# Patient Record
Sex: Male | Born: 1940
Health system: Southern US, Community
[De-identification: ages and names within clinical notes are randomized; demographics above are authoritative.]

## PROBLEM LIST (undated history)

## (undated) ENCOUNTER — Emergency Department (HOSPITAL_COMMUNITY): Admission: EM | Payer: Medicare Other

## (undated) DIAGNOSIS — I252 Old myocardial infarction: Secondary | ICD-10-CM

## (undated) DIAGNOSIS — I10 Essential (primary) hypertension: Secondary | ICD-10-CM

## (undated) DIAGNOSIS — R911 Solitary pulmonary nodule: Secondary | ICD-10-CM

## (undated) DIAGNOSIS — E785 Hyperlipidemia, unspecified: Secondary | ICD-10-CM

## (undated) DIAGNOSIS — K219 Gastro-esophageal reflux disease without esophagitis: Secondary | ICD-10-CM

## (undated) DIAGNOSIS — Z85820 Personal history of malignant melanoma of skin: Secondary | ICD-10-CM

## (undated) DIAGNOSIS — Z87440 Personal history of urinary (tract) infections: Secondary | ICD-10-CM

## (undated) DIAGNOSIS — N419 Inflammatory disease of prostate, unspecified: Secondary | ICD-10-CM

## (undated) DIAGNOSIS — M19049 Primary osteoarthritis, unspecified hand: Secondary | ICD-10-CM

## (undated) DIAGNOSIS — Z9289 Personal history of other medical treatment: Secondary | ICD-10-CM

## (undated) DIAGNOSIS — I219 Acute myocardial infarction, unspecified: Secondary | ICD-10-CM

## (undated) DIAGNOSIS — C4490 Unspecified malignant neoplasm of skin, unspecified: Secondary | ICD-10-CM

## (undated) DIAGNOSIS — IMO0002 Reserved for concepts with insufficient information to code with codable children: Secondary | ICD-10-CM

## (undated) DIAGNOSIS — Z8489 Family history of other specified conditions: Secondary | ICD-10-CM

## (undated) DIAGNOSIS — Z85828 Personal history of other malignant neoplasm of skin: Secondary | ICD-10-CM

## (undated) DIAGNOSIS — Z87442 Personal history of urinary calculi: Secondary | ICD-10-CM

## (undated) DIAGNOSIS — Z9889 Other specified postprocedural states: Secondary | ICD-10-CM

## (undated) DIAGNOSIS — N529 Male erectile dysfunction, unspecified: Secondary | ICD-10-CM

## (undated) DIAGNOSIS — R51 Headache: Secondary | ICD-10-CM

## (undated) DIAGNOSIS — G4733 Obstructive sleep apnea (adult) (pediatric): Secondary | ICD-10-CM

## (undated) DIAGNOSIS — R519 Headache, unspecified: Secondary | ICD-10-CM

## (undated) DIAGNOSIS — R112 Nausea with vomiting, unspecified: Secondary | ICD-10-CM

## (undated) DIAGNOSIS — N1330 Unspecified hydronephrosis: Secondary | ICD-10-CM

## (undated) DIAGNOSIS — Z973 Presence of spectacles and contact lenses: Secondary | ICD-10-CM

## (undated) DIAGNOSIS — Z87448 Personal history of other diseases of urinary system: Secondary | ICD-10-CM

## (undated) DIAGNOSIS — R319 Hematuria, unspecified: Secondary | ICD-10-CM

## (undated) DIAGNOSIS — I2510 Atherosclerotic heart disease of native coronary artery without angina pectoris: Secondary | ICD-10-CM

## (undated) DIAGNOSIS — D72819 Decreased white blood cell count, unspecified: Secondary | ICD-10-CM

## (undated) DIAGNOSIS — G8929 Other chronic pain: Secondary | ICD-10-CM

## (undated) DIAGNOSIS — N400 Enlarged prostate without lower urinary tract symptoms: Secondary | ICD-10-CM

## (undated) DIAGNOSIS — G4700 Insomnia, unspecified: Secondary | ICD-10-CM

## (undated) DIAGNOSIS — J309 Allergic rhinitis, unspecified: Secondary | ICD-10-CM

## (undated) DIAGNOSIS — Z789 Other specified health status: Secondary | ICD-10-CM

## (undated) DIAGNOSIS — Z87438 Personal history of other diseases of male genital organs: Secondary | ICD-10-CM

## (undated) DIAGNOSIS — R040 Epistaxis: Secondary | ICD-10-CM

## (undated) HISTORY — DX: Solitary pulmonary nodule: R91.1

## (undated) HISTORY — PX: SEPTOPLASTY WITH ETHMOIDECTOMY, AND MAXILLARY ANTROSTOMY: SHX6090

## (undated) HISTORY — PX: SKIN CANCER EXCISION: SHX779

## (undated) HISTORY — DX: Hyperlipidemia, unspecified: E78.5

## (undated) HISTORY — DX: Male erectile dysfunction, unspecified: N52.9

## (undated) HISTORY — PX: NASAL SINUS SURGERY: SHX719

## (undated) HISTORY — DX: Epistaxis: R04.0

## (undated) HISTORY — PX: KNEE ARTHROSCOPY: SHX127

## (undated) HISTORY — DX: Obstructive sleep apnea (adult) (pediatric): G47.33

## (undated) HISTORY — DX: Personal history of other medical treatment: Z92.89

## (undated) HISTORY — PX: FRONTAL SINUSOTOMY: SUR1296

## (undated) HISTORY — DX: Decreased white blood cell count, unspecified: D72.819

## (undated) HISTORY — PX: BONE CYST EXCISION: SHX376

## (undated) HISTORY — DX: Inflammatory disease of prostate, unspecified: N41.9

## (undated) HISTORY — DX: Primary osteoarthritis, unspecified hand: M19.049

## (undated) HISTORY — DX: Insomnia, unspecified: G47.00

## (undated) HISTORY — DX: Reserved for concepts with insufficient information to code with codable children: IMO0002

## (undated) HISTORY — DX: Allergic rhinitis, unspecified: J30.9

## (undated) HISTORY — DX: Atherosclerotic heart disease of native coronary artery without angina pectoris: I25.10

## (undated) HISTORY — PX: EYE SURGERY: SHX253

---

## 2001-07-08 ENCOUNTER — Encounter: Admission: RE | Admit: 2001-07-08 | Discharge: 2001-07-08 | Payer: Self-pay | Admitting: Rheumatology

## 2001-07-08 ENCOUNTER — Encounter: Payer: Self-pay | Admitting: Rheumatology

## 2001-11-01 ENCOUNTER — Ambulatory Visit (HOSPITAL_COMMUNITY): Admission: RE | Admit: 2001-11-01 | Discharge: 2001-11-01 | Payer: Self-pay | Admitting: Gastroenterology

## 2002-03-21 ENCOUNTER — Encounter: Admission: RE | Admit: 2002-03-21 | Discharge: 2002-03-21 | Payer: Self-pay | Admitting: Rheumatology

## 2002-03-21 ENCOUNTER — Encounter: Payer: Self-pay | Admitting: Rheumatology

## 2002-04-28 ENCOUNTER — Encounter: Admission: RE | Admit: 2002-04-28 | Discharge: 2002-04-28 | Payer: Self-pay | Admitting: Rheumatology

## 2002-04-28 ENCOUNTER — Encounter: Payer: Self-pay | Admitting: Rheumatology

## 2002-05-12 ENCOUNTER — Encounter: Payer: Self-pay | Admitting: Otolaryngology

## 2002-05-12 ENCOUNTER — Encounter: Admission: RE | Admit: 2002-05-12 | Discharge: 2002-05-12 | Payer: Self-pay | Admitting: Otolaryngology

## 2002-06-03 ENCOUNTER — Encounter: Payer: Self-pay | Admitting: Otolaryngology

## 2002-06-03 ENCOUNTER — Encounter: Admission: RE | Admit: 2002-06-03 | Discharge: 2002-06-03 | Payer: Self-pay | Admitting: Otolaryngology

## 2002-09-22 ENCOUNTER — Ambulatory Visit (HOSPITAL_BASED_OUTPATIENT_CLINIC_OR_DEPARTMENT_OTHER): Admission: RE | Admit: 2002-09-22 | Discharge: 2002-09-22 | Payer: Self-pay | Admitting: Otolaryngology

## 2002-09-22 ENCOUNTER — Encounter (INDEPENDENT_AMBULATORY_CARE_PROVIDER_SITE_OTHER): Payer: Self-pay | Admitting: Specialist

## 2003-04-09 ENCOUNTER — Ambulatory Visit (HOSPITAL_COMMUNITY): Admission: RE | Admit: 2003-04-09 | Discharge: 2003-04-09 | Payer: Self-pay | Admitting: Neurology

## 2003-04-09 ENCOUNTER — Encounter: Payer: Self-pay | Admitting: Neurology

## 2003-07-09 ENCOUNTER — Encounter: Admission: RE | Admit: 2003-07-09 | Discharge: 2003-07-09 | Payer: Self-pay | Admitting: Internal Medicine

## 2003-07-21 ENCOUNTER — Encounter: Admission: RE | Admit: 2003-07-21 | Discharge: 2003-07-21 | Payer: Self-pay | Admitting: Colon and Rectal Surgery

## 2003-07-21 ENCOUNTER — Encounter: Payer: Self-pay | Admitting: Otolaryngology

## 2003-07-23 ENCOUNTER — Encounter (INDEPENDENT_AMBULATORY_CARE_PROVIDER_SITE_OTHER): Payer: Self-pay | Admitting: Specialist

## 2003-07-23 ENCOUNTER — Ambulatory Visit (HOSPITAL_BASED_OUTPATIENT_CLINIC_OR_DEPARTMENT_OTHER): Admission: RE | Admit: 2003-07-23 | Discharge: 2003-07-23 | Payer: Self-pay | Admitting: Otolaryngology

## 2010-05-02 ENCOUNTER — Encounter: Admission: RE | Admit: 2010-05-02 | Discharge: 2010-05-02 | Payer: Self-pay | Admitting: Internal Medicine

## 2011-04-07 NOTE — Op Note (Signed)
NAME:  Sean Hammond, Sean Hammond                            ACCOUNT NO.:  [id number]   MEDICAL RECORD NO.:  [id number]                   PATIENT TYPE:  AMB   LOCATION:  DSC                                  FACILITY:  [facility]   PHYSICIAN:  Jefry H. Pollyann Kennedy, M.D.                DATE OF BIRTH:  [birth year]   DATE OF PROCEDURE:  09/22/2002  DATE OF DISCHARGE:                                 OPERATIVE REPORT   PREOPERATIVE DIAGNOSES:  1. Nasal septal deviation.  2. Chronic ethmoid sinusitis.  3. Chronic maxillary sinusitis.  4. Chronic sinus polyposis.  5. Chronic frontal sinusitis.   POSTOPERATIVE DIAGNOSES:  1. Nasal septal deviation.  2. Chronic ethmoid sinusitis.  3. Chronic maxillary sinusitis.  4. Chronic sinus polyposis.  5. Chronic frontal sinusitis.   OPERATION PERFORMED:  1. Nasal septoplasty.  2. Bilateral endoscopic total ethmoidectomy.  3. Bilateral endoscopic maxillary antrostomy with stripping of maxillary and     sinus mucosa.  4. InstaTrak image guidance system use.  5. Bilateral endoscopic frontal sinusotomy.   SURGEON:  Jefry H. Pollyann Kennedy, M.D.   ANESTHESIA:  General endotracheal.   COMPLICATIONS:  None.   ESTIMATED BLOOD LOSS:  100 cc.   FINDINGS:  Severe nasal septal deviation towards the left side involving the  inferior aspect of the ethmoid plate and the vomerian bone.  Chronic diffuse  ethmoid sinus disease with hyperplastic mucosal changes, polyps and thick  mucopurulent secretions.  Hypoplastic mucosal sinus disease at the maxillary  sinuses bilaterally and again with thickened and hyperplastic and polypoid  changes and mucopurulent secretions.   INDICATIONS FOR PROCEDURE:  The patient is a 70 year old gentleman with an  extensive history of chronic sinus disease not responding to prolonged  antibiotic therapy.  The risks, benefits, alternatives and complications of  the procedure were explained to the patient, who  seemed to understand and  agreed to  surgery.   DESCRIPTION OF PROCEDURE:  The patient was taken to the operating room and  placed on the operating table in supine position.  Following induction of  general endotracheal anesthesia, the face was prepped and draped in standard  fashion.  Afrin spray was used preoperatively in the nasal cavities.  1%  Xylocaine with epinephrine was infiltrated into the septum, the columella  and the superior and posterior attachments of the middle turbinates  bilaterally.  A total of approximately 8 cc was injected.  The InstaTrak  head gear was placed into position and the patient was draped and positioned  for nasal and sinus surgery.   1 - Nasal septoplasty.  Afrin soaked pledgets were placed bilaterally in the  nasal cavities.  The 15 scalpel was used to create a left hemitransfixion  incision which was then used to elevate the mucoperichondrial flaps off the  septal cartilage.  This was dissected all the way back to the sphenoid  rostrum and then  the bony cartilaginous junction was divided.  A similar  flap was developed on the right side.  A tear occurred in the left mucosal  flap but the right side was kept intact.  The superior attachment of the  ethmoid plate was taken down using Jansen-Middleton rongeur.  The ethmoid  plate was thickened and deflected towards the left side.  The majority of  the ethmoid plate was resected.  Part of the vomer that was also deflected  was resected.  The remainder was kept intact.  This nicely improved the  nasal airways bilaterally.  The septal incision was reapproximated with 4-0  chromic suture.  The mucosal flaps were quilted with plain gut.   2 - Bilateral endoscopic total ethmoidectomy.  Using a combination of 0 and  30 degree nasal endoscopes and Insta Track for assistance with guidance and  localization, the uncinate process was taken down with a sickle knife. The  ethmoid bulla was exposed bilaterally and was taken down using the straight   suction and the microdebrider.  The microdebrider was then used to perform a  complete ethmoidectomy.  The lamina papyracea was the lateral limit of  dissection and was kept intact.  The ethmoid roof and fovea ethmoidalis was  the superior limit and was also kept intact without any dehiscences.  A  large amount of hyperplastic mucosa and some polypoid disease was removed  during this.  The ground lamella was taken down to enter into the posterior  ethmoid cells.  A complete ethmoidectomy was performed including the  hyperplastic tissue in the frontal recess area.   3 - Bilateral endoscopic maxillary antrostomy with removal of maxillary  sinus mucosa.  Using 30 degree nasal endoscope and curved suction, the  fontanelle was inspected bilaterally.  There was no natural ostium  identified.  Suction was entered in through the fontanelle into the sinus  and a large amount of secretions and polypoid mucosa were identified.  The  ostium was enlarged with backbiting forceps and through cut forceps.  Hyperplastic mucosa was removed bilaterally using upbiting Wild- Blakesley  forceps.  The maxillary sinuses were then packed with Cortisporin ointment.   4 - Bilateral endoscopic frontal sinusotomy.  Following the anterior aspect  of the ethmoidectomy a curved suction was used to enter into the frontal  recess area bilaterally and into the frontal sinus.  The sinuses were filled  with inspissated mucus and the ostia were obstructed by hyperplastic and  polypoid mucosa.  Some of the mucosa was removed but some was left in place  in order to not strip the entire circumferential lining of the duct area.  The frontal sinuses were then packed with Cortisporin ointment.   5 - Land system.  Due to the extensive nature of the  polypoid disease and the need to open up the frontal sinuses bilaterally, the Insta Track was helpful and necessary for visualization of the roof of  the ethmoid  and the entrance into the frontal sinuses.  Also this was of  great help with the posterior aspect of the ethmoidectomy because of  hyperplastic changes that made it difficult to tell for sure where the  posterior cells were versus the roof of the ethmoid.  Bilateral Kennedy  packs were placed in the ethmoid cavities.  The nasal cavities were packed  with rolled up Telfa and coated with bacitracin ointment.  The pharynx was  suctioned of blood and secretions.  The patient was then awakened,  extubated  and transferred to recovery in good condition.                                                Jefry H. Pollyann Kennedy, M.D.   JHR/MEDQ  D:  09/22/2002  T:  09/22/2002  Job:  [id number]

## 2011-04-07 NOTE — Procedures (Signed)
[place]  Patient:    Sean Hammond, Sean Hammond Visit Number: [id number] MRN: [id number]          Service Type: Attending:  Verlin Grills, M.D. Dictated by:   Verlin Grills, M.D. Proc. Date: 11/01/01   CC:         Demetria Pore. Coral Spikes, M.D.   Procedure Report  REFERRING PHYSICIAN:  Demetria Pore. Coral Spikes, M.D.  PROCEDURE INDICATION:  Mr. Sean Hammond (date of birth 01-21-1941) is a 70 year old male, who is referred for diagnostic colonoscopy with polypectomy to evaluate intermittent painless hematochezia.  ENDOSCOPIST:  Charolett Bumpers, M.D.  PREMEDICATION:  Versed 10 mg, Demerol 50 mg.  ENDOSCOPE:  Olympus pediatric colonoscope.  PROCEDURE:  After obtaining informed consent, Mr. Oscar was placed in the left lateral decubitus position.  I administered intravenous Demerol and intravenous Versed to achieve conscious sedation for the procedure.  The patients blood pressure, oxygen saturation and cardiac rhythm were monitored throughout the procedure and documented in the medical record.  Anal inspection was normal.  Digital rectal examination revealed a nonnodular prostate.  The Olympus pediatric video colonoscope was introduced into the rectum and easily advanced to the cecum.  Colonic preparation for the examination today was excellent.  RECTUM:  Normal.  SIGMOID COLON/DESCENDING COLON:  Normal.  SPLENIC FLEXURE:  Normal.  TRANSVERSE COLON:  Normal.  HEPATIC FLEXURE:  Normal.  ASCENDING COLON:  Normal.  CECUM/ILEOCECAL VALVE:  Normal.  ASSESSMENT:  Normal proctocolonoscopy to the cecum.  No endoscopic evidence for the presence of colorectal neoplasia. Dictated by:   Verlin Grills, M.D. Attending:  Verlin Grills, M.D. DD:  11/01/01 TD:  11/01/01 Job: [id number]

## 2011-04-07 NOTE — Op Note (Signed)
NAME:  Sean Hammond, Sean Hammond                            ACCOUNT NO.:  [id number]   MEDICAL RECORD NO.:  [id number]                   PATIENT TYPE:  AMB   LOCATION:  DSC                                  FACILITY:  [facility]   PHYSICIAN:  Jefry H. Pollyann Kennedy, M.D.                DATE OF BIRTH:  [birth year]   DATE OF PROCEDURE:  07/23/2003  DATE OF DISCHARGE:                                 OPERATIVE REPORT   PREOPERATIVE DIAGNOSIS:  Chronic frontal sinusitis.   POSTOPERATIVE DIAGNOSIS:  Chronic frontal sinusitis.   OPERATION PERFORMED:  Revision endoscopic bilateral frontal sinusotomy.   SURGEON:  Jefry H. Pollyann Kennedy, M.D.   ANESTHESIA:  General endotracheal.   COMPLICATIONS:  None.   ESTIMATED BLOOD LOSS:  30mL.   OPERATIVE FINDINGS:  Lateralized middle turbinates bilaterally, minimal to  moderate polypoid tissue in the frontal recess bilaterally.   DISPOSITION:  The patient tolerated the procedure well, was awakened,  extubated and transferred to recovery in stable condition.   INDICATIONS FOR PROCEDURE:  The patient is a 70 year old gentleman who  underwent extensive endoscopic sinus surgery several months back for chronic  sinus disease.  He has had persistent chronic bronchitis and chronic frontal  sinus disease since the surgery.  He has been on multiple antibiotics,  multiple other medications for hyperactive airway disease with moderate  control of symptoms.  The risks, benefits, alternatives and complications of  the procedure were explained to the patient, who seemed to understand and  agreed to surgery.   DESCRIPTION OF PROCEDURE:  The patient was taken to the operating room and  placed on the operating table in supine position.  Following induction of  general endotracheal anesthesia, the patient was prepped and draped in  standard fashion.  Oxymetazoline spray was used preoperatively in the nasal  cavities.  1% Xylocaine with epinephrine was infiltrated into the superior  and  posterior attachment to the middle turbinate and the lateral nasal wall.  A total of about 8mL was injected.   (1)  Bilateral endoscopic middle turbinate resection.  Using 0 degree nasal  endoscopes, the straight through-cut forcep was used to resect the anterior  and inferior aspect of the middle turbinates bilaterally.  There was minimal  bleeding. This opened up nicely the ethmoid complex and the frontal recess  area.  The remnant was left intact for possible future necessity if he  should require further surgery.  (2)  Revision frontal sinusotomy.  Using InstaTrak guidance, the 30 and 70  degree nasal endoscopes were used to visualize the frontal recess area.  The  microdebrider was used to remove any synechiae or polypoid tissue that was  present in the frontal recess and the anterior ethmoid complex bilaterally.  There was no infection noted.  Minimal polypoid disease.  There was no  purulent secretions identified.  The frontal sinuses were opened widely  bilaterally.  A  curved microdebrider tip was also used to clean out the  inferior frontal sinuses bilaterally.  The frontal sinuses were packed with  Cortisporin ointment bilaterally.  (3) InstaTrak image guided system use.  Given the revision nature and the  frontal sinus disease, the decision was made preoperatively to utilize  [employer] guidance system use.  At the beginning of the procedure, the  head gear was placed in position.  The straight suction probe was calibrated  and positioning was verified.  This was of significant utility in safely  identifying the opening to the frontal sinuses and to make sure there was no  persistent disease in addition to the frontal recess bilaterally.  It also  helped with making sure that the frontal sinus was completely open on both  sides.  The pharynx was suctioned of blood and secretions.  The patient was  awakened, extubated and transferred to recovery.                                                  Jefry H. Pollyann Kennedy, M.D.    JHR/MEDQ  D:  07/23/2003  T:  07/23/2003  Job:  [id number]   cc:   Charlaine Dalton. Sherene Sires, M.D. [facility]   Demetria Pore. Coral Spikes, M.D.  [address]  [address]  [place]  [place] [address]  Fax: [phone]

## 2011-04-26 ENCOUNTER — Other Ambulatory Visit: Payer: Self-pay | Admitting: Internal Medicine

## 2011-04-26 DIAGNOSIS — R0789 Other chest pain: Secondary | ICD-10-CM

## 2011-04-28 ENCOUNTER — Ambulatory Visit
Admission: RE | Admit: 2011-04-28 | Discharge: 2011-04-28 | Disposition: A | Discharge status: Home / Self Care | Payer: Medicare Other | Source: Ambulatory Visit | Attending: Internal Medicine | Admitting: Internal Medicine

## 2011-04-28 DIAGNOSIS — R0789 Other chest pain: Secondary | ICD-10-CM

## 2011-09-07 ENCOUNTER — Other Ambulatory Visit: Payer: Self-pay | Admitting: Internal Medicine

## 2011-09-07 DIAGNOSIS — D386 Neoplasm of uncertain behavior of respiratory organ, unspecified: Secondary | ICD-10-CM

## 2011-09-12 ENCOUNTER — Ambulatory Visit
Admission: RE | Admit: 2011-09-12 | Discharge: 2011-09-12 | Disposition: A | Discharge status: Home / Self Care | Payer: Medicare Other | Source: Ambulatory Visit | Attending: Internal Medicine | Admitting: Internal Medicine

## 2011-09-12 DIAGNOSIS — D386 Neoplasm of uncertain behavior of respiratory organ, unspecified: Secondary | ICD-10-CM

## 2011-09-12 MED ORDER — IOHEXOL 300 MG/ML  SOLN
75.0000 mL | Freq: Once | INTRAMUSCULAR | Status: AC | PRN
  Administered 2011-09-12: 75 mL via INTRAVENOUS

## 2011-11-24 DIAGNOSIS — R51 Headache: Secondary | ICD-10-CM | POA: Diagnosis not present

## 2011-11-24 DIAGNOSIS — R972 Elevated prostate specific antigen [PSA]: Secondary | ICD-10-CM | POA: Diagnosis not present

## 2011-11-24 DIAGNOSIS — Z0000 Encounter for general adult medical examination without abnormal findings: Secondary | ICD-10-CM | POA: Diagnosis not present

## 2011-11-24 DIAGNOSIS — R5383 Other fatigue: Secondary | ICD-10-CM | POA: Diagnosis not present

## 2011-11-24 DIAGNOSIS — R5381 Other malaise: Secondary | ICD-10-CM | POA: Diagnosis not present

## 2011-11-24 DIAGNOSIS — Z79899 Other long term (current) drug therapy: Secondary | ICD-10-CM | POA: Diagnosis not present

## 2011-11-24 DIAGNOSIS — K219 Gastro-esophageal reflux disease without esophagitis: Secondary | ICD-10-CM | POA: Diagnosis not present

## 2011-11-24 DIAGNOSIS — R071 Chest pain on breathing: Secondary | ICD-10-CM | POA: Diagnosis not present

## 2011-11-30 DIAGNOSIS — R9430 Abnormal result of cardiovascular function study, unspecified: Secondary | ICD-10-CM | POA: Diagnosis not present

## 2011-12-07 ENCOUNTER — Other Ambulatory Visit: Payer: Self-pay | Admitting: Cardiology

## 2011-12-07 DIAGNOSIS — R9430 Abnormal result of cardiovascular function study, unspecified: Secondary | ICD-10-CM | POA: Diagnosis not present

## 2011-12-07 DIAGNOSIS — R0602 Shortness of breath: Secondary | ICD-10-CM | POA: Diagnosis not present

## 2011-12-07 DIAGNOSIS — R51 Headache: Secondary | ICD-10-CM | POA: Diagnosis not present

## 2011-12-07 DIAGNOSIS — E782 Mixed hyperlipidemia: Secondary | ICD-10-CM | POA: Diagnosis not present

## 2011-12-07 DIAGNOSIS — R071 Chest pain on breathing: Secondary | ICD-10-CM | POA: Diagnosis not present

## 2011-12-08 ENCOUNTER — Encounter (HOSPITAL_COMMUNITY): Payer: Self-pay | Admitting: Pharmacy Technician

## 2011-12-09 ENCOUNTER — Ambulatory Visit
Admission: RE | Admit: 2011-12-09 | Discharge: 2011-12-09 | Disposition: A | Discharge status: Home / Self Care | Payer: Medicare Other | Source: Ambulatory Visit | Attending: Cardiology | Admitting: Cardiology

## 2011-12-09 ENCOUNTER — Other Ambulatory Visit: Payer: Medicare Other

## 2011-12-09 DIAGNOSIS — R51 Headache: Secondary | ICD-10-CM | POA: Diagnosis not present

## 2011-12-09 DIAGNOSIS — I6789 Other cerebrovascular disease: Secondary | ICD-10-CM | POA: Diagnosis not present

## 2011-12-09 DIAGNOSIS — G319 Degenerative disease of nervous system, unspecified: Secondary | ICD-10-CM | POA: Diagnosis not present

## 2011-12-09 MED ORDER — GADOBENATE DIMEGLUMINE 529 MG/ML IV SOLN
20.0000 mL | Freq: Once | INTRAVENOUS | Status: AC | PRN
  Administered 2011-12-09: 20 mL via INTRAVENOUS

## 2011-12-12 ENCOUNTER — Other Ambulatory Visit: Payer: Medicare Other

## 2011-12-13 ENCOUNTER — Other Ambulatory Visit: Payer: Self-pay | Admitting: Cardiology

## 2011-12-14 ENCOUNTER — Ambulatory Visit (HOSPITAL_COMMUNITY)
Admission: RE | Admit: 2011-12-14 | Discharge: 2011-12-15 | Disposition: A | Discharge status: Home / Self Care | Payer: Medicare Other | Source: Ambulatory Visit | Attending: Cardiology | Admitting: Cardiology

## 2011-12-14 ENCOUNTER — Encounter (HOSPITAL_COMMUNITY): Payer: Self-pay | Admitting: General Practice

## 2011-12-14 ENCOUNTER — Encounter (HOSPITAL_COMMUNITY)
Admission: RE | Disposition: A | Discharge status: Home / Self Care | Payer: Self-pay | Source: Ambulatory Visit | Attending: Cardiology

## 2011-12-14 DIAGNOSIS — I2510 Atherosclerotic heart disease of native coronary artery without angina pectoris: Secondary | ICD-10-CM | POA: Insufficient documentation

## 2011-12-14 DIAGNOSIS — I201 Angina pectoris with documented spasm: Secondary | ICD-10-CM | POA: Diagnosis not present

## 2011-12-14 DIAGNOSIS — R0609 Other forms of dyspnea: Secondary | ICD-10-CM | POA: Insufficient documentation

## 2011-12-14 DIAGNOSIS — R51 Headache: Secondary | ICD-10-CM | POA: Insufficient documentation

## 2011-12-14 DIAGNOSIS — R0602 Shortness of breath: Secondary | ICD-10-CM | POA: Diagnosis not present

## 2011-12-14 DIAGNOSIS — E785 Hyperlipidemia, unspecified: Secondary | ICD-10-CM | POA: Diagnosis not present

## 2011-12-14 DIAGNOSIS — I25110 Atherosclerotic heart disease of native coronary artery with unstable angina pectoris: Secondary | ICD-10-CM

## 2011-12-14 DIAGNOSIS — R9430 Abnormal result of cardiovascular function study, unspecified: Secondary | ICD-10-CM | POA: Diagnosis not present

## 2011-12-14 DIAGNOSIS — R0989 Other specified symptoms and signs involving the circulatory and respiratory systems: Secondary | ICD-10-CM | POA: Insufficient documentation

## 2011-12-14 HISTORY — DX: Other chronic pain: G89.29

## 2011-12-14 HISTORY — PX: CORONARY ANGIOPLASTY WITH STENT PLACEMENT: SHX49

## 2011-12-14 HISTORY — PX: PERCUTANEOUS CORONARY STENT INTERVENTION (PCI-S): SHX5485

## 2011-12-14 HISTORY — DX: Acute myocardial infarction, unspecified: I21.9

## 2011-12-14 HISTORY — DX: Unspecified malignant neoplasm of skin, unspecified: C44.90

## 2011-12-14 HISTORY — DX: Headache: R51

## 2011-12-14 HISTORY — PX: LEFT HEART CATHETERIZATION WITH CORONARY ANGIOGRAM: SHX5451

## 2011-12-14 HISTORY — DX: Gastro-esophageal reflux disease without esophagitis: K21.9

## 2011-12-14 HISTORY — DX: Headache, unspecified: R51.9

## 2011-12-14 LAB — POCT ACTIVATED CLOTTING TIME
Activated Clotting Time: 221 seconds
Activated Clotting Time: 270 seconds

## 2011-12-14 SURGERY — LEFT HEART CATHETERIZATION WITH CORONARY ANGIOGRAM
Anesthesia: LOCAL

## 2011-12-14 MED ORDER — VERAPAMIL HCL 2.5 MG/ML IV SOLN
INTRAVENOUS | Status: AC
  Filled 2011-12-14: qty 2

## 2011-12-14 MED ORDER — EPTIFIBATIDE 75 MG/100ML IV SOLN
INTRAVENOUS | Status: AC
  Administered 2011-12-14: 2 ug/kg/min via INTRAVENOUS
  Filled 2011-12-14: qty 100

## 2011-12-14 MED ORDER — LIDOCAINE HCL (PF) 1 % IJ SOLN
INTRAMUSCULAR | Status: AC
  Filled 2011-12-14: qty 30

## 2011-12-14 MED ORDER — CLOPIDOGREL BISULFATE 75 MG PO TABS
75.0000 mg | ORAL_TABLET | Freq: Every day | ORAL | Status: DC
  Administered 2011-12-15: 75 mg via ORAL
  Filled 2011-12-14: qty 1

## 2011-12-14 MED ORDER — DIAZEPAM 5 MG PO TABS
5.0000 mg | ORAL_TABLET | ORAL | Status: AC
  Administered 2011-12-14: 5 mg via ORAL
  Filled 2011-12-14: qty 1

## 2011-12-14 MED ORDER — HEPARIN SODIUM (PORCINE) 1000 UNIT/ML IJ SOLN
INTRAMUSCULAR | Status: AC
  Filled 2011-12-14: qty 1

## 2011-12-14 MED ORDER — PANTOPRAZOLE SODIUM 40 MG PO TBEC
40.0000 mg | DELAYED_RELEASE_TABLET | Freq: Every day | ORAL | Status: DC
  Administered 2011-12-14: 40 mg via ORAL
  Filled 2011-12-14 (×2): qty 1

## 2011-12-14 MED ORDER — BIVALIRUDIN 250 MG IV SOLR
INTRAVENOUS | Status: AC
  Filled 2011-12-14: qty 250

## 2011-12-14 MED ORDER — ACETAMINOPHEN 325 MG PO TABS
650.0000 mg | ORAL_TABLET | Freq: Four times a day (QID) | ORAL | Status: DC | PRN
  Administered 2011-12-14: 650 mg via ORAL

## 2011-12-14 MED ORDER — SODIUM CHLORIDE 0.9 % IV SOLN
1.0000 mL/kg/h | INTRAVENOUS | Status: AC
  Administered 2011-12-14: 1 mL/kg/h via INTRAVENOUS

## 2011-12-14 MED ORDER — MIDAZOLAM HCL 2 MG/2ML IJ SOLN
INTRAMUSCULAR | Status: AC
  Filled 2011-12-14: qty 2

## 2011-12-14 MED ORDER — MORPHINE SULFATE 4 MG/ML IJ SOLN: 1.0000 mg | INTRAMUSCULAR | Status: DC | PRN

## 2011-12-14 MED ORDER — SODIUM CHLORIDE 0.9 % IJ SOLN: 3.0000 mL | Freq: Two times a day (BID) | INTRAMUSCULAR | Status: DC

## 2011-12-14 MED ORDER — FENTANYL CITRATE 0.05 MG/ML IJ SOLN
INTRAMUSCULAR | Status: AC
  Filled 2011-12-14: qty 2

## 2011-12-14 MED ORDER — CLOPIDOGREL BISULFATE 300 MG PO TABS
ORAL_TABLET | ORAL | Status: AC
  Filled 2011-12-14: qty 2

## 2011-12-14 MED ORDER — ONDANSETRON HCL 4 MG/2ML IJ SOLN: 4.0000 mg | Freq: Four times a day (QID) | INTRAMUSCULAR | Status: DC | PRN

## 2011-12-14 MED ORDER — HEPARIN (PORCINE) IN NACL 2-0.9 UNIT/ML-% IJ SOLN
INTRAMUSCULAR | Status: AC
  Filled 2011-12-14: qty 2000

## 2011-12-14 MED ORDER — ASPIRIN 81 MG PO CHEW
324.0000 mg | CHEWABLE_TABLET | ORAL | Status: AC
  Administered 2011-12-14: 324 mg via ORAL
  Filled 2011-12-14: qty 4

## 2011-12-14 MED ORDER — NITROGLYCERIN 0.2 MG/ML ON CALL CATH LAB
INTRAVENOUS | Status: AC
  Filled 2011-12-14: qty 1

## 2011-12-14 MED ORDER — CLOPIDOGREL BISULFATE 300 MG PO TABS
600.0000 mg | ORAL_TABLET | Freq: Every day | ORAL | Status: DC
  Administered 2011-12-14: 600 mg via ORAL

## 2011-12-14 MED ORDER — SODIUM CHLORIDE 0.9 % IV SOLN
INTRAVENOUS | Status: DC
  Administered 2011-12-14: 22:00:00 via INTRAVENOUS

## 2011-12-14 MED ORDER — SODIUM CHLORIDE 0.9 % IV SOLN
INTRAVENOUS | Status: DC
  Administered 2011-12-14: 09:00:00 via INTRAVENOUS

## 2011-12-14 MED ORDER — EPTIFIBATIDE 75 MG/100ML IV SOLN
INTRAVENOUS | Status: AC
  Filled 2011-12-14: qty 100

## 2011-12-14 MED ORDER — SODIUM CHLORIDE 0.9 % IJ SOLN: 3.0000 mL | INTRAMUSCULAR | Status: DC | PRN

## 2011-12-14 MED ORDER — SODIUM CHLORIDE 0.9 % IV SOLN: 250.0000 mL | INTRAVENOUS | Status: DC | PRN

## 2011-12-14 MED ORDER — EPTIFIBATIDE 75 MG/100ML IV SOLN
2.0000 ug/kg/min | INTRAVENOUS | Status: AC
  Administered 2011-12-14 (×2): 2 ug/kg/min via INTRAVENOUS
  Filled 2011-12-14 (×2): qty 100

## 2011-12-14 NOTE — Op Note (Signed)
PROCEDURE:   PCI of the mid left circumflex.  INDICATIONS:  Abnormal stress test, shortness of breath.    The distal vessel filled by collaterals. The risks, benefits, and details of the procedure were explained to the patient.  The patient verbalized understanding and wanted to proceed.  Informed written consent was obtained.    PROCEDURE TECHNIQUE:   Dr. Anne Fu performed a diagnostic catheterization revealing an occluded mid left circumflex.  Heparin was used for anticoagulation.  Integrilin was added after the wire was across the occlusion.  CONTRAST:  Total of 80 cc.  COMPLICATIONS:  None.      PCI NARRATIVE: A 5 French EBU 3.0 guiding catheter is used to engage the left main.  A pro water  was inserted into the LAD system to help with guide support.  A BMW wire was placed across the lesion in the midcircumflex.  A 2.0 x 30 emerged balloon was used to predilate the lesion.  This was inflated to 10 atmospheres.  TIMI-3 flow was restored with this.  A 2.25 x 32 Promus stent was then used to stent the distal area of disease.  This was deployed to 12 atmospheres.  This was not long enough to cover the entire area disease.  A  2.25 x 12 Promus element stent was placed in overlapping fashion proximal to the recently placed stent this was deployed at 12 atmospheres.  A 2.5 x 20 and cecotomy pexed balloon was then used to post dilate the entire stented area.  It was inflated at 18 atmospheres.  There is no residual stenosis.  TIMI-3 flow was restored.  Intracoronary nitroglycerin was administered to treat spasm in the distal circumflex territory successfully.  Lesion length was 40 mm.  Initial TIMI 0 flow.  At the end of case, TIMI-3 flow was restored.  IMPRESSIONS:  1. Successful PCI of the mid circumflex with overlapping drug-eluting stents.  RECOMMENDATION:  He will need to continue on dual antiplatelet therapy for at least a year.  Will continue Integrilin for 12 hours.  Continue aggressive  secondary prevention.

## 2011-12-14 NOTE — Op Note (Signed)
PROCEDURE:  Left heart catheterization with selective coronary angiography, left ventriculogram via the radial artery approach.  INDICATIONS:  71 year old with recent increased dyspnea on exertion and abnormal nuclear stress test showing basal inferior wall ischemia.  The risks, benefits, and details of the procedure were explained to the patient, including possibilities of stroke, heart attack, death, renal impairment, arterial damage, bleeding.  The patient verbalized understanding and wanted to proceed.  Informed written consent was obtained.  PROCEDURE TECHNIQUE:  Anav's test was performed pre-and post procedure and was normal. The right radial artery site was prepped and draped in a sterile fashion. One percent lidocaine was used for local anesthesia. Using the modified Seldinger technique a 5 French hydrophilic sheath was inserted into the radial artery without difficulty. 3 mg of verapamil was administered via the sheath. A Judkins right #4 catheter with the guidance of a Versicore wire was placed in the right coronary cusp and selectively cannulated the right coronary artery. After traversing the aortic arch, 4000 units of heparin IV was administered. A Judkins left #3.5 catheter was used to selectively cannulate the left main artery. Multiple views with hand injection of Omnipaque were obtained. Intracoronary nitroglycerin was administered 200 mcg in the left coronary system. Catheter a pigtail catheter was used to cross into the left ventricle, hemodynamics were obtained, and a left ventriculogram was performed in the RAO position with power injection. Following the procedure, sheath was removed, patient was hemodynamically stable, hemostasis was maintained with a Terumo T band.   CONTRAST:  Total of 90 ml.    FLOUROSCOPY TIME: 2.0 min.  COMPLICATIONS:  None.    HEMODYNAMICS:  Aortic pressure was 120/59mmHg; LV systolic pressure was ; LVEDP .  There was no gradient between the  left ventricle and aorta.    ANGIOGRAPHIC DATA:    Left main: Branches into the LAD and circumflex artery. No angiographically significant disease.  Left anterior descending (LAD): 1 moderate-sized diagonal branch with mild stenosis at the ostium/branch point. Stenosis is approximately 20-30%. Nonflow limiting.  Circumflex artery (CIRC): The AV groove circumflex has a 100% occlusion, chronic total, with retrograde flow distally and a small to moderate size vessel. Second obtuse marginal branch has minor luminal irregularities.  Right coronary artery (RCA): Dominant vessel giving rise to PDA. At the distal PDA, there is an abrupt end with a branch point. No flow limiting disease.  LEFT VENTRICULOGRAM:  Left ventricular angiogram was done in the 30 RAO projection and revealed normal left ventricular estimated ejection fraction of 55 %. There is mild inferior wall/inferobasal hypokinesis.  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 Minor luminal irregularities elsewhere Normal left ventricular systolic function with basal inferior wall hypokinesis.  LVEDP 8 mmHg.  Ejection fraction 55%.  RECOMMENDATION:  I discussed the case with Dr. Eldridge Dace as well as Mr. Saldierna. We will proceed with attempt to open occluded vessel. I discussed the risks and benefits with he and his family including possibility of emergency bypass, bleeding, stroke, heart attack, death.

## 2011-12-14 NOTE — H&P (View-Only) (Signed)
Patient: Sean Hammond, Sean Hammond Provider: Mark Skains, MD  DOB: 07/11/1941   Age: 71 Y   Sex: Male Date: 12/07/2011  Phone: [phone]  Address: [address], [place], [place]  Pcp: ROBERT N GATES    --------------------------------------------------------------------------------  Subjective:    CC:      1. MS/f/u to stress test.      HPI:     General:           71-year-old male with hyperlipidemia, ED who recently underwent nuclear stress test because of exertional fatigue with the following results: Mildly abnormal nuclear stress test with basal inferior/mid inferior wall mild ischemia, ejection fraction 44% calculated, 1 mm ST segment depression in V5. Normal blood pressure response. He had been having pain between his shoulder blades different with movement. CT scan done. Right low flank as well. Continued to have issues. Also new HA. he states his headaches can be quite severe both frontal and occipital and at times wake him up from sleep. He has tried prednisone and this has not helped.Two prior sinus surgeries. Feels winded when going up hill more winded than usual. Occasional mild pressure in mid upper chest. No DM. No tob, quit 40 years ago. .      ROS:      The other elements of the review of systems are negative (12 total elements).     Medical History: Hyperlipidemia, Idiopathic leukopenia, Headache/neck pain -- rule out osteoarthritis, GERD, ED, Allergic rhinitis, Status post insomnia, status post epigastric and upper abdominal pain 1996 with normal ultrasound. August 2002 abdominal discomfort with upper GI normal, ultrasound old granulomatous disease the spleen and fatty atrophy of pancreas with amylase lipase and chemistries normal. Nexium given and symptoms improved., Epicondylitis 2006 secondary to building a house, status post noncardiac chest pain 1996 an EKG, ultrasound the gallbladder, labs normal or negative., Prostatitis 1970s, Epistaxis 1982, Osteoarthritis hands, Status  post sinusitis, Cut left thumb hunting 2007, Right chest pain syndrome, 2012, left upper lobe lung nodule on CT scanning, 2012.      Surgical History: urethral dilatation 1971 , sinus surgery 2003 and 2004 Dr. Rosen , [facility] , benign skin lesion .      Family History:   One living child. One child died in approximately 2006 from trauma. Another child died at age 9-1/2 from a lightning strike. He does not smoke or drink alcohol. He is a minister. There is a family history of throat cancer.     Social History:      General:          no Smoking, in the past; , quit 1974; 2PPD.          no Alcohol.          Caffeine: yes.          no Recreational drug use.          Children: Lost a daughter a few years ago after she fell and tragically cut her neck on a glass door..          Religion: Christian.          Seat belt use: yes.          Home smoke detector use: yes.      Medications: Multivitamins Tablet as directed , Prilosec 20 MG Capsule Delayed Release 1 capsule prn, Medication List reviewed and reconciled with the patient     Allergies: Percocet: itch.      Objective:    Vitals: Wt 211,   Wt change -1 lb, Ht 71, BMI 29.43, Pulse sitting 88, BP sitting 120/90.     Examination:     General Examination:         GENERAL APPEARANCE alert, oriented, NAD, pleasant.          SKIN: normal, no rash.          HEENT: normal.          HEAD: /AT.          EYES: EOMI, Conjunctiva clear.          NECK: supple, FROM, without evidence of thyromegaly, adenopathy, or bruits, no jugular venous distention (JVD).          LUNGS: clear to auscultation bilaterally, no wheezes, rhonchi, rales, regular breathing rate and effort.          HEART: regular rate and rhythm, no S3, S4, murmur or rub, point of maximul impulse (PMI) normal.          ABDOMEN: soft, non-tender/non-distended, bowel sounds present, no masses palpated, no bruit.          EXTREMITIES: no clubbing, no edema, pulses 2 plus bilaterally.           NEUROLOGIC EXAM: non-focal exam, alert and oriented x 3.          PERIPHERAL PULSES: normal (2+) bilaterally.          LYMPH NODES: no cervical adenopathy.          PSYCH affect normal.            Assessment:    Assessment:  1. Abnormal cardiovascular function study - 794.30 (Primary)   2. Head ache - 784.0   3. Mixed hyperlipidemia - 272.2   4. Chest Wall Pain - 786.52   5. Dyspnea - 786.05     Plan:    1. Abnormal cardiovascular function study        LAB: PT (Prothrombin Time) (005199) (Ordered for 12/07/2011)     Prothrombin Time 10.1 9.1-12.0 - SEC        INR 0.9 0.8-1.2 -               SKAINS,MARK 12/08/2011 04:31:27 PM > ok for procedure        LAB: Basic Metabolic (Ordered for 12/07/2011)     GLUCOSE 87 70-99 - mg/dL        BUN 14 6-26 - mg/dL        CREATININE 1.02 0.60-1.30 - mg/dl        eGFR (NON-AFRICAN AMERICAN) 73 >60 - calc        eGFR (AFRICAN AMERICAN) 88 >60 - calc        SODIUM 140 136-145 - mmol/Hammond        POTASSIUM 4.3 3.5-5.5 - mmol/Hammond        CHLORIDE 104 98-107 - mmol/Hammond        C02 29 22-32 - mg/dL        ANION GAP 10.8 6.0-20.0 - mmol/Hammond        CALCIUM 9.7 8.6-10.3 - mg/dL               SKAINS,MARK 12/08/2011 04:31:27 PM > ok for procedure        LAB: CBC with Diff (Ordered for 12/07/2011)     WBC 7.8 4.0-11.0 - K/ul        RBC 4.78 4.20-5.80 - M/uL        HGB 15.4 13.0-17.0 - g/dL          HCT 44.8 39.0-52.0 - %        MCH 32.3 27.0-33.0 - pg        MPV 7.9 7.5-10.7 - fL        MCV 93.7 80.0-94.0 - fL        MCHC 34.4 32.0-36.0 - g/dL        RDW 13.2 11.5-15.5 - %        PLT 201 150-400 - K/uL        NEUT % 67.3 43.3-71.9 - %        LYMPH% 22.8 16.8-43.5 - %        MONO % 7.8 4.6-12.4 - %        EOS % 1.2 0.0-7.8 - %        BASO % 0.9 0.0-1.0 - %        NEUT # 5.2 1.9-7.2 - K/uL        LYMPH# 1.80 1.10-2.70 - K/uL        MONO # 0.6 0.3-0.8 - K/uL        EOS # 0.1 0.0-0.6 - K/uL        BASO # 0.1 0.0-0.1 - K/uL               SKAINS,MARK  12/08/2011 04:31:27 PM > ok for procedure        LAB: Basic Metabolic (Ordered for 12/07/2011)     GLUCOSE 87 70-99 - mg/dL        BUN 14 6-26 - mg/dL        CREATININE 1.02 0.60-1.30 - mg/dl        eGFR (NON-AFRICAN AMERICAN) 73 >60 - calc        eGFR (AFRICAN AMERICAN) 88 >60 - calc        SODIUM 140 136-145 - mmol/Hammond        POTASSIUM 4.3 3.5-5.5 - mmol/Hammond        CHLORIDE 104 98-107 - mmol/Hammond        C02 29 22-32 - mg/dL        ANION GAP 10.8 6.0-20.0 - mmol/Hammond        CALCIUM 9.7 8.6-10.3 - mg/dL               SKAINS,MARK 12/08/2011 04:31:27 PM > ok for procedure        LAB: PT (Prothrombin Time) (005199) (Ordered for 12/07/2011)     Prothrombin Time 10.1 9.1-12.0 - SEC        INR 0.9 0.8-1.2 -               SKAINS,MARK 12/08/2011 04:31:27 PM > ok for procedure        LAB: CBC with Diff (Ordered for 12/07/2011)     WBC 7.8 4.0-11.0 - K/ul        RBC 4.78 4.20-5.80 - M/uL        HGB 15.4 13.0-17.0 - g/dL        HCT 44.8 39.0-52.0 - %        MCH 32.3 27.0-33.0 - pg        MPV 7.9 7.5-10.7 - fL        MCV 93.7 80.0-94.0 - fL        MCHC 34.4 32.0-36.0 - g/dL        RDW 13.2 11.5-15.5 - %        PLT 201 150-400 - K/uL        NEUT % 67.3 43.3-71.9 - %          LYMPH% 22.8 16.8-43.5 - %        MONO % 7.8 4.6-12.4 - %        EOS % 1.2 0.0-7.8 - %        BASO % 0.9 0.0-1.0 - %        NEUT # 5.2 1.9-7.2 - K/uL        LYMPH# 1.80 1.10-2.70 - K/uL        MONO # 0.6 0.3-0.8 - K/uL        EOS # 0.1 0.0-0.6 - K/uL        BASO # 0.1 0.0-0.1 - K/uL               SKAINS,MARK 12/08/2011 04:31:27 PM > ok for procedure   I spoke to Dr. Gates on the phone. Given his headache, relatively new onset, awakening him from sleep at times, sister with aneurysm at age 43 I believe, I am going to order an MRA MRI of the brain. Dr. Gates suggested this. If this is normal, we will proceed with heart catheterization via the radial artery approach in the main cath lab. Thursday morning. Risks and benefits of  cardiac catheterization have been reviewed including risk of stroke, heart attack, death, bleeding, renal impariment and arterial damage. There was ample oppurtuny to answer questions. Alternatives were discussed. Patient understands and wishes to proceed.  if symptoms worsen or become more worrisome he is to contact me. If MRI is normal, low-dose aspirin 81 mg.       2. Dyspnea   Abnormal nuclear stress test as above. Proceed with cardiac catheterization.          Immunizations:       Labs:      Procedure Codes: 36415 BLOOD COLLECTION ROUTINE VENIPUNCTURE, 80048 ECL BMP, 85025 ECL CBC PLATELET DIFF     Preventive:           Follow Up: post catheterization        Provider: Mark Skains, MD  Patient: Sean Hammond, Sean Hammond  DOB: 05/26/1941  Date: 12/07/2011   ADDENDUM - MRI/MRA of brain performed and shows no mass or aneurysm. Small vessel vascular changes, age related , noted. Proceeding with cath.   

## 2011-12-14 NOTE — Progress Notes (Signed)
TR BAND REMOVAL  LOCATION:    right radial  DEFLATED PER PROTOCOL:    yes  TIME BAND OFF / DRESSING APPLIED:    1615   SITE UPON ARRIVAL:    Level 0  SITE AFTER BAND REMOVAL:    Level 0  REVERSE Dimas'S TEST:     positive  CIRCULATION SENSATION AND MOVEMENT:    Within Normal Limits   yes  COMMENTS:   Tolerated procedure well 

## 2011-12-14 NOTE — H&P (Signed)
Patient: Sean Hammond, Sean Hammond Provider: Donato Schultz, MD  DOB: 01/30/1941   Age: 71 Y   Sex: Male Date: 12/07/2011  Phone: [phone]  Address: [address], [place], [place]  Pcp: ROBERT N GATES    --------------------------------------------------------------------------------  Subjective:    CC:      1. MS/f/u to stress test.      HPI:     General:           70 year old male with hyperlipidemia, ED who recently underwent nuclear stress test because of exertional fatigue with the following results: Mildly abnormal nuclear stress test with basal inferior/mid inferior wall mild ischemia, ejection fraction 44% calculated, 1 mm ST segment depression in V5. Normal blood pressure response. He had been having pain between his shoulder blades different with movement. CT scan done. Right low flank as well. Continued to have issues. Also new HA. he states his headaches can be quite severe both frontal and occipital and at times wake him up from sleep. He has tried prednisone and this has not helped.Two prior sinus surgeries. Feels winded when going up hill more winded than usual. Occasional mild pressure in mid upper chest. No DM. No tob, quit 40 years ago. .      ROS:      The other elements of the review of systems are negative (12 total elements).     Medical History: Hyperlipidemia, Idiopathic leukopenia, Headache/neck pain -- rule out osteoarthritis, GERD, ED, Allergic rhinitis, Status post insomnia, status post epigastric and upper abdominal pain 1996 with normal ultrasound. August 2002 abdominal discomfort with upper GI normal, ultrasound old granulomatous disease the spleen and fatty atrophy of pancreas with amylase lipase and chemistries normal. Nexium given and symptoms improved., Epicondylitis 04-19-2005 secondary to building a house, status post noncardiac chest pain 1996 an EKG, ultrasound the gallbladder, labs normal or negative., Prostatitis 1970s, Epistaxis 1982, Osteoarthritis hands, Status  post sinusitis, Cut left thumb hunting 04/19/06, Right chest pain syndrome, 20-Apr-2011, left upper lobe lung nodule on CT scanning, 04-20-2011.      Surgical History: urethral dilatation 1971 , sinus surgery 04-19-2002 and 20-Apr-2003 Dr. Pollyann Kennedy , [facility] , benign skin lesion .      Family History:   One living child. One child died in approximately 2005-04-19 from trauma. Another child died at age 29-1/2 from a lightning strike. He does not smoke or drink alcohol. He is a Optician, dispensing. There is a family history of throat cancer.     Social History:      General:          no Smoking, in the past; , quit 1974; 2PPD.          no Alcohol.          Caffeine: yes.          no Recreational drug use.          Children: Lost a daughter a few years ago after she fell and tragically cut her neck on a glass door..          Religion: Christian.          Seat belt use: yes.          Home smoke detector use: yes.      Medications: Multivitamins Tablet as directed , Prilosec 20 MG Capsule Delayed Release 1 capsule prn, Medication List reviewed and reconciled with the patient     Allergies: Percocet: itch.      Objective:    Vitals: Wt 211,  Wt change -1 lb, Ht 71, BMI 29.43, Pulse sitting 88, BP sitting 120/90.     Examination:     General Examination:         GENERAL APPEARANCE alert, oriented, NAD, pleasant.          SKIN: normal, no rash.          HEENT: normal.          HEAD: [place]/AT.          EYES: EOMI, Conjunctiva clear.          NECK: supple, FROM, without evidence of thyromegaly, adenopathy, or bruits, no jugular venous distention (JVD).          LUNGS: clear to auscultation bilaterally, no wheezes, rhonchi, rales, regular breathing rate and effort.          HEART: regular rate and rhythm, no S3, S4, murmur or rub, point of maximul impulse (PMI) normal.          ABDOMEN: soft, non-tender/non-distended, bowel sounds present, no masses palpated, no bruit.          EXTREMITIES: no clubbing, no edema, pulses 2 plus bilaterally.           NEUROLOGIC EXAM: non-focal exam, alert and oriented x 3.          PERIPHERAL PULSES: normal (2+) bilaterally.          LYMPH NODES: no cervical adenopathy.          PSYCH affect normal.            Assessment:    Assessment:  1. Abnormal cardiovascular function study - 794.30 (Primary)   2. Head ache - 784.0   3. Mixed hyperlipidemia - 272.2   4. Chest Wall Pain - 786.52   5. Dyspnea - 786.05     Plan:    1. Abnormal cardiovascular function study        LAB: PT (Prothrombin Time) (643329) (Ordered for 12/07/2011)     Prothrombin Time 10.1 9.1-12.0 - SEC        INR 0.9 0.8-1.2 -               Cabell Lazenby 12/08/2011 04:31:27 PM > ok for procedure        LAB: Basic Metabolic (Ordered for 12/07/2011)     GLUCOSE 87 70-99 - mg/dL        BUN 14 5-18 - mg/dL        CREATININE 8.41 0.60-1.30 - mg/dl        eGFR (NON-AFRICAN AMERICAN) 73 >60 - calc        eGFR (AFRICAN AMERICAN) 88 >60 - calc        SODIUM 140 136-145 - mmol/L        POTASSIUM 4.3 3.5-5.5 - mmol/L        CHLORIDE 104 98-107 - mmol/L        C02 29 22-32 - mg/dL        ANION GAP 66.0 6.3-01.6 - mmol/L        CALCIUM 9.7 8.6-10.3 - mg/dL               [facility] 12/08/2011 04:31:27 PM > ok for procedure        LAB: CBC with Diff (Ordered for 12/07/2011)     WBC 7.8 4.0-11.0 - K/ul        RBC 4.78 4.20-5.80 - M/uL        HGB 15.4 13.0-17.0 - g/dL  HCT 44.8 39.0-52.0 - %        MCH 32.3 27.0-33.0 - pg        MPV 7.9 7.5-10.7 - fL        MCV 93.7 80.0-94.0 - fL        MCHC 34.4 32.0-36.0 - g/dL        RDW 16.1 09.6-04.5 - %        PLT 201 150-400 - K/uL        NEUT % 67.3 43.3-71.9 - %        LYMPH% 22.8 16.8-43.5 - %        MONO % 7.8 4.6-12.4 - %        EOS % 1.2 0.0-7.8 - %        BASO % 0.9 0.0-1.0 - %        NEUT # 5.2 1.9-7.2 - K/uL        LYMPH# 1.80 1.10-2.70 - K/uL        MONO # 0.6 0.3-0.8 - K/uL        EOS # 0.1 0.0-0.6 - K/uL        BASO # 0.1 0.0-0.1 - K/uL               [facility]  12/08/2011 04:31:27 PM > ok for procedure        LAB: Basic Metabolic (Ordered for 12/07/2011)     GLUCOSE 87 70-99 - mg/dL        BUN 14 4-09 - mg/dL        CREATININE 8.11 0.60-1.30 - mg/dl        eGFR (NON-AFRICAN AMERICAN) 73 >60 - calc        eGFR (AFRICAN AMERICAN) 88 >60 - calc        SODIUM 140 136-145 - mmol/L        POTASSIUM 4.3 3.5-5.5 - mmol/L        CHLORIDE 104 98-107 - mmol/L        C02 29 22-32 - mg/dL        ANION GAP 91.4 7.8-29.5 - mmol/L        CALCIUM 9.7 8.6-10.3 - mg/dL               [facility] 12/08/2011 04:31:27 PM > ok for procedure        LAB: PT (Prothrombin Time) (621308) (Ordered for 12/07/2011)     Prothrombin Time 10.1 9.1-12.0 - SEC        INR 0.9 0.8-1.2 -               Farron Watrous 12/08/2011 04:31:27 PM > ok for procedure        LAB: CBC with Diff (Ordered for 12/07/2011)     WBC 7.8 4.0-11.0 - K/ul        RBC 4.78 4.20-5.80 - M/uL        HGB 15.4 13.0-17.0 - g/dL        HCT 65.7 84.6-96.2 - %        MCH 32.3 27.0-33.0 - pg        MPV 7.9 7.5-10.7 - fL        MCV 93.7 80.0-94.0 - fL        MCHC 34.4 32.0-36.0 - g/dL        RDW 95.2 84.1-32.4 - %        PLT 201 150-400 - K/uL        NEUT % 67.3 43.3-71.9 - %  LYMPH% 22.8 16.8-43.5 - %        MONO % 7.8 4.6-12.4 - %        EOS % 1.2 0.0-7.8 - %        BASO % 0.9 0.0-1.0 - %        NEUT # 5.2 1.9-7.2 - K/uL        LYMPH# 1.80 1.10-2.70 - K/uL        MONO # 0.6 0.3-0.8 - K/uL        EOS # 0.1 0.0-0.6 - K/uL        BASO # 0.1 0.0-0.1 - K/uL               G[facility] Onstad 12/08/2011 04:31:27 PM > ok for procedure   I spoke to Dr. Kevan Ny on the phone. Given his headache, relatively new onset, awakening him from sleep at times, sister with aneurysm at age 75 I believe, I am going to order an MRA MRI of the brain. Dr. Kevan Ny suggested this. If this is normal, we will proceed with heart catheterization via the radial artery approach in the main cath lab. Thursday morning. Risks and benefits of  cardiac catheterization have been reviewed including risk of stroke, heart attack, death, bleeding, renal impariment and arterial damage. There was ample oppurtuny to answer questions. Alternatives were discussed. Patient understands and wishes to proceed.  if symptoms worsen or become more worrisome he is to contact me. If MRI is normal, low-dose aspirin 81 mg.       2. Dyspnea   Abnormal nuclear stress test as above. Proceed with cardiac catheterization.          Immunizations:       Labs:      Procedure Codes: 16109 BLOOD COLLECTION ROUTINE VENIPUNCTURE, 80048 ECL BMP, 85025 ECL CBC PLATELET DIFF     Preventive:           Follow Up: post catheterization        Provider: Donato Schultz, MD  Patient: Sean Hammond, Sean Hammond  DOB: 1941/03/16  Date: 12/07/2011   ADDENDUM - MRI/MRA of brain performed and shows no mass or aneurysm. Small vessel vascular changes, age related , noted. Proceeding with cath.

## 2011-12-14 NOTE — Interval H&P Note (Signed)
History and Physical Interval Note:  12/14/2011 11:28 AM  Sean Hammond  has presented today for surgery, with the diagnosis of abnormal stress test  The various methods of treatment have been discussed with the patient and family. After consideration of risks, benefits and other options for treatment, the patient has consented to  Procedure(s): LEFT HEART CATHETERIZATION WITH CORONARY ANGIOGRAM as a surgical intervention .  The patients' history has been reviewed, patient examined, no change in status, stable for surgery.  I have reviewed the patients' chart and labs.  Questions were answered to the patient's satisfaction.     Sean Hammond

## 2011-12-15 ENCOUNTER — Other Ambulatory Visit: Payer: Self-pay

## 2011-12-15 DIAGNOSIS — I2510 Atherosclerotic heart disease of native coronary artery without angina pectoris: Secondary | ICD-10-CM | POA: Diagnosis not present

## 2011-12-15 DIAGNOSIS — R9430 Abnormal result of cardiovascular function study, unspecified: Secondary | ICD-10-CM | POA: Diagnosis not present

## 2011-12-15 LAB — CBC
HCT: 39.5 % (ref 39.0–52.0)
Hemoglobin: 13.7 g/dL (ref 13.0–17.0)
RBC: 4.38 MIL/uL (ref 4.22–5.81)

## 2011-12-15 LAB — BASIC METABOLIC PANEL
CO2: 26 mEq/L (ref 19–32)
Glucose, Bld: 112 mg/dL — ABNORMAL HIGH (ref 70–99)
Potassium: 4.4 mEq/L (ref 3.5–5.1)
Sodium: 140 mEq/L (ref 135–145)

## 2011-12-15 MED ORDER — ASPIRIN 325 MG PO TABS
325.0000 mg | ORAL_TABLET | Freq: Every day | ORAL | Status: DC
  Administered 2011-12-15: 325 mg via ORAL
  Filled 2011-12-15: qty 1

## 2011-12-15 MED ORDER — ATORVASTATIN CALCIUM 40 MG PO TABS: 40.0000 mg | ORAL_TABLET | Freq: Every day | ORAL | Status: DC

## 2011-12-15 MED ORDER — PANTOPRAZOLE SODIUM 40 MG PO TBEC: 40.0000 mg | DELAYED_RELEASE_TABLET | Freq: Every day | ORAL | Status: DC

## 2011-12-15 MED ORDER — CLOPIDOGREL BISULFATE 75 MG PO TABS: 75.0000 mg | ORAL_TABLET | Freq: Every day | ORAL | Status: AC

## 2011-12-15 MED ORDER — ASPIRIN 325 MG PO TABS: 325.0000 mg | ORAL_TABLET | Freq: Every day | ORAL | Status: AC

## 2011-12-15 MED ORDER — NITROGLYCERIN 0.4 MG SL SUBL: 0.4000 mg | SUBLINGUAL_TABLET | SUBLINGUAL | Status: DC | PRN

## 2011-12-15 NOTE — Progress Notes (Signed)
CARDIAC REHAB PHASE I   PRE:  Rate/Rhythm: 83 SR    BP: sitting 132/86    SaO2:   MODE:  Ambulation: 580 ft   POST:  Rate/Rhythm: 100    BP: sitting 136/77     SaO2:   Tolerated well. No c/o. Ed completed. Requests his name be sent to [facility]. [phone]  Harriet Masson CES, ACSM

## 2011-12-15 NOTE — Discharge Summary (Signed)
Patient ID: Sean Hammond MRN: [id number] DOB/AGE: 03-01-1941 71 y.o.  Admit date: 12/14/2011 Discharge date: 12/15/2011  Primary Discharge Diagnosis: CAD post circumflex stent x 2. Secondary Discharge Diagnosis:   1. Significant Diagnostic Studies: Cath - Successful PCI of the mid circumflex with overlapping drug-eluting stents.  Left main: Branches into the LAD and circumflex artery. No angiographically significant disease.  Left anterior descending (LAD): 1 moderate-sized diagonal branch with mild stenosis at the ostium/branch point. Stenosis is approximately 20-30%. Nonflow limiting.  Circumflex artery (CIRC): The AV groove circumflex has a 100% occlusion, chronic total, with retrograde flow distally and a small to moderate size vessel. Second obtuse marginal branch has minor luminal irregularities.  Right coronary artery (RCA): Dominant vessel giving rise to PDA. At the distal PDA, there is an abrupt end with a branch point. No flow limiting disease.  LEFT VENTRICULOGRAM: Left ventricular angiogram was done in the 30 RAO projection and revealed normal left ventricular estimated ejection fraction of 55 %. There is mild inferior wall/inferobasal hypokinesis.  Hospital Course: observed post cath. Did well overnight. Radial. No complaints. Ambulating well. Had HA last night. MRI brain negative as outpatient prior to cath.  PE: normal radial pulse, no hematoma. AAO x 3, RRR, CTAB, soft ab. No edema, Tele normal.    Discharge Exam: Blood pressure 132/86, pulse 80, temperature 97.8 F (36.6 C), temperature source Oral, resp. rate 22, height 6\' 1"  (1.854 m), weight 95.255 kg (210 lb), SpO2 100.00%.    Labs:   Lab Results  Component Value Date   WBC 7.7 12/15/2011   HGB 13.7 12/15/2011   HCT 39.5 12/15/2011   MCV 90.2 12/15/2011   PLT 165 12/15/2011     Lab 12/15/11 0348  NA 140  K 4.4  CL 107  CO2 26  BUN 12  CREATININE 0.97  CALCIUM 9.4  PROT --  BILITOT --  ALKPHOS --  ALT --    AST --  GLUCOSE 112*   FOLLOW UP PLANS AND APPOINTMENTS  Medication List  As of 12/15/2011  8:40 AM   STOP taking these medications         omeprazole 20 MG capsule         TAKE these medications         aspirin 325 MG tablet   Take 1 tablet (325 mg total) by mouth daily.      atorvastatin 40 MG tablet   Commonly known as: LIPITOR   Take 1 tablet (40 mg total) by mouth daily.      clopidogrel 75 MG tablet   Commonly known as: PLAVIX   Take 1 tablet (75 mg total) by mouth daily with breakfast.      JOINT FORMULA PO   Take 1 tablet by mouth 3 (three) times daily.      Multivitamin Liqd   Take 2 oz by mouth daily.      nitroGLYCERIN 0.4 MG SL tablet   Commonly known as: NITROSTAT   Place 1 tablet (0.4 mg total) under the tongue every 5 (five) minutes as needed for chest pain.      pantoprazole 40 MG tablet   Commonly known as: PROTONIX   Take 1 tablet (40 mg total) by mouth daily at 12 noon.           Follow-up Information    Follow up with FERGUSON,CYNTHIA A, NP on 12/21/2011. (1145am)    Contact information:   [facility] [address] [place],  [address] [place] [address] [phone]         Will need to check lipid profile in 3 months.   BRING ALL MEDICATIONS WITH YOU TO FOLLOW UP APPOINTMENTS  Time spent with patient to include physician time:35 min. Spent with med rec, labs, review with family.  SignedDonato Schultz 12/15/2011, 8:40 AM

## 2011-12-21 DIAGNOSIS — I2510 Atherosclerotic heart disease of native coronary artery without angina pectoris: Secondary | ICD-10-CM | POA: Diagnosis not present

## 2011-12-21 DIAGNOSIS — E785 Hyperlipidemia, unspecified: Secondary | ICD-10-CM | POA: Diagnosis not present

## 2011-12-25 DIAGNOSIS — R51 Headache: Secondary | ICD-10-CM | POA: Diagnosis not present

## 2011-12-25 DIAGNOSIS — I2510 Atherosclerotic heart disease of native coronary artery without angina pectoris: Secondary | ICD-10-CM | POA: Diagnosis not present

## 2011-12-25 DIAGNOSIS — K219 Gastro-esophageal reflux disease without esophagitis: Secondary | ICD-10-CM | POA: Diagnosis not present

## 2011-12-25 DIAGNOSIS — R972 Elevated prostate specific antigen [PSA]: Secondary | ICD-10-CM | POA: Diagnosis not present

## 2011-12-25 DIAGNOSIS — R5381 Other malaise: Secondary | ICD-10-CM | POA: Diagnosis not present

## 2011-12-30 DIAGNOSIS — G894 Chronic pain syndrome: Secondary | ICD-10-CM | POA: Diagnosis not present

## 2012-01-02 ENCOUNTER — Other Ambulatory Visit: Payer: Self-pay | Admitting: Orthopedic Surgery

## 2012-01-08 ENCOUNTER — Ambulatory Visit
Admission: RE | Admit: 2012-01-08 | Discharge: 2012-01-08 | Disposition: A | Discharge status: Home / Self Care | Payer: Medicare Other | Source: Ambulatory Visit | Attending: Orthopedic Surgery | Admitting: Orthopedic Surgery

## 2012-01-08 DIAGNOSIS — R109 Unspecified abdominal pain: Secondary | ICD-10-CM | POA: Diagnosis not present

## 2012-01-08 MED ORDER — IOHEXOL 300 MG/ML  SOLN
125.0000 mL | Freq: Once | INTRAMUSCULAR | Status: AC | PRN
  Administered 2012-01-08: 125 mL via INTRAVENOUS

## 2012-01-09 DIAGNOSIS — Z9861 Coronary angioplasty status: Secondary | ICD-10-CM | POA: Diagnosis not present

## 2012-01-09 DIAGNOSIS — Z5189 Encounter for other specified aftercare: Secondary | ICD-10-CM | POA: Diagnosis not present

## 2012-01-09 DIAGNOSIS — I252 Old myocardial infarction: Secondary | ICD-10-CM | POA: Diagnosis not present

## 2012-01-10 DIAGNOSIS — Z5189 Encounter for other specified aftercare: Secondary | ICD-10-CM | POA: Diagnosis not present

## 2012-01-10 DIAGNOSIS — I252 Old myocardial infarction: Secondary | ICD-10-CM | POA: Diagnosis not present

## 2012-01-10 DIAGNOSIS — Z9861 Coronary angioplasty status: Secondary | ICD-10-CM | POA: Diagnosis not present

## 2012-01-12 DIAGNOSIS — I252 Old myocardial infarction: Secondary | ICD-10-CM | POA: Diagnosis not present

## 2012-01-12 DIAGNOSIS — Z5189 Encounter for other specified aftercare: Secondary | ICD-10-CM | POA: Diagnosis not present

## 2012-01-12 DIAGNOSIS — Z9861 Coronary angioplasty status: Secondary | ICD-10-CM | POA: Diagnosis not present

## 2012-01-15 DIAGNOSIS — I252 Old myocardial infarction: Secondary | ICD-10-CM | POA: Diagnosis not present

## 2012-01-15 DIAGNOSIS — Z5189 Encounter for other specified aftercare: Secondary | ICD-10-CM | POA: Diagnosis not present

## 2012-01-15 DIAGNOSIS — Z9861 Coronary angioplasty status: Secondary | ICD-10-CM | POA: Diagnosis not present

## 2012-01-17 DIAGNOSIS — I252 Old myocardial infarction: Secondary | ICD-10-CM | POA: Diagnosis not present

## 2012-01-17 DIAGNOSIS — Z9861 Coronary angioplasty status: Secondary | ICD-10-CM | POA: Diagnosis not present

## 2012-01-17 DIAGNOSIS — Z5189 Encounter for other specified aftercare: Secondary | ICD-10-CM | POA: Diagnosis not present

## 2012-01-18 DIAGNOSIS — G894 Chronic pain syndrome: Secondary | ICD-10-CM | POA: Diagnosis not present

## 2012-01-22 DIAGNOSIS — I252 Old myocardial infarction: Secondary | ICD-10-CM | POA: Diagnosis not present

## 2012-01-22 DIAGNOSIS — Z9861 Coronary angioplasty status: Secondary | ICD-10-CM | POA: Diagnosis not present

## 2012-01-22 DIAGNOSIS — Z5189 Encounter for other specified aftercare: Secondary | ICD-10-CM | POA: Diagnosis not present

## 2012-01-24 DIAGNOSIS — I252 Old myocardial infarction: Secondary | ICD-10-CM | POA: Diagnosis not present

## 2012-01-24 DIAGNOSIS — Z9861 Coronary angioplasty status: Secondary | ICD-10-CM | POA: Diagnosis not present

## 2012-01-24 DIAGNOSIS — Z5189 Encounter for other specified aftercare: Secondary | ICD-10-CM | POA: Diagnosis not present

## 2012-01-26 DIAGNOSIS — Z5189 Encounter for other specified aftercare: Secondary | ICD-10-CM | POA: Diagnosis not present

## 2012-01-26 DIAGNOSIS — Z9861 Coronary angioplasty status: Secondary | ICD-10-CM | POA: Diagnosis not present

## 2012-01-26 DIAGNOSIS — I252 Old myocardial infarction: Secondary | ICD-10-CM | POA: Diagnosis not present

## 2012-01-29 DIAGNOSIS — Z9861 Coronary angioplasty status: Secondary | ICD-10-CM | POA: Diagnosis not present

## 2012-01-29 DIAGNOSIS — Z5189 Encounter for other specified aftercare: Secondary | ICD-10-CM | POA: Diagnosis not present

## 2012-01-29 DIAGNOSIS — I252 Old myocardial infarction: Secondary | ICD-10-CM | POA: Diagnosis not present

## 2012-01-31 DIAGNOSIS — Z5189 Encounter for other specified aftercare: Secondary | ICD-10-CM | POA: Diagnosis not present

## 2012-01-31 DIAGNOSIS — Z9861 Coronary angioplasty status: Secondary | ICD-10-CM | POA: Diagnosis not present

## 2012-01-31 DIAGNOSIS — I252 Old myocardial infarction: Secondary | ICD-10-CM | POA: Diagnosis not present

## 2012-02-02 DIAGNOSIS — I252 Old myocardial infarction: Secondary | ICD-10-CM | POA: Diagnosis not present

## 2012-02-02 DIAGNOSIS — Z9861 Coronary angioplasty status: Secondary | ICD-10-CM | POA: Diagnosis not present

## 2012-02-02 DIAGNOSIS — Z5189 Encounter for other specified aftercare: Secondary | ICD-10-CM | POA: Diagnosis not present

## 2012-02-05 DIAGNOSIS — Z5189 Encounter for other specified aftercare: Secondary | ICD-10-CM | POA: Diagnosis not present

## 2012-02-05 DIAGNOSIS — I252 Old myocardial infarction: Secondary | ICD-10-CM | POA: Diagnosis not present

## 2012-02-05 DIAGNOSIS — Z9861 Coronary angioplasty status: Secondary | ICD-10-CM | POA: Diagnosis not present

## 2012-02-09 DIAGNOSIS — Z5189 Encounter for other specified aftercare: Secondary | ICD-10-CM | POA: Diagnosis not present

## 2012-02-09 DIAGNOSIS — I252 Old myocardial infarction: Secondary | ICD-10-CM | POA: Diagnosis not present

## 2012-02-09 DIAGNOSIS — Z9861 Coronary angioplasty status: Secondary | ICD-10-CM | POA: Diagnosis not present

## 2012-02-12 DIAGNOSIS — Z9861 Coronary angioplasty status: Secondary | ICD-10-CM | POA: Diagnosis not present

## 2012-02-12 DIAGNOSIS — I252 Old myocardial infarction: Secondary | ICD-10-CM | POA: Diagnosis not present

## 2012-02-12 DIAGNOSIS — Z5189 Encounter for other specified aftercare: Secondary | ICD-10-CM | POA: Diagnosis not present

## 2012-02-14 DIAGNOSIS — I252 Old myocardial infarction: Secondary | ICD-10-CM | POA: Diagnosis not present

## 2012-02-14 DIAGNOSIS — Z9861 Coronary angioplasty status: Secondary | ICD-10-CM | POA: Diagnosis not present

## 2012-02-14 DIAGNOSIS — Z5189 Encounter for other specified aftercare: Secondary | ICD-10-CM | POA: Diagnosis not present

## 2012-02-19 DIAGNOSIS — I2510 Atherosclerotic heart disease of native coronary artery without angina pectoris: Secondary | ICD-10-CM | POA: Diagnosis not present

## 2012-02-19 DIAGNOSIS — E785 Hyperlipidemia, unspecified: Secondary | ICD-10-CM | POA: Diagnosis not present

## 2012-02-23 DIAGNOSIS — Z9861 Coronary angioplasty status: Secondary | ICD-10-CM | POA: Diagnosis not present

## 2012-02-23 DIAGNOSIS — I252 Old myocardial infarction: Secondary | ICD-10-CM | POA: Diagnosis not present

## 2012-02-23 DIAGNOSIS — Z5189 Encounter for other specified aftercare: Secondary | ICD-10-CM | POA: Diagnosis not present

## 2012-02-26 DIAGNOSIS — Z5189 Encounter for other specified aftercare: Secondary | ICD-10-CM | POA: Diagnosis not present

## 2012-02-26 DIAGNOSIS — I252 Old myocardial infarction: Secondary | ICD-10-CM | POA: Diagnosis not present

## 2012-02-26 DIAGNOSIS — Z9861 Coronary angioplasty status: Secondary | ICD-10-CM | POA: Diagnosis not present

## 2012-02-28 DIAGNOSIS — I252 Old myocardial infarction: Secondary | ICD-10-CM | POA: Diagnosis not present

## 2012-02-28 DIAGNOSIS — Z9861 Coronary angioplasty status: Secondary | ICD-10-CM | POA: Diagnosis not present

## 2012-02-28 DIAGNOSIS — Z5189 Encounter for other specified aftercare: Secondary | ICD-10-CM | POA: Diagnosis not present

## 2012-02-29 DIAGNOSIS — G4710 Hypersomnia, unspecified: Secondary | ICD-10-CM | POA: Diagnosis not present

## 2012-02-29 DIAGNOSIS — I2510 Atherosclerotic heart disease of native coronary artery without angina pectoris: Secondary | ICD-10-CM | POA: Diagnosis not present

## 2012-02-29 DIAGNOSIS — E785 Hyperlipidemia, unspecified: Secondary | ICD-10-CM | POA: Diagnosis not present

## 2012-03-01 DIAGNOSIS — Z9861 Coronary angioplasty status: Secondary | ICD-10-CM | POA: Diagnosis not present

## 2012-03-01 DIAGNOSIS — I252 Old myocardial infarction: Secondary | ICD-10-CM | POA: Diagnosis not present

## 2012-03-01 DIAGNOSIS — Z5189 Encounter for other specified aftercare: Secondary | ICD-10-CM | POA: Diagnosis not present

## 2012-03-04 DIAGNOSIS — I252 Old myocardial infarction: Secondary | ICD-10-CM | POA: Diagnosis not present

## 2012-03-04 DIAGNOSIS — Z5189 Encounter for other specified aftercare: Secondary | ICD-10-CM | POA: Diagnosis not present

## 2012-03-04 DIAGNOSIS — Z9861 Coronary angioplasty status: Secondary | ICD-10-CM | POA: Diagnosis not present

## 2012-03-08 DIAGNOSIS — Z9861 Coronary angioplasty status: Secondary | ICD-10-CM | POA: Diagnosis not present

## 2012-03-08 DIAGNOSIS — I252 Old myocardial infarction: Secondary | ICD-10-CM | POA: Diagnosis not present

## 2012-03-08 DIAGNOSIS — Z5189 Encounter for other specified aftercare: Secondary | ICD-10-CM | POA: Diagnosis not present

## 2012-03-11 DIAGNOSIS — L570 Actinic keratosis: Secondary | ICD-10-CM | POA: Diagnosis not present

## 2012-03-11 DIAGNOSIS — Z9861 Coronary angioplasty status: Secondary | ICD-10-CM | POA: Diagnosis not present

## 2012-03-11 DIAGNOSIS — I252 Old myocardial infarction: Secondary | ICD-10-CM | POA: Diagnosis not present

## 2012-03-11 DIAGNOSIS — Z5189 Encounter for other specified aftercare: Secondary | ICD-10-CM | POA: Diagnosis not present

## 2012-03-13 DIAGNOSIS — Z9861 Coronary angioplasty status: Secondary | ICD-10-CM | POA: Diagnosis not present

## 2012-03-13 DIAGNOSIS — Z5189 Encounter for other specified aftercare: Secondary | ICD-10-CM | POA: Diagnosis not present

## 2012-03-13 DIAGNOSIS — I252 Old myocardial infarction: Secondary | ICD-10-CM | POA: Diagnosis not present

## 2012-03-18 DIAGNOSIS — Z5189 Encounter for other specified aftercare: Secondary | ICD-10-CM | POA: Diagnosis not present

## 2012-03-18 DIAGNOSIS — I252 Old myocardial infarction: Secondary | ICD-10-CM | POA: Diagnosis not present

## 2012-03-18 DIAGNOSIS — Z9861 Coronary angioplasty status: Secondary | ICD-10-CM | POA: Diagnosis not present

## 2012-03-20 DIAGNOSIS — Z5189 Encounter for other specified aftercare: Secondary | ICD-10-CM | POA: Diagnosis not present

## 2012-03-20 DIAGNOSIS — I252 Old myocardial infarction: Secondary | ICD-10-CM | POA: Diagnosis not present

## 2012-03-20 DIAGNOSIS — Z9861 Coronary angioplasty status: Secondary | ICD-10-CM | POA: Diagnosis not present

## 2012-04-22 DIAGNOSIS — Z9861 Coronary angioplasty status: Secondary | ICD-10-CM | POA: Diagnosis not present

## 2012-04-22 DIAGNOSIS — Z5189 Encounter for other specified aftercare: Secondary | ICD-10-CM | POA: Diagnosis not present

## 2012-04-22 DIAGNOSIS — I252 Old myocardial infarction: Secondary | ICD-10-CM | POA: Diagnosis not present

## 2012-04-26 DIAGNOSIS — G4733 Obstructive sleep apnea (adult) (pediatric): Secondary | ICD-10-CM | POA: Diagnosis not present

## 2012-05-10 DIAGNOSIS — G894 Chronic pain syndrome: Secondary | ICD-10-CM | POA: Diagnosis not present

## 2012-05-13 DIAGNOSIS — R5383 Other fatigue: Secondary | ICD-10-CM | POA: Diagnosis not present

## 2012-05-13 DIAGNOSIS — G4710 Hypersomnia, unspecified: Secondary | ICD-10-CM | POA: Diagnosis not present

## 2012-05-13 DIAGNOSIS — R0609 Other forms of dyspnea: Secondary | ICD-10-CM | POA: Diagnosis not present

## 2012-05-13 DIAGNOSIS — G4733 Obstructive sleep apnea (adult) (pediatric): Secondary | ICD-10-CM | POA: Diagnosis not present

## 2012-05-13 DIAGNOSIS — R5381 Other malaise: Secondary | ICD-10-CM | POA: Diagnosis not present

## 2012-05-16 DIAGNOSIS — IMO0002 Reserved for concepts with insufficient information to code with codable children: Secondary | ICD-10-CM | POA: Diagnosis not present

## 2012-05-16 DIAGNOSIS — M999 Biomechanical lesion, unspecified: Secondary | ICD-10-CM | POA: Diagnosis not present

## 2012-05-16 DIAGNOSIS — M545 Low back pain: Secondary | ICD-10-CM | POA: Diagnosis not present

## 2012-05-17 DIAGNOSIS — M999 Biomechanical lesion, unspecified: Secondary | ICD-10-CM | POA: Diagnosis not present

## 2012-05-17 DIAGNOSIS — M545 Low back pain: Secondary | ICD-10-CM | POA: Diagnosis not present

## 2012-05-17 DIAGNOSIS — M546 Pain in thoracic spine: Secondary | ICD-10-CM | POA: Diagnosis not present

## 2012-05-17 DIAGNOSIS — M5137 Other intervertebral disc degeneration, lumbosacral region: Secondary | ICD-10-CM | POA: Diagnosis not present

## 2012-05-20 DIAGNOSIS — M5137 Other intervertebral disc degeneration, lumbosacral region: Secondary | ICD-10-CM | POA: Diagnosis not present

## 2012-05-20 DIAGNOSIS — M546 Pain in thoracic spine: Secondary | ICD-10-CM | POA: Diagnosis not present

## 2012-05-20 DIAGNOSIS — M999 Biomechanical lesion, unspecified: Secondary | ICD-10-CM | POA: Diagnosis not present

## 2012-05-20 DIAGNOSIS — M545 Low back pain: Secondary | ICD-10-CM | POA: Diagnosis not present

## 2012-05-21 DIAGNOSIS — M546 Pain in thoracic spine: Secondary | ICD-10-CM | POA: Diagnosis not present

## 2012-05-21 DIAGNOSIS — M999 Biomechanical lesion, unspecified: Secondary | ICD-10-CM | POA: Diagnosis not present

## 2012-05-21 DIAGNOSIS — M5137 Other intervertebral disc degeneration, lumbosacral region: Secondary | ICD-10-CM | POA: Diagnosis not present

## 2012-05-21 DIAGNOSIS — M545 Low back pain: Secondary | ICD-10-CM | POA: Diagnosis not present

## 2012-05-22 DIAGNOSIS — M5137 Other intervertebral disc degeneration, lumbosacral region: Secondary | ICD-10-CM | POA: Diagnosis not present

## 2012-05-22 DIAGNOSIS — M999 Biomechanical lesion, unspecified: Secondary | ICD-10-CM | POA: Diagnosis not present

## 2012-05-22 DIAGNOSIS — M545 Low back pain: Secondary | ICD-10-CM | POA: Diagnosis not present

## 2012-05-22 DIAGNOSIS — M546 Pain in thoracic spine: Secondary | ICD-10-CM | POA: Diagnosis not present

## 2012-05-27 DIAGNOSIS — M545 Low back pain: Secondary | ICD-10-CM | POA: Diagnosis not present

## 2012-05-27 DIAGNOSIS — M546 Pain in thoracic spine: Secondary | ICD-10-CM | POA: Diagnosis not present

## 2012-05-27 DIAGNOSIS — M999 Biomechanical lesion, unspecified: Secondary | ICD-10-CM | POA: Diagnosis not present

## 2012-05-27 DIAGNOSIS — M5137 Other intervertebral disc degeneration, lumbosacral region: Secondary | ICD-10-CM | POA: Diagnosis not present

## 2012-05-29 DIAGNOSIS — M5137 Other intervertebral disc degeneration, lumbosacral region: Secondary | ICD-10-CM | POA: Diagnosis not present

## 2012-05-29 DIAGNOSIS — M546 Pain in thoracic spine: Secondary | ICD-10-CM | POA: Diagnosis not present

## 2012-05-29 DIAGNOSIS — M999 Biomechanical lesion, unspecified: Secondary | ICD-10-CM | POA: Diagnosis not present

## 2012-05-29 DIAGNOSIS — M545 Low back pain: Secondary | ICD-10-CM | POA: Diagnosis not present

## 2012-05-31 DIAGNOSIS — M546 Pain in thoracic spine: Secondary | ICD-10-CM | POA: Diagnosis not present

## 2012-05-31 DIAGNOSIS — M5137 Other intervertebral disc degeneration, lumbosacral region: Secondary | ICD-10-CM | POA: Diagnosis not present

## 2012-05-31 DIAGNOSIS — M545 Low back pain: Secondary | ICD-10-CM | POA: Diagnosis not present

## 2012-05-31 DIAGNOSIS — M999 Biomechanical lesion, unspecified: Secondary | ICD-10-CM | POA: Diagnosis not present

## 2012-06-03 DIAGNOSIS — M5137 Other intervertebral disc degeneration, lumbosacral region: Secondary | ICD-10-CM | POA: Diagnosis not present

## 2012-06-03 DIAGNOSIS — M546 Pain in thoracic spine: Secondary | ICD-10-CM | POA: Diagnosis not present

## 2012-06-03 DIAGNOSIS — M545 Low back pain: Secondary | ICD-10-CM | POA: Diagnosis not present

## 2012-06-03 DIAGNOSIS — E782 Mixed hyperlipidemia: Secondary | ICD-10-CM | POA: Diagnosis not present

## 2012-06-03 DIAGNOSIS — M999 Biomechanical lesion, unspecified: Secondary | ICD-10-CM | POA: Diagnosis not present

## 2012-06-05 DIAGNOSIS — M545 Low back pain: Secondary | ICD-10-CM | POA: Diagnosis not present

## 2012-06-05 DIAGNOSIS — M546 Pain in thoracic spine: Secondary | ICD-10-CM | POA: Diagnosis not present

## 2012-06-05 DIAGNOSIS — M999 Biomechanical lesion, unspecified: Secondary | ICD-10-CM | POA: Diagnosis not present

## 2012-06-05 DIAGNOSIS — M5137 Other intervertebral disc degeneration, lumbosacral region: Secondary | ICD-10-CM | POA: Diagnosis not present

## 2012-06-06 DIAGNOSIS — E785 Hyperlipidemia, unspecified: Secondary | ICD-10-CM | POA: Diagnosis not present

## 2012-06-06 DIAGNOSIS — R5381 Other malaise: Secondary | ICD-10-CM | POA: Diagnosis not present

## 2012-06-06 DIAGNOSIS — I2510 Atherosclerotic heart disease of native coronary artery without angina pectoris: Secondary | ICD-10-CM | POA: Diagnosis not present

## 2012-06-10 DIAGNOSIS — M546 Pain in thoracic spine: Secondary | ICD-10-CM | POA: Diagnosis not present

## 2012-06-10 DIAGNOSIS — M5137 Other intervertebral disc degeneration, lumbosacral region: Secondary | ICD-10-CM | POA: Diagnosis not present

## 2012-06-10 DIAGNOSIS — M999 Biomechanical lesion, unspecified: Secondary | ICD-10-CM | POA: Diagnosis not present

## 2012-06-10 DIAGNOSIS — M545 Low back pain: Secondary | ICD-10-CM | POA: Diagnosis not present

## 2012-06-12 DIAGNOSIS — M546 Pain in thoracic spine: Secondary | ICD-10-CM | POA: Diagnosis not present

## 2012-06-12 DIAGNOSIS — M999 Biomechanical lesion, unspecified: Secondary | ICD-10-CM | POA: Diagnosis not present

## 2012-06-12 DIAGNOSIS — M545 Low back pain: Secondary | ICD-10-CM | POA: Diagnosis not present

## 2012-06-12 DIAGNOSIS — M5137 Other intervertebral disc degeneration, lumbosacral region: Secondary | ICD-10-CM | POA: Diagnosis not present

## 2012-06-14 DIAGNOSIS — M545 Low back pain: Secondary | ICD-10-CM | POA: Diagnosis not present

## 2012-06-14 DIAGNOSIS — M999 Biomechanical lesion, unspecified: Secondary | ICD-10-CM | POA: Diagnosis not present

## 2012-06-14 DIAGNOSIS — M546 Pain in thoracic spine: Secondary | ICD-10-CM | POA: Diagnosis not present

## 2012-06-14 DIAGNOSIS — M5137 Other intervertebral disc degeneration, lumbosacral region: Secondary | ICD-10-CM | POA: Diagnosis not present

## 2012-06-17 DIAGNOSIS — M545 Low back pain: Secondary | ICD-10-CM | POA: Diagnosis not present

## 2012-06-17 DIAGNOSIS — M5137 Other intervertebral disc degeneration, lumbosacral region: Secondary | ICD-10-CM | POA: Diagnosis not present

## 2012-06-17 DIAGNOSIS — M546 Pain in thoracic spine: Secondary | ICD-10-CM | POA: Diagnosis not present

## 2012-06-17 DIAGNOSIS — M999 Biomechanical lesion, unspecified: Secondary | ICD-10-CM | POA: Diagnosis not present

## 2012-06-19 DIAGNOSIS — M545 Low back pain: Secondary | ICD-10-CM | POA: Diagnosis not present

## 2012-06-19 DIAGNOSIS — M5137 Other intervertebral disc degeneration, lumbosacral region: Secondary | ICD-10-CM | POA: Diagnosis not present

## 2012-06-19 DIAGNOSIS — M546 Pain in thoracic spine: Secondary | ICD-10-CM | POA: Diagnosis not present

## 2012-06-19 DIAGNOSIS — M999 Biomechanical lesion, unspecified: Secondary | ICD-10-CM | POA: Diagnosis not present

## 2012-06-21 DIAGNOSIS — M546 Pain in thoracic spine: Secondary | ICD-10-CM | POA: Diagnosis not present

## 2012-06-21 DIAGNOSIS — M999 Biomechanical lesion, unspecified: Secondary | ICD-10-CM | POA: Diagnosis not present

## 2012-06-21 DIAGNOSIS — M545 Low back pain: Secondary | ICD-10-CM | POA: Diagnosis not present

## 2012-06-21 DIAGNOSIS — M5137 Other intervertebral disc degeneration, lumbosacral region: Secondary | ICD-10-CM | POA: Diagnosis not present

## 2012-06-24 DIAGNOSIS — M5137 Other intervertebral disc degeneration, lumbosacral region: Secondary | ICD-10-CM | POA: Diagnosis not present

## 2012-06-24 DIAGNOSIS — M546 Pain in thoracic spine: Secondary | ICD-10-CM | POA: Diagnosis not present

## 2012-06-24 DIAGNOSIS — M545 Low back pain: Secondary | ICD-10-CM | POA: Diagnosis not present

## 2012-06-24 DIAGNOSIS — M999 Biomechanical lesion, unspecified: Secondary | ICD-10-CM | POA: Diagnosis not present

## 2012-06-28 DIAGNOSIS — M546 Pain in thoracic spine: Secondary | ICD-10-CM | POA: Diagnosis not present

## 2012-06-28 DIAGNOSIS — M5137 Other intervertebral disc degeneration, lumbosacral region: Secondary | ICD-10-CM | POA: Diagnosis not present

## 2012-06-28 DIAGNOSIS — M545 Low back pain: Secondary | ICD-10-CM | POA: Diagnosis not present

## 2012-06-28 DIAGNOSIS — M999 Biomechanical lesion, unspecified: Secondary | ICD-10-CM | POA: Diagnosis not present

## 2012-07-02 DIAGNOSIS — M546 Pain in thoracic spine: Secondary | ICD-10-CM | POA: Diagnosis not present

## 2012-07-02 DIAGNOSIS — M5137 Other intervertebral disc degeneration, lumbosacral region: Secondary | ICD-10-CM | POA: Diagnosis not present

## 2012-07-02 DIAGNOSIS — M999 Biomechanical lesion, unspecified: Secondary | ICD-10-CM | POA: Diagnosis not present

## 2012-07-02 DIAGNOSIS — M545 Low back pain: Secondary | ICD-10-CM | POA: Diagnosis not present

## 2012-07-03 DIAGNOSIS — R5381 Other malaise: Secondary | ICD-10-CM | POA: Diagnosis not present

## 2012-07-03 DIAGNOSIS — I2510 Atherosclerotic heart disease of native coronary artery without angina pectoris: Secondary | ICD-10-CM | POA: Diagnosis not present

## 2012-07-03 DIAGNOSIS — E782 Mixed hyperlipidemia: Secondary | ICD-10-CM | POA: Diagnosis not present

## 2012-07-03 DIAGNOSIS — G4730 Sleep apnea, unspecified: Secondary | ICD-10-CM | POA: Diagnosis not present

## 2012-07-03 DIAGNOSIS — G4762 Sleep related leg cramps: Secondary | ICD-10-CM | POA: Diagnosis not present

## 2012-07-03 DIAGNOSIS — G4701 Insomnia due to medical condition: Secondary | ICD-10-CM | POA: Diagnosis not present

## 2012-07-03 DIAGNOSIS — R972 Elevated prostate specific antigen [PSA]: Secondary | ICD-10-CM | POA: Diagnosis not present

## 2012-07-03 DIAGNOSIS — K219 Gastro-esophageal reflux disease without esophagitis: Secondary | ICD-10-CM | POA: Diagnosis not present

## 2012-07-03 DIAGNOSIS — R5383 Other fatigue: Secondary | ICD-10-CM | POA: Diagnosis not present

## 2012-08-15 DIAGNOSIS — G4733 Obstructive sleep apnea (adult) (pediatric): Secondary | ICD-10-CM | POA: Diagnosis not present

## 2012-08-19 DIAGNOSIS — M545 Low back pain: Secondary | ICD-10-CM | POA: Diagnosis not present

## 2012-08-19 DIAGNOSIS — M5137 Other intervertebral disc degeneration, lumbosacral region: Secondary | ICD-10-CM | POA: Diagnosis not present

## 2012-08-19 DIAGNOSIS — M999 Biomechanical lesion, unspecified: Secondary | ICD-10-CM | POA: Diagnosis not present

## 2012-08-19 DIAGNOSIS — M546 Pain in thoracic spine: Secondary | ICD-10-CM | POA: Diagnosis not present

## 2012-09-02 DIAGNOSIS — Z23 Encounter for immunization: Secondary | ICD-10-CM | POA: Diagnosis not present

## 2012-09-02 DIAGNOSIS — R5381 Other malaise: Secondary | ICD-10-CM | POA: Diagnosis not present

## 2012-09-02 DIAGNOSIS — G4733 Obstructive sleep apnea (adult) (pediatric): Secondary | ICD-10-CM | POA: Diagnosis not present

## 2012-09-02 DIAGNOSIS — E782 Mixed hyperlipidemia: Secondary | ICD-10-CM | POA: Diagnosis not present

## 2012-09-02 DIAGNOSIS — Z0000 Encounter for general adult medical examination without abnormal findings: Secondary | ICD-10-CM | POA: Diagnosis not present

## 2012-09-02 DIAGNOSIS — R5383 Other fatigue: Secondary | ICD-10-CM | POA: Diagnosis not present

## 2012-09-02 DIAGNOSIS — G4701 Insomnia due to medical condition: Secondary | ICD-10-CM | POA: Diagnosis not present

## 2012-09-02 DIAGNOSIS — G4730 Sleep apnea, unspecified: Secondary | ICD-10-CM | POA: Diagnosis not present

## 2012-09-02 DIAGNOSIS — K219 Gastro-esophageal reflux disease without esophagitis: Secondary | ICD-10-CM | POA: Diagnosis not present

## 2012-09-02 DIAGNOSIS — I2510 Atherosclerotic heart disease of native coronary artery without angina pectoris: Secondary | ICD-10-CM | POA: Diagnosis not present

## 2012-09-02 DIAGNOSIS — G4762 Sleep related leg cramps: Secondary | ICD-10-CM | POA: Diagnosis not present

## 2012-09-10 DIAGNOSIS — L570 Actinic keratosis: Secondary | ICD-10-CM | POA: Diagnosis not present

## 2012-09-17 DIAGNOSIS — M546 Pain in thoracic spine: Secondary | ICD-10-CM | POA: Diagnosis not present

## 2012-09-17 DIAGNOSIS — M545 Low back pain: Secondary | ICD-10-CM | POA: Diagnosis not present

## 2012-09-17 DIAGNOSIS — M999 Biomechanical lesion, unspecified: Secondary | ICD-10-CM | POA: Diagnosis not present

## 2012-09-17 DIAGNOSIS — M5137 Other intervertebral disc degeneration, lumbosacral region: Secondary | ICD-10-CM | POA: Diagnosis not present

## 2012-09-25 DIAGNOSIS — M5137 Other intervertebral disc degeneration, lumbosacral region: Secondary | ICD-10-CM | POA: Diagnosis not present

## 2012-09-25 DIAGNOSIS — M999 Biomechanical lesion, unspecified: Secondary | ICD-10-CM | POA: Diagnosis not present

## 2012-09-25 DIAGNOSIS — M546 Pain in thoracic spine: Secondary | ICD-10-CM | POA: Diagnosis not present

## 2012-09-25 DIAGNOSIS — H2510 Age-related nuclear cataract, unspecified eye: Secondary | ICD-10-CM | POA: Diagnosis not present

## 2012-09-25 DIAGNOSIS — M545 Low back pain: Secondary | ICD-10-CM | POA: Diagnosis not present

## 2012-09-25 DIAGNOSIS — H52209 Unspecified astigmatism, unspecified eye: Secondary | ICD-10-CM | POA: Diagnosis not present

## 2012-10-07 DIAGNOSIS — J019 Acute sinusitis, unspecified: Secondary | ICD-10-CM | POA: Diagnosis not present

## 2012-10-14 DIAGNOSIS — M546 Pain in thoracic spine: Secondary | ICD-10-CM | POA: Diagnosis not present

## 2012-10-14 DIAGNOSIS — J019 Acute sinusitis, unspecified: Secondary | ICD-10-CM | POA: Diagnosis not present

## 2012-10-14 DIAGNOSIS — M999 Biomechanical lesion, unspecified: Secondary | ICD-10-CM | POA: Diagnosis not present

## 2012-10-14 DIAGNOSIS — H6120 Impacted cerumen, unspecified ear: Secondary | ICD-10-CM | POA: Diagnosis not present

## 2012-10-14 DIAGNOSIS — M5137 Other intervertebral disc degeneration, lumbosacral region: Secondary | ICD-10-CM | POA: Diagnosis not present

## 2012-10-14 DIAGNOSIS — M545 Low back pain: Secondary | ICD-10-CM | POA: Diagnosis not present

## 2012-11-04 DIAGNOSIS — M546 Pain in thoracic spine: Secondary | ICD-10-CM | POA: Diagnosis not present

## 2012-11-04 DIAGNOSIS — M999 Biomechanical lesion, unspecified: Secondary | ICD-10-CM | POA: Diagnosis not present

## 2012-11-04 DIAGNOSIS — M545 Low back pain: Secondary | ICD-10-CM | POA: Diagnosis not present

## 2012-11-04 DIAGNOSIS — M5137 Other intervertebral disc degeneration, lumbosacral region: Secondary | ICD-10-CM | POA: Diagnosis not present

## 2012-11-22 DIAGNOSIS — M999 Biomechanical lesion, unspecified: Secondary | ICD-10-CM | POA: Diagnosis not present

## 2012-11-22 DIAGNOSIS — M545 Low back pain: Secondary | ICD-10-CM | POA: Diagnosis not present

## 2012-11-22 DIAGNOSIS — M5137 Other intervertebral disc degeneration, lumbosacral region: Secondary | ICD-10-CM | POA: Diagnosis not present

## 2012-11-22 DIAGNOSIS — M546 Pain in thoracic spine: Secondary | ICD-10-CM | POA: Diagnosis not present

## 2012-11-25 DIAGNOSIS — R079 Chest pain, unspecified: Secondary | ICD-10-CM | POA: Diagnosis not present

## 2012-11-25 DIAGNOSIS — M545 Low back pain: Secondary | ICD-10-CM | POA: Diagnosis not present

## 2012-11-25 DIAGNOSIS — M999 Biomechanical lesion, unspecified: Secondary | ICD-10-CM | POA: Diagnosis not present

## 2012-11-25 DIAGNOSIS — E782 Mixed hyperlipidemia: Secondary | ICD-10-CM | POA: Diagnosis not present

## 2012-11-25 DIAGNOSIS — Z79899 Other long term (current) drug therapy: Secondary | ICD-10-CM | POA: Diagnosis not present

## 2012-11-25 DIAGNOSIS — M546 Pain in thoracic spine: Secondary | ICD-10-CM | POA: Diagnosis not present

## 2012-11-25 DIAGNOSIS — M549 Dorsalgia, unspecified: Secondary | ICD-10-CM | POA: Diagnosis not present

## 2012-11-25 DIAGNOSIS — M5137 Other intervertebral disc degeneration, lumbosacral region: Secondary | ICD-10-CM | POA: Diagnosis not present

## 2012-11-25 DIAGNOSIS — G4730 Sleep apnea, unspecified: Secondary | ICD-10-CM | POA: Diagnosis not present

## 2012-12-05 ENCOUNTER — Other Ambulatory Visit: Payer: Self-pay | Admitting: Internal Medicine

## 2012-12-05 DIAGNOSIS — E782 Mixed hyperlipidemia: Secondary | ICD-10-CM | POA: Diagnosis not present

## 2012-12-05 DIAGNOSIS — E559 Vitamin D deficiency, unspecified: Secondary | ICD-10-CM | POA: Diagnosis not present

## 2012-12-05 DIAGNOSIS — Z1331 Encounter for screening for depression: Secondary | ICD-10-CM | POA: Diagnosis not present

## 2012-12-05 DIAGNOSIS — IMO0002 Reserved for concepts with insufficient information to code with codable children: Secondary | ICD-10-CM

## 2012-12-05 DIAGNOSIS — R079 Chest pain, unspecified: Secondary | ICD-10-CM | POA: Diagnosis not present

## 2012-12-05 DIAGNOSIS — R972 Elevated prostate specific antigen [PSA]: Secondary | ICD-10-CM | POA: Diagnosis not present

## 2012-12-05 DIAGNOSIS — M549 Dorsalgia, unspecified: Secondary | ICD-10-CM | POA: Diagnosis not present

## 2012-12-05 DIAGNOSIS — Z0000 Encounter for general adult medical examination without abnormal findings: Secondary | ICD-10-CM | POA: Diagnosis not present

## 2012-12-05 DIAGNOSIS — Z79899 Other long term (current) drug therapy: Secondary | ICD-10-CM | POA: Diagnosis not present

## 2012-12-06 ENCOUNTER — Ambulatory Visit
Admission: RE | Admit: 2012-12-06 | Discharge: 2012-12-06 | Disposition: A | Discharge status: Home / Self Care | Payer: Medicare Other | Source: Ambulatory Visit | Attending: Internal Medicine | Admitting: Internal Medicine

## 2012-12-06 DIAGNOSIS — IMO0002 Reserved for concepts with insufficient information to code with codable children: Secondary | ICD-10-CM | POA: Diagnosis not present

## 2012-12-06 DIAGNOSIS — R079 Chest pain, unspecified: Secondary | ICD-10-CM

## 2012-12-11 DIAGNOSIS — J019 Acute sinusitis, unspecified: Secondary | ICD-10-CM | POA: Diagnosis not present

## 2012-12-28 DIAGNOSIS — G894 Chronic pain syndrome: Secondary | ICD-10-CM | POA: Diagnosis not present

## 2012-12-28 DIAGNOSIS — M5030 Other cervical disc degeneration, unspecified cervical region: Secondary | ICD-10-CM | POA: Diagnosis not present

## 2012-12-31 DIAGNOSIS — M5030 Other cervical disc degeneration, unspecified cervical region: Secondary | ICD-10-CM | POA: Diagnosis not present

## 2013-01-01 ENCOUNTER — Other Ambulatory Visit: Payer: Self-pay | Admitting: Orthopedic Surgery

## 2013-01-01 DIAGNOSIS — R0781 Pleurodynia: Secondary | ICD-10-CM

## 2013-01-03 ENCOUNTER — Other Ambulatory Visit: Payer: Medicare Other

## 2013-01-08 ENCOUNTER — Ambulatory Visit
Admission: RE | Admit: 2013-01-08 | Discharge: 2013-01-08 | Disposition: A | Discharge status: Home / Self Care | Payer: Medicare Other | Source: Ambulatory Visit | Attending: Orthopedic Surgery | Admitting: Orthopedic Surgery

## 2013-01-08 DIAGNOSIS — R079 Chest pain, unspecified: Secondary | ICD-10-CM | POA: Diagnosis not present

## 2013-01-23 DIAGNOSIS — M999 Biomechanical lesion, unspecified: Secondary | ICD-10-CM | POA: Diagnosis not present

## 2013-01-23 DIAGNOSIS — M546 Pain in thoracic spine: Secondary | ICD-10-CM | POA: Diagnosis not present

## 2013-01-23 DIAGNOSIS — M5137 Other intervertebral disc degeneration, lumbosacral region: Secondary | ICD-10-CM | POA: Diagnosis not present

## 2013-01-23 DIAGNOSIS — M545 Low back pain: Secondary | ICD-10-CM | POA: Diagnosis not present

## 2013-02-10 DIAGNOSIS — M999 Biomechanical lesion, unspecified: Secondary | ICD-10-CM | POA: Diagnosis not present

## 2013-02-10 DIAGNOSIS — M5137 Other intervertebral disc degeneration, lumbosacral region: Secondary | ICD-10-CM | POA: Diagnosis not present

## 2013-02-10 DIAGNOSIS — M545 Low back pain: Secondary | ICD-10-CM | POA: Diagnosis not present

## 2013-02-10 DIAGNOSIS — M546 Pain in thoracic spine: Secondary | ICD-10-CM | POA: Diagnosis not present

## 2013-02-14 DIAGNOSIS — J0100 Acute maxillary sinusitis, unspecified: Secondary | ICD-10-CM | POA: Diagnosis not present

## 2013-03-11 DIAGNOSIS — C4441 Basal cell carcinoma of skin of scalp and neck: Secondary | ICD-10-CM | POA: Diagnosis not present

## 2013-03-11 DIAGNOSIS — L570 Actinic keratosis: Secondary | ICD-10-CM | POA: Diagnosis not present

## 2013-04-02 ENCOUNTER — Other Ambulatory Visit: Payer: Self-pay | Admitting: Gastroenterology

## 2013-04-02 DIAGNOSIS — D126 Benign neoplasm of colon, unspecified: Secondary | ICD-10-CM | POA: Diagnosis not present

## 2013-04-02 DIAGNOSIS — K5730 Diverticulosis of large intestine without perforation or abscess without bleeding: Secondary | ICD-10-CM | POA: Diagnosis not present

## 2013-04-02 DIAGNOSIS — Z1211 Encounter for screening for malignant neoplasm of colon: Secondary | ICD-10-CM | POA: Diagnosis not present

## 2013-04-02 DIAGNOSIS — R1013 Epigastric pain: Secondary | ICD-10-CM | POA: Diagnosis not present

## 2013-05-15 IMAGING — CT CT CHEST W/O CM
3 of 4 series · 17 of 30 positions shown, 19 images · non-contrast
Comparison: CT scan of the chest dated 09/12/2011

CLINICAL DATA: Right lower posterior rib pain.

CT CHEST WITHOUT CONTRAST
TECHNIQUE: Multidetector CT imaging of the chest was performed
following the standard protocol without IV contrast.

[Series 3: chest w/o · axial · non-contrast · 0.70mm/px · z∈[-296,-46]mm · 5 of 76 slices shown, 7 images]
[im 13/76  mediastinal]
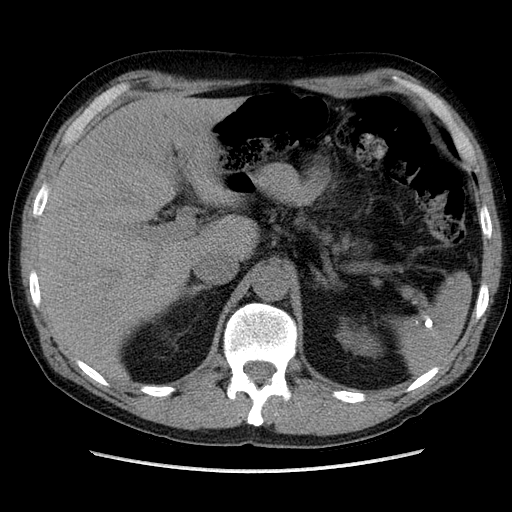
[im 13/76  lung]
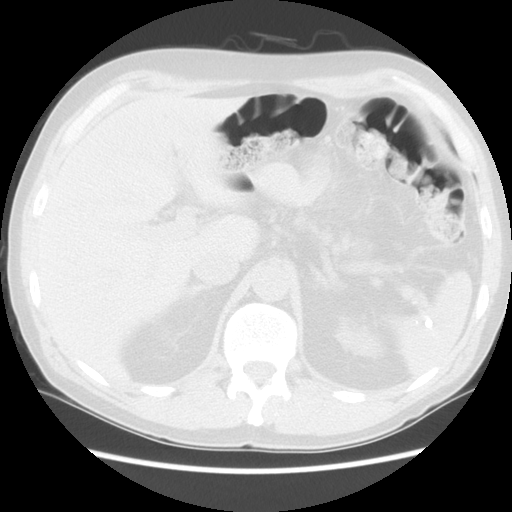
[im 26/76  lung]
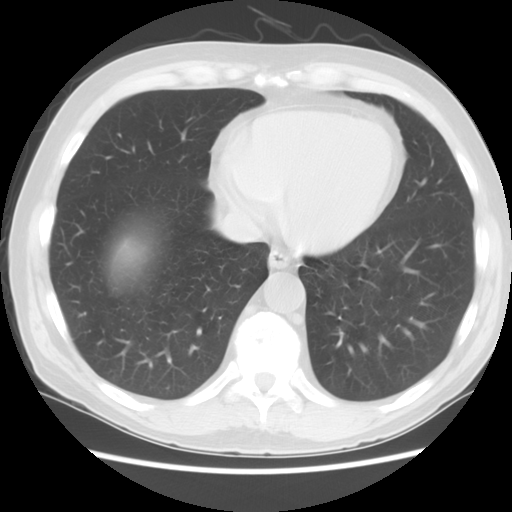
[im 38/76  lung]
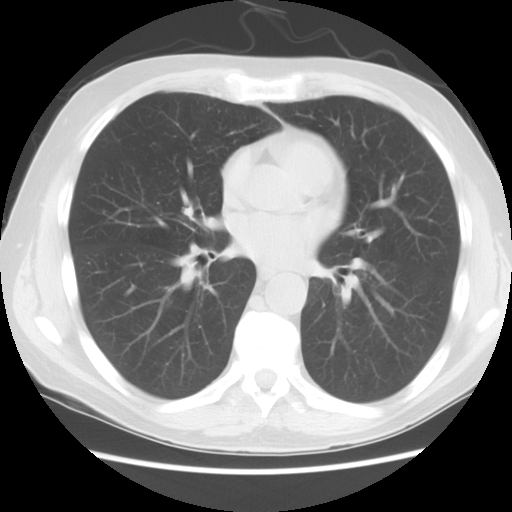
[im 51/76  lung]
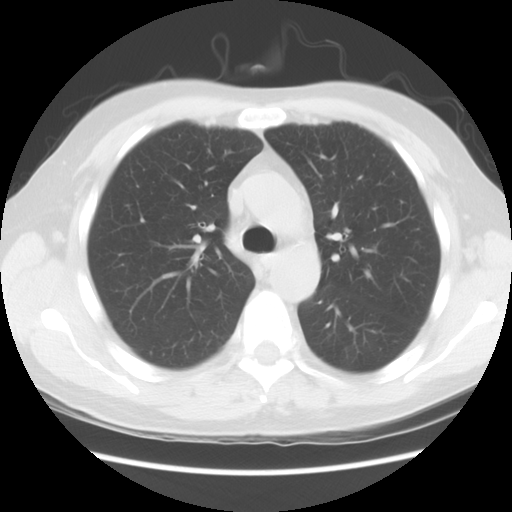
[im 63/76  mediastinal]
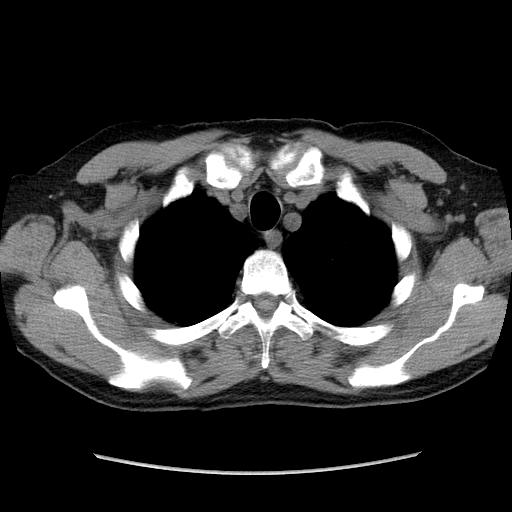
[im 63/76  lung]
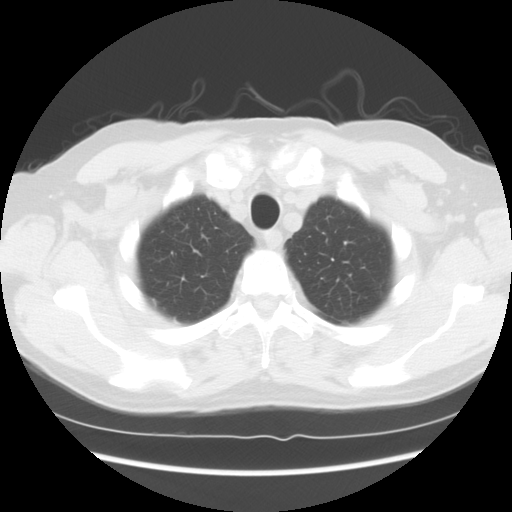

[Series 4: lung windows · axial · 0.70mm/px · z∈[-281,-41]mm · 5 of 74 slices shown]
[im 13/74  lung]
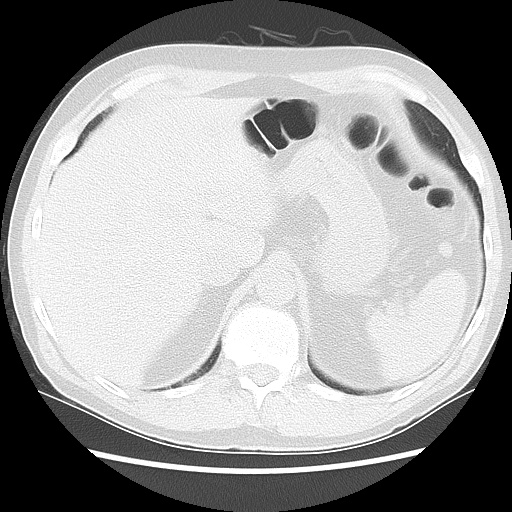
[im 25/74  lung]
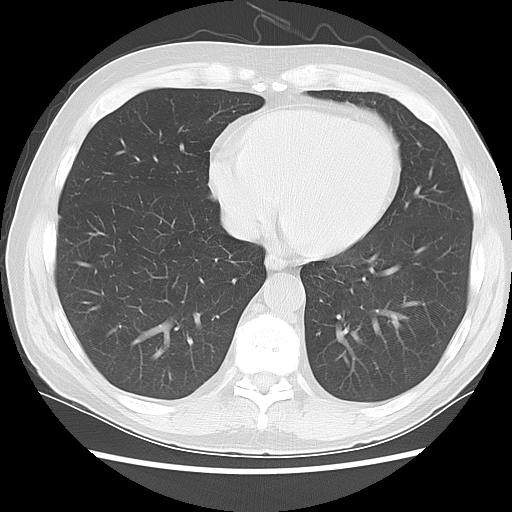
[im 37/74  lung]
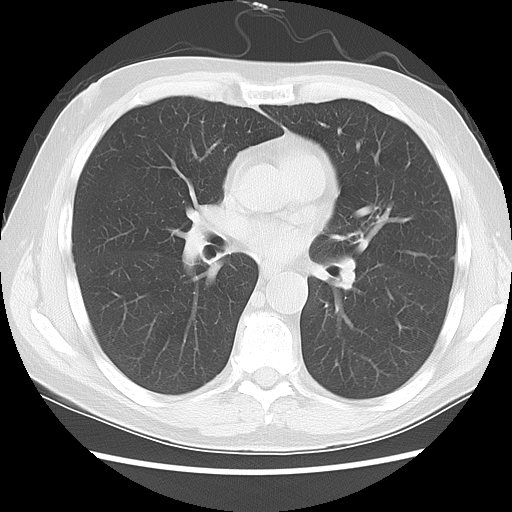
[im 49/74  lung]
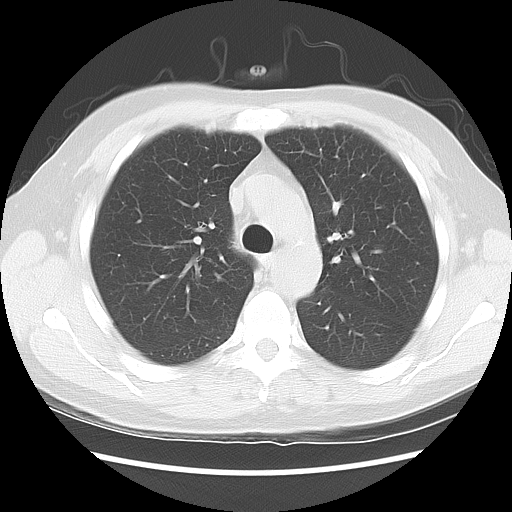
[im 61/74  lung]
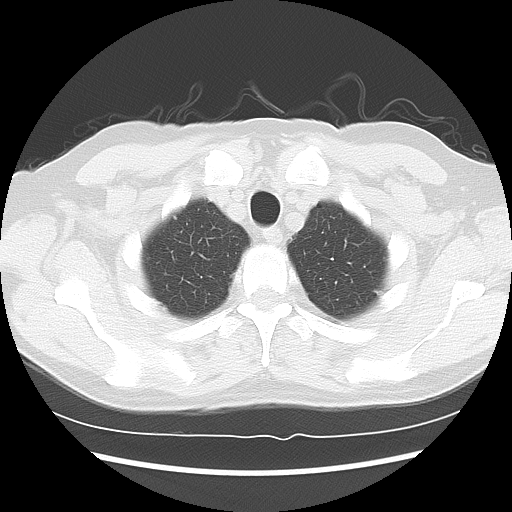

[Series 602: sagittal body · sagittal · 0.74mm/px · 7 of 145 slices shown]
[im 12/145  mediastinal]
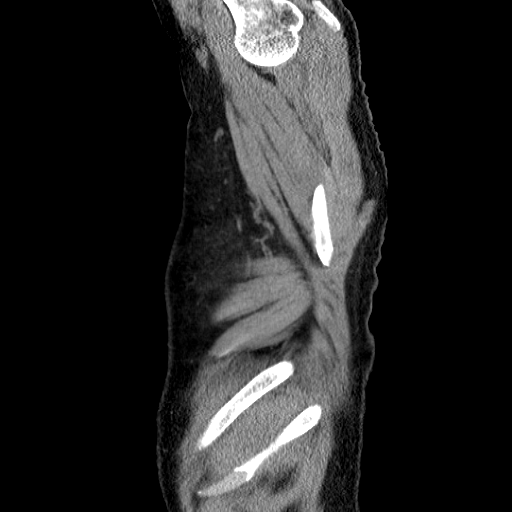
[im 34/145  mediastinal]
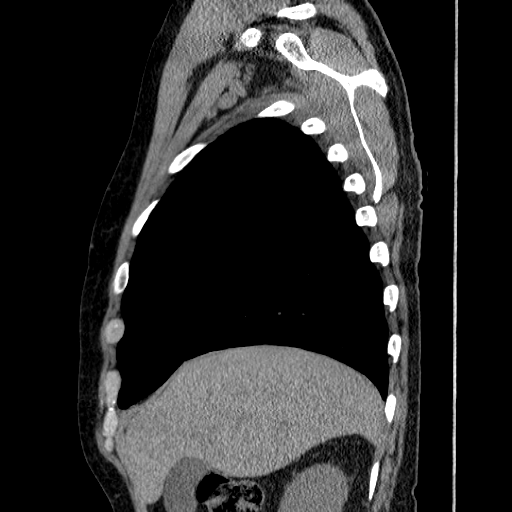
[im 45/145  mediastinal]
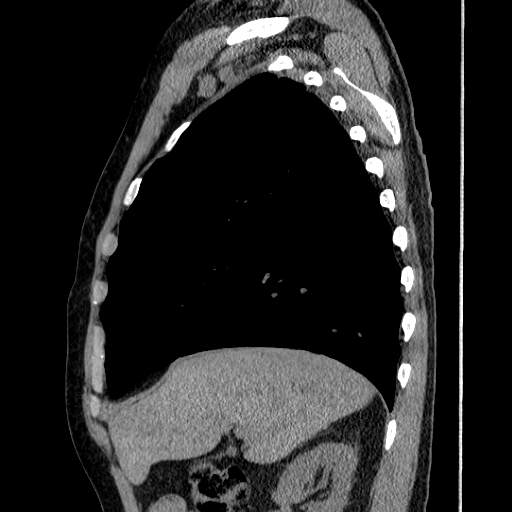
[im 67/145  mediastinal]
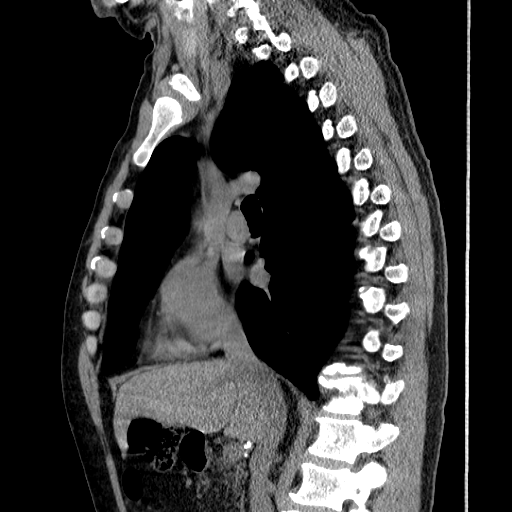
[im 78/145  mediastinal]
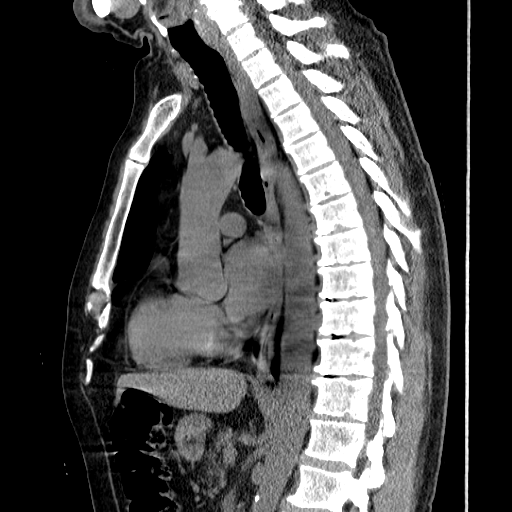
[im 100/145  mediastinal]
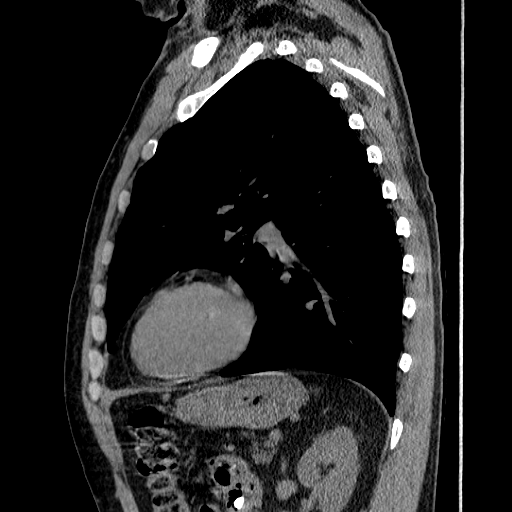
[im 111/145  mediastinal]
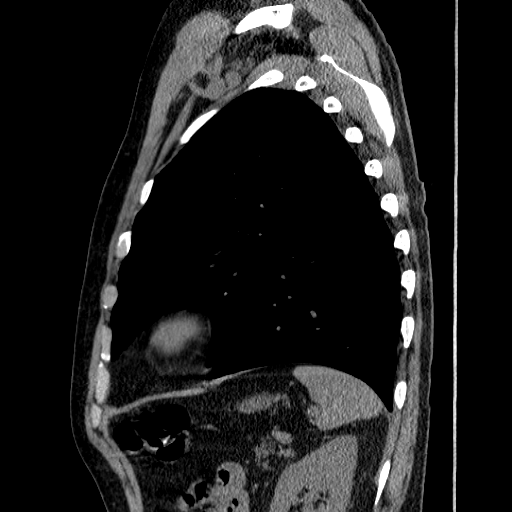

[17 of 30 positions shown; findings below may reference images not displayed]

FINDINGS: The bony thorax appears normal other than multilevel
slight degenerative disc disease in the thoracic spine.  There are
no visible disc protrusions into the spinal canal.  There is no
spinal or foraminal stenosis or facet arthritis.

The lungs are clear.  Heart size and vascularity are normal.
Calcification or stents in the coronary arteries.

The visualized portion of the upper abdomen is normal except for a
calcified granuloma in the posterior aspect of the right lobe of
the liver.
IMPRESSION: Slight degenerative disc disease in the thoracic spine.  Otherwise,
normal exam.  Specifically, the right ribs appear normal.

## 2013-05-20 DIAGNOSIS — E782 Mixed hyperlipidemia: Secondary | ICD-10-CM | POA: Diagnosis not present

## 2013-05-20 DIAGNOSIS — I209 Angina pectoris, unspecified: Secondary | ICD-10-CM | POA: Diagnosis not present

## 2013-05-20 DIAGNOSIS — I2510 Atherosclerotic heart disease of native coronary artery without angina pectoris: Secondary | ICD-10-CM | POA: Diagnosis not present

## 2013-05-26 DIAGNOSIS — M5137 Other intervertebral disc degeneration, lumbosacral region: Secondary | ICD-10-CM | POA: Diagnosis not present

## 2013-05-26 DIAGNOSIS — M999 Biomechanical lesion, unspecified: Secondary | ICD-10-CM | POA: Diagnosis not present

## 2013-05-26 DIAGNOSIS — M545 Low back pain: Secondary | ICD-10-CM | POA: Diagnosis not present

## 2013-05-26 DIAGNOSIS — M546 Pain in thoracic spine: Secondary | ICD-10-CM | POA: Diagnosis not present

## 2013-06-12 DIAGNOSIS — M546 Pain in thoracic spine: Secondary | ICD-10-CM | POA: Diagnosis not present

## 2013-06-12 DIAGNOSIS — M5137 Other intervertebral disc degeneration, lumbosacral region: Secondary | ICD-10-CM | POA: Diagnosis not present

## 2013-06-12 DIAGNOSIS — M999 Biomechanical lesion, unspecified: Secondary | ICD-10-CM | POA: Diagnosis not present

## 2013-06-12 DIAGNOSIS — M545 Low back pain: Secondary | ICD-10-CM | POA: Diagnosis not present

## 2013-06-14 DIAGNOSIS — R079 Chest pain, unspecified: Secondary | ICD-10-CM | POA: Diagnosis not present

## 2013-06-14 DIAGNOSIS — Z79899 Other long term (current) drug therapy: Secondary | ICD-10-CM | POA: Diagnosis not present

## 2013-06-14 DIAGNOSIS — R0682 Tachypnea, not elsewhere classified: Secondary | ICD-10-CM | POA: Diagnosis not present

## 2013-06-14 DIAGNOSIS — X58XXXA Exposure to other specified factors, initial encounter: Secondary | ICD-10-CM | POA: Diagnosis not present

## 2013-06-14 DIAGNOSIS — Z7982 Long term (current) use of aspirin: Secondary | ICD-10-CM | POA: Diagnosis not present

## 2013-06-14 DIAGNOSIS — R0602 Shortness of breath: Secondary | ICD-10-CM | POA: Diagnosis not present

## 2013-06-14 DIAGNOSIS — T782XXA Anaphylactic shock, unspecified, initial encounter: Secondary | ICD-10-CM | POA: Diagnosis not present

## 2013-06-14 DIAGNOSIS — E785 Hyperlipidemia, unspecified: Secondary | ICD-10-CM | POA: Diagnosis not present

## 2013-06-14 DIAGNOSIS — K219 Gastro-esophageal reflux disease without esophagitis: Secondary | ICD-10-CM | POA: Diagnosis not present

## 2013-06-18 DIAGNOSIS — T7840XA Allergy, unspecified, initial encounter: Secondary | ICD-10-CM | POA: Diagnosis not present

## 2013-06-24 DIAGNOSIS — M5030 Other cervical disc degeneration, unspecified cervical region: Secondary | ICD-10-CM | POA: Diagnosis not present

## 2013-06-24 DIAGNOSIS — M5137 Other intervertebral disc degeneration, lumbosacral region: Secondary | ICD-10-CM | POA: Diagnosis not present

## 2013-07-04 DIAGNOSIS — M5030 Other cervical disc degeneration, unspecified cervical region: Secondary | ICD-10-CM | POA: Diagnosis not present

## 2013-07-04 DIAGNOSIS — M47812 Spondylosis without myelopathy or radiculopathy, cervical region: Secondary | ICD-10-CM | POA: Diagnosis not present

## 2013-07-04 DIAGNOSIS — M5137 Other intervertebral disc degeneration, lumbosacral region: Secondary | ICD-10-CM | POA: Diagnosis not present

## 2013-07-18 DIAGNOSIS — M5030 Other cervical disc degeneration, unspecified cervical region: Secondary | ICD-10-CM | POA: Diagnosis not present

## 2013-07-31 DIAGNOSIS — M5030 Other cervical disc degeneration, unspecified cervical region: Secondary | ICD-10-CM | POA: Diagnosis not present

## 2013-07-31 DIAGNOSIS — R109 Unspecified abdominal pain: Secondary | ICD-10-CM | POA: Diagnosis not present

## 2013-08-21 DIAGNOSIS — M5030 Other cervical disc degeneration, unspecified cervical region: Secondary | ICD-10-CM | POA: Diagnosis not present

## 2013-08-21 DIAGNOSIS — M5137 Other intervertebral disc degeneration, lumbosacral region: Secondary | ICD-10-CM | POA: Diagnosis not present

## 2013-09-16 DIAGNOSIS — Z23 Encounter for immunization: Secondary | ICD-10-CM | POA: Diagnosis not present

## 2013-09-24 DIAGNOSIS — H2510 Age-related nuclear cataract, unspecified eye: Secondary | ICD-10-CM | POA: Diagnosis not present

## 2013-09-24 DIAGNOSIS — H52209 Unspecified astigmatism, unspecified eye: Secondary | ICD-10-CM | POA: Diagnosis not present

## 2013-10-07 DIAGNOSIS — J0100 Acute maxillary sinusitis, unspecified: Secondary | ICD-10-CM | POA: Diagnosis not present

## 2013-10-08 DIAGNOSIS — M19079 Primary osteoarthritis, unspecified ankle and foot: Secondary | ICD-10-CM | POA: Diagnosis not present

## 2013-10-13 ENCOUNTER — Other Ambulatory Visit: Payer: Self-pay | Admitting: *Deleted

## 2013-10-13 DIAGNOSIS — Z79899 Other long term (current) drug therapy: Secondary | ICD-10-CM

## 2013-10-13 DIAGNOSIS — E782 Mixed hyperlipidemia: Secondary | ICD-10-CM

## 2013-10-17 DIAGNOSIS — J019 Acute sinusitis, unspecified: Secondary | ICD-10-CM | POA: Diagnosis not present

## 2013-10-24 DIAGNOSIS — M19079 Primary osteoarthritis, unspecified ankle and foot: Secondary | ICD-10-CM | POA: Diagnosis not present

## 2013-10-30 DIAGNOSIS — J0100 Acute maxillary sinusitis, unspecified: Secondary | ICD-10-CM | POA: Diagnosis not present

## 2013-11-07 DIAGNOSIS — M5030 Other cervical disc degeneration, unspecified cervical region: Secondary | ICD-10-CM | POA: Diagnosis not present

## 2013-11-07 DIAGNOSIS — M47812 Spondylosis without myelopathy or radiculopathy, cervical region: Secondary | ICD-10-CM | POA: Diagnosis not present

## 2013-11-10 DIAGNOSIS — J019 Acute sinusitis, unspecified: Secondary | ICD-10-CM | POA: Diagnosis not present

## 2013-11-10 DIAGNOSIS — J40 Bronchitis, not specified as acute or chronic: Secondary | ICD-10-CM | POA: Diagnosis not present

## 2013-11-18 ENCOUNTER — Encounter: Payer: Self-pay | Admitting: Cardiology

## 2013-11-18 DIAGNOSIS — G4733 Obstructive sleep apnea (adult) (pediatric): Secondary | ICD-10-CM | POA: Insufficient documentation

## 2013-11-18 DIAGNOSIS — E785 Hyperlipidemia, unspecified: Secondary | ICD-10-CM | POA: Insufficient documentation

## 2013-11-18 DIAGNOSIS — R0602 Shortness of breath: Secondary | ICD-10-CM | POA: Insufficient documentation

## 2013-11-18 DIAGNOSIS — I219 Acute myocardial infarction, unspecified: Secondary | ICD-10-CM | POA: Insufficient documentation

## 2013-11-24 ENCOUNTER — Other Ambulatory Visit: Payer: Medicare Other

## 2013-11-25 ENCOUNTER — Other Ambulatory Visit: Payer: Medicare Other

## 2013-11-26 ENCOUNTER — Other Ambulatory Visit (INDEPENDENT_AMBULATORY_CARE_PROVIDER_SITE_OTHER): Payer: Medicare Other

## 2013-11-26 DIAGNOSIS — E782 Mixed hyperlipidemia: Secondary | ICD-10-CM

## 2013-11-26 DIAGNOSIS — Z79899 Other long term (current) drug therapy: Secondary | ICD-10-CM | POA: Diagnosis not present

## 2013-11-26 DIAGNOSIS — J3489 Other specified disorders of nose and nasal sinuses: Secondary | ICD-10-CM | POA: Diagnosis not present

## 2013-11-26 LAB — HEPATIC FUNCTION PANEL
ALK PHOS: 49 U/L (ref 39–117)
ALT: 27 U/L (ref 0–53)
AST: 23 U/L (ref 0–37)
Albumin: 3.8 g/dL (ref 3.5–5.2)
BILIRUBIN DIRECT: 0.1 mg/dL (ref 0.0–0.3)
BILIRUBIN TOTAL: 0.6 mg/dL (ref 0.3–1.2)
Total Protein: 6.4 g/dL (ref 6.0–8.3)

## 2013-11-26 LAB — LIPID PANEL
Cholesterol: 150 mg/dL (ref 0–200)
HDL: 55.3 mg/dL (ref >39.00)
LDL Cholesterol: 86 mg/dL (ref 0–99)
Total CHOL/HDL Ratio: 3
Triglycerides: 43 mg/dL (ref 0.0–149.0)
VLDL: 8.6 mg/dL (ref 0.0–40.0)

## 2013-12-02 ENCOUNTER — Ambulatory Visit (INDEPENDENT_AMBULATORY_CARE_PROVIDER_SITE_OTHER): Payer: Medicare Other | Admitting: Cardiology

## 2013-12-02 ENCOUNTER — Encounter: Payer: Self-pay | Admitting: Cardiology

## 2013-12-02 VITALS — BP 134/84 | HR 64 | Ht 73.0 in | Wt 200.4 lb

## 2013-12-02 DIAGNOSIS — I2510 Atherosclerotic heart disease of native coronary artery without angina pectoris: Secondary | ICD-10-CM

## 2013-12-02 DIAGNOSIS — I252 Old myocardial infarction: Secondary | ICD-10-CM

## 2013-12-02 DIAGNOSIS — E785 Hyperlipidemia, unspecified: Secondary | ICD-10-CM | POA: Diagnosis not present

## 2013-12-02 DIAGNOSIS — I208 Other forms of angina pectoris: Secondary | ICD-10-CM

## 2013-12-02 DIAGNOSIS — I214 Non-ST elevation (NSTEMI) myocardial infarction: Secondary | ICD-10-CM | POA: Insufficient documentation

## 2013-12-02 MED ORDER — PANTOPRAZOLE SODIUM 40 MG PO TBEC: 40.0000 mg | DELAYED_RELEASE_TABLET | Freq: Every day | ORAL | Status: DC

## 2013-12-02 NOTE — Patient Instructions (Signed)
Your physician recommends that you continue on your current medications as directed. Please refer to the Current Medication list given to you today.  Your physician wants you to follow-up in: 6 months with Dr. Skains. You will receive a reminder letter in the mail two months in advance. If you don't receive a letter, please call our office to schedule the follow-up appointment.  

## 2013-12-02 NOTE — Progress Notes (Signed)
[facility]. [address], [address] [place], [place]  [address] Phone: [phone] Fax:  [phone]  Date:  12/02/2013   ID:  Sean Hammond, DOB 13-Aug-1941, MRN [id number]  PCP:  No primary provider on file.   History of Present Illness: Sean Hammond is a 73 y.o. male with coronary artery disease status post overlapping drug eluting stents to his mid circumflex with prior ejection fraction of 44%. These were placed in January of 2013. CPAP for OSA. Prior nuclear stress test showed base to mid inferior wall ischemia in 2013. This was prior to stent placement in the circumflex artery.  Previously felt some chest pain (not pressure) with exertion and was offered beta blocker or other antianginal medications. He did not want to utilize these at prior visit. 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 Not significant. No associated arm pain or shoulder pain.  Recently had orthopedic injection by Dr. Vickki Hearing.    Wt Readings from Last 3 Encounters:  12/02/13 200 lb 6.4 oz (90.901 kg)  12/14/11 210 lb (95.255 kg)  12/14/11 210 lb (95.255 kg)     Past Medical History  Diagnosis Date  . Silent myocardial infarction   . Shortness of breath on exertion   . GERD (gastroesophageal reflux disease)   . Chronic headaches     "started  09/2011"  . Skin cancer     head, ear, back  . CAD (coronary artery disease)   . Hyperlipidemia   . Leukopenia   . ED (erectile dysfunction)   . Allergic rhinitis   . Insomnia     , post  . Epigastric abdominal pain   . Epicondylitis   . Prostatitis   . Epistaxis   . Osteoarthritis of hand   . Sinusitis   . Lung nodule     , Left upper  . Moderate obstructive sleep apnea     Cpap of 9cm of H2O - 05/2012    Past Surgical History  Procedure Laterality Date  . Coronary angioplasty with stent  placement  12/14/11    "2"  . Knee arthroscopy  ` 2010    left  . Bone cyst excision  ~ 2004    left knee  . Nasal sinus surgery  ~ 2002 & 2003  . Eye surgery  ~ 2010    "both eyes; reshape pupil"  . Skin cancer excision      "back; head; ears"    Current Outpatient Prescriptions  Medication Sig Dispense Refill  . aspirin 81 MG tablet Take 81 mg by mouth daily.      . clopidogrel (PLAVIX) 75 MG tablet Take 75 mg by mouth daily with breakfast.      . diphenhydrAMINE (BENADRYL) 25 MG tablet Take 25 mg by mouth as needed.      Marland Kitchen GLUCOSAMINE HCL PO Take 3 tablets by mouth 3 (three) times daily.      . Multiple Vitamins-Minerals (MULTIVITAMIN) LIQD Take 2 oz by mouth daily.      . pantoprazole (PROTONIX) 40 MG tablet Take 1 tablet (40 mg total) by mouth daily at 12 noon.  30 tablet  12  . rosuvastatin (CRESTOR) 5 MG tablet Take 5 mg by mouth daily.      . traMADol (ULTRAM) 50 MG tablet Take 50 mg  by mouth every 12 (twelve) hours as needed.      . nitroGLYCERIN (NITROSTAT) 0.4 MG SL tablet Place 1 tablet (0.4 mg total) under the tongue every 5 (five) minutes as needed for chest pain.  30 tablet  12   No current facility-administered medications for this visit.    Allergies:    Allergies  Allergen Reactions  . Percocet [Oxycodone-Acetaminophen] Itching    Social History:  The patient  reports that he has quit smoking. His smoking use included Cigarettes. He has a 24 pack-year smoking history. He has never used smokeless tobacco. He reports that he drinks alcohol. He reports that he does not use illicit drugs.   ROS:  Please see the history of present illness.   Denies any syncope, bleeding, orthopnea, PND  PHYSICAL EXAM: VS:  BP 134/84  Pulse 64  Ht 6\' 1"  (1.854 m)  Wt 200 lb 6.4 oz (90.901 kg)  BMI 26.45 kg/m2 Well nourished, well developed, in no acute distress HEENT: normal Neck: no JVD Cardiac:  normal S1, S2; RRR; no murmur Lungs:  clear to auscultation bilaterally, no  wheezing, rhonchi or rales Abd: soft, nontender, no hepatomegaly Ext: no edema Skin: warm and dry Neuro: no focal abnormalities noted  EKG:  Sinus rhythm rate 64, no other abnormalities.   ASSESSMENT AND PLAN:  1. Coronary artery disease-circumflex stenting. Aspirin. He has been off of clopidogrel one year post stent. 2. Angina-occasional, stable angina. Nitroglycerin. Currently doing well. Did not mention chest discomfort during this office visit. Occasional shortness of breath. Continue with conditioning. 3. Old myocardial infarction-previously silent. 4. Hyperlipidemia - Crestor. Reassuring lipid panel. LDL less than 100. 86. No changes made.  Signed, Candee Furbish, MD [facility]  12/02/2013 8:56 AM

## 2013-12-10 DIAGNOSIS — R51 Headache: Secondary | ICD-10-CM | POA: Diagnosis not present

## 2013-12-10 DIAGNOSIS — R5383 Other fatigue: Secondary | ICD-10-CM | POA: Diagnosis not present

## 2013-12-10 DIAGNOSIS — Z0000 Encounter for general adult medical examination without abnormal findings: Secondary | ICD-10-CM | POA: Diagnosis not present

## 2013-12-10 DIAGNOSIS — M542 Cervicalgia: Secondary | ICD-10-CM | POA: Diagnosis not present

## 2013-12-10 DIAGNOSIS — E782 Mixed hyperlipidemia: Secondary | ICD-10-CM | POA: Diagnosis not present

## 2013-12-10 DIAGNOSIS — E559 Vitamin D deficiency, unspecified: Secondary | ICD-10-CM | POA: Diagnosis not present

## 2013-12-10 DIAGNOSIS — R972 Elevated prostate specific antigen [PSA]: Secondary | ICD-10-CM | POA: Diagnosis not present

## 2013-12-10 DIAGNOSIS — Z1331 Encounter for screening for depression: Secondary | ICD-10-CM | POA: Diagnosis not present

## 2013-12-10 DIAGNOSIS — R5381 Other malaise: Secondary | ICD-10-CM | POA: Diagnosis not present

## 2013-12-10 DIAGNOSIS — Z79899 Other long term (current) drug therapy: Secondary | ICD-10-CM | POA: Diagnosis not present

## 2014-02-09 DIAGNOSIS — M19079 Primary osteoarthritis, unspecified ankle and foot: Secondary | ICD-10-CM | POA: Diagnosis not present

## 2014-02-25 DIAGNOSIS — H6980 Other specified disorders of Eustachian tube, unspecified ear: Secondary | ICD-10-CM | POA: Diagnosis not present

## 2014-02-25 DIAGNOSIS — J00 Acute nasopharyngitis [common cold]: Secondary | ICD-10-CM | POA: Diagnosis not present

## 2014-03-04 ENCOUNTER — Other Ambulatory Visit: Payer: Self-pay

## 2014-03-04 MED ORDER — NITROGLYCERIN 0.4 MG SL SUBL: 0.4000 mg | SUBLINGUAL_TABLET | SUBLINGUAL | Status: DC | PRN

## 2014-03-12 DIAGNOSIS — R233 Spontaneous ecchymoses: Secondary | ICD-10-CM | POA: Diagnosis not present

## 2014-03-12 DIAGNOSIS — L570 Actinic keratosis: Secondary | ICD-10-CM | POA: Diagnosis not present

## 2014-04-06 DIAGNOSIS — M1710 Unilateral primary osteoarthritis, unspecified knee: Secondary | ICD-10-CM | POA: Diagnosis not present

## 2014-04-21 ENCOUNTER — Other Ambulatory Visit: Payer: Self-pay | Admitting: *Deleted

## 2014-04-21 MED ORDER — ROSUVASTATIN CALCIUM 5 MG PO TABS: 5.0000 mg | ORAL_TABLET | Freq: Every day | ORAL | Status: DC

## 2014-04-27 DIAGNOSIS — J019 Acute sinusitis, unspecified: Secondary | ICD-10-CM | POA: Diagnosis not present

## 2014-04-27 DIAGNOSIS — J309 Allergic rhinitis, unspecified: Secondary | ICD-10-CM | POA: Diagnosis not present

## 2014-05-11 DIAGNOSIS — M1710 Unilateral primary osteoarthritis, unspecified knee: Secondary | ICD-10-CM | POA: Diagnosis not present

## 2014-05-13 DIAGNOSIS — J019 Acute sinusitis, unspecified: Secondary | ICD-10-CM | POA: Diagnosis not present

## 2014-05-18 DIAGNOSIS — M1710 Unilateral primary osteoarthritis, unspecified knee: Secondary | ICD-10-CM | POA: Diagnosis not present

## 2014-05-25 DIAGNOSIS — M1710 Unilateral primary osteoarthritis, unspecified knee: Secondary | ICD-10-CM | POA: Diagnosis not present

## 2014-05-31 DIAGNOSIS — L509 Urticaria, unspecified: Secondary | ICD-10-CM | POA: Diagnosis not present

## 2014-05-31 DIAGNOSIS — Z79899 Other long term (current) drug therapy: Secondary | ICD-10-CM | POA: Diagnosis not present

## 2014-05-31 DIAGNOSIS — I10 Essential (primary) hypertension: Secondary | ICD-10-CM | POA: Diagnosis not present

## 2014-05-31 DIAGNOSIS — E7800 Pure hypercholesterolemia, unspecified: Secondary | ICD-10-CM | POA: Diagnosis not present

## 2014-05-31 DIAGNOSIS — K219 Gastro-esophageal reflux disease without esophagitis: Secondary | ICD-10-CM | POA: Diagnosis not present

## 2014-06-04 DIAGNOSIS — N368 Other specified disorders of urethra: Secondary | ICD-10-CM | POA: Diagnosis not present

## 2014-06-04 DIAGNOSIS — G4762 Sleep related leg cramps: Secondary | ICD-10-CM | POA: Diagnosis not present

## 2014-06-04 DIAGNOSIS — R5381 Other malaise: Secondary | ICD-10-CM | POA: Diagnosis not present

## 2014-06-04 DIAGNOSIS — R109 Unspecified abdominal pain: Secondary | ICD-10-CM | POA: Diagnosis not present

## 2014-06-04 DIAGNOSIS — R972 Elevated prostate specific antigen [PSA]: Secondary | ICD-10-CM | POA: Diagnosis not present

## 2014-06-04 DIAGNOSIS — M542 Cervicalgia: Secondary | ICD-10-CM | POA: Diagnosis not present

## 2014-06-04 DIAGNOSIS — I2510 Atherosclerotic heart disease of native coronary artery without angina pectoris: Secondary | ICD-10-CM | POA: Diagnosis not present

## 2014-06-04 DIAGNOSIS — G4733 Obstructive sleep apnea (adult) (pediatric): Secondary | ICD-10-CM | POA: Diagnosis not present

## 2014-06-04 DIAGNOSIS — R5383 Other fatigue: Secondary | ICD-10-CM | POA: Diagnosis not present

## 2014-06-08 ENCOUNTER — Ambulatory Visit (INDEPENDENT_AMBULATORY_CARE_PROVIDER_SITE_OTHER): Payer: Medicare Other | Admitting: Cardiology

## 2014-06-08 ENCOUNTER — Encounter: Payer: Self-pay | Admitting: Cardiology

## 2014-06-08 VITALS — BP 126/80 | HR 80 | Ht 73.0 in | Wt 201.0 lb

## 2014-06-08 DIAGNOSIS — G4733 Obstructive sleep apnea (adult) (pediatric): Secondary | ICD-10-CM

## 2014-06-08 DIAGNOSIS — R079 Chest pain, unspecified: Secondary | ICD-10-CM

## 2014-06-08 DIAGNOSIS — I208 Other forms of angina pectoris: Secondary | ICD-10-CM

## 2014-06-08 DIAGNOSIS — I2510 Atherosclerotic heart disease of native coronary artery without angina pectoris: Secondary | ICD-10-CM

## 2014-06-08 DIAGNOSIS — E785 Hyperlipidemia, unspecified: Secondary | ICD-10-CM | POA: Diagnosis not present

## 2014-06-08 DIAGNOSIS — I2089 Other forms of angina pectoris: Secondary | ICD-10-CM

## 2014-06-08 DIAGNOSIS — I252 Old myocardial infarction: Secondary | ICD-10-CM | POA: Diagnosis not present

## 2014-06-08 NOTE — Patient Instructions (Signed)
The current medical regimen is effective;  continue present plan and medications.  Your physician has requested that you have a lexiscan myoview. For further information please visit [email or web address]. Please follow instruction sheet, as given.  Follow up in 6 months with Dr [name].  You will receive a letter in the mail 2 months before you are due.  Please call us when you receive this letter to schedule your follow up appointment.

## 2014-06-08 NOTE — Progress Notes (Signed)
[facility]. [address], [address] [place], [place]  [address] Phone: [phone] Fax:  [phone]  Date:  06/08/2014   ID:  Sean Hammond, DOB [birth year], MRN [id number]  PCP:  No primary provider on file.   History of Present Illness: Sean Hammond is a 73 y.o. male with coronary artery disease status post overlapping drug eluting stents to his mid circumflex with prior ejection fraction of 44%. These were placed in January of 2013. CPAP for OSA. Prior nuclear stress test showed base to mid inferior wall ischemia in 2013. This was prior to stent placement in the circumflex artery.  Previously felt some chest pain with exertion as well as rest, sometimes when laying down, moderate in intensity resulting in a few minutes. Previously was offered beta blocker or other antianginal medications. He did not want to utilize these at prior visit. 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 Not significant. No associated arm pain or shoulder pain.  His wife has rheumatoid arthritis. Interested in getting a hot tub.  Recently had orthopedic injection by Dr. Vickki Hearing.    Wt Readings from Last 3 Encounters:  06/08/14 201 lb (91.173 kg)  12/02/13 200 lb 6.4 oz (90.901 kg)  12/14/11 210 lb (95.255 kg)     Past Medical History  Diagnosis Date  . Silent myocardial infarction   . Shortness of breath on exertion   . GERD (gastroesophageal reflux disease)   . Chronic headaches     "started  09/2011"  . Skin cancer     head, ear, back  . CAD (coronary artery disease)   . Hyperlipidemia   . Leukopenia   . ED (erectile dysfunction)   . Allergic rhinitis   . Insomnia     , post  . Epigastric abdominal pain   . Epicondylitis   . Prostatitis   . Epistaxis   . Osteoarthritis of hand   . Sinusitis   . Lung nodule     , Left upper  .  Moderate obstructive sleep apnea     Cpap of 9cm of H2O - 05/2012    Past Surgical History  Procedure Laterality Date  . Coronary angioplasty with stent placement  12/14/11    "2"  . Knee arthroscopy  ` 2010    left  . Bone cyst excision  ~ 2004    left knee  . Nasal sinus surgery  ~ 2002 & 2003  . Eye surgery  ~ 2010    "both eyes; reshape pupil"  . Skin cancer excision      "back; head; ears"    Current Outpatient Prescriptions  Medication Sig Dispense Refill  . ammonium lactate (LAC-HYDRIN) 12 % lotion as needed.      Marland Kitchen aspirin 81 MG tablet Take 81 mg by mouth daily.      . diphenhydrAMINE (BENADRYL) 25 MG tablet Take 25 mg by mouth as needed.      Marland Kitchen EPIPEN 2-PAK 0.3 MG/0.3ML SOAJ injection as needed.      Marland Kitchen GLUCOSAMINE HCL PO Take 1 tablet by mouth daily.       . Multiple Vitamin (MULTIVITAMIN) capsule Take 1 capsule by mouth daily.      . nitroGLYCERIN (NITROSTAT) 0.4 MG SL tablet Place 1 tablet (0.4 mg total) under the tongue every  5 (five) minutes as needed for chest pain.  30 tablet  6  . pantoprazole (PROTONIX) 40 MG tablet Take 1 tablet (40 mg total) by mouth daily at 12 noon.  30 tablet  12  . rosuvastatin (CRESTOR) 5 MG tablet Take 1 tablet (5 mg total) by mouth daily.  30 tablet  1  . traMADol (ULTRAM) 50 MG tablet Take 50 mg by mouth every 12 (twelve) hours as needed.       No current facility-administered medications for this visit.    Allergies:    Allergies  Allergen Reactions  . Percocet [Oxycodone-Acetaminophen] Itching    Social History:  The patient  reports that he has quit smoking. His smoking use included Cigarettes. He has a 24 pack-year smoking history. He has never used smokeless tobacco. He reports that he drinks alcohol. He reports that he does not use illicit drugs.   ROS:  Please see the history of present illness.   Denies any syncope, bleeding, orthopnea, PND  PHYSICAL EXAM: VS:  BP 126/80  Pulse 80  Ht 6\' 1"  (1.854 m)  Wt 201 lb (91.173  kg)  BMI 26.52 kg/m2 Well nourished, well developed, in no acute distress HEENT: normal Neck: no JVD Cardiac:  normal S1, S2; RRR; no murmur Lungs:  clear to auscultation bilaterally, no wheezing, rhonchi or rales Abd: soft, nontender, no hepatomegaly Ext: no edema Skin: warm and dry Neuro: no focal abnormalities noted  EKG:  06/08/14 -Sinus rhythm rate 80, no other abnormalities.  Labs: 11/26/13-LDL 86, ALT 27 Nuclear stress test: 2013-inferior wall infarct/ ischemia-EF 44%  ASSESSMENT AND PLAN:  1. Coronary artery disease-circumflex stenting. Aspirin. He has been off of clopidogrel one year post stent. 2. Angina-occasional, moderate degree chest tightness across chest wall sometimes occurring when laying down but not always. Does not appear to be exertional. He is concerned because of his generalized malaise, fatigue that this may be cardiac related. Nitroglycerin has not been used. Occasional shortness of breath. We'll go ahead and proceed with nuclear stress test. He has not been on a beta blocker because of underlying fatigue. 3. Fatigue-certainly could be multifactorial. He has had workup previously by Dr. Inda Merlin. 4. Old myocardial infarction-previously silent. 5. Hyperlipidemia - Crestor. Reassuring lipid panel. LDL less than 100. 86. No changes made. 6. Followup 6 months  Signed, Candee Furbish, MD [facility]  06/08/2014 3:26 PM

## 2014-06-16 ENCOUNTER — Ambulatory Visit (HOSPITAL_COMMUNITY): Payer: Medicare Other | Attending: Cardiology | Admitting: Radiology

## 2014-06-16 VITALS — BP 138/83 | Ht 73.0 in | Wt 199.0 lb

## 2014-06-16 DIAGNOSIS — I2510 Atherosclerotic heart disease of native coronary artery without angina pectoris: Secondary | ICD-10-CM | POA: Diagnosis not present

## 2014-06-16 DIAGNOSIS — Z8249 Family history of ischemic heart disease and other diseases of the circulatory system: Secondary | ICD-10-CM | POA: Diagnosis not present

## 2014-06-16 DIAGNOSIS — R079 Chest pain, unspecified: Secondary | ICD-10-CM | POA: Insufficient documentation

## 2014-06-16 DIAGNOSIS — R0602 Shortness of breath: Secondary | ICD-10-CM

## 2014-06-16 DIAGNOSIS — I252 Old myocardial infarction: Secondary | ICD-10-CM

## 2014-06-16 DIAGNOSIS — R0609 Other forms of dyspnea: Secondary | ICD-10-CM | POA: Diagnosis not present

## 2014-06-16 DIAGNOSIS — R0989 Other specified symptoms and signs involving the circulatory and respiratory systems: Secondary | ICD-10-CM | POA: Diagnosis not present

## 2014-06-16 DIAGNOSIS — Z9861 Coronary angioplasty status: Secondary | ICD-10-CM | POA: Insufficient documentation

## 2014-06-16 MED ORDER — TECHNETIUM TC 99M SESTAMIBI GENERIC - CARDIOLITE
11.0000 | Freq: Once | INTRAVENOUS | Status: AC | PRN
  Administered 2014-06-16: 11 via INTRAVENOUS

## 2014-06-16 MED ORDER — TECHNETIUM TC 99M SESTAMIBI GENERIC - CARDIOLITE
33.0000 | Freq: Once | INTRAVENOUS | Status: AC | PRN
  Administered 2014-06-16: 33 via INTRAVENOUS

## 2014-06-16 NOTE — Progress Notes (Signed)
[facility] [address] [place], [place] [address] [phone]    Cardiology Nuclear Med Study  Sean Hammond is a 73 y.o. male     MRN : [id number]     DOB: [birth year]  Procedure Date: 06/16/2014  Nuclear Med Background Indication for Stress Test:  Evaluation for Ischemia and Stent Patency History:CAD;MI;Cath;Stent MID CFX1/13';Previous Nuclear Study 13'> Stent,Ischemia 44%, base to mid inferior wall ischemia Cardiac Risk Factors: Family History - CAD, History of Smoking and Lipids  Symptoms:  Chest Pain and DOE   Nuclear Pre-Procedure Caffeine/Decaff Intake:  None NPO After: 7:00pm   Lungs:  clear O2 Sat: 98% on room air. IV 0.9% NS with Angio Cath:  20g  IV Site: R Hand  IV Started by:  Matilde Haymaker, RN  Chest Size (in):  44 Cup Size: n/a  Height: 6\' 1"  (1.854 m)  Weight:  199 lb (90.266 kg)  BMI:  Body mass index is 26.26 kg/(m^2). Tech Comments:  n/a    Nuclear Med Study 1 or 2 day study: 1 day  Stress Test Type:  Stress  Reading MD: n/a  Order Authorizing Provider:  Wallene Huh  Resting Radionuclide: Technetium 100m Sestamibi  Resting Radionuclide Dose: 11.0 mCi   Stress Radionuclide:  Technetium 32m Sestamibi  Stress Radionuclide Dose: 33.0 mCi           Stress Protocol Rest HR: 68 Stress HR: 141  Rest BP: 138/83 Stress BP: 176/78  Exercise Time (min): 4:00 METS: 5.80   Predicted Max HR: 150 bpm % Max HR: 94 bpm Rate Pressure Product: 24816   Dose of Adenosine (mg):  n/a Dose of Lexiscan: n/a mg  Dose of Atropine (mg): n/a Dose of Dobutamine: n/a mcg/kg/min (at max HR)  Stress Test Technologist: Ileene Hutchinson, EMT-P  Nuclear Technologist:  Vedia Pereyra, CNMT     Rest Procedure:  Myocardial perfusion imaging was performed at rest 45 minutes following the intravenous administration of Technetium 57m Sestamibi. Rest ECG: Normal sinus rhythm. Normal EKG.  Stress Procedure:  The patient exercised on the treadmill  utilizing the Bruce Protocol for 4:00 minutes. The patient stopped due to sob and denied any chest pain.  Technetium 13m Sestamibi was injected at peak exercise and myocardial perfusion imaging was performed after a brief delay. Stress ECG: No significant change from baseline ECG  QPS Raw Data Images:  Normal; no motion artifact; normal heart/lung ratio. Stress Images:  There is a small area of mild decreased uptake in the inferior segments. This is more marked visually that it is by the quantitative analysis. This appears partially reversible. Rest Images:  Very slight area of decreased activity in the inferior segments. Subtraction (SDS):  Question very slight reversibility in the inferior segments. Transient Ischemic Dilatation (Normal <1.22):  0.89 Lung/Heart Ratio (Normal <0.45):  0.32  Quantitative Gated Spect Images QGS EDV:  124 ml QGS ESV:  60 ml  Impression Exercise Capacity:  Patient has very limited exercise tolerance. There was shortness of breath and a low-level stress. BP Response:  Normal blood pressure response. Clinical Symptoms:  Shortness of breath ECG Impression:  No significant ST segment change suggestive of ischemia. Comparison with Prior Nuclear Study: No images to compare  Overall Impression:  The study is suggestive of some inferior ischemia. This is very minor by quantitative analysis. Visually it appears somewhat more marked. I believe there is some inferior ischemia. This would be considered a low risk scan because of the  small number of segments.  LV Ejection Fraction: 52%.  LV Wall Motion:  Normal Wall Motion.  Dola Argyle, MD

## 2014-06-17 ENCOUNTER — Other Ambulatory Visit: Payer: Self-pay | Admitting: Cardiology

## 2014-06-23 ENCOUNTER — Other Ambulatory Visit: Payer: Self-pay | Admitting: Cardiology

## 2014-06-29 ENCOUNTER — Telehealth: Payer: Self-pay | Admitting: Cardiology

## 2014-06-29 NOTE — Telephone Encounter (Signed)
Called to review testing results with pt.  Per wife - he just left.  Attempted to contact him on his cell phone per wife's request and was sent to voicemail.  Will continue to attempt to contact.

## 2014-06-29 NOTE — Telephone Encounter (Signed)
New message    Calling from test results

## 2014-07-15 DIAGNOSIS — T7800XA Anaphylactic reaction due to unspecified food, initial encounter: Secondary | ICD-10-CM | POA: Diagnosis not present

## 2014-07-15 DIAGNOSIS — L500 Allergic urticaria: Secondary | ICD-10-CM | POA: Diagnosis not present

## 2014-07-15 DIAGNOSIS — L509 Urticaria, unspecified: Secondary | ICD-10-CM | POA: Diagnosis not present

## 2014-07-18 DIAGNOSIS — M79609 Pain in unspecified limb: Secondary | ICD-10-CM | POA: Diagnosis not present

## 2014-07-18 DIAGNOSIS — M1710 Unilateral primary osteoarthritis, unspecified knee: Secondary | ICD-10-CM | POA: Diagnosis not present

## 2014-07-20 ENCOUNTER — Other Ambulatory Visit (HOSPITAL_COMMUNITY): Payer: Self-pay | Admitting: Orthopedic Surgery

## 2014-07-20 DIAGNOSIS — M79606 Pain in leg, unspecified: Secondary | ICD-10-CM

## 2014-07-20 DIAGNOSIS — M1710 Unilateral primary osteoarthritis, unspecified knee: Secondary | ICD-10-CM | POA: Diagnosis not present

## 2014-07-23 DIAGNOSIS — M1710 Unilateral primary osteoarthritis, unspecified knee: Secondary | ICD-10-CM | POA: Diagnosis not present

## 2014-07-31 ENCOUNTER — Encounter (HOSPITAL_COMMUNITY)
Admission: RE | Admit: 2014-07-31 | Discharge: 2014-07-31 | Disposition: A | Discharge status: Home / Self Care | Payer: Medicare Other | Source: Ambulatory Visit | Attending: Orthopedic Surgery | Admitting: Orthopedic Surgery

## 2014-07-31 ENCOUNTER — Ambulatory Visit (HOSPITAL_COMMUNITY)
Admission: RE | Admit: 2014-07-31 | Discharge: 2014-07-31 | Disposition: A | Discharge status: Home / Self Care | Payer: Medicare Other | Source: Ambulatory Visit | Attending: Orthopedic Surgery | Admitting: Orthopedic Surgery

## 2014-07-31 DIAGNOSIS — M899 Disorder of bone, unspecified: Secondary | ICD-10-CM | POA: Diagnosis not present

## 2014-07-31 DIAGNOSIS — M949 Disorder of cartilage, unspecified: Principal | ICD-10-CM

## 2014-07-31 DIAGNOSIS — M79606 Pain in leg, unspecified: Secondary | ICD-10-CM

## 2014-07-31 DIAGNOSIS — C4490 Unspecified malignant neoplasm of skin, unspecified: Secondary | ICD-10-CM | POA: Diagnosis not present

## 2014-07-31 MED ORDER — TECHNETIUM TC 99M MEDRONATE IV KIT
26.0000 | PACK | Freq: Once | INTRAVENOUS | Status: AC | PRN
  Administered 2014-07-31: 26 via INTRAVENOUS

## 2014-08-10 DIAGNOSIS — M79609 Pain in unspecified limb: Secondary | ICD-10-CM | POA: Diagnosis not present

## 2014-08-20 DIAGNOSIS — J312 Chronic pharyngitis: Secondary | ICD-10-CM | POA: Diagnosis not present

## 2014-08-31 ENCOUNTER — Ambulatory Visit (INDEPENDENT_AMBULATORY_CARE_PROVIDER_SITE_OTHER): Payer: Medicare Other | Admitting: Cardiology

## 2014-08-31 ENCOUNTER — Encounter: Payer: Self-pay | Admitting: Cardiology

## 2014-08-31 VITALS — BP 122/80 | HR 62 | Ht 73.0 in | Wt 203.0 lb

## 2014-08-31 DIAGNOSIS — I208 Other forms of angina pectoris: Secondary | ICD-10-CM

## 2014-08-31 DIAGNOSIS — Z23 Encounter for immunization: Secondary | ICD-10-CM | POA: Diagnosis not present

## 2014-08-31 DIAGNOSIS — I252 Old myocardial infarction: Secondary | ICD-10-CM | POA: Diagnosis not present

## 2014-08-31 DIAGNOSIS — R0602 Shortness of breath: Secondary | ICD-10-CM

## 2014-08-31 DIAGNOSIS — I25118 Atherosclerotic heart disease of native coronary artery with other forms of angina pectoris: Secondary | ICD-10-CM

## 2014-08-31 DIAGNOSIS — I2089 Other forms of angina pectoris: Secondary | ICD-10-CM

## 2014-08-31 DIAGNOSIS — E785 Hyperlipidemia, unspecified: Secondary | ICD-10-CM

## 2014-08-31 MED ORDER — ISOSORBIDE MONONITRATE ER 30 MG PO TB24: 30.0000 mg | ORAL_TABLET | Freq: Every day | ORAL | Status: DC

## 2014-08-31 MED ORDER — PITAVASTATIN CALCIUM 2 MG PO TABS: 2.0000 mg | ORAL_TABLET | Freq: Every day | ORAL | Status: DC

## 2014-08-31 MED ORDER — METOPROLOL SUCCINATE ER 25 MG PO TB24: 25.0000 mg | ORAL_TABLET | Freq: Every day | ORAL | Status: DC

## 2014-08-31 NOTE — Progress Notes (Signed)
[facility]. [address], [address] [place], [place]  [address] Phone: [phone] Fax:  [phone]  Date:  08/31/2014   ID:  Sean Hammond, DOB [birth year], MRN [id number]  PCP:  Henrine Screws, MD   History of Present Illness: Sean Hammond is a 73 y.o. male with coronary artery disease status post overlapping drug eluting stents to his mid circumflex with prior ejection fraction of 44%. These were placed in January of 2013. CPAP for OSA. Prior nuclear stress test showed base to mid inferior wall ischemia in 2013. This was prior to stent placement in the circumflex artery.  Previously felt some chest pain with exertion as well as rest, sometimes when laying down, moderate in intensity resulting in a few minutes. Previously was offered beta blocker or other antianginal medications. He did not want to utilize these at prior visit. Today, he is willing to try these medications. He has stopped Crestor because of leg pains, flank pain. He thinks that his leg discomfort is getting better. He has seen Dr. Gladstone Lighter in the past who has also worked up gallbladder disease, normal.   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 Not significant. No associated arm pain or shoulder pain. Ultimately, it is very hard for him to decide whether or not he is actually having true anginal symptoms.  Could not tolerate Crestor. Hurt so bad.   His wife is worried about him having a heart attack.  Wt Readings from Last 3 Encounters:  08/31/14 203 lb (92.08 kg)  06/16/14 199 lb (90.266 kg)  06/08/14 201 lb (91.173 kg)     Past Medical History  Diagnosis Date  . Silent myocardial infarction   . Shortness of breath on exertion   . GERD (gastroesophageal reflux disease)   . Chronic headaches     "started  09/2011"  . Skin cancer     head, ear, back  .  CAD (coronary artery disease)   . Hyperlipidemia   . Leukopenia   . ED (erectile dysfunction)   . Allergic rhinitis   . Insomnia     , post  . Epigastric abdominal pain   . Epicondylitis   . Prostatitis   . Epistaxis   . Osteoarthritis of hand   . Sinusitis   . Lung nodule     , Left upper  . Moderate obstructive sleep apnea     Cpap of 9cm of H2O - 05/2012    Past Surgical History  Procedure Laterality Date  . Coronary angioplasty with stent placement  12/14/11    "2"  . Knee arthroscopy  ` 2010    left  . Bone cyst excision  ~ 2004    left knee  . Nasal sinus surgery  ~ 2002 & 2003  . Eye surgery  ~ 2010    "both eyes; reshape pupil"  . Skin cancer excision      "back; head; ears"    Current Outpatient Prescriptions  Medication Sig Dispense Refill  . aspirin 81 MG tablet Take 81 mg by mouth daily.      . Cholecalciferol (VITAMIN D HIGH POTENCY) 1000 UNITS capsule Take 1,000 Units by mouth daily.      . diphenhydrAMINE (BENADRYL) 25 MG tablet Take 25 mg by mouth as needed.      Marland Kitchen  EPIPEN 2-PAK 0.3 MG/0.3ML SOAJ injection as needed.      . Multiple Vitamin (MULTIVITAMIN) capsule Take 1 capsule by mouth daily.      . nitroGLYCERIN (NITROSTAT) 0.4 MG SL tablet Place 1 tablet (0.4 mg total) under the tongue every 5 (five) minutes as needed for chest pain.  30 tablet  6  . pantoprazole (PROTONIX) 40 MG tablet Take 1 tablet (40 mg total) by mouth daily at 12 noon.  30 tablet  12  . traMADol (ULTRAM) 50 MG tablet Take 50 mg by mouth every 12 (twelve) hours as needed.       No current facility-administered medications for this visit.    Allergies:    Allergies  Allergen Reactions  . Beef-Derived Products   . Percocet [Oxycodone-Acetaminophen] Itching  . Pork-Derived Products     Social History:  The patient  reports that he has quit smoking. His smoking use included Cigarettes. He has a 24 pack-year smoking history. He has never used smokeless tobacco. He reports that he  drinks alcohol. He reports that he does not use illicit drugs.   ROS:  Please see the history of present illness.   Denies any syncope, bleeding, orthopnea, PND  PHYSICAL EXAM: VS:  BP 122/80  Pulse 62  Ht 6\' 1"  (1.854 m)  Wt 203 lb (92.08 kg)  BMI 26.79 kg/m2  SpO2 98% Well nourished, well developed, in no acute distress HEENT: normal Neck: no JVD Cardiac:  normal S1, S2; RRR; no murmur Lungs:  clear to auscultation bilaterally, no wheezing, rhonchi or rales Abd: soft, nontender, no hepatomegaly Ext: no edema Skin: warm and dry Neuro: no focal abnormalities noted       EKG:  06/08/14 -Sinus rhythm rate 80, no other abnormalities.  Labs: 11/26/13-LDL 86, ALT 27 Nuclear stress test: 2013-inferior wall infarct/ ischemia-EF 44%  ASSESSMENT AND PLAN:  1. Coronary artery disease-prior circumflex stenting. Aspirin. He has been off of clopidogrel one year post stent. 2. Angina-challenging for him to discern whether or not this is true cardiac pain or not. Occasional, moderate degree chest tightness across chest wall sometimes occurring when laying down but not always. Does not appear to be exertional. He is concerned because of his generalized malaise, fatigue that this may be cardiac related. Nitroglycerin has not been used. Occasional shortness of breath. Nuclear stress test showed inferior ischemia/scar. He has not been on a beta blocker because of underlying fatigue but is now willing to trial low-dose Toprol 25 mg and Imdur 30 mg. If he continues to have ongoing symptoms despite this therapy, we will proceed likely with cardiac catheterization. I did explain to them that if a stent were placed during cardiac catheterization this would be for symptom relief. This would not prevent future heart attacks. The best thing to prevent future heart attacks are diet, exercise, statin medications. I'm going to trial Livalo 2mg  which is a better tolerated statin. 3. Fatigue-certainly could be  multifactorial. He has had workup previously by Dr. Inda Merlin. 4. Old myocardial infarction-previously silent. 5. Hyperlipidemia - Did not tolerate Crestor. Previous reassuring lipid panel on Crestor with LDL less than 100. 86. Trying Livalo 2mg . 6. Followup 1 month  Signed, Candee Furbish, MD [facility]  08/31/2014 4:32 PM

## 2014-08-31 NOTE — Patient Instructions (Signed)
Please start Isosorbide 30 mg a day, metoprolol 25 mg a day and Livalo 2 mg a day. Continue all other medications as listed.   Follow up in 1 month with Dr [name].

## 2014-09-03 ENCOUNTER — Telehealth: Payer: Self-pay | Admitting: Cardiology

## 2014-09-03 NOTE — Telephone Encounter (Signed)
New message     Pt states his metoprolol and isosorbide is making him have a headache.  Can he cut the tablets in half?

## 2014-09-03 NOTE — Telephone Encounter (Signed)
Patient started on Imdur 30mg  daily in am, and Metoprolol 25XL daily in the evening. Has had a horrible headache ever since starting both of these. Rates it as almost a "10" . Wants to know if he can take 1/2 of one or both of these, and work back up to a whole tablet.

## 2014-09-04 NOTE — Telephone Encounter (Signed)
Spoke with pt about h/a.  Explained to him how Imdur works and it's purpose.  He states understanding.  Advised he can try taking 1/2 tablet and take ASA or Tylenol about 30 mins before.  He also might want to try taking it at bedtime to sleep thru the h/a.  He will try this and call back if h/a continues.

## 2014-09-09 NOTE — Telephone Encounter (Signed)
Agree. Javan Gonzaga, MD  

## 2014-09-18 DIAGNOSIS — M5030 Other cervical disc degeneration, unspecified cervical region: Secondary | ICD-10-CM | POA: Diagnosis not present

## 2014-09-18 DIAGNOSIS — M542 Cervicalgia: Secondary | ICD-10-CM | POA: Diagnosis not present

## 2014-09-18 DIAGNOSIS — M47812 Spondylosis without myelopathy or radiculopathy, cervical region: Secondary | ICD-10-CM | POA: Diagnosis not present

## 2014-09-30 DIAGNOSIS — H2513 Age-related nuclear cataract, bilateral: Secondary | ICD-10-CM | POA: Diagnosis not present

## 2014-09-30 DIAGNOSIS — H5203 Hypermetropia, bilateral: Secondary | ICD-10-CM | POA: Diagnosis not present

## 2014-10-01 ENCOUNTER — Encounter: Payer: Self-pay | Admitting: Cardiology

## 2014-10-01 ENCOUNTER — Ambulatory Visit (INDEPENDENT_AMBULATORY_CARE_PROVIDER_SITE_OTHER): Payer: Medicare Other | Admitting: Cardiology

## 2014-10-01 VITALS — BP 116/80 | HR 62 | Ht 73.0 in | Wt 199.0 lb

## 2014-10-01 DIAGNOSIS — I252 Old myocardial infarction: Secondary | ICD-10-CM

## 2014-10-01 DIAGNOSIS — G4733 Obstructive sleep apnea (adult) (pediatric): Secondary | ICD-10-CM

## 2014-10-01 DIAGNOSIS — E785 Hyperlipidemia, unspecified: Secondary | ICD-10-CM

## 2014-10-01 DIAGNOSIS — I2510 Atherosclerotic heart disease of native coronary artery without angina pectoris: Secondary | ICD-10-CM

## 2014-10-01 DIAGNOSIS — I208 Other forms of angina pectoris: Secondary | ICD-10-CM

## 2014-10-01 DIAGNOSIS — I2089 Other forms of angina pectoris: Secondary | ICD-10-CM

## 2014-10-01 MED ORDER — ISOSORBIDE MONONITRATE ER 30 MG PO TB24: 30.0000 mg | ORAL_TABLET | ORAL | Status: DC | PRN

## 2014-10-01 MED ORDER — PITAVASTATIN CALCIUM 2 MG PO TABS: ORAL_TABLET | ORAL | Status: DC

## 2014-10-01 NOTE — Patient Instructions (Signed)
Your physician recommends that you schedule a follow-up appointment in:  3 months with Dr. Skains   

## 2014-10-01 NOTE — Progress Notes (Signed)
[facility]. [address], [address] [place], [place]  [address] Phone: [phone] Fax:  [phone]  Date:  10/01/2014   ID:  Sean Hammond, DOB 1940/12/07, MRN [id number]  PCP:  Henrine Screws, MD   History of Present Illness: Sean Hammond is a 73 y.o. male with coronary artery disease status post overlapping drug eluting stents to his mid circumflex with prior ejection fraction of 44%. These were placed in January of 2013. CPAP for OSA. Prior nuclear stress test showed base to mid inferior wall ischemia in 2013. This was prior to stent placement in the circumflex artery.  Previously felt some chest pain with exertion as well as rest, sometimes when laying down, moderate in intensity resulting in a few minutes. Previously was offered beta blocker or other antianginal medications. He did not want to utilize these at prior visit. Today, he is willing to try these medications. He has stopped Crestor because of leg pains, flank pain. He thinks that his leg discomfort is getting better. He has seen Dr. Gladstone Lighter in the past who has also worked up gallbladder disease, normal.   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 Not significant. No associated arm pain or shoulder pain. Ultimately, it is very hard for him to decide whether or not he is actually having true anginal symptoms.  Could not tolerate Crestor. Hurt so bad. Livalo also had issues with. He will try twice a week dose. Enjoying hunting.  His wife is worried about him having a heart attack.  Wt Readings from Last 3 Encounters:  10/01/14 199 lb (90.266 kg)  08/31/14 203 lb (92.08 kg)  06/16/14 199 lb (90.266 kg)     Past Medical History  Diagnosis Date  . Silent myocardial infarction   . Shortness of breath on exertion   . GERD (gastroesophageal reflux disease)   . Chronic  headaches     "started  09/2011"  . Skin cancer     head, ear, back  . CAD (coronary artery disease)   . Hyperlipidemia   . Leukopenia   . ED (erectile dysfunction)   . Allergic rhinitis   . Insomnia     , post  . Epigastric abdominal pain   . Epicondylitis   . Prostatitis   . Epistaxis   . Osteoarthritis of hand   . Sinusitis   . Lung nodule     , Left upper  . Moderate obstructive sleep apnea     Cpap of 9cm of H2O - 05/2012    Past Surgical History  Procedure Laterality Date  . Coronary angioplasty with stent placement  12/14/11    "2"  . Knee arthroscopy  ` 2010    left  . Bone cyst excision  ~ 2004    left knee  . Nasal sinus surgery  ~ 2002 & 2003  . Eye surgery  ~ 2010    "both eyes; reshape pupil"  . Skin cancer excision      "back; head; ears"    Current Outpatient Prescriptions  Medication Sig Dispense Refill  . aspirin 81 MG tablet Take 81 mg by mouth daily.    . Cholecalciferol (VITAMIN D HIGH POTENCY) 1000 UNITS capsule Take 1,000 Units by mouth daily.    . diphenhydrAMINE (BENADRYL) 25 MG tablet Take 25 mg  by mouth as needed.    Marland Kitchen EPIPEN 2-PAK 0.3 MG/0.3ML SOAJ injection as needed.    . isosorbide mononitrate (IMDUR) 30 MG 24 hr tablet Take 1 tablet (30 mg total) by mouth daily. 30 tablet 11  . metoprolol succinate (TOPROL XL) 25 MG 24 hr tablet Take 1 tablet (25 mg total) by mouth daily. 30 tablet 11  . Multiple Vitamin (MULTIVITAMIN) capsule Take 1 capsule by mouth daily.    . nitroGLYCERIN (NITROSTAT) 0.4 MG SL tablet Place 1 tablet (0.4 mg total) under the tongue every 5 (five) minutes as needed for chest pain. 30 tablet 6  . pantoprazole (PROTONIX) 40 MG tablet Take 1 tablet (40 mg total) by mouth daily at 12 noon. 30 tablet 12  . Pitavastatin Calcium 2 MG TABS Take 1 tablet (2 mg total) by mouth daily. 30 tablet 11  . traMADol (ULTRAM) 50 MG tablet Take 50 mg by mouth every 12 (twelve) hours as needed.     No current facility-administered  medications for this visit.    Allergies:    Allergies  Allergen Reactions  . Beef-Derived Products   . Crestor [Rosuvastatin] Other (See Comments)    Muscle ache  . Percocet [Oxycodone-Acetaminophen] Itching  . Pork-Derived Products     Social History:  The patient  reports that he has quit smoking. His smoking use included Cigarettes. He has a 24 pack-year smoking history. He has never used smokeless tobacco. He reports that he drinks alcohol. He reports that he does not use illicit drugs.   ROS:  Please see the history of present illness.   Denies any syncope, bleeding, orthopnea, PND  PHYSICAL EXAM: VS:  BP 116/80 mmHg  Pulse 62  Ht 6\' 1"  (1.854 m)  Wt 199 lb (90.266 kg)  BMI 26.26 kg/m2 Well nourished, well developed, in no acute distress HEENT: normal Neck: no JVD Cardiac:  normal S1, S2; RRR; no murmur Lungs:  clear to auscultation bilaterally, no wheezing, rhonchi or rales Abd: soft, nontender, no hepatomegaly Ext: no edema Skin: warm and dry Neuro: no focal abnormalities noted       EKG:  06/08/14 -Sinus rhythm rate 80, no other abnormalities.  Labs: 11/26/13-LDL 86, ALT 27 Nuclear stress test: 2013-inferior wall infarct/ ischemia-EF 44%  ASSESSMENT AND PLAN:  1. Coronary artery disease-prior circumflex stenting. Aspirin. He has been off of clopidogrel one year post stent. 2. Angina-challenging for him to discern whether or not this is true cardiac pain or not. He thinks that it is his gallbladder at times. Occasionally he will take 2 Alka-Seltzer pills and this will help. Occasional, moderate degree chest tightness across chest wall sometimes occurring when laying down but not always. Does not appear to be exertional. He is concerned because of his generalized malaise, fatigue that this may be cardiac related. Nitroglycerin has not been used. Occasional shortness of breath. Nuclear stress test showed inferior ischemia/scar. He has not been on a beta blocker because of  underlying fatigue but is now willing to trial low-dose Toprol 25. He did not tolerate isosorbide 15 mg because of headache. He even tried to take it at night. He will now take this on as-needed basis. If he continues to have ongoing symptoms despite this therapy, we will proceed likely with cardiac catheterization. I did explain to them that if a stent were placed during cardiac catheterization this would be for symptom relief. This would not prevent future heart attacks. The best thing to prevent future heart attacks are diet,  exercise, statin medications. Livalo 2mg  which is a better tolerated statin, did not tolerate well, leg pains. He will try twice a week. 3. Fatigue-certainly could be multifactorial. He has had workup previously by Dr. Inda Merlin. 4. Old myocardial infarction-previously silent. 5. Hyperlipidemia - Did not tolerate Crestor. Previous reassuring lipid panel on Crestor with LDL less than 100. 86. Trying Livalo 2mg  twice a week. 6. Followup 3 month  Signed, Candee Furbish, MD [facility]  10/01/2014 10:08 AM

## 2014-10-09 DIAGNOSIS — J0100 Acute maxillary sinusitis, unspecified: Secondary | ICD-10-CM | POA: Diagnosis not present

## 2014-10-09 DIAGNOSIS — R05 Cough: Secondary | ICD-10-CM | POA: Diagnosis not present

## 2014-10-14 DIAGNOSIS — T783XXA Angioneurotic edema, initial encounter: Secondary | ICD-10-CM | POA: Diagnosis not present

## 2014-10-14 DIAGNOSIS — T7800XA Anaphylactic reaction due to unspecified food, initial encounter: Secondary | ICD-10-CM | POA: Diagnosis not present

## 2014-10-14 DIAGNOSIS — L500 Allergic urticaria: Secondary | ICD-10-CM | POA: Diagnosis not present

## 2014-10-23 DIAGNOSIS — J0100 Acute maxillary sinusitis, unspecified: Secondary | ICD-10-CM | POA: Diagnosis not present

## 2014-10-23 DIAGNOSIS — R05 Cough: Secondary | ICD-10-CM | POA: Diagnosis not present

## 2014-10-29 ENCOUNTER — Encounter (HOSPITAL_COMMUNITY): Payer: Self-pay | Admitting: Cardiology

## 2014-12-12 ENCOUNTER — Other Ambulatory Visit: Payer: Self-pay | Admitting: Cardiology

## 2014-12-15 DIAGNOSIS — E559 Vitamin D deficiency, unspecified: Secondary | ICD-10-CM | POA: Diagnosis not present

## 2014-12-15 DIAGNOSIS — G609 Hereditary and idiopathic neuropathy, unspecified: Secondary | ICD-10-CM | POA: Diagnosis not present

## 2014-12-15 DIAGNOSIS — R5383 Other fatigue: Secondary | ICD-10-CM | POA: Diagnosis not present

## 2014-12-15 DIAGNOSIS — G4701 Insomnia due to medical condition: Secondary | ICD-10-CM | POA: Diagnosis not present

## 2014-12-15 DIAGNOSIS — Z23 Encounter for immunization: Secondary | ICD-10-CM | POA: Diagnosis not present

## 2014-12-15 DIAGNOSIS — G4733 Obstructive sleep apnea (adult) (pediatric): Secondary | ICD-10-CM | POA: Diagnosis not present

## 2014-12-15 DIAGNOSIS — J9801 Acute bronchospasm: Secondary | ICD-10-CM | POA: Diagnosis not present

## 2014-12-15 DIAGNOSIS — I2510 Atherosclerotic heart disease of native coronary artery without angina pectoris: Secondary | ICD-10-CM | POA: Diagnosis not present

## 2014-12-15 DIAGNOSIS — Z0001 Encounter for general adult medical examination with abnormal findings: Secondary | ICD-10-CM | POA: Diagnosis not present

## 2014-12-15 DIAGNOSIS — E782 Mixed hyperlipidemia: Secondary | ICD-10-CM | POA: Diagnosis not present

## 2014-12-15 DIAGNOSIS — Z1389 Encounter for screening for other disorder: Secondary | ICD-10-CM | POA: Diagnosis not present

## 2014-12-15 DIAGNOSIS — R972 Elevated prostate specific antigen [PSA]: Secondary | ICD-10-CM | POA: Diagnosis not present

## 2014-12-29 ENCOUNTER — Emergency Department (HOSPITAL_BASED_OUTPATIENT_CLINIC_OR_DEPARTMENT_OTHER)
Admission: EM | Admit: 2014-12-29 | Discharge: 2014-12-29 | Disposition: A | Discharge status: Home / Self Care | Payer: Medicare Other | Attending: Emergency Medicine | Admitting: Emergency Medicine

## 2014-12-29 ENCOUNTER — Encounter (HOSPITAL_BASED_OUTPATIENT_CLINIC_OR_DEPARTMENT_OTHER): Payer: Self-pay | Admitting: *Deleted

## 2014-12-29 ENCOUNTER — Emergency Department (HOSPITAL_BASED_OUTPATIENT_CLINIC_OR_DEPARTMENT_OTHER): Payer: Medicare Other

## 2014-12-29 DIAGNOSIS — N281 Cyst of kidney, acquired: Secondary | ICD-10-CM | POA: Diagnosis not present

## 2014-12-29 DIAGNOSIS — G8929 Other chronic pain: Secondary | ICD-10-CM | POA: Insufficient documentation

## 2014-12-29 DIAGNOSIS — Z85828 Personal history of other malignant neoplasm of skin: Secondary | ICD-10-CM | POA: Insufficient documentation

## 2014-12-29 DIAGNOSIS — R1011 Right upper quadrant pain: Secondary | ICD-10-CM | POA: Diagnosis present

## 2014-12-29 DIAGNOSIS — Z87438 Personal history of other diseases of male genital organs: Secondary | ICD-10-CM | POA: Diagnosis not present

## 2014-12-29 DIAGNOSIS — I2510 Atherosclerotic heart disease of native coronary artery without angina pectoris: Secondary | ICD-10-CM | POA: Insufficient documentation

## 2014-12-29 DIAGNOSIS — Z9861 Coronary angioplasty status: Secondary | ICD-10-CM | POA: Diagnosis not present

## 2014-12-29 DIAGNOSIS — Z862 Personal history of diseases of the blood and blood-forming organs and certain disorders involving the immune mechanism: Secondary | ICD-10-CM | POA: Insufficient documentation

## 2014-12-29 DIAGNOSIS — Z79899 Other long term (current) drug therapy: Secondary | ICD-10-CM | POA: Diagnosis not present

## 2014-12-29 DIAGNOSIS — R110 Nausea: Secondary | ICD-10-CM | POA: Insufficient documentation

## 2014-12-29 DIAGNOSIS — M19049 Primary osteoarthritis, unspecified hand: Secondary | ICD-10-CM | POA: Insufficient documentation

## 2014-12-29 DIAGNOSIS — I252 Old myocardial infarction: Secondary | ICD-10-CM | POA: Diagnosis not present

## 2014-12-29 DIAGNOSIS — E785 Hyperlipidemia, unspecified: Secondary | ICD-10-CM | POA: Diagnosis not present

## 2014-12-29 DIAGNOSIS — Z9981 Dependence on supplemental oxygen: Secondary | ICD-10-CM | POA: Diagnosis not present

## 2014-12-29 DIAGNOSIS — G4733 Obstructive sleep apnea (adult) (pediatric): Secondary | ICD-10-CM | POA: Insufficient documentation

## 2014-12-29 DIAGNOSIS — Z7982 Long term (current) use of aspirin: Secondary | ICD-10-CM | POA: Insufficient documentation

## 2014-12-29 DIAGNOSIS — K219 Gastro-esophageal reflux disease without esophagitis: Secondary | ICD-10-CM | POA: Diagnosis not present

## 2014-12-29 DIAGNOSIS — Z87891 Personal history of nicotine dependence: Secondary | ICD-10-CM | POA: Insufficient documentation

## 2014-12-29 DIAGNOSIS — R109 Unspecified abdominal pain: Secondary | ICD-10-CM

## 2014-12-29 LAB — CBC WITH DIFFERENTIAL/PLATELET
BASOS ABS: 0.1 10*3/uL (ref 0.0–0.1)
Basophils Relative: 1 % (ref 0–1)
EOS ABS: 0.3 10*3/uL (ref 0.0–0.7)
EOS PCT: 4 % (ref 0–5)
HCT: 41 % (ref 39.0–52.0)
Hemoglobin: 14.2 g/dL (ref 13.0–17.0)
LYMPHS ABS: 2 10*3/uL (ref 0.7–4.0)
LYMPHS PCT: 28 % (ref 12–46)
MCH: 31.7 pg (ref 26.0–34.0)
MCHC: 34.6 g/dL (ref 30.0–36.0)
MCV: 91.5 fL (ref 78.0–100.0)
Monocytes Absolute: 0.6 10*3/uL (ref 0.1–1.0)
Monocytes Relative: 8 % (ref 3–12)
NEUTROS PCT: 59 % (ref 43–77)
Neutro Abs: 4.2 10*3/uL (ref 1.7–7.7)
Platelets: 198 10*3/uL (ref 150–400)
RBC: 4.48 MIL/uL (ref 4.22–5.81)
RDW: 14.3 % (ref 11.5–15.5)
WBC: 7.1 10*3/uL (ref 4.0–10.5)

## 2014-12-29 LAB — COMPREHENSIVE METABOLIC PANEL
ALT: 17 U/L (ref 0–53)
AST: 25 U/L (ref 0–37)
Albumin: 3.9 g/dL (ref 3.5–5.2)
Alkaline Phosphatase: 51 U/L (ref 39–117)
Anion gap: 4 — ABNORMAL LOW (ref 5–15)
BUN: 17 mg/dL (ref 6–23)
CALCIUM: 8.9 mg/dL (ref 8.4–10.5)
CO2: 25 mmol/L (ref 19–32)
Chloride: 109 mmol/L (ref 96–112)
Creatinine, Ser: 1.12 mg/dL (ref 0.50–1.35)
GFR calc non Af Amer: 64 mL/min — ABNORMAL LOW (ref >90)
GFR, EST AFRICAN AMERICAN: 74 mL/min — AB (ref >90)
Glucose, Bld: 128 mg/dL — ABNORMAL HIGH (ref 70–99)
Potassium: 4.1 mmol/L (ref 3.5–5.1)
SODIUM: 138 mmol/L (ref 135–145)
TOTAL PROTEIN: 6.7 g/dL (ref 6.0–8.3)
Total Bilirubin: 0.2 mg/dL — ABNORMAL LOW (ref 0.3–1.2)

## 2014-12-29 LAB — LIPASE, BLOOD: LIPASE: 30 U/L (ref 11–59)

## 2014-12-29 LAB — URINALYSIS, ROUTINE W REFLEX MICROSCOPIC
Bilirubin Urine: NEGATIVE
Glucose, UA: NEGATIVE mg/dL
Hgb urine dipstick: NEGATIVE
Ketones, ur: NEGATIVE mg/dL
NITRITE: NEGATIVE
Protein, ur: NEGATIVE mg/dL
SPECIFIC GRAVITY, URINE: 1.026 (ref 1.005–1.030)
Urobilinogen, UA: 0.2 mg/dL (ref 0.0–1.0)
pH: 5 (ref 5.0–8.0)

## 2014-12-29 LAB — URINE MICROSCOPIC-ADD ON

## 2014-12-29 LAB — TROPONIN I: Troponin I: <0.03 ng/mL (ref <0.031)

## 2014-12-29 MED ORDER — SODIUM CHLORIDE 0.9 % IV SOLN
INTRAVENOUS | Status: DC
  Administered 2014-12-29: 19:00:00 via INTRAVENOUS

## 2014-12-29 NOTE — ED Notes (Signed)
Pt right upper abd pain with nausea x 1 day

## 2014-12-29 NOTE — ED Notes (Signed)
Pt taken to US

## 2014-12-29 NOTE — ED Notes (Signed)
Pt comes in today with a c/o right upper abdominal pain. Pt states this has been ongoing for a couple of weeks. Pt states he has had nausea but no vomiting.

## 2014-12-29 NOTE — Discharge Instructions (Signed)
I recommend that you have a HIDA scan. Follow-up with your doctor for this. Return to the hospital for fever, vomiting, worsening pain Abdominal Pain Many things can cause abdominal pain. Usually, abdominal pain is not caused by a disease and will improve without treatment. It can often be observed and treated at home. Your health care provider will do a physical exam and possibly order blood tests and X-rays to help determine the seriousness of your pain. However, in many cases, more time must pass before a clear cause of the pain can be found. Before that point, your health care provider may not know if you need more testing or further treatment. HOME CARE INSTRUCTIONS  Monitor your abdominal pain for any changes. The following actions may help to alleviate any discomfort you are experiencing:  Only take over-the-counter or prescription medicines as directed by your health care provider.  Do not take laxatives unless directed to do so by your health care provider.  Try a clear liquid diet (broth, tea, or water) as directed by your health care provider. Slowly move to a bland diet as tolerated. SEEK MEDICAL CARE IF:  You have unexplained abdominal pain.  You have abdominal pain associated with nausea or diarrhea.  You have pain when you urinate or have a bowel movement.  You experience abdominal pain that wakes you in the night.  You have abdominal pain that is worsened or improved by eating food.  You have abdominal pain that is worsened with eating fatty foods.  You have a fever. SEEK IMMEDIATE MEDICAL CARE IF:   Your pain does not go away within 2 hours.  You keep throwing up (vomiting).  Your pain is felt only in portions of the abdomen, such as the right side or the left lower portion of the abdomen.  You pass bloody or black tarry stools. MAKE SURE YOU:  Understand these instructions.   Will watch your condition.   Will get help right away if you are not doing well  or get worse.  Document Released: 08/16/2005 Document Revised: 11/11/2013 Document Reviewed: 07/16/2013 [facility] Patient Information 2015 [place], [place]. This information is not intended to replace advice given to you by your health care provider. Make sure you discuss any questions you have with your health care provider.

## 2014-12-29 NOTE — ED Provider Notes (Signed)
CSN: [id number]     Arrival date & time 12/29/14  6967 History  This chart was scribed for Leota Jacobsen, MD by Evelene Croon, ED Scribe. This patient was seen in room MH05/MH05 and the patient's care was started 7:00 PM.    Chief Complaint  Patient presents with  . Abdominal Pain     The history is provided by the patient. No language interpreter was used.     HPI Comments:  Sean Hammond is a 74 y.o. male who presents to the Emergency Department complaining of moderate constant RUQ abdominal pain for one day. He has a h/o similar pain for 2-3 years states today's pain is the worst he's ever had. He states his pain isn't exacerbated by food and hasn't been in the past either. He reports associated mild nausea.  He denies SOB, diaphoresis, fever, bloody stools, diarrhea, and vomiting . He has had imaging studies of his gallbladder in the past which have been negative. He has taken an acid reducer with mild relief after his first dose but no relief after the second. He had a colonoscopy and endoscopy last year which only showed a single polyp.  Past Medical History  Diagnosis Date  . Silent myocardial infarction   . Shortness of breath on exertion   . GERD (gastroesophageal reflux disease)   . Chronic headaches     "started  09/2011"  . Skin cancer     head, ear, back  . CAD (coronary artery disease)   . Hyperlipidemia   . Leukopenia   . ED (erectile dysfunction)   . Allergic rhinitis   . Insomnia     , post  . Epigastric abdominal pain   . Epicondylitis   . Prostatitis   . Epistaxis   . Osteoarthritis of hand   . Sinusitis   . Lung nodule     , Left upper  . Moderate obstructive sleep apnea     Cpap of 9cm of H2O - 05/2012   Past Surgical History  Procedure Laterality Date  . Coronary angioplasty with stent placement  12/14/11    "2"  . Knee arthroscopy  ` 2010    left  . Bone cyst excision  ~ 2004    left knee  . Nasal sinus surgery  ~ 2002 & 2003  . Eye surgery  ~  2010    "both eyes; reshape pupil"  . Skin cancer excision      "back; head; ears"  . Left heart catheterization with coronary angiogram N/A 12/14/2011    Procedure: LEFT HEART CATHETERIZATION WITH CORONARY ANGIOGRAM;  Surgeon: Candee Furbish, MD;  Location[facility] CATH LAB;  Service: Cardiovascular;  Laterality: N/A;  . Percutaneous coronary stent intervention (pci-s)  12/14/2011    Procedure: PERCUTANEOUS CORONARY STENT INTERVENTION (PCI-S);  Surgeon: Candee Furbish, MD;  Location: [facility] CATH LAB;  Service: Cardiovascular;;   Family History  Problem Relation Age of Onset  . Heart attack Mother   . Hypertension Mother    History  Substance Use Topics  . Smoking status: Former Smoker -- 2.00 packs/day for 12 years    Types: Cigarettes  . Smokeless tobacco: Never Used     Comment: "quit smoking cigarettes 1975"  . Alcohol Use: Yes     Comment: "last beer 1969"    Review of Systems  Constitutional: Negative for fever, chills and diaphoresis.  Respiratory: Negative for shortness of breath.   Gastrointestinal: Positive for nausea and abdominal pain. Negative for vomiting,  diarrhea and blood in stool.  All other systems reviewed and are negative.     Allergies  Beef-derived products; Crestor; Percocet; and Pork-derived products  Home Medications   Prior to Admission medications   Medication Sig Start Date End Date Taking? Authorizing Provider  aspirin 81 MG tablet Take 81 mg by mouth daily.    Historical Provider, MD  Cholecalciferol (VITAMIN D HIGH POTENCY) 1000 UNITS capsule Take 1,000 Units by mouth daily.    Historical Provider, MD  diphenhydrAMINE (BENADRYL) 25 MG tablet Take 25 mg by mouth as needed.    Historical Provider, MD  EPIPEN 2-PAK 0.3 MG/0.3ML SOAJ injection as needed. 03/17/14   Historical Provider, MD  isosorbide mononitrate (IMDUR) 30 MG 24 hr tablet Take 1 tablet (30 mg total) by mouth as needed. 10/01/14   Candee Furbish, MD  metoprolol succinate (TOPROL XL) 25 MG 24 hr  tablet Take 1 tablet (25 mg total) by mouth daily. 08/31/14   Candee Furbish, MD  Multiple Vitamin (MULTIVITAMIN) capsule Take 1 capsule by mouth daily.    Historical Provider, MD  nitroGLYCERIN (NITROSTAT) 0.4 MG SL tablet Place 1 tablet (0.4 mg total) under the tongue every 5 (five) minutes as needed for chest pain. 03/04/14 02/20/16  Candee Furbish, MD  pantoprazole (PROTONIX) 40 MG tablet TAKE 1 TABLET BY MOUTH DAILY AT 12 NOON *DRUG NOT COVERED BY INSURANCE* 12/14/14   Candee Furbish, MD  Pitavastatin Calcium 2 MG TABS Take one by mouth twice a week 10/01/14   Candee Furbish, MD  traMADol (ULTRAM) 50 MG tablet Take 50 mg by mouth every 12 (twelve) hours as needed.    Historical Provider, MD   BP 139/77 mmHg  Pulse 74  Temp(Src) 97.8 F (36.6 C)  Resp 16  Ht 6\' 1"  (1.854 m)  Wt 190 lb (86.183 kg)  BMI 25.07 kg/m2  SpO2 98% Physical Exam  Constitutional: He is oriented to person, place, and time. He appears well-developed and well-nourished.  Non-toxic appearance. No distress.  HENT:  Head: Normocephalic and atraumatic.  Eyes: Conjunctivae, EOM and lids are normal. Pupils are equal, round, and reactive to light.  Neck: Normal range of motion. Neck supple. No tracheal deviation present. No thyroid mass present.  Cardiovascular: Normal rate, regular rhythm and normal heart sounds.  Exam reveals no gallop.   No murmur heard. Pulmonary/Chest: Effort normal and breath sounds normal. No stridor. No respiratory distress. He has no decreased breath sounds. He has no wheezes. He has no rhonchi. He has no rales.  Abdominal: Soft. Normal appearance and bowel sounds are normal. He exhibits no distension. There is tenderness (Mild RUQ tenderness). There is no rebound, no guarding and no CVA tenderness.  Musculoskeletal: Normal range of motion. He exhibits no edema or tenderness.  Neurological: He is alert and oriented to person, place, and time. He has normal strength. No cranial nerve deficit or sensory deficit.  GCS eye subscore is 4. GCS verbal subscore is 5. GCS motor subscore is 6.  Skin: Skin is warm and dry. No abrasion and no rash noted.  Psychiatric: He has a normal mood and affect. His speech is normal and behavior is normal.  Nursing note and vitals reviewed.   ED Course  Procedures   DIAGNOSTIC STUDIES:  Oxygen Saturation is 98% on RA, normal by my interpretation.    COORDINATION OF CARE:  7:16 PM Will order Abdominal US and EKG. Discussed treatment plan with pt at bedside and pt agreed to plan.  Labs Review  Labs Reviewed  URINALYSIS, ROUTINE W REFLEX MICROSCOPIC  CBC WITH DIFFERENTIAL/PLATELET  COMPREHENSIVE METABOLIC PANEL  LIPASE, BLOOD    Imaging Review No results found.   EKG Interpretation   Date/Time:  Tuesday December 29 2014 19:29:04 EST Ventricular Rate:  61 PR Interval:  138 QRS Duration: 90 QT Interval:  410 QTC Calculation: 412 R Axis:   64 Text Interpretation:  Normal sinus rhythm Normal ECG Confirmed by Zenia Resides   MD, Kimo Bancroft ([id number]) on 12/29/2014 7:29:12 PM      MDM   Final diagnoses:  None    I personally performed the services described in this documentation, which was scribed in my presence. The recorded information has been reviewed and is accurate.   9:32 PM Patient has no urinary symptoms at this time and therefore will not treat his urine. No evidence of gallstones but suspect that he has gallbladder dysfunction. Recommend the patient have a hydascan and will refer him to general surgery.  Leota Jacobsen, MD 12/29/14 2133

## 2015-01-07 ENCOUNTER — Ambulatory Visit (INDEPENDENT_AMBULATORY_CARE_PROVIDER_SITE_OTHER): Payer: Medicare Other | Admitting: Cardiology

## 2015-01-07 ENCOUNTER — Other Ambulatory Visit (INDEPENDENT_AMBULATORY_CARE_PROVIDER_SITE_OTHER): Payer: Self-pay

## 2015-01-07 ENCOUNTER — Encounter: Payer: Self-pay | Admitting: Cardiology

## 2015-01-07 VITALS — BP 124/82 | HR 64 | Ht 73.0 in | Wt 197.0 lb

## 2015-01-07 DIAGNOSIS — R1011 Right upper quadrant pain: Secondary | ICD-10-CM | POA: Diagnosis not present

## 2015-01-07 DIAGNOSIS — I2510 Atherosclerotic heart disease of native coronary artery without angina pectoris: Secondary | ICD-10-CM | POA: Diagnosis not present

## 2015-01-07 DIAGNOSIS — I208 Other forms of angina pectoris: Secondary | ICD-10-CM

## 2015-01-07 DIAGNOSIS — E785 Hyperlipidemia, unspecified: Secondary | ICD-10-CM | POA: Diagnosis not present

## 2015-01-07 DIAGNOSIS — I252 Old myocardial infarction: Secondary | ICD-10-CM

## 2015-01-07 NOTE — Patient Instructions (Signed)
The current medical regimen is effective;  continue present plan and medications.  Follow up in 6 months with Dr. Skains.  You will receive a letter in the mail 2 months before you are due.  Please call us when you receive this letter to schedule your follow up appointment.  Thank you for choosing [facility]!!     

## 2015-01-07 NOTE — Progress Notes (Signed)
[facility] [address], [address], [place]  [address] Phone: [phone] Fax:  [phone]  Date:  01/07/2015   ID:  SAMEER TEEPLE, DOB 1941/06/19, MRN [id number]  PCP:  Henrine Screws, MD   History of Present Illness: Sean Hammond is a 74 y.o. male with coronary artery disease status post overlapping drug eluting stents to his mid circumflex with prior ejection fraction of 44%. These were placed in January of 2013. CPAP for OSA. Prior nuclear stress test showed base to mid inferior wall ischemia in 2013. This was prior to stent placement in the circumflex artery.  Previously felt some chest pain with exertion as well as rest, sometimes when laying down, moderate in intensity resulting in a few minutes. Previously was offered beta blocker or other antianginal medications. He did not want to utilize these at prior visit. Today, he is willing to try these medications. He has stopped Crestor because of leg pains, flank pain. He thinks that his leg discomfort is getting better. He has seen Dr. Gladstone Lighter in the past who has also worked up gallbladder disease, normal.   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 Not significant. No associated arm pain or shoulder pain. Ultimately, it is very hard for him to decide whether or not he is actually having true anginal symptoms.  Could not tolerate Crestor. Hurt so bad. Livalo also had issues with. He will try twice a week dose. Unfortunately, he has not been taking this as directed.  Enjoying hunting. He is now focusing on his gallbladder issue. In February 2016 he ended up going to the emergency room. He is now seeing general surgery.  His wife is worried about him having a heart attack.  Wt Readings from Last 3 Encounters:  01/07/15 197 lb (89.359 kg)  12/29/14 190 lb (86.183 kg)    10/01/14 199 lb (90.266 kg)     Past Medical History  Diagnosis Date  . Silent myocardial infarction   . Shortness of breath on exertion   . GERD (gastroesophageal reflux disease)   . Chronic headaches     "started  09/2011"  . Skin cancer     head, ear, back  . CAD (coronary artery disease)   . Hyperlipidemia   . Leukopenia   . ED (erectile dysfunction)   . Allergic rhinitis   . Insomnia     , post  . Epigastric abdominal pain   . Epicondylitis   . Prostatitis   . Epistaxis   . Osteoarthritis of hand   . Sinusitis   . Lung nodule     , Left upper  . Moderate obstructive sleep apnea     Cpap of 9cm of H2O - 05/2012    Past Surgical History  Procedure Laterality Date  . Coronary angioplasty with stent placement  12/14/11    "2"  . Knee arthroscopy  ` 2010    left  . Bone cyst excision  ~ 2004    left knee  . Nasal sinus surgery  ~ 2002 & 2003  . Eye surgery  ~ 2010    "both eyes; reshape pupil"  . Skin cancer excision      "back; head; ears"  . Left heart catheterization with coronary angiogram N/A 12/14/2011    Procedure: LEFT HEART CATHETERIZATION  WITH CORONARY ANGIOGRAM;  Surgeon: Candee Furbish, MD;  Location: [facility] CATH LAB;  Service: Cardiovascular;  Laterality: N/A;  . Percutaneous coronary stent intervention (pci-s)  12/14/2011    Procedure: PERCUTANEOUS CORONARY STENT INTERVENTION (PCI-S);  Surgeon: Candee Furbish, MD;  Location[facility] C[facility];  Service: Cardiovascular;;    Current Outpatient Prescriptions  Medication Sig Dispense Refill  . aspirin 81 MG tablet Take 81 mg by mouth daily.    . Cholecalciferol (VITAMIN D HIGH POTENCY) 1000 UNITS capsule Take 1,000 Units by mouth daily.    . diphenhydrAMINE (BENADRYL) 25 MG tablet Take 25 mg by mouth as needed.    Marland Kitchen EPIPEN 2-PAK 0.3 MG/0.3ML SOAJ injection as needed.    . metoprolol succinate (TOPROL XL) 25 MG 24 hr tablet Take 1 tablet (25 mg total) by mouth daily. 30 tablet 11  . Multiple Vitamin (MULTIVITAMIN)  capsule Take 1 capsule by mouth daily.    . nitroGLYCERIN (NITROSTAT) 0.4 MG SL tablet Place 1 tablet (0.4 mg total) under the tongue every 5 (five) minutes as needed for chest pain. 30 tablet 6  . pantoprazole (PROTONIX) 40 MG tablet TAKE 1 TABLET BY MOUTH DAILY AT 12 NOON *DRUG NOT COVERED BY INSURANCE* 30 tablet 0  . Pitavastatin Calcium 2 MG TABS Take one by mouth twice a week 30 tablet 11  . traMADol (ULTRAM) 50 MG tablet Take 50 mg by mouth every 12 (twelve) hours as needed.     No current facility-administered medications for this visit.    Allergies:    Allergies  Allergen Reactions  . Beef-Derived Products   . Crestor [Rosuvastatin] Other (See Comments)    Muscle ache  . Percocet [Oxycodone-Acetaminophen] Itching  . Pork-Derived Products     Social History:  The patient  reports that he has quit smoking. His smoking use included Cigarettes. He has a 24 pack-year smoking history. He has never used smokeless tobacco. He reports that he drinks alcohol. He reports that he does not use illicit drugs.   ROS:  Please see the history of present illness.   Denies any syncope, bleeding, orthopnea, PND  PHYSICAL EXAM: VS:  BP 124/82 mmHg  Pulse 64  Ht 6\' 1"  (1.854 m)  Wt 197 lb (89.359 kg)  BMI 26.00 kg/m2 Well nourished, well developed, in no acute distress HEENT: normal Neck: no JVD Cardiac:  normal S1, S2; RRR; no murmur Lungs:  clear to auscultation bilaterally, no wheezing, rhonchi or rales Abd: soft, nontender, no hepatomegaly Ext: no edema Skin: warm and dry Neuro: no focal abnormalities noted       EKG:  06/08/14 -Sinus rhythm rate 80, no other abnormalities.  Labs: 11/26/13-LDL 86, ALT 27 Nuclear stress test: 2013-inferior wall infarct/ ischemia-EF 44%  ASSESSMENT AND PLAN:  1. Coronary artery disease-prior circumflex stenting. Aspirin. He has been off of clopidogrel one year post stent. 2. Angina-we have been focused on this is possibly cardiac discomfort however it  is now possibility for gallbladder disease as he was suspecting. If he needs to go forward with cholecystectomy, he may proceed with intermediate risk from a cardiovascular standpoint given his prior inferior wall infarct. Challenging for him to discern whether or not this is true cardiac pain or not. Occasionally he will take 2 Alka-Seltzer pills and this will help. Occasional, moderate degree chest tightness across chest wall sometimes occurring when laying down but not always. Does not appear to be exertional. He is concerned because of his generalized malaise, fatigue that this may be  cardiac related. Nitroglycerin has not been used. Occasional shortness of breath. Nuclear stress test showed inferior ischemia/scar. He has not been on a beta blocker because of underlying fatigue but is now willing to trial low-dose Toprol 25. He did not tolerate isosorbide 15 mg because of headache. He even tried to take it at night. He will now take this on as-needed basis.  The best thing to prevent future heart attacks are diet, exercise, statin medications. Livalo 2mg  which is a better tolerated statin, did not tolerate well, leg pains. He will try twice a week again. 3. Fatigue-certainly could be multifactorial. He has had workup previously by Dr. Inda Merlin. 4. Old myocardial infarction-previously silent. 5. Hyperlipidemia - Did not tolerate Crestor. Previous reassuring lipid panel on Crestor with LDL less than 100. 86. Trying Livalo 2mg  twice a week. 6. Followup 6 month  Signed, Candee Furbish, MD [facility]  01/07/2015 8:11 AM

## 2015-01-20 DIAGNOSIS — J209 Acute bronchitis, unspecified: Secondary | ICD-10-CM | POA: Diagnosis not present

## 2015-01-20 DIAGNOSIS — J0100 Acute maxillary sinusitis, unspecified: Secondary | ICD-10-CM | POA: Diagnosis not present

## 2015-01-21 ENCOUNTER — Ambulatory Visit (HOSPITAL_COMMUNITY): Payer: Medicare Other

## 2015-02-12 ENCOUNTER — Ambulatory Visit (HOSPITAL_COMMUNITY)
Admission: RE | Admit: 2015-02-12 | Discharge: 2015-02-12 | Disposition: A | Discharge status: Home / Self Care | Payer: Medicare Other | Source: Ambulatory Visit | Attending: General Surgery | Admitting: General Surgery

## 2015-02-12 DIAGNOSIS — R110 Nausea: Secondary | ICD-10-CM | POA: Insufficient documentation

## 2015-02-12 DIAGNOSIS — R1011 Right upper quadrant pain: Secondary | ICD-10-CM | POA: Diagnosis not present

## 2015-02-12 MED ORDER — SINCALIDE 5 MCG IJ SOLR
0.0200 ug/kg | Freq: Once | INTRAMUSCULAR | Status: AC
  Administered 2015-02-12: 1.79 ug via INTRAVENOUS

## 2015-02-12 MED ORDER — TECHNETIUM TC 99M MEBROFENIN IV KIT
5.0000 | PACK | Freq: Once | INTRAVENOUS | Status: AC | PRN
  Administered 2015-02-12: 5 via INTRAVENOUS

## 2015-02-12 MED ORDER — SINCALIDE 5 MCG IJ SOLR
INTRAMUSCULAR | Status: AC
  Filled 2015-02-12: qty 5

## 2015-02-15 ENCOUNTER — Telehealth: Payer: Self-pay | Admitting: General Surgery

## 2015-02-15 NOTE — Telephone Encounter (Signed)
Spoke with wife regarding test results. Pt out of town until Wednesday. Asked wife to have pt call office when he gets back into town.

## 2015-03-02 ENCOUNTER — Other Ambulatory Visit: Payer: Self-pay | Admitting: Cardiology

## 2015-03-02 DIAGNOSIS — L570 Actinic keratosis: Secondary | ICD-10-CM | POA: Diagnosis not present

## 2015-03-10 ENCOUNTER — Ambulatory Visit: Payer: BLUE CROSS/BLUE SHIELD | Admitting: Cardiology

## 2015-03-11 DIAGNOSIS — J0100 Acute maxillary sinusitis, unspecified: Secondary | ICD-10-CM | POA: Diagnosis not present

## 2015-03-21 ENCOUNTER — Other Ambulatory Visit: Payer: Self-pay

## 2015-03-21 ENCOUNTER — Observation Stay (HOSPITAL_COMMUNITY)
Admission: EM | Admit: 2015-03-21 | Discharge: 2015-03-25 | Disposition: A | Discharge status: Home / Self Care | Payer: Medicare Other | Attending: Internal Medicine | Admitting: Internal Medicine

## 2015-03-21 ENCOUNTER — Encounter (HOSPITAL_COMMUNITY): Payer: Self-pay

## 2015-03-21 ENCOUNTER — Emergency Department (HOSPITAL_COMMUNITY): Payer: Medicare Other

## 2015-03-21 ENCOUNTER — Other Ambulatory Visit (HOSPITAL_COMMUNITY): Payer: Self-pay

## 2015-03-21 DIAGNOSIS — K5730 Diverticulosis of large intestine without perforation or abscess without bleeding: Secondary | ICD-10-CM | POA: Diagnosis not present

## 2015-03-21 DIAGNOSIS — N320 Bladder-neck obstruction: Secondary | ICD-10-CM | POA: Insufficient documentation

## 2015-03-21 DIAGNOSIS — Z87891 Personal history of nicotine dependence: Secondary | ICD-10-CM | POA: Insufficient documentation

## 2015-03-21 DIAGNOSIS — Z7982 Long term (current) use of aspirin: Secondary | ICD-10-CM | POA: Insufficient documentation

## 2015-03-21 DIAGNOSIS — G4733 Obstructive sleep apnea (adult) (pediatric): Secondary | ICD-10-CM | POA: Diagnosis not present

## 2015-03-21 DIAGNOSIS — E785 Hyperlipidemia, unspecified: Secondary | ICD-10-CM | POA: Insufficient documentation

## 2015-03-21 DIAGNOSIS — I2510 Atherosclerotic heart disease of native coronary artery without angina pectoris: Secondary | ICD-10-CM | POA: Insufficient documentation

## 2015-03-21 DIAGNOSIS — K429 Umbilical hernia without obstruction or gangrene: Secondary | ICD-10-CM | POA: Diagnosis not present

## 2015-03-21 DIAGNOSIS — R1031 Right lower quadrant pain: Secondary | ICD-10-CM

## 2015-03-21 DIAGNOSIS — K8020 Calculus of gallbladder without cholecystitis without obstruction: Secondary | ICD-10-CM

## 2015-03-21 DIAGNOSIS — K219 Gastro-esophageal reflux disease without esophagitis: Secondary | ICD-10-CM | POA: Insufficient documentation

## 2015-03-21 DIAGNOSIS — K8080 Other cholelithiasis without obstruction: Principal | ICD-10-CM | POA: Insufficient documentation

## 2015-03-21 DIAGNOSIS — I252 Old myocardial infarction: Secondary | ICD-10-CM | POA: Insufficient documentation

## 2015-03-21 DIAGNOSIS — Z91018 Allergy to other foods: Secondary | ICD-10-CM | POA: Insufficient documentation

## 2015-03-21 DIAGNOSIS — R0602 Shortness of breath: Secondary | ICD-10-CM | POA: Diagnosis not present

## 2015-03-21 DIAGNOSIS — R55 Syncope and collapse: Secondary | ICD-10-CM | POA: Diagnosis not present

## 2015-03-21 DIAGNOSIS — R1011 Right upper quadrant pain: Secondary | ICD-10-CM | POA: Diagnosis not present

## 2015-03-21 DIAGNOSIS — Z85828 Personal history of other malignant neoplasm of skin: Secondary | ICD-10-CM | POA: Insufficient documentation

## 2015-03-21 DIAGNOSIS — R1010 Upper abdominal pain, unspecified: Secondary | ICD-10-CM | POA: Diagnosis not present

## 2015-03-21 DIAGNOSIS — Z955 Presence of coronary angioplasty implant and graft: Secondary | ICD-10-CM | POA: Diagnosis not present

## 2015-03-21 DIAGNOSIS — R079 Chest pain, unspecified: Secondary | ICD-10-CM | POA: Diagnosis not present

## 2015-03-21 LAB — CBC
HCT: 48.3 % (ref 39.0–52.0)
Hemoglobin: 16.8 g/dL (ref 13.0–17.0)
MCH: 32 pg (ref 26.0–34.0)
MCHC: 34.8 g/dL (ref 30.0–36.0)
MCV: 92 fL (ref 78.0–100.0)
PLATELETS: 165 10*3/uL (ref 150–400)
RBC: 5.25 MIL/uL (ref 4.22–5.81)
RDW: 13.4 % (ref 11.5–15.5)
WBC: 8.3 10*3/uL (ref 4.0–10.5)

## 2015-03-21 LAB — PROTIME-INR
INR: 1.06 (ref 0.00–1.49)
Prothrombin Time: 14 seconds (ref 11.6–15.2)

## 2015-03-21 LAB — URINALYSIS, ROUTINE W REFLEX MICROSCOPIC
Bilirubin Urine: NEGATIVE
GLUCOSE, UA: NEGATIVE mg/dL
Hgb urine dipstick: NEGATIVE
Ketones, ur: 15 mg/dL — AB
LEUKOCYTES UA: NEGATIVE
Nitrite: NEGATIVE
PH: 5.5 (ref 5.0–8.0)
Protein, ur: NEGATIVE mg/dL
Specific Gravity, Urine: 1.009 (ref 1.005–1.030)
Urobilinogen, UA: 0.2 mg/dL (ref 0.0–1.0)

## 2015-03-21 LAB — TROPONIN I: Troponin I: <0.03 ng/mL (ref <0.031)

## 2015-03-21 LAB — I-STAT TROPONIN, ED: TROPONIN I, POC: 0 ng/mL (ref 0.00–0.08)

## 2015-03-21 LAB — HEPATIC FUNCTION PANEL
ALK PHOS: 51 U/L (ref 38–126)
ALT: 24 U/L (ref 17–63)
AST: 28 U/L (ref 15–41)
Albumin: 3.9 g/dL (ref 3.5–5.0)
BILIRUBIN TOTAL: 0.8 mg/dL (ref 0.3–1.2)
Bilirubin, Direct: 0.1 mg/dL (ref 0.1–0.5)
Indirect Bilirubin: 0.7 mg/dL (ref 0.3–0.9)
TOTAL PROTEIN: 6.9 g/dL (ref 6.5–8.1)

## 2015-03-21 LAB — BASIC METABOLIC PANEL
Anion gap: 7 (ref 5–15)
BUN: 16 mg/dL (ref 6–20)
CALCIUM: 8.8 mg/dL — AB (ref 8.9–10.3)
CO2: 21 mmol/L — ABNORMAL LOW (ref 22–32)
Chloride: 106 mmol/L (ref 101–111)
Creatinine, Ser: 1.19 mg/dL (ref 0.61–1.24)
GFR calc Af Amer: >60 mL/min (ref >60)
GFR calc non Af Amer: 60 mL/min — ABNORMAL LOW (ref >60)
Glucose, Bld: 93 mg/dL (ref 70–99)
POTASSIUM: 4 mmol/L (ref 3.5–5.1)
SODIUM: 134 mmol/L — AB (ref 135–145)

## 2015-03-21 LAB — LIPASE, BLOOD: Lipase: 17 U/L — ABNORMAL LOW (ref 22–51)

## 2015-03-21 LAB — TSH: TSH: 1.44 u[IU]/mL (ref 0.350–4.500)

## 2015-03-21 MED ORDER — ASPIRIN EC 81 MG PO TBEC
81.0000 mg | DELAYED_RELEASE_TABLET | Freq: Every day | ORAL | Status: DC
  Administered 2015-03-22 – 2015-03-24 (×2): 81 mg via ORAL
  Filled 2015-03-21 (×3): qty 1

## 2015-03-21 MED ORDER — ASPIRIN 81 MG PO CHEW
324.0000 mg | CHEWABLE_TABLET | Freq: Once | ORAL | Status: AC
  Administered 2015-03-21: 324 mg via ORAL
  Filled 2015-03-21: qty 4

## 2015-03-21 MED ORDER — MORPHINE SULFATE 2 MG/ML IJ SOLN
1.0000 mg | INTRAMUSCULAR | Status: DC | PRN
Start: 2015-03-21 — End: 2015-03-25
  Filled 2015-03-21: qty 1

## 2015-03-21 MED ORDER — HEPARIN SODIUM (PORCINE) 5000 UNIT/ML IJ SOLN
5000.0000 [IU] | Freq: Three times a day (TID) | INTRAMUSCULAR | Status: DC
  Filled 2015-03-21: qty 1

## 2015-03-21 MED ORDER — SODIUM CHLORIDE 0.9 % IV BOLUS (SEPSIS)
1000.0000 mL | Freq: Once | INTRAVENOUS | Status: AC
  Administered 2015-03-21: 1000 mL via INTRAVENOUS

## 2015-03-21 MED ORDER — METOPROLOL SUCCINATE ER 25 MG PO TB24
25.0000 mg | ORAL_TABLET | Freq: Every day | ORAL | Status: DC
  Administered 2015-03-22 – 2015-03-25 (×4): 25 mg via ORAL
  Filled 2015-03-21 (×5): qty 1

## 2015-03-21 MED ORDER — ACETAMINOPHEN 325 MG PO TABS: 650.0000 mg | ORAL_TABLET | Freq: Four times a day (QID) | ORAL | Status: DC | PRN

## 2015-03-21 MED ORDER — ENSURE ENLIVE PO LIQD
237.0000 mL | Freq: Two times a day (BID) | ORAL | Status: DC
  Administered 2015-03-24: 237 mL via ORAL

## 2015-03-21 MED ORDER — SODIUM CHLORIDE 0.9 % IV SOLN: INTRAVENOUS | Status: DC

## 2015-03-21 MED ORDER — ONDANSETRON HCL 4 MG/2ML IJ SOLN
4.0000 mg | Freq: Four times a day (QID) | INTRAMUSCULAR | Status: DC | PRN
  Administered 2015-03-22 – 2015-03-23 (×5): 4 mg via INTRAVENOUS
  Filled 2015-03-21 (×5): qty 2

## 2015-03-21 MED ORDER — SODIUM CHLORIDE 0.9 % IJ SOLN
3.0000 mL | Freq: Two times a day (BID) | INTRAMUSCULAR | Status: DC
  Administered 2015-03-22 – 2015-03-25 (×4): 3 mL via INTRAVENOUS

## 2015-03-21 MED ORDER — ONDANSETRON HCL 4 MG PO TABS: 4.0000 mg | ORAL_TABLET | Freq: Four times a day (QID) | ORAL | Status: DC | PRN

## 2015-03-21 MED ORDER — PANTOPRAZOLE SODIUM 40 MG PO TBEC
40.0000 mg | DELAYED_RELEASE_TABLET | Freq: Every day | ORAL | Status: DC
  Administered 2015-03-22 – 2015-03-24 (×2): 40 mg via ORAL
  Filled 2015-03-21 (×3): qty 1

## 2015-03-21 MED ORDER — HYDROCODONE-ACETAMINOPHEN 5-325 MG PO TABS
1.0000 | ORAL_TABLET | ORAL | Status: DC | PRN
Start: 2015-03-21 — End: 2015-03-23
  Administered 2015-03-22 (×3): 2 via ORAL
  Filled 2015-03-21 (×3): qty 2

## 2015-03-21 MED ORDER — ACETAMINOPHEN 650 MG RE SUPP: 650.0000 mg | Freq: Four times a day (QID) | RECTAL | Status: DC | PRN

## 2015-03-21 NOTE — ED Notes (Signed)
Pt going to be admitted.  Family at bedside.  NAD at this time.

## 2015-03-21 NOTE — H&P (Signed)
[facility] History and Physical  Sean Hammond OMB:[id number] DOB: 10-Apr-1941 DOA: 03/21/2015  Referring physician: EDP PCP: Henrine Screws, MD   Chief Complaint: Chest pain and presyncope  HPI: Sean Hammond is a 74 y.o. male with past medial history of CAD (silent myocardial infarction) and stent to the circumflex placed in 2013, patient came to the hospital with presyncope. Patient said for the past 2 days did have occasional epigastric to RUQ abdominal pain, he also felt nauseous with it, earlier today he went to church he was very sweaty, and felt he will pass out. Patient brought to the hospital for further evaluation. His symptoms were very challenging, he had the symptoms going on off for the past 6 months, was seen by general surgery and recommended cholecystectomy by the end of May. In the ED first set of cardiac enzymes is negative, EKG showed normal sinus rhythm with no evidence of ischemia, abdominal ultrasound showed no cholelithiasis, negative Murphy's sign.  Review of Systems:  Constitutional: negative for anorexia, fevers and sweats Eyes: negative for irritation, redness and visual disturbance Ears, nose, mouth, throat, and face: negative for earaches, epistaxis, nasal congestion and sore throat Respiratory: negative for cough, dyspnea on exertion, sputum and wheezing Cardiovascular: Complaining about exertional dyspnea Gastrointestinal: negative for abdominal pain, constipation, diarrhea, melena, nausea and vomiting Genitourinary:negative for dysuria, frequency and hematuria Hematologic/lymphatic: negative for bleeding, easy bruising and lymphadenopathy Musculoskeletal:negative for arthralgias, muscle weakness and stiff joints Neurological: negative for coordination problems, gait problems, headaches and weakness Endocrine: negative for diabetic symptoms including polydipsia, polyuria and weight loss Allergic/Immunologic: negative for anaphylaxis, hay fever and  urticaria  Past Medical History  Diagnosis Date  . Silent myocardial infarction   . Shortness of breath on exertion   . GERD (gastroesophageal reflux disease)   . Chronic headaches     "started  09/2011"  . Skin cancer     head, ear, back  . CAD (coronary artery disease)   . Hyperlipidemia   . Leukopenia   . ED (erectile dysfunction)   . Allergic rhinitis   . Insomnia     , post  . Epigastric abdominal pain   . Epicondylitis   . Prostatitis   . Epistaxis   . Osteoarthritis of hand   . Sinusitis   . Lung nodule     , Left upper  . Moderate obstructive sleep apnea     Cpap of 9cm of H2O - 05/2012   Past Surgical History  Procedure Laterality Date  . Coronary angioplasty with stent placement  12/14/11    "2"  . Knee arthroscopy  ` 2010    left  . Bone cyst excision  ~ 2004    left knee  . Nasal sinus surgery  ~ 2002 & 2003  . Eye surgery  ~ 2010    "both eyes; reshape pupil"  . Skin cancer excision      "back; head; ears"  . Left heart catheterization with coronary angiogram N/A 12/14/2011    Procedure: LEFT HEART CATHETERIZATION WITH CORONARY ANGIOGRAM;  Surgeon: Candee Furbish, MD;  Location: [facility] CATH LAB;  Service: Cardiovascular;  Laterality: N/A;  . Percutaneous coronary stent intervention (pci-s)  12/14/2011    Procedure: PERCUTANEOUS CORONARY STENT INTERVENTION (PCI-S);  Surgeon: Candee Furbish, MD;  Location: [facility] CATH LAB;  Service: Cardiovascular;;   Social History:   reports that he has quit smoking. His smoking use included Cigarettes. He has a 24 pack-year smoking history. He has never used smokeless  tobacco. He reports that he drinks alcohol. He reports that he does not use illicit drugs.  Allergies  Allergen Reactions  . Beef-Derived Products   . Crestor [Rosuvastatin] Other (See Comments)    Muscle ache  . Percocet [Oxycodone-Acetaminophen] Itching  . Pitavastatin Other (See Comments)    Leg pain  . Pork-Derived Products     Family History  Problem  Relation Age of Onset  . Heart attack Mother   . Hypertension Mother      Prior to Admission medications   Medication Sig Start Date End Date Taking? Authorizing Provider  aspirin 81 MG tablet Take 81 mg by mouth daily.   Yes Historical Provider, MD  Cholecalciferol (VITAMIN D HIGH POTENCY) 1000 UNITS capsule Take 1,000 Units by mouth daily.   Yes Historical Provider, MD  diphenhydrAMINE (BENADRYL) 25 MG tablet Take 25 mg by mouth as needed.   Yes Historical Provider, MD  EPIPEN 2-PAK 0.3 MG/0.3ML SOAJ injection as needed. 03/17/14  Yes Historical Provider, MD  metoprolol succinate (TOPROL XL) 25 MG 24 hr tablet Take 1 tablet (25 mg total) by mouth daily. 08/31/14  Yes Jerline Pain, MD  Multiple Vitamin (MULTIVITAMIN) capsule Take 1 capsule by mouth daily.   Yes Historical Provider, MD  NITROSTAT 0.4 MG SL tablet PLACE 1 TABLET BY MOUTH UNDER THE TOUNGE EVERY 5 MINUTES AS NEEDED FOR CHEST PAIN 03/02/15  Yes Jerline Pain, MD  pantoprazole (PROTONIX) 40 MG tablet TAKE 1 TABLET BY MOUTH DAILY AT 12 NOON *DRUG NOT COVERED BY INSURANCE* 12/14/14  Yes Jerline Pain, MD  traMADol (ULTRAM) 50 MG tablet Take 50 mg by mouth daily.    Yes Historical Provider, MD  Pitavastatin Calcium 2 MG TABS Take one by mouth twice a week Patient not taking: Reported on 03/21/2015 10/01/14   Jerline Pain, MD   Physical Exam: Filed Vitals:   03/21/15 1317  BP: 126/69  Pulse: 72  Temp: 97.9 F (36.6 C)  Resp: 20   Constitutional: Oriented to person, place, and time. Well-developed and well-nourished. Cooperative.  Head: Normocephalic and atraumatic.  Nose: Nose normal.  Mouth/Throat: Uvula is midline, oropharynx is clear and moist and mucous membranes are normal.  Eyes: Conjunctivae and EOM are normal. Pupils are equal, round, and reactive to light.  Neck: Trachea normal and normal range of motion. Neck supple.  Cardiovascular: Normal rate, regular rhythm, S1 normal, S2 normal, normal heart sounds and intact  distal pulses.   Pulmonary/Chest: Effort normal and breath sounds normal.  Abdominal: Soft. Bowel sounds are normal. There is no hepatosplenomegaly. There is no tenderness.  Musculoskeletal: Normal range of motion.  Neurological: Alert and oriented to person, place, and time. Has normal strength. No cranial nerve deficit or sensory deficit.  Skin: Skin is warm, dry and intact.  Psychiatric: Has a normal mood and affect. Speech is normal and behavior is normal.   Labs on Admission:  Basic Metabolic Panel:  Recent Labs Lab 03/21/15 1101  NA 134*  K 4.0  CL 106  CO2 21*  GLUCOSE 93  BUN 16  CREATININE 1.19  CALCIUM 8.8*   Liver Function Tests:  Recent Labs Lab 03/21/15 1101  AST 28  ALT 24  ALKPHOS 51  BILITOT 0.8  PROT 6.9  ALBUMIN 3.9    Recent Labs Lab 03/21/15 1101  LIPASE 17*   No results for input(s): AMMONIA in the last 168 hours. CBC:  Recent Labs Lab 03/21/15 1101  WBC 8.3  HGB 16.8  HCT 48.3  MCV 92.0  PLT 165   Cardiac Enzymes: No results for input(s): CKTOTAL, CKMB, CKMBINDEX, TROPONINI in the last 168 hours.  BNP (last 3 results) No results for input(s): BNP in the last 8760 hours.  ProBNP (last 3 results) No results for input(s): PROBNP in the last 8760 hours.  CBG: No results for input(s): GLUCAP in the last 168 hours.  Radiological Exams on Admission: Dg Chest 2 View  03/21/2015   CLINICAL DATA:  Right-sided chest pain radiating to the upper abdomen. Scheduled cholecystectomy this month. Feels like gallbladder pain. Diaphoretic. History of smoking.  EXAM: CHEST  2 VIEW  COMPARISON:  01/08/2013  FINDINGS: Lungs are mildly hyperinflated. No focal consolidations or pleural effusions. No pulmonary edema. Mild mid thoracic spondylosis.  IMPRESSION: No evidence for acute abnormality. Mild hyperinflation. Abnormality.   Electronically Signed   By: Nolon Nations M.D.   On: 03/21/2015 12:07   US Abdomen Limited  03/21/2015   CLINICAL DATA:   Three-day history of upper abdominal pain  EXAM: US ABDOMEN LIMITED - RIGHT UPPER QUADRANT  COMPARISON:  December 29, 2014  FINDINGS: Gallbladder:  No gallstones or wall thickening visualized. There is no pericholecystic fluid. No sonographic Murphy sign noted.  Common bile duct:  Diameter: 4 mm. There is no intrahepatic or extrahepatic biliary duct dilatation.  Liver:  No focal lesion identified. Within normal limits in parenchymal echogenicity.  IMPRESSION: Study within normal limits.   Electronically Signed   By: Lowella Grip III M.D.   On: 03/21/2015 13:31    EKG: Independently reviewed. Sinus rhythm, no ischemic changes  Assessment/Plan Principal Problem:   Near syncope Active Problems:   Shortness of breath on exertion   CAD in native artery   RUQ abdominal pain  Notes from Dr. Kingsley Plan office reviewed   Presyncope Presented with lightheadedness, diaphoresis and presyncope. Rule out ACS, obtained 3 sets of cardiac enzymes and repeat EKG in the morning. 2-D echocardiogram, patient will be on telemetry to rule out arrhythmias.  RUQ abdominal pain Pain is going on for some time (about 6 months), being worked up for gallbladder problems. 2 ultrasounds and on a HIDA scan were negative for issues. Cannot rule out this to be as anginal equivalent. Anyway patient will have cholecystectomy by the end of May.  CAD History of stent in the circumflex artery in 2013, patient started complaining about exertional dyspnea/pain. Cardiology consulted for further evaluation. I think he needs to see a cardiologist before surgery anyway.   Code Status: Full code Family Communication: Plan discussed with the patient in presence of his daughter at bedside Disposition Plan: None  Time spent: 70 minutes  [facility] A, MD [facility] Pager [phone]

## 2015-03-21 NOTE — ED Provider Notes (Signed)
CSN: [id number]     Arrival date & time 03/21/15  1045 History   First MD Initiated Contact with Patient 03/21/15 1050     Chief Complaint  Patient presents with  . Chest Pain     (Consider location/radiation/quality/duration/timing/severity/associated sxs/prior Treatment) HPI  74 year old male presents after acutely becoming diaphoretic, pale, and lightheaded while in church. The patient was sitting in church when his wife noticed he was very pale and sweating. The patient states he has been having worse and more frequent epigastric/chest pain over the past 3 days. He's been dealing with abdominal pain for quite some time and is scheduled to have his gallbladder taken out on 5/26. The patient associates the pain is been having this weekend with his gallbladder type pain. He does not think he had an acute onset of this type of pain when he became diaphoretic and lightheaded. The patient also has a history of a silent MI, denies ever having anginal symptoms at all. Currently he has minimal to no pain in his abdomen and no chest pain. No shortness of breath.  Past Medical History  Diagnosis Date  . Silent myocardial infarction   . Shortness of breath on exertion   . GERD (gastroesophageal reflux disease)   . Chronic headaches     "started  09/2011"  . Skin cancer     head, ear, back  . CAD (coronary artery disease)   . Hyperlipidemia   . Leukopenia   . ED (erectile dysfunction)   . Allergic rhinitis   . Insomnia     , post  . Epigastric abdominal pain   . Epicondylitis   . Prostatitis   . Epistaxis   . Osteoarthritis of hand   . Sinusitis   . Lung nodule     , Left upper  . Moderate obstructive sleep apnea     Cpap of 9cm of H2O - 05/2012   Past Surgical History  Procedure Laterality Date  . Coronary angioplasty with stent placement  12/14/11    "2"  . Knee arthroscopy  ` 2010    left  . Bone cyst excision  ~ 2004    left knee  . Nasal sinus surgery  ~ 2002 & 2003  . Eye  surgery  ~ 2010    "both eyes; reshape pupil"  . Skin cancer excision      "back; head; ears"  . Left heart catheterization with coronary angiogram N/A 12/14/2011    Procedure: LEFT HEART CATHETERIZATION WITH CORONARY ANGIOGRAM;  Surgeon: Candee Furbish, MD;  Location: [facility] CATH LAB;  Service: Cardiovascular;  Laterality: N/A;  . Percutaneous coronary stent intervention (pci-s)  12/14/2011    Procedure: PERCUTANEOUS CORONARY STENT INTERVENTION (PCI-S);  Surgeon: Candee Furbish, MD;  Location: [facility] CATH LAB;  Service: Cardiovascular;;   Family History  Problem Relation Age of Onset  . Heart attack Mother   . Hypertension Mother    History  Substance Use Topics  . Smoking status: Former Smoker -- 2.00 packs/day for 12 years    Types: Cigarettes  . Smokeless tobacco: Never Used     Comment: "quit smoking cigarettes 1975"  . Alcohol Use: Yes     Comment: "last beer 1969"    Review of Systems  Constitutional: Positive for diaphoresis. Negative for fever.  Cardiovascular: Positive for chest pain.  Gastrointestinal: Positive for abdominal pain. Negative for vomiting.  Skin: Positive for pallor.  Neurological: Positive for light-headedness. Negative for syncope.  All other systems reviewed and are  negative.     Allergies  Beef-derived products; Crestor; Percocet; and Pork-derived products  Home Medications   Prior to Admission medications   Medication Sig Start Date End Date Taking? Authorizing Provider  aspirin 81 MG tablet Take 81 mg by mouth daily.    Historical Provider, MD  Cholecalciferol (VITAMIN D HIGH POTENCY) 1000 UNITS capsule Take 1,000 Units by mouth daily.    Historical Provider, MD  diphenhydrAMINE (BENADRYL) 25 MG tablet Take 25 mg by mouth as needed.    Historical Provider, MD  EPIPEN 2-PAK 0.3 MG/0.3ML SOAJ injection as needed. 03/17/14   Historical Provider, MD  metoprolol succinate (TOPROL XL) 25 MG 24 hr tablet Take 1 tablet (25 mg total) by mouth daily. 08/31/14   Jerline Pain, MD  Multiple Vitamin (MULTIVITAMIN) capsule Take 1 capsule by mouth daily.    Historical Provider, MD  NITROSTAT 0.4 MG SL tablet PLACE 1 TABLET BY MOUTH UNDER THE TOUNGE EVERY 5 MINUTES AS NEEDED FOR CHEST PAIN 03/02/15   Jerline Pain, MD  pantoprazole (PROTONIX) 40 MG tablet TAKE 1 TABLET BY MOUTH DAILY AT 12 NOON *DRUG NOT COVERED BY INSURANCE* 12/14/14   Jerline Pain, MD  Pitavastatin Calcium 2 MG TABS Take one by mouth twice a week 10/01/14   Jerline Pain, MD  traMADol (ULTRAM) 50 MG tablet Take 50 mg by mouth every 12 (twelve) hours as needed.    Historical Provider, MD   BP 133/70 mmHg  Pulse 77  Temp(Src) 98.1 F (36.7 C) (Oral)  Resp 20  SpO2 97% Physical Exam  Constitutional: He is oriented to person, place, and time. He appears well-developed and well-nourished. No distress.  HENT:  Head: Normocephalic and atraumatic.  Right Ear: External ear normal.  Left Ear: External ear normal.  Nose: Nose normal.  Eyes: Right eye exhibits no discharge. Left eye exhibits no discharge.  Neck: Neck supple.  Cardiovascular: Normal rate, regular rhythm, normal heart sounds and intact distal pulses.   Pulmonary/Chest: Effort normal and breath sounds normal.  Abdominal: Soft. He exhibits no distension. There is no tenderness. There is no rigidity, no guarding and negative Murphy's sign.  Musculoskeletal: He exhibits no edema.  Neurological: He is alert and oriented to person, place, and time.  Skin: Skin is warm and dry. He is not diaphoretic.  Nursing note and vitals reviewed.   ED Course  Procedures (including critical care time) Labs Review Labs Reviewed  BASIC METABOLIC PANEL - Abnormal; Notable for the following:    Sodium 134 (*)    CO2 21 (*)    Calcium 8.8 (*)    GFR calc non Af Amer 60 (*)    All other components within normal limits  LIPASE, BLOOD - Abnormal; Notable for the following:    Lipase 17 (*)    All other components within normal limits  URINALYSIS,  ROUTINE W REFLEX MICROSCOPIC - Abnormal; Notable for the following:    Ketones, ur 15 (*)    All other components within normal limits  CBC  HEPATIC FUNCTION PANEL  I-STAT TROPOININ, ED    Imaging Review Dg Chest 2 View  03/21/2015   CLINICAL DATA:  Right-sided chest pain radiating to the upper abdomen. Scheduled cholecystectomy this month. Feels like gallbladder pain. Diaphoretic. History of smoking.  EXAM: CHEST  2 VIEW  COMPARISON:  01/08/2013  FINDINGS: Lungs are mildly hyperinflated. No focal consolidations or pleural effusions. No pulmonary edema. Mild mid thoracic spondylosis.  IMPRESSION: No evidence for  acute abnormality. Mild hyperinflation. Abnormality.   Electronically Signed   By: Nolon Nations M.D.   On: 03/21/2015 12:07   US Abdomen Limited  03/21/2015   CLINICAL DATA:  Three-day history of upper abdominal pain  EXAM: US ABDOMEN LIMITED - RIGHT UPPER QUADRANT  COMPARISON:  December 29, 2014  FINDINGS: Gallbladder:  No gallstones or wall thickening visualized. There is no pericholecystic fluid. No sonographic Murphy sign noted.  Common bile duct:  Diameter: 4 mm. There is no intrahepatic or extrahepatic biliary duct dilatation.  Liver:  No focal lesion identified. Within normal limits in parenchymal echogenicity.  IMPRESSION: Study within normal limits.   Electronically Signed   By: Lowella Grip III M.D.   On: 03/21/2015 13:31     EKG Interpretation   Date/Time:  Sunday Mar 21 2015 10:56:47 EDT Ventricular Rate:  75 PR Interval:  129 QRS Duration: 100 QT Interval:  364 QTC Calculation: 406 R Axis:   55 Text Interpretation:  Normal sinus rhythm Baseline wander in lead(s) III  V3 Normal ECG no significant change since Feb 2016 Confirmed by Regenia Skeeter   MD, [place] ([id number]) on 03/21/2015 10:59:30 AM      MDM   Final diagnoses:  Near syncope    Patient currently feels well, no acute pain. Patient did have near syncope without any obvious cause. He was sitting down and all  of a sudden felt the symptoms. There is no significant pain associated with this to explain the near syncope. Given his ultrasound appears complete normal for his gallbladder I have concerned that his recurrent epigastric/abdominal pain is actually cardiac instead of gallbladder. Will admit for ACS rule out as well as syncope workup.    Sherwood Gambler, MD 03/21/15 [phone]

## 2015-03-21 NOTE — ED Notes (Signed)
He c/o right sided chest/thoracic pain radiating into upper abd./central chest area.  He further tells [place] he has cholelithiasis and has impending date for cholecystectomy this month; and that this discomfort feels like gallbladder pain.  He is here d/t sudden episode of pallor with marked diaphoresis ("I got soaking wet") while sitting in church this morning.  He is alert and oriented x 4 with clear speech.

## 2015-03-22 ENCOUNTER — Observation Stay (HOSPITAL_COMMUNITY): Payer: Medicare Other

## 2015-03-22 ENCOUNTER — Other Ambulatory Visit: Payer: Self-pay

## 2015-03-22 ENCOUNTER — Encounter (HOSPITAL_COMMUNITY): Payer: Self-pay | Admitting: Cardiology

## 2015-03-22 DIAGNOSIS — K5730 Diverticulosis of large intestine without perforation or abscess without bleeding: Secondary | ICD-10-CM | POA: Diagnosis not present

## 2015-03-22 DIAGNOSIS — R1011 Right upper quadrant pain: Secondary | ICD-10-CM | POA: Diagnosis not present

## 2015-03-22 DIAGNOSIS — R55 Syncope and collapse: Secondary | ICD-10-CM | POA: Diagnosis not present

## 2015-03-22 DIAGNOSIS — Z01810 Encounter for preprocedural cardiovascular examination: Secondary | ICD-10-CM | POA: Diagnosis not present

## 2015-03-22 DIAGNOSIS — K429 Umbilical hernia without obstruction or gangrene: Secondary | ICD-10-CM | POA: Diagnosis not present

## 2015-03-22 DIAGNOSIS — I2510 Atherosclerotic heart disease of native coronary artery without angina pectoris: Secondary | ICD-10-CM | POA: Diagnosis not present

## 2015-03-22 DIAGNOSIS — R0602 Shortness of breath: Secondary | ICD-10-CM | POA: Diagnosis not present

## 2015-03-22 DIAGNOSIS — K811 Chronic cholecystitis: Secondary | ICD-10-CM | POA: Diagnosis not present

## 2015-03-22 DIAGNOSIS — N320 Bladder-neck obstruction: Secondary | ICD-10-CM | POA: Diagnosis not present

## 2015-03-22 DIAGNOSIS — K8080 Other cholelithiasis without obstruction: Secondary | ICD-10-CM | POA: Diagnosis not present

## 2015-03-22 LAB — LIPID PANEL
Cholesterol: 165 mg/dL (ref 0–200)
HDL: 45 mg/dL (ref >40)
LDL Cholesterol: 105 mg/dL — ABNORMAL HIGH (ref 0–99)
TRIGLYCERIDES: 73 mg/dL (ref <150)
Total CHOL/HDL Ratio: 3.7 RATIO
VLDL: 15 mg/dL (ref 0–40)

## 2015-03-22 LAB — BASIC METABOLIC PANEL
Anion gap: 12 (ref 5–15)
BUN: 17 mg/dL (ref 6–20)
CALCIUM: 9 mg/dL (ref 8.9–10.3)
CHLORIDE: 104 mmol/L (ref 101–111)
CO2: 22 mmol/L (ref 22–32)
CREATININE: 1.05 mg/dL (ref 0.61–1.24)
GFR calc non Af Amer: >60 mL/min (ref >60)
Glucose, Bld: 73 mg/dL (ref 70–99)
Potassium: 4.2 mmol/L (ref 3.5–5.1)
Sodium: 138 mmol/L (ref 135–145)

## 2015-03-22 LAB — CBC
HEMATOCRIT: 45.3 % (ref 39.0–52.0)
Hemoglobin: 15.4 g/dL (ref 13.0–17.0)
MCH: 31 pg (ref 26.0–34.0)
MCHC: 34 g/dL (ref 30.0–36.0)
MCV: 91.3 fL (ref 78.0–100.0)
PLATELETS: 153 10*3/uL (ref 150–400)
RBC: 4.96 MIL/uL (ref 4.22–5.81)
RDW: 13.3 % (ref 11.5–15.5)
WBC: 6.3 10*3/uL (ref 4.0–10.5)

## 2015-03-22 LAB — TROPONIN I

## 2015-03-22 MED ORDER — IOHEXOL 300 MG/ML  SOLN
25.0000 mL | INTRAMUSCULAR | Status: AC
Start: 2015-03-22 — End: 2015-03-22
  Administered 2015-03-22: 25 mL via ORAL
  Administered 2015-03-22: 50 mL via ORAL

## 2015-03-22 MED ORDER — ENOXAPARIN SODIUM 40 MG/0.4ML ~~LOC~~ SOLN: 40.0000 mg | SUBCUTANEOUS | Status: DC

## 2015-03-22 MED ORDER — IOHEXOL 300 MG/ML  SOLN
100.0000 mL | Freq: Once | INTRAMUSCULAR | Status: AC | PRN
  Administered 2015-03-22: 100 mL via INTRAVENOUS

## 2015-03-22 MED ORDER — DEXTROSE-NACL 5-0.9 % IV SOLN
INTRAVENOUS | Status: DC
  Administered 2015-03-22 – 2015-03-23 (×2): via INTRAVENOUS

## 2015-03-22 NOTE — Consult Note (Signed)
Reason for Consult:  Abdominal pain/presyncope Referring Physician: Dr. Blenda Hammond is an 74 y.o. male.  HPI: 74 y/o came to ED from church yesterday after experiencing diaphoresis/palor/light headed in church.  He presented to ED with complaints of chest pain.  He said he had been having pain over the last 3 days with nausea since after lunch on Friday 4/29.  He was not able to eat anymore on Friday, Saturday, or Sunday. He reports some coffee, and coke over the next 2 days, with some ginger ale on Sunday before church.  He  described pains as mostly coming and going,in the RUQ, lasting a few minutes then resolving.  Chest pain more on the right side, and nausea =that will not resolve.  No vomiting.   He has been dealing with abdominal pain for some time and saw Dr. Redmond Hammond on 01/07/15 with what sounded like biliary colic.  This had been occuring for 5-7 years before that visit.  He described pain under his ribs on the right, going to his back, nausea and severe nausea. Pain would go to back between shoulder blades.  Reflux medications did not help. Episodes lasted only about 1 hour or so.  Work up included a HIDA scan which was in-fact normal.  Despite this pain was in RUQ and going to back and Dr. Redmond Hammond recommended cholecystectomy because in his opinion this still sounded like Biliary Colic.  He recently received cardiac clearance and was scheduled for surgery later this month by Dr. Redmond Hammond. Work up in the ED, show he is afebrile, BP up some today, labs are unremarkable so far. UA was normal.  CXR OK, limited RUQ US shows: No gallstones or wall thickening visualized. There is no pericholecystic fluid. No sonographic Murphy sign noted.  Common bile duct:  Diameter: 4 mm. There is no intrahepatic or extrahepatic biliary duct dilatation.  [place] 12/29/14:  Gallbladder: Incompletely distended. No stones or wall thickening. No pericholecystic fluid.  Common bile duct: Normal in caliber, 2.15m diameter.   Liver: Homogeneous in echotexture without focal lesion or intrahepatic bile duct dilatation. CT of the abdomen and pelvis are pending and we are ask to see.   Past Medical History  Diagnosis Date  Silent myocardial infarction, with DES stents to mid Circumflex EF 44% Off Plavix 1 year January 2013   Moderate obstructive sleep apnea    Cpap of 9cm of H2O - 05/2012     Shortness of breath on exertion  GERD (gastroesophageal reflux disease)     Chronic headaches   "started  09/2011"     Hyperlipidemia   Osteoarthritis of hand   Epicondylitis  Prostatitis     Body mass index is 24.81 Insomnia   Lung nodule      Epigastric abdominal pain   Skin cancer    head, ear, back  Leukopenia   ED (erectile dysfunction)   Allergic rhinitis   Hx of tobacco use 20 years, up to 2 PPD, quit about age 74     Past Surgical History  Procedure Laterality Date  . Coronary angioplasty with stent placement  12/14/11    "2"  . Knee arthroscopy  ` 2010    left  . Bone cyst excision  ~ 2004    left knee  . Nasal sinus surgery  ~ 2002 & 2003  . Eye surgery  ~ 2010    "both eyes; reshape pupil"  . Skin cancer excision      "back; head;  ears"  . Left heart catheterization with coronary angiogram N/A 12/14/2011    Procedure: LEFT HEART CATHETERIZATION WITH CORONARY ANGIOGRAM;  Surgeon: Candee Furbish, MD;  Location: [facility] CATH LAB;  Service: Cardiovascular;  Laterality: N/A;  . Percutaneous coronary stent intervention (pci-s)  12/14/2011    Procedure: PERCUTANEOUS CORONARY STENT INTERVENTION (PCI-S);  Surgeon: Candee Furbish, MD;  Location: [facility] CATH LAB;  Service: Cardiovascular;;    Family History  Problem Relation Age of Onset  . Heart attack Mother   . Hypertension Mother     Social History:  reports that he has quit smoking. His smoking use included Cigarettes. He has a 24 pack-year smoking history. He has never used smokeless tobacco. He reports that he drinks alcohol. He reports that he does not  use illicit drugs. Tobacco: 1-2 PPD age [age] ETOH:  Social none since age 18 Drugs:  None Married Pastor/retired Higher education careers adviser  Allergies:  Allergies  Allergen Reactions  . Beef-Derived Products   . Crestor [Rosuvastatin] Other (See Comments)    Muscle ache  . Percocet [Oxycodone-Acetaminophen] Itching  . Pitavastatin Other (See Comments)    Leg pain  . Pork-Derived Products     Medications:  Prior to Admission:  Prescriptions prior to admission  Medication Sig Dispense Refill Last Dose  . aspirin 81 MG tablet Take 81 mg by mouth daily.   03/20/2015 at Unknown time  . Cholecalciferol (VITAMIN D HIGH POTENCY) 1000 UNITS capsule Take 1,000 Units by mouth daily.   Past Week at Unknown time  . diphenhydrAMINE (BENADRYL) 25 MG tablet Take 25 mg by mouth as needed.   Past Month at Unknown time  . EPIPEN 2-PAK 0.3 MG/0.3ML SOAJ injection as needed.   Taking  . metoprolol succinate (TOPROL XL) 25 MG 24 hr tablet Take 1 tablet (25 mg total) by mouth daily. 30 tablet 11 03/20/2015 at 1900  . Multiple Vitamin (MULTIVITAMIN) capsule Take 1 capsule by mouth daily.   03/20/2015 at Unknown time  . NITROSTAT 0.4 MG SL tablet PLACE 1 TABLET BY MOUTH UNDER THE TOUNGE EVERY 5 MINUTES AS NEEDED FOR CHEST PAIN 25 tablet 6   . pantoprazole (PROTONIX) 40 MG tablet TAKE 1 TABLET BY MOUTH DAILY AT 12 NOON *DRUG NOT COVERED BY INSURANCE* 30 tablet 0 03/20/2015 at Unknown time  . traMADol (ULTRAM) 50 MG tablet Take 50 mg by mouth daily.    03/20/2015 at Unknown time  . Pitavastatin Calcium 2 MG TABS Take one by mouth twice a week (Patient not taking: Reported on 03/21/2015) 30 tablet 11 Not Taking at Unknown time   Scheduled: . aspirin EC  81 mg Oral Daily  . feeding supplement (ENSURE ENLIVE)  237 mL Oral BID BM  . iohexol  25 mL Oral Q1 Hr x 2  . metoprolol succinate  25 mg Oral Daily  . pantoprazole  40 mg Oral Daily  . sodium chloride  3 mL Intravenous Q12H   Continuous: . sodium chloride      EXH:BZJIRCVELFYBO **OR** acetaminophen, HYDROcodone-acetaminophen, morphine injection, ondansetron **OR** ondansetron (ZOFRAN) IV Anti-infectives    None      Results for orders placed or performed during the hospital encounter of 03/21/15 (from the past 48 hour(s))  CBC     Status: None   Collection Time: 03/21/15 11:01 AM  Result Value Ref Range   WBC 8.3 4.0 - 10.5 K/uL   RBC 5.25 4.22 - 5.81 MIL/uL   Hemoglobin 16.8 13.0 - 17.0 g/dL   HCT 48.3 39.0 -  52.0 %   MCV 92.0 78.0 - 100.0 fL   MCH 32.0 26.0 - 34.0 pg   MCHC 34.8 30.0 - 36.0 g/dL   RDW 13.4 11.5 - 15.5 %   Platelets 165 150 - 400 K/uL  Basic metabolic panel     Status: Abnormal   Collection Time: 03/21/15 11:01 AM  Result Value Ref Range   Sodium 134 (L) 135 - 145 mmol/L   Potassium 4.0 3.5 - 5.1 mmol/L   Chloride 106 101 - 111 mmol/L   CO2 21 (L) 22 - 32 mmol/L   Glucose, Bld 93 70 - 99 mg/dL   BUN 16 6 - 20 mg/dL   Creatinine, Ser 1.19 0.61 - 1.24 mg/dL   Calcium 8.8 (L) 8.9 - 10.3 mg/dL   GFR calc non Af Amer 60 (L) >60 mL/min   GFR calc Af Amer >60 >60 mL/min    Comment: (NOTE) The eGFR has been calculated using the CKD EPI equation. This calculation has not been validated in all clinical situations. eGFR's persistently <90 mL/min signify possible Chronic Kidney Disease.    Anion gap 7 5 - 15  Lipase, blood     Status: Abnormal   Collection Time: 03/21/15 11:01 AM  Result Value Ref Range   Lipase 17 (L) 22 - 51 U/L  Hepatic function panel     Status: None   Collection Time: 03/21/15 11:01 AM  Result Value Ref Range   Total Protein 6.9 6.5 - 8.1 g/dL   Albumin 3.9 3.5 - 5.0 g/dL   AST 28 15 - 41 U/L   ALT 24 17 - 63 U/L   Alkaline Phosphatase 51 38 - 126 U/L   Total Bilirubin 0.8 0.3 - 1.2 mg/dL   Bilirubin, Direct 0.1 0.1 - 0.5 mg/dL   Indirect Bilirubin 0.7 0.3 - 0.9 mg/dL  I-stat troponin, ED  (not at [facility], [facility])     Status: None   Collection Time: 03/21/15 11:04 AM  Result Value Ref Range    Troponin i, poc 0.00 0.00 - 0.08 ng/mL   Comment 3            Comment: Due to the release kinetics of cTnI, a negative result within the first hours of the onset of symptoms does not rule out myocardial infarction with certainty. If myocardial infarction is still suspected, repeat the test at appropriate intervals.   Urinalysis, Routine w reflex microscopic     Status: Abnormal   Collection Time: 03/21/15 12:13 PM  Result Value Ref Range   Color, Urine YELLOW YELLOW   APPearance CLEAR CLEAR   Specific Gravity, Urine 1.009 1.005 - 1.030   pH 5.5 5.0 - 8.0   Glucose, UA NEGATIVE NEGATIVE mg/dL   Hgb urine dipstick NEGATIVE NEGATIVE   Bilirubin Urine NEGATIVE NEGATIVE   Ketones, ur 15 (A) NEGATIVE mg/dL   Protein, ur NEGATIVE NEGATIVE mg/dL   Urobilinogen, UA 0.2 0.0 - 1.0 mg/dL   Nitrite NEGATIVE NEGATIVE   Leukocytes, UA NEGATIVE NEGATIVE    Comment: MICROSCOPIC NOT DONE ON URINES WITH NEGATIVE PROTEIN, BLOOD, LEUKOCYTES, NITRITE, OR GLUCOSE <1000 mg/dL.  Protime-INR     Status: None   Collection Time: 03/21/15  5:16 PM  Result Value Ref Range   Prothrombin Time 14.0 11.6 - 15.2 seconds   INR 1.06 0.00 - 1.49  TSH     Status: None   Collection Time: 03/21/15  5:16 PM  Result Value Ref Range   TSH 1.440 0.350 -  4.500 uIU/mL  Troponin I     Status: None   Collection Time: 03/21/15  5:16 PM  Result Value Ref Range   Troponin I <0.03 <0.031 ng/mL    Comment:        NO INDICATION OF MYOCARDIAL INJURY.   Troponin I     Status: None   Collection Time: 03/21/15 11:00 PM  Result Value Ref Range   Troponin I <0.03 <0.031 ng/mL    Comment:        NO INDICATION OF MYOCARDIAL INJURY.   Troponin I     Status: None   Collection Time: 03/22/15  5:03 AM  Result Value Ref Range   Troponin I <0.03 <0.031 ng/mL    Comment:        NO INDICATION OF MYOCARDIAL INJURY.   Basic metabolic panel     Status: None   Collection Time: 03/22/15  5:03 AM  Result Value Ref Range   Sodium  138 135 - 145 mmol/L   Potassium 4.2 3.5 - 5.1 mmol/L   Chloride 104 101 - 111 mmol/L   CO2 22 22 - 32 mmol/L   Glucose, Bld 73 70 - 99 mg/dL   BUN 17 6 - 20 mg/dL   Creatinine, Ser 1.05 0.61 - 1.24 mg/dL   Calcium 9.0 8.9 - 10.3 mg/dL   GFR calc non Af Amer >60 >60 mL/min   GFR calc Af Amer >60 >60 mL/min    Comment: (NOTE) The eGFR has been calculated using the CKD EPI equation. This calculation has not been validated in all clinical situations. eGFR's persistently <90 mL/min signify possible Chronic Kidney Disease.    Anion gap 12 5 - 15  CBC     Status: None   Collection Time: 03/22/15  5:03 AM  Result Value Ref Range   WBC 6.3 4.0 - 10.5 K/uL   RBC 4.96 4.22 - 5.81 MIL/uL   Hemoglobin 15.4 13.0 - 17.0 g/dL   HCT 45.3 39.0 - 52.0 %   MCV 91.3 78.0 - 100.0 fL   MCH 31.0 26.0 - 34.0 pg   MCHC 34.0 30.0 - 36.0 g/dL   RDW 13.3 11.5 - 15.5 %   Platelets 153 150 - 400 K/uL  Lipid panel     Status: Abnormal   Collection Time: 03/22/15  5:03 AM  Result Value Ref Range   Cholesterol 165 0 - 200 mg/dL   Triglycerides 73 <150 mg/dL   HDL 45 >40 mg/dL   Total CHOL/HDL Ratio 3.7 RATIO   VLDL 15 0 - 40 mg/dL   LDL Cholesterol 105 (H) 0 - 99 mg/dL    Comment:        Total Cholesterol/HDL:CHD Risk Coronary Heart Disease Risk Table                     Men   Women  1/2 Average Risk   3.4   3.3  Average Risk       5.0   4.4  2 X Average Risk   9.6   7.1  3 X Average Risk  23.4   11.0        Use the calculated Patient Ratio above and the CHD Risk Table to determine the patient's CHD Risk.        ATP III CLASSIFICATION (LDL):  <100     mg/dL   Optimal  100-129  mg/dL   Near or Above  Optimal  130-159  mg/dL   Borderline  160-189  mg/dL   High  >190     mg/dL   Very High Performed at [facility]     Dg Chest 2 View  03/21/2015   CLINICAL DATA:  Right-sided chest pain radiating to the upper abdomen. Scheduled cholecystectomy this month. Feels like  gallbladder pain. Diaphoretic. History of smoking.  EXAM: CHEST  2 VIEW  COMPARISON:  01/08/2013  FINDINGS: Lungs are mildly hyperinflated. No focal consolidations or pleural effusions. No pulmonary edema. Mild mid thoracic spondylosis.  IMPRESSION: No evidence for acute abnormality. Mild hyperinflation. Abnormality.   Electronically Signed   By: Nolon Nations M.D.   On: 03/21/2015 12:07   US Abdomen Limited  03/21/2015   CLINICAL DATA:  Three-day history of upper abdominal pain  EXAM: US ABDOMEN LIMITED - RIGHT UPPER QUADRANT  COMPARISON:  December 29, 2014  FINDINGS: Gallbladder:  No gallstones or wall thickening visualized. There is no pericholecystic fluid. No sonographic Murphy sign noted.  Common bile duct:  Diameter: 4 mm. There is no intrahepatic or extrahepatic biliary duct dilatation.  Liver:  No focal lesion identified. Within normal limits in parenchymal echogenicity.  IMPRESSION: Study within normal limits.   Electronically Signed   By: Lowella Grip III M.D.   On: 03/21/2015 13:31    Review of Systems  Constitutional: Positive for weight loss (20 lbs since this started). Negative for fever, chills, malaise/fatigue and diaphoresis.  Eyes: Negative.   Respiratory: Negative.  Negative for cough, hemoptysis, sputum production, shortness of breath and wheezing.   Cardiovascular: Negative.   Gastrointestinal: Positive for heartburn (on PPI), nausea (nausea is almost chronic since friday ), vomiting, abdominal pain (pain is fleeting last a few minutes) and diarrhea (loose watery stools over weekend). Negative for constipation and blood in stool.  Genitourinary: Negative.   Musculoskeletal: Positive for joint pain and neck pain (chronic with some disc disease and spurs). Back pain: need knees done.  Skin: Negative.   Neurological: Negative.  Negative for weakness.  Endo/Heme/Allergies: Negative.   Psychiatric/Behavioral: Negative.    Blood pressure 129/95, pulse 80, temperature 98.4 F  (36.9 C), temperature source Oral, resp. rate 20, height 6' 1"  (1.854 m), weight 85.276 kg (188 lb), SpO2 96 %. Physical Exam  Constitutional: He is oriented to person, place, and time. He appears well-developed and well-nourished. No distress.  HENT:  Head: Normocephalic and atraumatic.  Nose: Nose normal.  Eyes: Conjunctivae and EOM are normal. Right eye exhibits no discharge. Left eye exhibits no discharge.  Neck: Normal range of motion. Neck supple. No JVD present. No tracheal deviation present. No thyromegaly present.  Cardiovascular: Normal rate, regular rhythm, normal heart sounds and intact distal pulses.   No murmur heard. Respiratory: Effort normal and breath sounds normal. No respiratory distress. He has no wheezes. He has no rales. He exhibits no tenderness.  GI: Soft. Bowel sounds are normal. He exhibits no distension and no mass. There is tenderness (some tenderness RUQ to palpation, pain goes to his side and back when he is having it.). There is no rebound and no guarding.  Musculoskeletal: He exhibits no edema or tenderness.  Lymphadenopathy:    He has no cervical adenopathy.  Neurological: He is alert and oriented to person, place, and time. No cranial nerve deficit.  Skin: Skin is warm and dry. No rash noted. He is not diaphoretic. No erythema. No pallor.  Psychiatric: He has a normal mood and affect. His behavior  is normal. Judgment and thought content normal.    Assessment/Plan: Biliary colic like symptoms, ongoing. Weight loss CAD with prior MI and DES stents/cardiomyopathy/ off Plavix; troponin's are negative/last EF 44% GERD on PPI Remote tobacco use Dyslipidemia Meat allergy Sleep apnea, does not use CPAP  Plan:  He has been scheduled for elective cholecystectomy for biliary colic symptoms.  Will discuss going forward now.  Dr. Redmond Hammond could not do until 5/4, at the earliest.  He has cardiac clearance.  Echo is pending.  Will discuss with Dr.  Emogene Morgan.  Visente Kirker 03/22/2015, 12:10 PM

## 2015-03-22 NOTE — Progress Notes (Signed)
Echocardiogram 2D Echocardiogram has been performed.  Tresa Res 03/22/2015, 1:20 PM

## 2015-03-22 NOTE — Progress Notes (Signed)
Report received from Roderick Pee, RN. No change from initial pm assessment. Will continue to monitor and follow the POC.

## 2015-03-22 NOTE — Progress Notes (Signed)
Initial Nutrition Assessment  DOCUMENTATION CODES:  Not applicable  INTERVENTION: - Continue Heart Healthy diet - RD to continue to monitor for needs  NUTRITION DIAGNOSIS:  Unintentional weight loss related to chronic illness as evidenced by per patient/family report, percent weight loss.  GOAL:  Patient will meet greater than or equal to 90% of their needs  MONITOR:  PO intake, Weight trends, Labs, I & O's  REASON FOR ASSESSMENT:  Malnutrition Screening Tool    ASSESSMENT: Pt seen for MST: 4. BMI indicates normal weight status. Pt reports abdominal pain has intermittently bothering him for the past 5-7 years. He states that the recent flare has been for a few months and during that time he lost weight from his UBW of 185 lbs, although he sometimes reaches 190 lbs. From UBW, this indicates 2% weight loss. Noted that 3 months ago pt was at 190 lbs which would indicate 5% loss in that time frame.  Pt reports fair appetite now related to abdominal pain. He states plan for surgery tomorrow for cholecystectomy; this was originally scheduled for 5/26 PTA. Pt states daughter is going to get him some soup later today because he is currently waiting for a test and cannot eat.  Unable to assess if pt is meeting needs. Labs and medications reviewed.  Height:  Ht Readings from Last 1 Encounters:  03/22/15 6\' 1"  (1.854 m)    Weight:  Wt Readings from Last 1 Encounters:  03/22/15 181 lb 7 oz (82.3 kg)    Ideal Body Weight:  78.18 kg (kg)  Wt Readings from Last 10 Encounters:  03/22/15 181 lb 7 oz (82.3 kg)  01/07/15 197 lb (89.359 kg)  12/29/14 190 lb (86.183 kg)  10/01/14 199 lb (90.266 kg)  08/31/14 203 lb (92.08 kg)  06/16/14 199 lb (90.266 kg)  06/08/14 201 lb (91.173 kg)  12/02/13 200 lb 6.4 oz (90.901 kg)    BMI:  Body mass index is 23.94 kg/(m^2).  Estimated Nutritional Needs:  Kcal:  1600-1800  Protein:  80-100 grams  Fluid:  2L/day  Skin:  Reviewed, no  issues  Diet Order:  Diet Heart Room service appropriate?: Yes; Fluid consistency:: Thin Diet NPO time specified  EDUCATION NEEDS:  No education needs identified at this time   Intake/Output Summary (Last 24 hours) at 03/22/15 1457 Last data filed at 03/22/15 1333  Gross per 24 hour  Intake    240 ml  Output      0 ml  Net    240 ml    Last BM:  PTA    Jarome Matin, RD, LDN Inpatient Clinical Dietitian Pager # [phone] After hours/weekend pager # [phone]

## 2015-03-22 NOTE — Consult Note (Signed)
nuc study results: 05/2015 The study is suggestive of some inferior ischemia. This is very minor by quantitative analysis. Visually it appears somewhat more marked. I believe there is some inferior ischemia. This would be considered a low risk scan because of the small number of segments.  LV Ejection Fraction: 52%. LV Wall Motion: Normal Wall Motion

## 2015-03-22 NOTE — Consult Note (Signed)
Reason for Consult: near syncope and surgical clearance    Referring Physician: Dr. Hartford Poli  PCP:  Henrine Screws, MD  Primary Cardiologist:Dr. Charline Bills is an 74 y.o. male.    Chief Complaint: epigastric to RUQ pain.  + nausea, bacame seaty and felt he would pass out.      HPI: 74 y.o. male with past medial history of coronary artery disease (previous silent MI) status post overlapping drug eluting stents to his mid circumflex with prior ejection fraction of 44%. These were placed in January of 2013. Off Plavix for 1 year now post stenting.  CPAP for OSA. Prior nuclear stress test showed base to mid inferior wall ischemia in 2013. This was prior to stent placement in the circumflex artery.  In 05/2014 had nuclear stress test and per Dr. [name] note showed inferior ischemia/scar.    Patient came to the hospital with presyncope. Patient said for the past 3 days did have occasional epigastric to RUQ abdominal pain, started Friday after eating tenderloin biscuit and hash browns  and he also felt nauseous with it, earlier yesterday  he went to church he was very sweaty, and felt he would pass out. Patient brought to the hospital for further evaluation. His symptoms were very challenging, he had the symptoms going on off for the past 6 months, was seen by general surgery and recommended cholecystectomy by the end of May.  Begins mid upper abd and radiates around to rt and up across shoulder.  Sometimes into bot shoulders.  This occurs with chole attacks but becoming more intense.    In the ED first set of cardiac enzymes was negative,  And all other negative.  EKG showed normal sinus rhythm with no evidence of ischemia, abdominal ultrasound showed no cholelithiasis, negative Murphy's sign.     1. Last visit with Dr. [name] 01/07/15 was noted  has not been on a beta blocker because of underlying fatigue but is now willing to trial low-dose Toprol 25. He did not tolerate  isosorbide 15 mg because of headache. He even tried to take it at night. He will now take this on as-needed basis. On that visit "If he needs to go forward with cholecystectomy, he may proceed with intermediate risk from a cardiovascular standpoint given his prior inferior wall infarct"  Past Medical History  Diagnosis Date  . Silent myocardial infarction   . Shortness of breath on exertion   . GERD (gastroesophageal reflux disease)   . Chronic headaches     "started  09/2011"  . Skin cancer     head, ear, back  . CAD (coronary artery disease)   . Hyperlipidemia   . Leukopenia   . ED (erectile dysfunction)   . Allergic rhinitis   . Insomnia     , post  . Epigastric abdominal pain   . Epicondylitis   . Prostatitis   . Epistaxis   . Osteoarthritis of hand   . Sinusitis   . Lung nodule     , Left upper  . Moderate obstructive sleep apnea     Cpap of 9cm of H2O - 05/2012    Past Surgical History  Procedure Laterality Date  . Coronary angioplasty with stent placement  12/14/11    "2"  . Knee arthroscopy  ` 2010    left  . Bone cyst excision  ~ 2004    left knee  . Nasal sinus surgery  ~  2002 & 2003  . Eye surgery  ~ 2010    "both eyes; reshape pupil"  . Skin cancer excision      "back; head; ears"  . Left heart catheterization with coronary angiogram N/A 12/14/2011    Procedure: LEFT HEART CATHETERIZATION WITH CORONARY ANGIOGRAM;  Surgeon: Candee Furbish, MD;  Location: [facility] CATH LAB;  Service: Cardiovascular;  Laterality: N/A;  . Percutaneous coronary stent intervention (pci-s)  12/14/2011    Procedure: PERCUTANEOUS CORONARY STENT INTERVENTION (PCI-S);  Surgeon: Candee Furbish, MD;  Location: [facility] CATH LAB;  Service: Cardiovascular;;    Family History  Problem Relation Age of Onset  . Heart attack Mother   . Hypertension Mother    Social History:  reports that he has quit smoking. His smoking use included Cigarettes. He has a 24 pack-year smoking history. He has never used  smokeless tobacco. He reports that he drinks alcohol. He reports that he does not use illicit drugs.  Allergies:  Allergies  Allergen Reactions  . Beef-Derived Products   . Crestor [Rosuvastatin] Other (See Comments)    Muscle ache  . Percocet [Oxycodone-Acetaminophen] Itching  . Pitavastatin Other (See Comments)    Leg pain  . Pork-Derived Products     @medshecduled @ @medinfusions @ acetaminophen **OR** acetaminophen, HYDROcodone-acetaminophen, morphine injection, ondansetron **OR** ondansetron (ZOFRAN) IV  Results for orders placed or performed during the hospital encounter of 03/21/15 (from the past 48 hour(s))  CBC     Status: None   Collection Time: 03/21/15 11:01 AM  Result Value Ref Range   WBC 8.3 4.0 - 10.5 K/uL   RBC 5.25 4.22 - 5.81 MIL/uL   Hemoglobin 16.8 13.0 - 17.0 g/dL   HCT 48.3 39.0 - 52.0 %   MCV 92.0 78.0 - 100.0 fL   MCH 32.0 26.0 - 34.0 pg   MCHC 34.8 30.0 - 36.0 g/dL   RDW 13.4 11.5 - 15.5 %   Platelets 165 150 - 400 K/uL  Basic metabolic panel     Status: Abnormal   Collection Time: 03/21/15 11:01 AM  Result Value Ref Range   Sodium 134 (L) 135 - 145 mmol/L   Potassium 4.0 3.5 - 5.1 mmol/L   Chloride 106 101 - 111 mmol/L   CO2 21 (L) 22 - 32 mmol/L   Glucose, Bld 93 70 - 99 mg/dL   BUN 16 6 - 20 mg/dL   Creatinine, Ser 1.19 0.61 - 1.24 mg/dL   Calcium 8.8 (L) 8.9 - 10.3 mg/dL   GFR calc non Af Amer 60 (L) >60 mL/min   GFR calc Af Amer >60 >60 mL/min    Comment: (NOTE) The eGFR has been calculated using the CKD EPI equation. This calculation has not been validated in all clinical situations. eGFR's persistently <90 mL/min signify possible Chronic Kidney Disease.    Anion gap 7 5 - 15  Lipase, blood     Status: Abnormal   Collection Time: 03/21/15 11:01 AM  Result Value Ref Range   Lipase 17 (L) 22 - 51 U/L  Hepatic function panel     Status: None   Collection Time: 03/21/15 11:01 AM  Result Value Ref Range   Total Protein 6.9 6.5 - 8.1  g/dL   Albumin 3.9 3.5 - 5.0 g/dL   AST 28 15 - 41 U/L   ALT 24 17 - 63 U/L   Alkaline Phosphatase 51 38 - 126 U/L   Total Bilirubin 0.8 0.3 - 1.2 mg/dL   Bilirubin, Direct 0.1 0.1 -  0.5 mg/dL   Indirect Bilirubin 0.7 0.3 - 0.9 mg/dL  I-stat troponin, ED  (not at [facility], [facility])     Status: None   Collection Time: 03/21/15 11:04 AM  Result Value Ref Range   Troponin i, poc 0.00 0.00 - 0.08 ng/mL   Comment 3            Comment: Due to the release kinetics of cTnI, a negative result within the first hours of the onset of symptoms does not rule out myocardial infarction with certainty. If myocardial infarction is still suspected, repeat the test at appropriate intervals.   Urinalysis, Routine w reflex microscopic     Status: Abnormal   Collection Time: 03/21/15 12:13 PM  Result Value Ref Range   Color, Urine YELLOW YELLOW   APPearance CLEAR CLEAR   Specific Gravity, Urine 1.009 1.005 - 1.030   pH 5.5 5.0 - 8.0   Glucose, UA NEGATIVE NEGATIVE mg/dL   Hgb urine dipstick NEGATIVE NEGATIVE   Bilirubin Urine NEGATIVE NEGATIVE   Ketones, ur 15 (A) NEGATIVE mg/dL   Protein, ur NEGATIVE NEGATIVE mg/dL   Urobilinogen, UA 0.2 0.0 - 1.0 mg/dL   Nitrite NEGATIVE NEGATIVE   Leukocytes, UA NEGATIVE NEGATIVE    Comment: MICROSCOPIC NOT DONE ON URINES WITH NEGATIVE PROTEIN, BLOOD, LEUKOCYTES, NITRITE, OR GLUCOSE <1000 mg/dL.  Protime-INR     Status: None   Collection Time: 03/21/15  5:16 PM  Result Value Ref Range   Prothrombin Time 14.0 11.6 - 15.2 seconds   INR 1.06 0.00 - 1.49  TSH     Status: None   Collection Time: 03/21/15  5:16 PM  Result Value Ref Range   TSH 1.440 0.350 - 4.500 uIU/mL  Troponin I     Status: None   Collection Time: 03/21/15  5:16 PM  Result Value Ref Range   Troponin I <0.03 <0.031 ng/mL    Comment:        NO INDICATION OF MYOCARDIAL INJURY.   Troponin I     Status: None   Collection Time: 03/21/15 11:00 PM  Result Value Ref Range   Troponin I <0.03 <0.031  ng/mL    Comment:        NO INDICATION OF MYOCARDIAL INJURY.   Troponin I     Status: None   Collection Time: 03/22/15  5:03 AM  Result Value Ref Range   Troponin I <0.03 <0.031 ng/mL    Comment:        NO INDICATION OF MYOCARDIAL INJURY.   Basic metabolic panel     Status: None   Collection Time: 03/22/15  5:03 AM  Result Value Ref Range   Sodium 138 135 - 145 mmol/L   Potassium 4.2 3.5 - 5.1 mmol/L   Chloride 104 101 - 111 mmol/L   CO2 22 22 - 32 mmol/L   Glucose, Bld 73 70 - 99 mg/dL   BUN 17 6 - 20 mg/dL   Creatinine, Ser 1.05 0.61 - 1.24 mg/dL   Calcium 9.0 8.9 - 10.3 mg/dL   GFR calc non Af Amer >60 >60 mL/min   GFR calc Af Amer >60 >60 mL/min    Comment: (NOTE) The eGFR has been calculated using the CKD EPI equation. This calculation has not been validated in all clinical situations. eGFR's persistently <90 mL/min signify possible Chronic Kidney Disease.    Anion gap 12 5 - 15  CBC     Status: None   Collection Time: 03/22/15  5:03 AM  Result Value Ref Range   WBC 6.3 4.0 - 10.5 K/uL   RBC 4.96 4.22 - 5.81 MIL/uL   Hemoglobin 15.4 13.0 - 17.0 g/dL   HCT 45.3 39.0 - 52.0 %   MCV 91.3 78.0 - 100.0 fL   MCH 31.0 26.0 - 34.0 pg   MCHC 34.0 30.0 - 36.0 g/dL   RDW 13.3 11.5 - 15.5 %   Platelets 153 150 - 400 K/uL  Lipid panel     Status: Abnormal   Collection Time: 03/22/15  5:03 AM  Result Value Ref Range   Cholesterol 165 0 - 200 mg/dL   Triglycerides 73 <150 mg/dL   HDL 45 >40 mg/dL   Total CHOL/HDL Ratio 3.7 RATIO   VLDL 15 0 - 40 mg/dL   LDL Cholesterol 105 (H) 0 - 99 mg/dL    Comment:        Total Cholesterol/HDL:CHD Risk Coronary Heart Disease Risk Table                     Men   Women  1/2 Average Risk   3.4   3.3  Average Risk       5.0   4.4  2 X Average Risk   9.6   7.1  3 X Average Risk  23.4   11.0        Use the calculated Patient Ratio above and the CHD Risk Table to determine the patient's CHD Risk.        ATP III CLASSIFICATION  (LDL):  <100     mg/dL   Optimal  100-129  mg/dL   Near or Above                    Optimal  130-159  mg/dL   Borderline  160-189  mg/dL   High  >190     mg/dL   Very High Performed at [facility]    Dg Chest 2 View  03/21/2015   CLINICAL DATA:  Right-sided chest pain radiating to the upper abdomen. Scheduled cholecystectomy this month. Feels like gallbladder pain. Diaphoretic. History of smoking.  EXAM: CHEST  2 VIEW  COMPARISON:  01/08/2013  FINDINGS: Lungs are mildly hyperinflated. No focal consolidations or pleural effusions. No pulmonary edema. Mild mid thoracic spondylosis.  IMPRESSION: No evidence for acute abnormality. Mild hyperinflation. Abnormality.   Electronically Signed   By: Nolon Nations M.D.   On: 03/21/2015 12:07   US Abdomen Limited  03/21/2015   CLINICAL DATA:  Three-day history of upper abdominal pain  EXAM: US ABDOMEN LIMITED - RIGHT UPPER QUADRANT  COMPARISON:  December 29, 2014  FINDINGS: Gallbladder:  No gallstones or wall thickening visualized. There is no pericholecystic fluid. No sonographic Murphy sign noted.  Common bile duct:  Diameter: 4 mm. There is no intrahepatic or extrahepatic biliary duct dilatation.  Liver:  No focal lesion identified. Within normal limits in parenchymal echogenicity.  IMPRESSION: Study within normal limits.   Electronically Signed   By: Lowella Grip III M.D.   On: 03/21/2015 13:31    ROS: General:no colds or fevers, no weight changes Skin:no rashes or ulcers HEENT:no blurred vision, no congestion CV:see HPI PUL:see HPI GI:no diarrhea constipation or melena, no indigestion- now diarrhea. This since Sat.  Episodic gallbladder attacks. GU:no hematuria, no dysuria MS:no joint pain, no claudication Neuro:+ near syncope, no lightheadedness Endo:no diabetes, no thyroid disease   Blood pressure 129/95, pulse 80, temperature 98.4 F (36.9  C), temperature source Oral, resp. rate 20, height 6' 1"  (1.854 m), weight 188 lb (85.276  kg), SpO2 96 %.  Wt Readings from Last 3 Encounters:  03/21/15 188 lb (85.276 kg)  01/07/15 197 lb (89.359 kg)  12/29/14 190 lb (86.183 kg)    PE: General:Pleasant affect, NAD, still with nausea and has not been able to eat. Skin:Warm and dry, brisk capillary refill HEENT:normocephalic, sclera clear, mucus membranes moist Neck:supple, no JVD, no bruits  Heart:S1S2 RRR without murmur, gallup, rub or click Lungs:clear without rales, rhonchi, or wheezes ZOX:WRUE, + tenderness rt lower quad, + BS, do not palpate liver spleen or masses Ext:no lower ext edema, 2+ pedal pulses, 2+ radial pulses Neuro:alert and oriented X 3, MAE, follows commands, + facial symmetry    Assessment/Plan Principal Problem:   Near syncope- VS stable on admit, HR in the 70s.  May have been vasovagal  Neg MI, echo pending- last nuc 05/2014 - I could not find results.  Per note + scar.  Echo has been done. Results P - if Echo stable may be able to proceed with surgery.  MD to clarify.  Active Problems:    Shortness of breath on exertion   CAD in native artery- pt had been cleared for intermediate cardiac risk for surgery   RUQ abdominal pain    [facility]  Nurse Practitioner Certified [place] Pager [phone] or after 5pm or weekends call [phone] 03/22/2015, 11:32 AM    History and all data above reviewed.  Patient examined.  I agree with the findings as above.  The patient exam reveals COR:RRR  ,  Lungs: Clear  ,  Abd: Positive bowel sounds, no rebound no guarding, Ext No edema   .  All available labs, radiology testing, previous records reviewed. Agree with documented assessment and plan. No high risk findings.  Pain sounds GI.  Near syncope was likely vagal.  Echo is pending.  He is going for a procedure that is not high risk from a cardiovascular standpoint.  Therefore, based on ACC/AHA guidelines, the patient would be at acceptable risk for the planned procedure without further  cardiovascular testing.  I will look for the echo when it is available.    Jeneen Rinks Annalyssa Thune  12:27 PM  03/22/2015

## 2015-03-22 NOTE — Progress Notes (Signed)
[facility] PROGRESS NOTE   Sean Hammond BWI:[id number] DOB: Oct 27, 1941 DOA: 03/21/2015 PCP: Henrine Screws, MD  HPI/Subjective: Denies any chest pain, complains about significant nausea. Was not able to eat since Friday evening.  Assessment/Plan: Principal Problem:   Near syncope Active Problems:   Shortness of breath on exertion   CAD in native artery   RUQ abdominal pain   Presyncope Presented with lightheadedness, diaphoresis and presyncope. Rule out ACS, obtained 3 sets of cardiac enzymes and repeat EKG in the morning. 2-D echocardiogram, patient will be on telemetry to rule out arrhythmias.  Denies any chest pain this morning, has nausea.  RUQ abdominal pain Pain is going on for some time (about 6 months), being worked up for gallbladder problems. 2 ultrasounds and on a HIDA scan were negative for issues. Supposed to have cholecystectomy on 5/26, complaining about severe nausea, unable to tolerate diet. CT abdomen with contrast. Tenderness is in the right lower quadrant (at least to me)and has negative Murphy's sign. I will consult general surgery to evaluate him.   CAD History of stent in the circumflex artery in 2013, patient started complaining about exertional dyspnea/pain. Cardiology consulted for further evaluation.   Code Status: Full Code Family Communication: Plan discussed with the patient. Disposition Plan: Remains inpatient Diet: Diet Heart Room service appropriate?: Yes; Fluid consistency:: Thin  Consultants:  Cards  Procedures:  None  Antibiotics:  None   Objective: Filed Vitals:   03/22/15 0543  BP: 129/95  Pulse: 80  Temp: 98.4 F (36.9 C)  Resp: 20   No intake or output data in the 24 hours ending 03/22/15 1152 Filed Weights   03/21/15 1700  Weight: 85.276 kg (188 lb)    Exam: General: Alert and awake, oriented x3, not in any acute distress. HEENT: anicteric sclera, pupils reactive to light and accommodation,  EOMI CVS: S1-S2 clear, no murmur rubs or gallops Chest: clear to auscultation bilaterally, no wheezing, rales or rhonchi Abdomen: soft nontender, nondistended, normal bowel sounds, no organomegaly Extremities: no cyanosis, clubbing or edema noted bilaterally Neuro: Cranial nerves II-XII intact, no focal neurological deficits  Data Reviewed: Basic Metabolic Panel:  Recent Labs Lab 03/21/15 1101 03/22/15 0503  NA 134* 138  K 4.0 4.2  CL 106 104  CO2 21* 22  GLUCOSE 93 73  BUN 16 17  CREATININE 1.19 1.05  CALCIUM 8.8* 9.0   Liver Function Tests:  Recent Labs Lab 03/21/15 1101  AST 28  ALT 24  ALKPHOS 51  BILITOT 0.8  PROT 6.9  ALBUMIN 3.9    Recent Labs Lab 03/21/15 1101  LIPASE 17*   No results for input(s): AMMONIA in the last 168 hours. CBC:  Recent Labs Lab 03/21/15 1101 03/22/15 0503  WBC 8.3 6.3  HGB 16.8 15.4  HCT 48.3 45.3  MCV 92.0 91.3  PLT 165 153   Cardiac Enzymes:  Recent Labs Lab 03/21/15 1716 03/21/15 2300 03/22/15 0503  TROPONINI <0.03 <0.03 <0.03   BNP (last 3 results) No results for input(s): BNP in the last 8760 hours.  ProBNP (last 3 results) No results for input(s): PROBNP in the last 8760 hours.  CBG: No results for input(s): GLUCAP in the last 168 hours.  Micro No results found for this or any previous visit (from the past 240 hour(s)).   Studies: Dg Chest 2 View  03/21/2015   CLINICAL DATA:  Right-sided chest pain radiating to the upper abdomen. Scheduled cholecystectomy this month. Feels like gallbladder pain. Diaphoretic. History of  smoking.  EXAM: CHEST  2 VIEW  COMPARISON:  01/08/2013  FINDINGS: Lungs are mildly hyperinflated. No focal consolidations or pleural effusions. No pulmonary edema. Mild mid thoracic spondylosis.  IMPRESSION: No evidence for acute abnormality. Mild hyperinflation. Abnormality.   Electronically Signed   By: Nolon Nations M.D.   On: 03/21/2015 12:07   US Abdomen Limited  03/21/2015    CLINICAL DATA:  Three-day history of upper abdominal pain  EXAM: US ABDOMEN LIMITED - RIGHT UPPER QUADRANT  COMPARISON:  December 29, 2014  FINDINGS: Gallbladder:  No gallstones or wall thickening visualized. There is no pericholecystic fluid. No sonographic Murphy sign noted.  Common bile duct:  Diameter: 4 mm. There is no intrahepatic or extrahepatic biliary duct dilatation.  Liver:  No focal lesion identified. Within normal limits in parenchymal echogenicity.  IMPRESSION: Study within normal limits.   Electronically Signed   By: Lowella Grip III M.D.   On: 03/21/2015 13:31    Scheduled Meds: . aspirin EC  81 mg Oral Daily  . feeding supplement (ENSURE ENLIVE)  237 mL Oral BID BM  . metoprolol succinate  25 mg Oral Daily  . pantoprazole  40 mg Oral Daily  . sodium chloride  3 mL Intravenous Q12H   Continuous Infusions: . sodium chloride         Time spent: 35 minutes    [facility]  [facility] Pager [phone] If 7PM-7AM, please contact night-coverage at [email or web address], password [facility] 03/22/2015, 11:52 AM

## 2015-03-23 ENCOUNTER — Observation Stay (HOSPITAL_COMMUNITY): Payer: Medicare Other | Admitting: Anesthesiology

## 2015-03-23 ENCOUNTER — Encounter (HOSPITAL_COMMUNITY)
Admission: EM | Disposition: A | Discharge status: Home / Self Care | Payer: Self-pay | Source: Home / Self Care | Attending: Emergency Medicine

## 2015-03-23 ENCOUNTER — Encounter (HOSPITAL_COMMUNITY): Payer: Self-pay | Admitting: Anesthesiology

## 2015-03-23 ENCOUNTER — Observation Stay (HOSPITAL_COMMUNITY): Payer: Medicare Other

## 2015-03-23 DIAGNOSIS — K219 Gastro-esophageal reflux disease without esophagitis: Secondary | ICD-10-CM | POA: Diagnosis not present

## 2015-03-23 DIAGNOSIS — I2510 Atherosclerotic heart disease of native coronary artery without angina pectoris: Secondary | ICD-10-CM | POA: Diagnosis not present

## 2015-03-23 DIAGNOSIS — R1011 Right upper quadrant pain: Secondary | ICD-10-CM | POA: Diagnosis not present

## 2015-03-23 DIAGNOSIS — K4190 Unilateral femoral hernia, without obstruction or gangrene, not specified as recurrent: Secondary | ICD-10-CM | POA: Diagnosis not present

## 2015-03-23 DIAGNOSIS — R55 Syncope and collapse: Secondary | ICD-10-CM | POA: Diagnosis not present

## 2015-03-23 DIAGNOSIS — K429 Umbilical hernia without obstruction or gangrene: Secondary | ICD-10-CM | POA: Diagnosis not present

## 2015-03-23 DIAGNOSIS — K811 Chronic cholecystitis: Secondary | ICD-10-CM | POA: Diagnosis not present

## 2015-03-23 DIAGNOSIS — R0602 Shortness of breath: Secondary | ICD-10-CM | POA: Diagnosis not present

## 2015-03-23 DIAGNOSIS — K8080 Other cholelithiasis without obstruction: Secondary | ICD-10-CM | POA: Diagnosis not present

## 2015-03-23 DIAGNOSIS — N320 Bladder-neck obstruction: Secondary | ICD-10-CM | POA: Diagnosis not present

## 2015-03-23 DIAGNOSIS — K8020 Calculus of gallbladder without cholecystitis without obstruction: Secondary | ICD-10-CM | POA: Diagnosis not present

## 2015-03-23 HISTORY — PX: CHOLECYSTECTOMY: SHX55

## 2015-03-23 LAB — BASIC METABOLIC PANEL
ANION GAP: 6 (ref 5–15)
BUN: 14 mg/dL (ref 6–20)
CHLORIDE: 107 mmol/L (ref 101–111)
CO2: 26 mmol/L (ref 22–32)
Calcium: 8.4 mg/dL — ABNORMAL LOW (ref 8.9–10.3)
Creatinine, Ser: 1.08 mg/dL (ref 0.61–1.24)
GFR calc non Af Amer: >60 mL/min (ref >60)
Glucose, Bld: 122 mg/dL — ABNORMAL HIGH (ref 70–99)
POTASSIUM: 4 mmol/L (ref 3.5–5.1)
Sodium: 139 mmol/L (ref 135–145)

## 2015-03-23 LAB — CBC
HEMATOCRIT: 42.2 % (ref 39.0–52.0)
Hemoglobin: 14.4 g/dL (ref 13.0–17.0)
MCH: 31.1 pg (ref 26.0–34.0)
MCHC: 34.1 g/dL (ref 30.0–36.0)
MCV: 91.1 fL (ref 78.0–100.0)
PLATELETS: 162 10*3/uL (ref 150–400)
RBC: 4.63 MIL/uL (ref 4.22–5.81)
RDW: 13.4 % (ref 11.5–15.5)
WBC: 4.7 10*3/uL (ref 4.0–10.5)

## 2015-03-23 LAB — SURGICAL PCR SCREEN
MRSA, PCR: NEGATIVE
Staphylococcus aureus: NEGATIVE

## 2015-03-23 LAB — HEMOGLOBIN A1C
HEMOGLOBIN A1C: 5.7 % — AB (ref 4.8–5.6)
Mean Plasma Glucose: 117 mg/dL

## 2015-03-23 SURGERY — LAPAROSCOPIC CHOLECYSTECTOMY
Anesthesia: General | Site: Abdomen

## 2015-03-23 MED ORDER — SUCCINYLCHOLINE CHLORIDE 20 MG/ML IJ SOLN
INTRAMUSCULAR | Status: DC | PRN
  Administered 2015-03-23: 100 mg via INTRAVENOUS

## 2015-03-23 MED ORDER — LACTATED RINGERS IV SOLN
INTRAVENOUS | Status: DC
  Administered 2015-03-23: 09:00:00 via INTRAVENOUS
  Administered 2015-03-23: 1000 mL via INTRAVENOUS

## 2015-03-23 MED ORDER — NEOSTIGMINE METHYLSULFATE 10 MG/10ML IV SOLN
INTRAVENOUS | Status: AC
  Filled 2015-03-23: qty 1

## 2015-03-23 MED ORDER — GLYCOPYRROLATE 0.2 MG/ML IJ SOLN
INTRAMUSCULAR | Status: AC
  Filled 2015-03-23: qty 1

## 2015-03-23 MED ORDER — HYDROCODONE-ACETAMINOPHEN 5-325 MG PO TABS
1.0000 | ORAL_TABLET | ORAL | Status: DC | PRN
  Administered 2015-03-23 – 2015-03-25 (×7): 2 via ORAL
  Filled 2015-03-23 (×7): qty 2

## 2015-03-23 MED ORDER — HYDROMORPHONE HCL 1 MG/ML IJ SOLN
INTRAMUSCULAR | Status: AC
  Filled 2015-03-23: qty 1

## 2015-03-23 MED ORDER — FENTANYL CITRATE (PF) 100 MCG/2ML IJ SOLN
INTRAMUSCULAR | Status: DC | PRN
  Administered 2015-03-23: 50 ug via INTRAVENOUS
  Administered 2015-03-23: 100 ug via INTRAVENOUS

## 2015-03-23 MED ORDER — GLYCOPYRROLATE 0.2 MG/ML IJ SOLN
INTRAMUSCULAR | Status: DC | PRN
  Administered 2015-03-23: .8 mg via INTRAVENOUS

## 2015-03-23 MED ORDER — DEXTROSE-NACL 5-0.9 % IV SOLN
INTRAVENOUS | Status: DC
  Administered 2015-03-23 – 2015-03-24 (×3): via INTRAVENOUS

## 2015-03-23 MED ORDER — HYDROMORPHONE HCL 1 MG/ML IJ SOLN
0.2500 mg | INTRAMUSCULAR | Status: DC | PRN
  Administered 2015-03-23 (×3): 0.5 mg via INTRAVENOUS

## 2015-03-23 MED ORDER — GLYCOPYRROLATE 0.2 MG/ML IJ SOLN
INTRAMUSCULAR | Status: AC
  Filled 2015-03-23: qty 3

## 2015-03-23 MED ORDER — EPHEDRINE SULFATE 50 MG/ML IJ SOLN
INTRAMUSCULAR | Status: DC | PRN
  Administered 2015-03-23: 5 mg via INTRAVENOUS
  Administered 2015-03-23: 10 mg via INTRAVENOUS

## 2015-03-23 MED ORDER — LACTATED RINGERS IR SOLN
Status: DC | PRN
  Administered 2015-03-23: 1000 mL

## 2015-03-23 MED ORDER — PROMETHAZINE HCL 25 MG/ML IJ SOLN
INTRAMUSCULAR | Status: AC
  Filled 2015-03-23: qty 1

## 2015-03-23 MED ORDER — PROMETHAZINE HCL 25 MG/ML IJ SOLN
6.2500 mg | INTRAMUSCULAR | Status: AC | PRN
  Administered 2015-03-23 (×2): 6.25 mg via INTRAVENOUS

## 2015-03-23 MED ORDER — CISATRACURIUM BESYLATE 20 MG/10ML IV SOLN
INTRAVENOUS | Status: AC
  Filled 2015-03-23: qty 10

## 2015-03-23 MED ORDER — NEOSTIGMINE METHYLSULFATE 10 MG/10ML IV SOLN
INTRAVENOUS | Status: DC | PRN
  Administered 2015-03-23: 5 mg via INTRAVENOUS

## 2015-03-23 MED ORDER — PROPOFOL 10 MG/ML IV BOLUS
INTRAVENOUS | Status: DC | PRN
  Administered 2015-03-23: 150 mg via INTRAVENOUS

## 2015-03-23 MED ORDER — FENTANYL CITRATE (PF) 250 MCG/5ML IJ SOLN
INTRAMUSCULAR | Status: AC
  Filled 2015-03-23: qty 5

## 2015-03-23 MED ORDER — ONDANSETRON HCL 4 MG/2ML IJ SOLN
INTRAMUSCULAR | Status: DC | PRN
  Administered 2015-03-23: 4 mg via INTRAVENOUS

## 2015-03-23 MED ORDER — BUPIVACAINE-EPINEPHRINE (PF) 0.25% -1:200000 IJ SOLN
INTRAMUSCULAR | Status: DC | PRN
  Administered 2015-03-23: 7 mL

## 2015-03-23 MED ORDER — CEFAZOLIN SODIUM-DEXTROSE 2-3 GM-% IV SOLR
2.0000 g | Freq: Once | INTRAVENOUS | Status: AC
  Administered 2015-03-23: 2 g via INTRAVENOUS
  Filled 2015-03-23: qty 50

## 2015-03-23 MED ORDER — HYDROMORPHONE HCL 1 MG/ML IJ SOLN
0.5000 mg | INTRAMUSCULAR | Status: DC | PRN
  Administered 2015-03-23 – 2015-03-24 (×6): 0.5 mg via INTRAVENOUS
  Filled 2015-03-23 (×8): qty 1

## 2015-03-23 MED ORDER — CISATRACURIUM BESYLATE (PF) 10 MG/5ML IV SOLN
INTRAVENOUS | Status: DC | PRN
  Administered 2015-03-23: 4 mg via INTRAVENOUS

## 2015-03-23 MED ORDER — PROPOFOL 10 MG/ML IV BOLUS
INTRAVENOUS | Status: AC
Start: 2015-03-23 — End: 2015-03-23
  Filled 2015-03-23: qty 20

## 2015-03-23 MED ORDER — CEFAZOLIN SODIUM-DEXTROSE 2-3 GM-% IV SOLR
INTRAVENOUS | Status: AC
  Filled 2015-03-23: qty 50

## 2015-03-23 MED ORDER — ONDANSETRON HCL 4 MG/2ML IJ SOLN
INTRAMUSCULAR | Status: AC
  Filled 2015-03-23: qty 2

## 2015-03-23 MED ORDER — BUPIVACAINE-EPINEPHRINE (PF) 0.25% -1:200000 IJ SOLN
INTRAMUSCULAR | Status: AC
  Filled 2015-03-23: qty 30

## 2015-03-23 SURGICAL SUPPLY — 50 items
APL SKNCLS STERI-STRIP NONHPOA (GAUZE/BANDAGES/DRESSINGS) ×1
APPLIER CLIP 5 13 M/L LIGAMAX5 (MISCELLANEOUS)
APR CLP MED LRG 5 ANG JAW (MISCELLANEOUS)
BAG SPEC RTRVL 10 TROC 200 (ENDOMECHANICALS) ×1
BENZOIN TINCTURE PRP APPL 2/3 (GAUZE/BANDAGES/DRESSINGS) ×2 IMPLANT
CABLE HIGH FREQUENCY MONO STRZ (ELECTRODE) ×3 IMPLANT
CHLORAPREP W/TINT 26ML (MISCELLANEOUS) ×3 IMPLANT
CLIP APPLIE 5 13 M/L LIGAMAX5 (MISCELLANEOUS) IMPLANT
CLIP LIGATING HEMO O LOK GREEN (MISCELLANEOUS) ×5 IMPLANT
CLOSURE WOUND 1/2 X4 (GAUZE/BANDAGES/DRESSINGS) ×1
COVER MAYO STAND STRL (DRAPES) IMPLANT
COVER TRANSDUCER ULTRASND (DRAPES) ×3 IMPLANT
DECANTER SPIKE VIAL GLASS SM (MISCELLANEOUS) ×3 IMPLANT
DEVICE TROCAR PUNCTURE CLOSURE (ENDOMECHANICALS) ×5 IMPLANT
DRAPE C-ARM 42X120 X-RAY (DRAPES) IMPLANT
DRAPE LAPAROSCOPIC ABDOMINAL (DRAPES) ×3 IMPLANT
DRAPE UTILITY XL STRL (DRAPES) ×3 IMPLANT
ELECT REM PT RETURN 9FT ADLT (ELECTROSURGICAL) ×3
ELECTRODE REM PT RTRN 9FT ADLT (ELECTROSURGICAL) ×1 IMPLANT
GAUZE SPONGE 2X2 8PLY STRL LF (GAUZE/BANDAGES/DRESSINGS) ×1 IMPLANT
GAUZE SPONGE 4X4 12PLY STRL (GAUZE/BANDAGES/DRESSINGS) ×3 IMPLANT
GLOVE BIO SURGEON STRL SZ7 (GLOVE) ×4 IMPLANT
GLOVE BIO SURGEON STRL SZ7.5 (GLOVE) ×3 IMPLANT
GLOVE BIOGEL PI IND STRL 7.0 (GLOVE) IMPLANT
GLOVE BIOGEL PI IND STRL 7.5 (GLOVE) IMPLANT
GLOVE BIOGEL PI INDICATOR 7.0 (GLOVE) ×2
GLOVE BIOGEL PI INDICATOR 7.5 (GLOVE) ×2
GLOVE ECLIPSE 7.0 STRL STRAW (GLOVE) ×2 IMPLANT
GOWN STRL REUS W/ TWL LRG LVL4 (GOWN DISPOSABLE) IMPLANT
GOWN STRL REUS W/TWL LRG LVL4 (GOWN DISPOSABLE) ×3
GOWN STRL REUS W/TWL XL LVL3 (GOWN DISPOSABLE) ×8 IMPLANT
HEMOSTAT SURGICEL 4X8 (HEMOSTASIS) IMPLANT
KIT BASIN OR (CUSTOM PROCEDURE TRAY) ×3 IMPLANT
NDL INSUFFLATION 14GA 120MM (NEEDLE) ×1 IMPLANT
NEEDLE INSUFFLATION 14GA 120MM (NEEDLE) ×3 IMPLANT
POUCH RETRIEVAL ECOSAC 10 (ENDOMECHANICALS) IMPLANT
POUCH RETRIEVAL ECOSAC 10MM (ENDOMECHANICALS) ×2
SCISSORS LAP 5X35 DISP (ENDOMECHANICALS) ×3 IMPLANT
SET CHOLANGIOGRAPH MIX (MISCELLANEOUS) IMPLANT
SET IRRIG TUBING LAPAROSCOPIC (IRRIGATION / IRRIGATOR) ×3 IMPLANT
SPONGE GAUZE 2X2 STER 10/PKG (GAUZE/BANDAGES/DRESSINGS) ×2
STRIP CLOSURE SKIN 1/2X4 (GAUZE/BANDAGES/DRESSINGS) ×2 IMPLANT
SUT MNCRL AB 4-0 PS2 18 (SUTURE) ×3 IMPLANT
TAPE CLOTH SURG 4X10 WHT LF (GAUZE/BANDAGES/DRESSINGS) ×2 IMPLANT
TOWEL OR 17X26 10 PK STRL BLUE (TOWEL DISPOSABLE) ×3 IMPLANT
TOWEL OR NON WOVEN STRL DISP B (DISPOSABLE) ×3 IMPLANT
TRAY LAPAROSCOPIC (CUSTOM PROCEDURE TRAY) ×3 IMPLANT
TROCAR BLADELESS OPT 5 75 (ENDOMECHANICALS) ×3 IMPLANT
TROCAR SLEEVE XCEL 5X75 (ENDOMECHANICALS) ×3 IMPLANT
TROCAR XCEL NON-BLD 11X100MML (ENDOMECHANICALS) ×3 IMPLANT

## 2015-03-23 NOTE — Anesthesia Preprocedure Evaluation (Addendum)
Anesthesia Evaluation  Patient identified by MRN, date of birth, ID band Patient awake    Reviewed: Allergy & Precautions, NPO status , Patient's Chart, lab work & pertinent test results  Airway Mallampati: II  TM Distance: >3 FB Neck ROM: Full    Dental no notable dental hx.    Pulmonary shortness of breath and with exertion, sleep apnea , former smoker,  breath sounds clear to auscultation  Pulmonary exam normal       Cardiovascular Exercise Tolerance: Good + angina with exertion + CAD and + Past MI Rhythm:Regular Rate:Normal  Cardiology office note Dr. [name] 01-07-15. 2 stents to circumflex in 2013. Cleared for cholecystectomy.  EF 44%. Previous inferior MI   Neuro/Psych  Headaches, negative psych ROS   GI/Hepatic Neg liver ROS, GERD-  ,  Endo/Other  negative endocrine ROS  Renal/GU negative Renal ROS  negative genitourinary   Musculoskeletal  (+) Arthritis -,   Abdominal   Peds negative pediatric ROS (+)  Hematology negative hematology ROS (+)   Anesthesia Other Findings   Reproductive/Obstetrics negative OB ROS                            Anesthesia Physical Anesthesia Plan  ASA: III  Anesthesia Plan: General   Post-op Pain Management:    Induction: Intravenous  Airway Management Planned: Oral ETT  Additional Equipment:   Intra-op Plan:   Post-operative Plan: Extubation in OR  Informed Consent: I have reviewed the patients History and Physical, chart, labs and discussed the procedure including the risks, benefits and alternatives for the proposed anesthesia with the patient or authorized representative who has indicated his/her understanding and acceptance.   Dental advisory given  Plan Discussed with: CRNA  Anesthesia Plan Comments:         Anesthesia Quick Evaluation

## 2015-03-23 NOTE — Progress Notes (Signed)
Day of Surgery  Subjective: NO changes  Objective: Vital signs in last 24 hours: Temp:  [97.8 F (36.6 C)-98.7 F (37.1 C)] 97.8 F (36.6 C) (05/03 0528) Pulse Rate:  [62-81] 64 (05/03 0813) Resp:  [18] 18 (05/03 0528) BP: (111-128)/(64-74) 124/68 mmHg (05/03 0813) SpO2:  [98 %-99 %] 98 % (05/03 0528) Weight:  [82.2 kg (181 lb 3.5 oz)-82.3 kg (181 lb 7 oz)] 82.2 kg (181 lb 3.5 oz) (05/03 0528) Last BM Date: 03/21/15  Intake/Output from previous day: 05/02 0701 - 05/03 0700 In: 1363.8 [P.O.:240; I.V.:1123.8] Out: 1 [Urine:1] Intake/Output this shift:    GI: soft, non-tender; bowel sounds normal; no masses,  no organomegaly  Lab Results:   Recent Labs  03/22/15 0503 03/23/15 0500  WBC 6.3 4.7  HGB 15.4 14.4  HCT 45.3 42.2  PLT 153 162   BMET  Recent Labs  03/22/15 0503 03/23/15 0500  NA 138 139  K 4.2 4.0  CL 104 107  CO2 22 26  GLUCOSE 73 122*  BUN 17 14  CREATININE 1.05 1.08  CALCIUM 9.0 8.4*   PT/INR  Recent Labs  03/21/15 1716  LABPROT 14.0  INR 1.06   ABG No results for input(s): PHART, HCO3 in the last 72 hours.  Invalid input(s): PCO2, PO2  Studies/Results: Dg Chest 2 View  03/21/2015   CLINICAL DATA:  Right-sided chest pain radiating to the upper abdomen. Scheduled cholecystectomy this month. Feels like gallbladder pain. Diaphoretic. History of smoking.  EXAM: CHEST  2 VIEW  COMPARISON:  01/08/2013  FINDINGS: Lungs are mildly hyperinflated. No focal consolidations or pleural effusions. No pulmonary edema. Mild mid thoracic spondylosis.  IMPRESSION: No evidence for acute abnormality. Mild hyperinflation. Abnormality.   Electronically Signed   By: Nolon Nations M.D.   On: 03/21/2015 12:07   Ct Abdomen Pelvis W Contrast  03/22/2015   CLINICAL DATA:  Right lower quadrant pain for 6 months.  EXAM: CT ABDOMEN AND PELVIS WITH CONTRAST  TECHNIQUE: Multidetector CT imaging of the abdomen and pelvis was performed using the standard protocol following  bolus administration of intravenous contrast.  CONTRAST:  1 OMNIPAQUE IOHEXOL 300 MG/ML SOLN, 123mL OMNIPAQUE IOHEXOL 300 MG/ML SOLN  COMPARISON:  01/08/2012  FINDINGS: BODY WALL: No contributory findings.  LOWER CHEST: No contributory findings.  ABDOMEN/PELVIS:  Liver: Presumed calcified granuloma in the right liver. Punctate density in the anterior segment right liver is too small to characterize but statistically a cyst.  Biliary: Negative  Pancreas:Fatty atrophy without inflammation or duct enlargement.  Spleen: Granulomatous changes.  Adrenals: Unremarkable.  Kidneys and ureters: No hydronephrosis or stone. 1 cm cyst from the lower pole left kidney.  Bladder: Unremarkable.  Reproductive: Left varicocele.  Bowel: Extensive distal colonic diverticulosis without active inflammation. Small mid duodenal diverticulum, also uncomplicated. Normal appendix. No bowel obstruction.  Retroperitoneum: Single chronically enlarged lymph node in the small bowel mesentery on image 35. Even when accounting for 3 mm of growth since 2013, relative stability and isolated nodal enlargement suggest this is an incidental finding. Granulomatous changes present in the portacaval lymph node.  Peritoneum: No ascites or pneumoperitoneum.  Vascular: No acute abnormality.  OSSEOUS: No acute abnormalities.  IMPRESSION: 1. No appendicitis or other explanation for acute right lower quadrant pain. 2. Single enlarged mesenteric lymph node of doubtful clinical significance given minimal growth since in 2013. 3. Distal colonic diverticulosis.   Electronically Signed   By: Monte Fantasia M.D.   On: 03/22/2015 17:56   US Abdomen Limited  03/21/2015   CLINICAL DATA:  Three-day history of upper abdominal pain  EXAM: US ABDOMEN LIMITED - RIGHT UPPER QUADRANT  COMPARISON:  December 29, 2014  FINDINGS: Gallbladder:  No gallstones or wall thickening visualized. There is no pericholecystic fluid. No sonographic Murphy sign noted.  Common bile duct:   Diameter: 4 mm. There is no intrahepatic or extrahepatic biliary duct dilatation.  Liver:  No focal lesion identified. Within normal limits in parenchymal echogenicity.  IMPRESSION: Study within normal limits.   Electronically Signed   By: Lowella Grip III M.D.   On: 03/21/2015 13:31    Anti-infectives: Anti-infectives    Start     Dose/Rate Route Frequency Ordered Stop   03/23/15 0815  ceFAZolin (ANCEF) IVPB 2 g/50 mL premix     2 g 100 mL/hr over 30 Minutes Intravenous  Once 03/23/15 0813        Assessment/Plan:  To OR for lap chole     Rosario Jacks., Anne Hahn 03/23/2015

## 2015-03-23 NOTE — Progress Notes (Signed)
UR completed 

## 2015-03-23 NOTE — Anesthesia Procedure Notes (Signed)
Procedure Name: Intubation Date/Time: 03/23/2015 9:17 AM Performed by: Johnathan Hausen A Pre-anesthesia Checklist: Patient identified, Emergency Drugs available, Suction available, Patient being monitored and Timeout performed Patient Re-evaluated:Patient Re-evaluated prior to inductionOxygen Delivery Method: Circle system utilized Preoxygenation: Pre-oxygenation with 100% oxygen Intubation Type: Combination inhalational/ intravenous induction Ventilation: Mask ventilation without difficulty Laryngoscope Size: Mac and 4 Grade View: Grade I Tube type: Oral Tube size: 8.0 mm Number of attempts: 1 Airway Equipment and Method: Stylet Placement Confirmation: ETT inserted through vocal cords under direct vision,  positive ETCO2 and breath sounds checked- equal and bilateral Secured at: 21 (Tube placed by EMT student under supervision.) cm Tube secured with: Tape Dental Injury: Teeth and Oropharynx as per pre-operative assessment

## 2015-03-23 NOTE — Progress Notes (Addendum)
Pt has not peed in over 8 hours.  Bladder scan showing 499 cc's.  Pt states this is normal, usually pees once in morning and once at night.  MD made aware.  Will continue to monitor closely.    MD gave verbal order for one time in and out cath, pt refused.  Will continue to monitor pt closely and encourage to try voiding.

## 2015-03-23 NOTE — Op Note (Signed)
03/23/2015  10:21 AM  PATIENT:  Sean Hammond  74 y.o. male  PRE-OPERATIVE DIAGNOSIS:  gallstones, umbilical hernia  POST-OPERATIVE DIAGNOSIS:  gallstones, umbilical hernia  PROCEDURE:  Procedure(s): LAPAROSCOPIC CHOLECYSTECTOMY (N/A)  SURGEON:  Surgeon(s) and Role:    * Ralene Ok, MD - Primary  ANESTHESIA:   local and general  EBL:   <5cc  BLOOD ADMINISTERED:none  DRAINS: none   LOCAL MEDICATIONS USED:  BUPIVICAINE   SPECIMEN:  Source of Specimen:  gallbladder  DISPOSITION OF SPECIMEN:  PATHOLOGY  COUNTS:  YES  TOURNIQUET:  * No tourniquets in log *  DICTATION: .Dragon Dictation  Findings:chronically inflammed gallbladder  Indications for procedure: Pt is a 73 with RUQ pain.  He had an extensive workup for biliary in origin.  This was negative, however the patient wished to have his gallbladder out in hopes that this would relieve his pain.  Details of the procedure:  The patient was taken to the operating and placed in the supine position with bilateral SCDs in place. A time out was called and all facts were verified. A pneumoperitoneum was obtained via A Veress needle technique to a pressure of 3mm of mercury. A 44mm trochar was then placed in the right upper quadrant under visualization, and there were no injuries to any abdominal organs. A 11 mm port was then placed in the umbilical region after infiltrating with local anesthesia under direct visualization. A second epigastric port was placed under direct visualization. The gallbladder was identified and retracted, the peritoneum was then sharply dissected from the gallbladder and this dissection was carried down to Calot's triangle. The cystic duct was identified and stripped away circumferentially and seen going into the gallbladder 360, the critical angle was obtained.  2 clips were placed proximally one distally and the cystic duct transected. The cystic artery was identified and 2 clips placed proximally and one  distally and transected. We then proceeded to remove the gallbladder off the hepatic fossa with Bovie cautery. A retrieval bag was then placed in the abdomen and gallbladder placed in the bag. The hepatic fossa was then reexamined and hemostasis was achieved with Bovie cautery and was excellent at this portion of the case. The subhepatic fossa and perihepatic fossa was then irrigated until the effluent was clear. The bag and its contents were removed in there entirety. The 11 mm trocar fascia was reapproximated with the Endo Close #1 Vicryl x2, and this approximated the fascia at the umbilical hernia site. The pneumoperitoneum was evacuated and all trochars removed under direct visulalization. The skin was then closed with 4-0 Monocryl and the skin dressed with Steri-Strips, gauze, and tape. The patient was awaken from general anesthesia and taken to the recovery room in stable condition.   PLAN OF CARE: Admit for overnight observation  PATIENT DISPOSITION:  PACU - hemodynamically stable.   Delay start of Pharmacological VTE agent (>24hrs) due to surgical blood loss or risk of bleeding: not applicable

## 2015-03-23 NOTE — Transfer of Care (Signed)
Immediate Anesthesia Transfer of Care Note  Patient: Sean Hammond  Procedure(s) Performed: Procedure(s): LAPAROSCOPIC CHOLECYSTECTOMY (N/A)  Patient Location: PACU  Anesthesia Type:General  Level of Consciousness: awake, sedated and patient cooperative  Airway & Oxygen Therapy: Patient Spontanous Breathing and Patient connected to face mask oxygen  Post-op Assessment: Report given to RN and Post -op Vital signs reviewed and stable  Post vital signs: Reviewed and stable  Last Vitals:  Filed Vitals:   03/23/15 0813  BP: 124/68  Pulse: 64  Temp:   Resp:     Complications: No apparent anesthesia complications

## 2015-03-23 NOTE — Anesthesia Postprocedure Evaluation (Signed)
  Anesthesia Post-op Note  Patient: Sean Hammond  Procedure(s) Performed: Procedure(s) (LRB): LAPAROSCOPIC CHOLECYSTECTOMY (N/A)  Patient Location: PACU  Anesthesia Type: General  Level of Consciousness: awake and alert   Airway and Oxygen Therapy: Patient Spontanous Breathing  Post-op Pain: mild  Post-op Assessment: Post-op Vital signs reviewed, Patient's Cardiovascular Status Stable, Respiratory Function Stable, Patent Airway and No signs of Nausea or vomiting  Last Vitals:  Filed Vitals:   03/23/15 1144  BP: 143/87  Pulse: 70  Temp: 36.3 C  Resp: 14    Post-op Vital Signs: stable   Complications: No apparent anesthesia complications

## 2015-03-23 NOTE — Progress Notes (Signed)
[facility] PROGRESS NOTE   Sean Hammond SPQ:[id number] DOB: 1941/07/03 DOA: 03/21/2015 PCP: Henrine Screws, MD  HPI/Subjective: Laparoscopic cholecystectomy done earlier today. Patient evaluated afterwards, daughter at bedside, denies any significant pain  Assessment/Plan: Principal Problem:   Near syncope Active Problems:   Shortness of breath on exertion   CAD in native artery   RUQ abdominal pain   Presyncope Presented with lightheadedness, diaphoresis and presyncope. Rule out ACS, obtained 3 sets of cardiac enzymes and repeat EKG in the morning. Telemetry reviewed, no arrhythmias, 2-D echo looks okay. No further evaluation, this is likely secondary to the pain from RUQ.  RUQ abdominal pain Pain is going on for some time (about 6 months), being worked up for gallbladder problems. 2 ultrasounds and on a HIDA scan were negative for issues. Status post laparoscopic cholecystectomy on 03/23/15, stated that he feels much better. Likely can be discharged in the morning if tolerated diet.  CAD History of stent in the circumflex artery in 2013, patient started complaining about exertional dyspnea/pain. Cardiology evaluated the patient, no further evaluation.   Code Status: Full Code Family Communication: Plan discussed with the patient. Disposition Plan: Remains inpatient Diet: Diet clear liquid Room service appropriate?: Yes; Fluid consistency:: Thin  Consultants:  Cards  Procedures:  None  Antibiotics:  None   Objective: Filed Vitals:   03/23/15 1542  BP: 121/74  Pulse: 87  Temp: 97.5 F (36.4 C)  Resp: 18    Intake/Output Summary (Last 24 hours) at 03/23/15 1602 Last data filed at 03/23/15 1058  Gross per 24 hour  Intake 2123.75 ml  Output      1 ml  Net 2122.75 ml   Filed Weights   03/21/15 1700 03/22/15 1451 03/23/15 0528  Weight: 85.276 kg (188 lb) 82.3 kg (181 lb 7 oz) 82.2 kg (181 lb 3.5 oz)    Exam: General: Alert and awake,  oriented x3, not in any acute distress. HEENT: anicteric sclera, pupils reactive to light and accommodation, EOMI CVS: S1-S2 clear, no murmur rubs or gallops Chest: clear to auscultation bilaterally, no wheezing, rales or rhonchi Abdomen: soft nontender, nondistended, normal bowel sounds, no organomegaly Extremities: no cyanosis, clubbing or edema noted bilaterally Neuro: Cranial nerves II-XII intact, no focal neurological deficits  Data Reviewed: Basic Metabolic Panel:  Recent Labs Lab 03/21/15 1101 03/22/15 0503 03/23/15 0500  NA 134* 138 139  K 4.0 4.2 4.0  CL 106 104 107  CO2 21* 22 26  GLUCOSE 93 73 122*  BUN 16 17 14   CREATININE 1.19 1.05 1.08  CALCIUM 8.8* 9.0 8.4*   Liver Function Tests:  Recent Labs Lab 03/21/15 1101  AST 28  ALT 24  ALKPHOS 51  BILITOT 0.8  PROT 6.9  ALBUMIN 3.9    Recent Labs Lab 03/21/15 1101  LIPASE 17*   No results for input(s): AMMONIA in the last 168 hours. CBC:  Recent Labs Lab 03/21/15 1101 03/22/15 0503 03/23/15 0500  WBC 8.3 6.3 4.7  HGB 16.8 15.4 14.4  HCT 48.3 45.3 42.2  MCV 92.0 91.3 91.1  PLT 165 153 162   Cardiac Enzymes:  Recent Labs Lab 03/21/15 1716 03/21/15 2300 03/22/15 0503  TROPONINI <0.03 <0.03 <0.03   BNP (last 3 results) No results for input(s): BNP in the last 8760 hours.  ProBNP (last 3 results) No results for input(s): PROBNP in the last 8760 hours.  CBG: No results for input(s): GLUCAP in the last 168 hours.  Micro Recent Results (from the past  240 hour(s))  Surgical pcr screen     Status: None   Collection Time: 03/22/15 11:17 PM  Result Value Ref Range Status   MRSA, PCR NEGATIVE NEGATIVE Final   Staphylococcus aureus NEGATIVE NEGATIVE Final    Comment:        The Xpert SA Assay (FDA approved for NASAL specimens in patients over 45 years of age), is one component of a comprehensive surveillance program.  Test performance has been validated by [facility] for patients  greater than or equal to 85 year old. It is not intended to diagnose infection nor to guide or monitor treatment.      Studies: Ct Abdomen Pelvis W Contrast  03/22/2015   CLINICAL DATA:  Right lower quadrant pain for 6 months.  EXAM: CT ABDOMEN AND PELVIS WITH CONTRAST  TECHNIQUE: Multidetector CT imaging of the abdomen and pelvis was performed using the standard protocol following bolus administration of intravenous contrast.  CONTRAST:  1 OMNIPAQUE IOHEXOL 300 MG/ML SOLN, 196mL OMNIPAQUE IOHEXOL 300 MG/ML SOLN  COMPARISON:  01/08/2012  FINDINGS: BODY WALL: No contributory findings.  LOWER CHEST: No contributory findings.  ABDOMEN/PELVIS:  Liver: Presumed calcified granuloma in the right liver. Punctate density in the anterior segment right liver is too small to characterize but statistically a cyst.  Biliary: Negative  Pancreas:Fatty atrophy without inflammation or duct enlargement.  Spleen: Granulomatous changes.  Adrenals: Unremarkable.  Kidneys and ureters: No hydronephrosis or stone. 1 cm cyst from the lower pole left kidney.  Bladder: Unremarkable.  Reproductive: Left varicocele.  Bowel: Extensive distal colonic diverticulosis without active inflammation. Small mid duodenal diverticulum, also uncomplicated. Normal appendix. No bowel obstruction.  Retroperitoneum: Single chronically enlarged lymph node in the small bowel mesentery on image 35. Even when accounting for 3 mm of growth since 2013, relative stability and isolated nodal enlargement suggest this is an incidental finding. Granulomatous changes present in the portacaval lymph node.  Peritoneum: No ascites or pneumoperitoneum.  Vascular: No acute abnormality.  OSSEOUS: No acute abnormalities.  IMPRESSION: 1. No appendicitis or other explanation for acute right lower quadrant pain. 2. Single enlarged mesenteric lymph node of doubtful clinical significance given minimal growth since in 2013. 3. Distal colonic diverticulosis.   Electronically  Signed   By: Monte Fantasia M.D.   On: 03/22/2015 17:56    Scheduled Meds: . aspirin EC  81 mg Oral Daily  . feeding supplement (ENSURE ENLIVE)  237 mL Oral BID BM  . HYDROmorphone      . HYDROmorphone      . metoprolol succinate  25 mg Oral Daily  . pantoprazole  40 mg Oral Daily  . promethazine      . sodium chloride  3 mL Intravenous Q12H   Continuous Infusions: . sodium chloride    . dextrose 5 % and 0.9% NaCl 75 mL/hr at 03/23/15 1219       Time spent: 35 minutes    [facility]  [facility] Pager [phone] If 7PM-7AM, please contact night-coverage at [email or web address], password [facility] 03/23/2015, 4:02 PM

## 2015-03-23 NOTE — Progress Notes (Signed)
Beta blocker not given prior to pt being taken down for surgery.  Tubed medication down to OR per request.

## 2015-03-23 NOTE — Progress Notes (Signed)
Pt had not urinated prior to surgery. Dayshift obtained order to I&O cath patient if patient had not urinated by the time he would like to sleep. Pt attempted to urinate for 1 hour but was unsuccessful. I&O cath was completed and patient urinated 900 ml. Pt reported decreased abdominal pressure. Urine was amber. [place], NT was present as Producer, television/film/video. Pt states he doesn't urinate much during his normal day. Usually once in the morning and once in the evening. Will watch patient's urinary output carefully. Will continue to monitor.

## 2015-03-24 ENCOUNTER — Encounter (HOSPITAL_COMMUNITY): Payer: Self-pay | Admitting: General Surgery

## 2015-03-24 DIAGNOSIS — K8080 Other cholelithiasis without obstruction: Secondary | ICD-10-CM | POA: Diagnosis not present

## 2015-03-24 DIAGNOSIS — I2510 Atherosclerotic heart disease of native coronary artery without angina pectoris: Secondary | ICD-10-CM | POA: Diagnosis not present

## 2015-03-24 DIAGNOSIS — R55 Syncope and collapse: Secondary | ICD-10-CM | POA: Diagnosis not present

## 2015-03-24 LAB — CBC
HEMATOCRIT: 42.1 % (ref 39.0–52.0)
Hemoglobin: 14.1 g/dL (ref 13.0–17.0)
MCH: 30.8 pg (ref 26.0–34.0)
MCHC: 33.5 g/dL (ref 30.0–36.0)
MCV: 91.9 fL (ref 78.0–100.0)
PLATELETS: 146 10*3/uL — AB (ref 150–400)
RBC: 4.58 MIL/uL (ref 4.22–5.81)
RDW: 13.5 % (ref 11.5–15.5)
WBC: 9.1 10*3/uL (ref 4.0–10.5)

## 2015-03-24 LAB — BASIC METABOLIC PANEL
ANION GAP: 6 (ref 5–15)
BUN: 7 mg/dL (ref 6–20)
CO2: 27 mmol/L (ref 22–32)
Calcium: 8.5 mg/dL — ABNORMAL LOW (ref 8.9–10.3)
Chloride: 107 mmol/L (ref 101–111)
Creatinine, Ser: 0.95 mg/dL (ref 0.61–1.24)
GFR calc Af Amer: >60 mL/min (ref >60)
GFR calc non Af Amer: >60 mL/min (ref >60)
Glucose, Bld: 131 mg/dL — ABNORMAL HIGH (ref 70–99)
POTASSIUM: 4 mmol/L (ref 3.5–5.1)
SODIUM: 140 mmol/L (ref 135–145)

## 2015-03-24 MED ORDER — POLYETHYLENE GLYCOL 3350 17 G PO PACK
17.0000 g | PACK | Freq: Two times a day (BID) | ORAL | Status: DC
  Administered 2015-03-25: 17 g via ORAL
  Filled 2015-03-24 (×3): qty 1

## 2015-03-24 MED ORDER — HEPARIN SODIUM (PORCINE) 5000 UNIT/ML IJ SOLN: 5000.0000 [IU] | Freq: Three times a day (TID) | INTRAMUSCULAR | Status: DC

## 2015-03-24 MED ORDER — TAMSULOSIN HCL 0.4 MG PO CAPS
0.4000 mg | ORAL_CAPSULE | Freq: Every day | ORAL | Status: DC
  Administered 2015-03-24: 0.4 mg via ORAL
  Filled 2015-03-24: qty 1

## 2015-03-24 MED ORDER — DOCUSATE SODIUM 100 MG PO CAPS
200.0000 mg | ORAL_CAPSULE | Freq: Two times a day (BID) | ORAL | Status: DC
  Administered 2015-03-24 – 2015-03-25 (×2): 200 mg via ORAL
  Filled 2015-03-24 (×3): qty 2

## 2015-03-24 MED ORDER — CALCIUM CARBONATE ANTACID 500 MG PO CHEW
400.0000 mg | CHEWABLE_TABLET | Freq: Three times a day (TID) | ORAL | Status: DC | PRN
  Administered 2015-03-24: 400 mg via ORAL
  Filled 2015-03-24: qty 2

## 2015-03-24 NOTE — Progress Notes (Signed)
[facility] PROGRESS NOTE   Sean Hammond YIR:[id number] DOB: 08/08/1941 DOA: 03/21/2015 PCP: Henrine Screws, MD  HPI/Subjective: Laparoscopic cholecystectomy done earlier today. Patient evaluated afterwards, daughter at bedside, denies any significant pain  Assessment/Plan:  Presyncope likely due to vagal stimulation from pain Presented with lightheadedness, diaphoresis and presyncope. Ruled out ACS, -ve 3 sets of cardiac enzymes and stable repeat EKG  Telemetry reviewed, no arrhythmias, 2-D echo is stable. No further evaluation, this is likely secondary to the pain from RUQ.   RUQ abdominal pain Pain is going on for some time (about 6 months), and by general surgery underwent laparoscopic cholecystectomy on 03/23/2015 Stable for now.   CAD History of stent in the circumflex artery in 2013, patient started complaining about exertional dyspnea/pain. Cardiology evaluated the patient, no further evaluation.   Post op bladder outlet obstruction.  Straight cath 2 still has 900 mL residual. He is also constipated. We will place on Flomax, Foley, outpatient urology follow-up. Stool softeners.    Code Status: Full Code Family Communication: Plan discussed with the patient. Disposition Plan: Remains inpatient Diet: Diet Heart Room service appropriate?: Yes; Fluid consistency:: Thin  Consultants:  Cards, CCS  Procedures: . Laparoscopic cholecystectomy 03/23/2015 Echogram. Showing EF of 52% with normal motion abnormalities  Antibiotics:  None   Objective: Filed Vitals:   03/24/15 0526  BP: 151/73  Pulse: 84  Temp: 98 F (36.7 C)  Resp: 18    Intake/Output Summary (Last 24 hours) at 03/24/15 1030 Last data filed at 03/24/15 0931  Gross per 24 hour  Intake    983 ml  Output    900 ml  Net     83 ml   Filed Weights   03/21/15 1700 03/22/15 1451 03/23/15 0528  Weight: 85.276 kg (188 lb) 82.3 kg (181 lb 7 oz) 82.2 kg (181 lb 3.5 oz)     Exam: General: Alert and awake, oriented x3, not in any acute distress. HEENT: anicteric sclera, pupils reactive to light and accommodation, EOMI CVS: S1-S2 clear, no murmur rubs or gallops Chest: clear to auscultation bilaterally, no wheezing, rales or rhonchi Abdomen:  nontender, distended, normal bowel sounds, no organomegaly Extremities: no cyanosis, clubbing or edema noted bilaterally Neuro: Cranial nerves II-XII intact, no focal neurological deficits  Data Reviewed: Basic Metabolic Panel:  Recent Labs Lab 03/21/15 1101 03/22/15 0503 03/23/15 0500 03/24/15 0453  NA 134* 138 139 140  Hammond 4.0 4.2 4.0 4.0  CL 106 104 107 107  CO2 21* 22 26 27   GLUCOSE 93 73 122* 131*  BUN 16 17 14 7   CREATININE 1.19 1.05 1.08 0.95  CALCIUM 8.8* 9.0 8.4* 8.5*   Liver Function Tests:  Recent Labs Lab 03/21/15 1101  AST 28  ALT 24  ALKPHOS 51  BILITOT 0.8  PROT 6.9  ALBUMIN 3.9    Recent Labs Lab 03/21/15 1101  LIPASE 17*   No results for input(s): AMMONIA in the last 168 hours. CBC:  Recent Labs Lab 03/21/15 1101 03/22/15 0503 03/23/15 0500 03/24/15 0453  WBC 8.3 6.3 4.7 9.1  HGB 16.8 15.4 14.4 14.1  HCT 48.3 45.3 42.2 42.1  MCV 92.0 91.3 91.1 91.9  PLT 165 153 162 146*   Cardiac Enzymes:  Recent Labs Lab 03/21/15 1716 03/21/15 2300 03/22/15 0503  TROPONINI <0.03 <0.03 <0.03   BNP (last 3 results) No results for input(s): BNP in the last 8760 hours.  ProBNP (last 3 results) No results for input(s): PROBNP in the last 8760 hours.  CBG: No results for input(s): GLUCAP in the last 168 hours.  Micro Recent Results (from the past 240 hour(s))  Surgical pcr screen     Status: None   Collection Time: 03/22/15 11:17 PM  Result Value Ref Range Status   MRSA, PCR NEGATIVE NEGATIVE Final   Staphylococcus aureus NEGATIVE NEGATIVE Final    Comment:        The Xpert SA Assay (FDA approved for NASAL specimens in patients over 66 years of age), is one  component of a comprehensive surveillance program.  Test performance has been validated by [facility] for patients greater than or equal to 85 year old. It is not intended to diagnose infection nor to guide or monitor treatment.      Studies: Ct Abdomen Pelvis W Contrast  03/22/2015   CLINICAL DATA:  Right lower quadrant pain for 6 months.  EXAM: CT ABDOMEN AND PELVIS WITH CONTRAST  TECHNIQUE: Multidetector CT imaging of the abdomen and pelvis was performed using the standard protocol following bolus administration of intravenous contrast.  CONTRAST:  1 OMNIPAQUE IOHEXOL 300 MG/ML SOLN, 171mL OMNIPAQUE IOHEXOL 300 MG/ML SOLN  COMPARISON:  01/08/2012  FINDINGS: BODY WALL: No contributory findings.  LOWER CHEST: No contributory findings.  ABDOMEN/PELVIS:  Liver: Presumed calcified granuloma in the right liver. Punctate density in the anterior segment right liver is too small to characterize but statistically a cyst.  Biliary: Negative  Pancreas:Fatty atrophy without inflammation or duct enlargement.  Spleen: Granulomatous changes.  Adrenals: Unremarkable.  Kidneys and ureters: No hydronephrosis or stone. 1 cm cyst from the lower pole left kidney.  Bladder: Unremarkable.  Reproductive: Left varicocele.  Bowel: Extensive distal colonic diverticulosis without active inflammation. Small mid duodenal diverticulum, also uncomplicated. Normal appendix. No bowel obstruction.  Retroperitoneum: Single chronically enlarged lymph node in the small bowel mesentery on image 35. Even when accounting for 3 mm of growth since 2013, relative stability and isolated nodal enlargement suggest this is an incidental finding. Granulomatous changes present in the portacaval lymph node.  Peritoneum: No ascites or pneumoperitoneum.  Vascular: No acute abnormality.  OSSEOUS: No acute abnormalities.  IMPRESSION: 1. No appendicitis or other explanation for acute right lower quadrant pain. 2. Single enlarged mesenteric lymph node of  doubtful clinical significance given minimal growth since in 2013. 3. Distal colonic diverticulosis.   Electronically Signed   By: Monte Fantasia M.D.   On: 03/22/2015 17:56    Scheduled Meds: . aspirin EC  81 mg Oral Daily  . docusate sodium  200 mg Oral BID  . feeding supplement (ENSURE ENLIVE)  237 mL Oral BID BM  . metoprolol succinate  25 mg Oral Daily  . pantoprazole  40 mg Oral Daily  . polyethylene glycol  17 g Oral BID  . sodium chloride  3 mL Intravenous Q12H  . tamsulosin  0.4 mg Oral Daily   Continuous Infusions: . sodium chloride    . dextrose 5 % and 0.9% NaCl 75 mL/hr at 03/23/15 2323       Time spent: 35 minutes    SINGH,Sean Hammond  [facility] Pager [phone] If 7PM-7AM, please contact night-coverage at [email or web address], password [facility] 03/24/2015, 10:30 AM

## 2015-03-24 NOTE — Progress Notes (Signed)
1 Day Post-Op  Subjective: Still having some nausea and not eating much.  Urinary retention and started on Flomax and foley in place.  Objective: Vital signs in last 24 hours: Temp:  [97.5 F (36.4 C)-98.4 F (36.9 C)] 98.4 F (36.9 C) (05/04 1424) Pulse Rate:  [81-96] 96 (05/04 1428) Resp:  [18-20] 20 (05/04 1424) BP: (109-157)/(62-90) 147/75 mmHg (05/04 1428) SpO2:  [96 %-99 %] 98 % (05/04 1426) Last BM Date: 03/21/15 420 PO this Am Afebrile, VSS, BP up early AM Labs OK, platelets are a little low Intake/Output from previous day: 05/03 0701 - 05/04 0700 In: 1880 [I.V.:1830; IV Piggyback:50] Out: 900 [Urine:900] Intake/Output this shift: Total I/O In: 423 [P.O.:420; I.V.:3] Out: 1400 [Urine:1400]  General appearance: alert, cooperative and no distress GI: still distended some, port sites dressing in place, and they are dry.  Lab Results:   Recent Labs  03/23/15 0500 03/24/15 0453  WBC 4.7 9.1  HGB 14.4 14.1  HCT 42.2 42.1  PLT 162 146*    BMET  Recent Labs  03/23/15 0500 03/24/15 0453  NA 139 140  K 4.0 4.0  CL 107 107  CO2 26 27  GLUCOSE 122* 131*  BUN 14 7  CREATININE 1.08 0.95  CALCIUM 8.4* 8.5*   PT/INR  Recent Labs  03/21/15 1716  LABPROT 14.0  INR 1.06     Recent Labs Lab 03/21/15 1101  AST 28  ALT 24  ALKPHOS 51  BILITOT 0.8  PROT 6.9  ALBUMIN 3.9     Lipase     Component Value Date/Time   LIPASE 17* 03/21/2015 1101     Studies/Results: Ct Abdomen Pelvis W Contrast  03/22/2015   CLINICAL DATA:  Right lower quadrant pain for 6 months.  EXAM: CT ABDOMEN AND PELVIS WITH CONTRAST  TECHNIQUE: Multidetector CT imaging of the abdomen and pelvis was performed using the standard protocol following bolus administration of intravenous contrast.  CONTRAST:  1 OMNIPAQUE IOHEXOL 300 MG/ML SOLN, 143mL OMNIPAQUE IOHEXOL 300 MG/ML SOLN  COMPARISON:  01/08/2012  FINDINGS: BODY WALL: No contributory findings.  LOWER CHEST: No contributory  findings.  ABDOMEN/PELVIS:  Liver: Presumed calcified granuloma in the right liver. Punctate density in the anterior segment right liver is too small to characterize but statistically a cyst.  Biliary: Negative  Pancreas:Fatty atrophy without inflammation or duct enlargement.  Spleen: Granulomatous changes.  Adrenals: Unremarkable.  Kidneys and ureters: No hydronephrosis or stone. 1 cm cyst from the lower pole left kidney.  Bladder: Unremarkable.  Reproductive: Left varicocele.  Bowel: Extensive distal colonic diverticulosis without active inflammation. Small mid duodenal diverticulum, also uncomplicated. Normal appendix. No bowel obstruction.  Retroperitoneum: Single chronically enlarged lymph node in the small bowel mesentery on image 35. Even when accounting for 3 mm of growth since 2013, relative stability and isolated nodal enlargement suggest this is an incidental finding. Granulomatous changes present in the portacaval lymph node.  Peritoneum: No ascites or pneumoperitoneum.  Vascular: No acute abnormality.  OSSEOUS: No acute abnormalities.  IMPRESSION: 1. No appendicitis or other explanation for acute right lower quadrant pain. 2. Single enlarged mesenteric lymph node of doubtful clinical significance given minimal growth since in 2013. 3. Distal colonic diverticulosis.   Electronically Signed   By: Monte Fantasia M.D.   On: 03/22/2015 17:56    Medications: . aspirin EC  81 mg Oral Daily  . docusate sodium  200 mg Oral BID  . feeding supplement (ENSURE ENLIVE)  237 mL Oral BID BM  .  metoprolol succinate  25 mg Oral Daily  . pantoprazole  40 mg Oral Daily  . polyethylene glycol  17 g Oral BID  . sodium chloride  3 mL Intravenous Q12H  . tamsulosin  0.4 mg Oral Daily    Assessment/Plan Cholelithiasis with umbilical hernia LAPAROSCOPIC CHOLECYSTECTOMY, 03/23/15, Dr. Ralene Ok Post op urinary retention Weight loss CAD with prior MI and DES stents/cardiomyopathy/ off Plavix; troponin's are  negative/last EF 44% GERD on PPI Remote tobacco use Dyslipidemia Meat allergy Sleep apnea, does not use CPAP Antibiotics:  None DVT:  SCD/cannot start Heparin or Lovenox due to Meat allergies.   Plan:  Advance diet as tolerated, foley for his urinary retention and starting Flomax.  Mobilize and home when taking PO's well. Saline lock the IV. Resume DVT prophylaxis     Sean Hammond 03/24/2015

## 2015-03-24 NOTE — Progress Notes (Signed)
Pt urinated this AM with no issue. Pt only reporting mild heartburn from lying flat all night. Tums given. Will continue to monitor.

## 2015-03-24 NOTE — Progress Notes (Signed)
    SUBJECTIVE:  "Gas".  No chest pain or SOB   PHYSICAL EXAM Filed Vitals:   03/24/15 0526 03/24/15 1034 03/24/15 1039 03/24/15 1041  BP: 151/73 123/80 119/66 125/90  Pulse: 84 90 82 96  Temp: 98 F (36.7 C) 97.8 F (36.6 C)    TempSrc: Oral Oral    Resp: 18 20    Height:      Weight:      SpO2: 99% 97%     General:  No distress Lungs:  Clear Heart:  RRR  LABS:  Results for orders placed or performed during the hospital encounter of 03/21/15 (from the past 24 hour(s))  Basic metabolic panel     Status: Abnormal   Collection Time: 03/24/15  4:53 AM  Result Value Ref Range   Sodium 140 135 - 145 mmol/L   Potassium 4.0 3.5 - 5.1 mmol/L   Chloride 107 101 - 111 mmol/L   CO2 27 22 - 32 mmol/L   Glucose, Bld 131 (H) 70 - 99 mg/dL   BUN 7 6 - 20 mg/dL   Creatinine, Ser 0.95 0.61 - 1.24 mg/dL   Calcium 8.5 (L) 8.9 - 10.3 mg/dL   GFR calc non Af Amer >60 >60 mL/min   GFR calc Af Amer >60 >60 mL/min   Anion gap 6 5 - 15  CBC     Status: Abnormal   Collection Time: 03/24/15  4:53 AM  Result Value Ref Range   WBC 9.1 4.0 - 10.5 K/uL   RBC 4.58 4.22 - 5.81 MIL/uL   Hemoglobin 14.1 13.0 - 17.0 g/dL   HCT 42.1 39.0 - 52.0 %   MCV 91.9 78.0 - 100.0 fL   MCH 30.8 26.0 - 34.0 pg   MCHC 33.5 30.0 - 36.0 g/dL   RDW 13.5 11.5 - 15.5 %   Platelets 146 (L) 150 - 400 K/uL    Intake/Output Summary (Last 24 hours) at 03/24/15 1243 Last data filed at 03/24/15 1035  Gross per 24 hour  Intake   1003 ml  Output   1600 ml  Net   -597 ml     ASSESSMENT AND PLAN:  CAD:  No evidence of acute cardiac problem with his surgery.  OK to stop telemetry.  (Tele reviewed 03/24/2015)  We will see him as needed.  Please call with questions.   Jeneen Rinks [facility] 03/24/2015 12:43 PM

## 2015-03-24 NOTE — Progress Notes (Signed)
Pt unable to void this am. Bladder scan at 8:30 showed 900cc. MD made aware, ordered to have pt ambulate several times and attempt to void and bladder scan again at 10:00. Pt voided only a small amount. Bladder scan showed 800cc. MD ordered to place Foley catheter. Foley placed with no issues and pt tolerated well. 700cc of clear yellow urine returned immediately. Callie Fielding RN

## 2015-03-24 NOTE — Progress Notes (Signed)
UR completed 

## 2015-03-25 DIAGNOSIS — R55 Syncope and collapse: Secondary | ICD-10-CM | POA: Diagnosis not present

## 2015-03-25 DIAGNOSIS — K8080 Other cholelithiasis without obstruction: Secondary | ICD-10-CM | POA: Diagnosis not present

## 2015-03-25 LAB — BASIC METABOLIC PANEL
Anion gap: 7 (ref 5–15)
BUN: 6 mg/dL (ref 6–20)
CALCIUM: 8.6 mg/dL — AB (ref 8.9–10.3)
CHLORIDE: 104 mmol/L (ref 101–111)
CO2: 27 mmol/L (ref 22–32)
Creatinine, Ser: 0.76 mg/dL (ref 0.61–1.24)
Glucose, Bld: 102 mg/dL — ABNORMAL HIGH (ref 70–99)
Potassium: 3.5 mmol/L (ref 3.5–5.1)
SODIUM: 138 mmol/L (ref 135–145)

## 2015-03-25 LAB — CBC
HCT: 40.2 % (ref 39.0–52.0)
Hemoglobin: 13.5 g/dL (ref 13.0–17.0)
MCH: 30.6 pg (ref 26.0–34.0)
MCHC: 33.6 g/dL (ref 30.0–36.0)
MCV: 91.2 fL (ref 78.0–100.0)
PLATELETS: 132 10*3/uL — AB (ref 150–400)
RBC: 4.41 MIL/uL (ref 4.22–5.81)
RDW: 13.4 % (ref 11.5–15.5)
WBC: 8 10*3/uL (ref 4.0–10.5)

## 2015-03-25 MED ORDER — HYDROCODONE-ACETAMINOPHEN 5-325 MG PO TABS: 1.0000 | ORAL_TABLET | Freq: Four times a day (QID) | ORAL | Status: DC | PRN

## 2015-03-25 MED ORDER — ENSURE ENLIVE PO LIQD: 237.0000 mL | Freq: Two times a day (BID) | ORAL | Status: DC

## 2015-03-25 MED ORDER — TAMSULOSIN HCL 0.4 MG PO CAPS: 0.4000 mg | ORAL_CAPSULE | Freq: Every day | ORAL | Status: DC

## 2015-03-25 NOTE — Discharge Instructions (Signed)
Follow with Primary MD GATES,ROBERT NEVILL, MD in 7 days   Get CBC, CMP, 2 view Chest X ray checked  by Primary MD next visit.    Activity: As tolerated with Full fall precautions use walker/cane & assistance as needed   Disposition Home    Diet: Heart Healthy   For Heart failure patients - Check your Weight same time everyday, if you gain over 2 pounds, or you develop in leg swelling, experience more shortness of breath or chest pain, call your Primary MD immediately. Follow Cardiac Low Salt Diet and 1.5 lit/day fluid restriction.   On your next visit with your primary care physician please Get Medicines reviewed and adjusted.   Please request your Prim.MD to go over all Hospital Tests and Procedure/Radiological results at the follow up, please get all Hospital records sent to your Prim MD by signing hospital release before you go home.   If you experience worsening of your admission symptoms, develop shortness of breath, life threatening emergency, suicidal or homicidal thoughts you must seek medical attention immediately by calling 911 or calling your MD immediately  if symptoms less severe.  You Must read complete instructions/literature along with all the possible adverse reactions/side effects for all the Medicines you take and that have been prescribed to you. Take any new Medicines after you have completely understood and accpet all the possible adverse reactions/side effects.   Do not drive, operating heavy machinery, perform activities at heights, swimming or participation in water activities or provide baby sitting services if your were admitted for syncope or siezures until you have seen by Primary MD or a Neurologist and advised to do so again.  Do not drive when taking Pain medications.    Do not take more than prescribed Pain, Sleep and Anxiety Medications  Special Instructions: If you have smoked or chewed Tobacco  in the last 2 yrs please stop smoking, stop any  regular Alcohol  and or any Recreational drug use.  Wear Seat belts while driving.   Please note  You were cared for by a hospitalist during your hospital stay. If you have any questions about your discharge medications or the care you received while you were in the hospital after you are discharged, you can call the unit and asked to speak with the hospitalist on call if the hospitalist that took care of you is not available. Once you are discharged, your primary care physician will handle any further medical issues. Please note that NO REFILLS for any discharge medications will be authorized once you are discharged, as it is imperative that you return to your primary care physician (or establish a relationship with a primary care physician if you do not have one) for your aftercare needs so that they can reassess your need for medications and monitor your lab values.      Laparoscopic Cholecystectomy, Care After Refer to this sheet in the next few weeks. These instructions provide you with information on caring for yourself after your procedure. Your health care provider may also give you more specific instructions. Your treatment has been planned according to current medical practices, but problems sometimes occur. Call your health care provider if you have any problems or questions after your procedure. WHAT TO EXPECT AFTER THE PROCEDURE After your procedure, it is typical to have the following:  Pain at your incision sites. You will be given pain medicines to control the pain.  Mild nausea or vomiting. This should improve after the first 24  hours.  Bloating and possibly shoulder pain from the gas used during the procedure. This will improve after the first 24 hours. HOME CARE INSTRUCTIONS   Change bandages (dressings) as directed by your health care provider.  Keep the wound dry and clean. You may wash the wound gently with soap and water. Gently blot or dab the area dry.  Do not  take baths or use swimming pools or hot tubs for 2 weeks or until your health care provider approves.  Only take over-the-counter or prescription medicines as directed by your health care provider.  Continue your normal diet as directed by your health care provider.  Do not lift anything heavier than 10 pounds (4.5 kg) until your health care provider approves.  Do not play contact sports for 1 week or until your health care provider approves. SEEK MEDICAL CARE IF:   You have redness, swelling, or increasing pain in the wound.  You notice yellowish-white fluid (pus) coming from the wound.  You have drainage from the wound that lasts longer than 1 day.  You notice a bad smell coming from the wound or dressing.  Your surgical cuts (incisions) break open. SEEK IMMEDIATE MEDICAL CARE IF:   You develop a rash.  You have difficulty breathing.  You have chest pain.  You have a fever.  You have increasing pain in the shoulders (shoulder strap areas).  You have dizzy episodes or faint while standing.  You have severe abdominal pain.  You feel sick to your stomach (nauseous) or throw up (vomit) and this lasts for more than 1 day. Document Released: 11/06/2005 Document Revised: 08/27/2013 Document Reviewed: 06/18/2013 [facility] Patient Information 2015 [place], [place]. This information is not intended to replace advice given to you by your health care provider. Make sure you discuss any questions you have with your health care provider.  [facility] ______[facility] LAPAROSCOPIC SURGERY: POST OP INSTRUCTIONS Always review your discharge instruction sheet given to you by the facility where your surgery was performed. IF YOU HAVE DISABILITY OR FAMILY LEAVE FORMS, YOU MUST BRING THEM TO THE OFFICE FOR PROCESSING.   DO NOT GIVE THEM TO YOUR DOCTOR.  1. A prescription for pain medication may be given to you upon discharge.  Take your pain medication as prescribed, if needed.   If narcotic pain medicine is not needed, then you may take acetaminophen (Tylenol) or ibuprofen (Advil) as needed. 2. Take your usually prescribed medications unless otherwise directed. 3. If you need a refill on your pain medication, please contact your pharmacy.  They will contact our office to request authorization. Prescriptions will not be filled after 5pm or on week-ends. 4. You should follow a light diet the first few days after arrival home, such as soup and crackers, etc.  Be sure to include lots of fluids daily. 5. Most patients will experience some swelling and bruising in the area of the incisions.  Ice packs will help.  Swelling and bruising can take several days to resolve.  6. It is common to experience some constipation if taking pain medication after surgery.  Increasing fluid intake and taking a stool softener (such as Colace) will usually help or prevent this problem from occurring.  A mild laxative (Milk of Magnesia or Miralax) should be taken according to package instructions if there are no bowel movements after 48 hours. 7. Unless discharge instructions indicate otherwise, you may remove your bandages 24-48 hours after surgery, and you may shower at that time.  You  may have steri-strips (small skin tapes) in place directly over the incision.  These strips should be left on the skin for 7-10 days.  If your surgeon used skin glue on the incision, you may shower in 24 hours.  The glue will flake off over the next 2-3 weeks.  Any sutures or staples will be removed at the office during your follow-up visit. 8. ACTIVITIES:  You may resume regular (light) daily activities beginning the next day--such as daily self-care, walking, climbing stairs--gradually increasing activities as tolerated.  You may have sexual intercourse when it is comfortable.  Refrain from any heavy lifting or straining until approved by your doctor. a. You may drive when you are no longer taking prescription pain  medication, you can comfortably wear a seatbelt, and you can safely maneuver your car and apply brakes. b. RETURN TO WORK:  __________________________________________________________ 9. You should see your doctor in the office for a follow-up appointment approximately 2-3 weeks after your surgery.  Make sure that you call for this appointment within a day or two after you arrive home to insure a convenient appointment time. 10. OTHER INSTRUCTIONS: __________________________________________________________________________________________________________________________ __________________________________________________________________________________________________________________________ WHEN TO CALL YOUR DOCTOR: 1. Fever over 101.0 2. Inability to urinate 3. Continued bleeding from incision. 4. Increased pain, redness, or drainage from the incision. 5. Increasing abdominal pain  The clinic staff is available to answer your questions during regular business hours.  Please dont hesitate to call and ask to speak to one of the nurses for clinical concerns.  If you have a medical emergency, go to the nearest emergency room or call 911.  A surgeon from [facility] is always on call at the hospital. [address], [place], [place], [place]  [address] ? [address], [place], [place]   [address] [phone] ? [phone] ? FAX [phone] Web site: [email or web address]

## 2015-03-25 NOTE — Progress Notes (Signed)
Discharge instructions and foley care teaching done by Rolin Barry, RN patient discharged to home.

## 2015-03-25 NOTE — Progress Notes (Signed)
2 Days Post-Op  Subjective: He had [place] toast for breakfast.  Port sites look fine.  He is for discharge later today with foley for urinary retention. Objective: Vital signs in last 24 hours: Temp:  [97.8 F (36.6 C)-98.6 F (37 C)] 97.9 F (36.6 C) (05/05 0619) Pulse Rate:  [80-99] 83 (05/05 0808) Resp:  [20] 20 (05/05 0619) BP: (119-157)/(66-90) 144/75 mmHg (05/05 0808) SpO2:  [96 %-98 %] 96 % (05/05 0619) Last BM Date: 03/24/15 420 PO recorded 2125 urine recorded Afebrile, VSS Labs OK Cardiac diet Intake/Output from previous day: 05/04 0701 - 05/05 0700 In: 1023 [P.O.:420; I.V.:603] Out: 2125 [Urine:2125] Intake/Output this shift: Total I/O In: -  Out: 1100 [Urine:1100]  General appearance: alert, cooperative and no distress GI: soft sore, taking PO's, not complaining of nausea this AM.  Port sites all look fine.  Lab Results:   Recent Labs  03/24/15 0453 03/25/15 0537  WBC 9.1 8.0  HGB 14.1 13.5  HCT 42.1 40.2  PLT 146* 132*    BMET  Recent Labs  03/24/15 0453 03/25/15 0537  NA 140 138  K 4.0 3.5  CL 107 104  CO2 27 27  GLUCOSE 131* 102*  BUN 7 6  CREATININE 0.95 0.76  CALCIUM 8.5* 8.6*   PT/INR No results for input(s): LABPROT, INR in the last 72 hours.   Recent Labs Lab 03/21/15 1101  AST 28  ALT 24  ALKPHOS 51  BILITOT 0.8  PROT 6.9  ALBUMIN 3.9     Lipase     Component Value Date/Time   LIPASE 17* 03/21/2015 1101     Studies/Results: No results found.  Medications: . aspirin EC  81 mg Oral Daily  . docusate sodium  200 mg Oral BID  . feeding supplement (ENSURE ENLIVE)  237 mL Oral BID BM  . metoprolol succinate  25 mg Oral Daily  . pantoprazole  40 mg Oral Daily  . polyethylene glycol  17 g Oral BID  . sodium chloride  3 mL Intravenous Q12H  . tamsulosin  0.4 mg Oral Daily    Assessment/Plan Cholelithiasis with umbilical hernia LAPAROSCOPIC CHOLECYSTECTOMY, 03/23/15, Dr. Ralene Ok Post op urinary  retention Weight loss CAD with prior MI and DES stents/cardiomyopathy/ off Plavix; troponin's are negative/last EF 44% GERD on PPI Remote tobacco use Dyslipidemia Meat allergy Sleep apnea, does not use CPAP Antibiotics: None DVT: SCD/cannot start Heparin or Lovenox due to Meat allergies.    Plan:  For discharge, follow up instructions in AVS.      Sean Hammond 03/25/2015

## 2015-03-25 NOTE — Discharge Summary (Signed)
Sean Hammond, is a 74 y.o. male  DOB October 17, 1941  MRN [id number].  Admission date:  03/21/2015  Admitting Physician  Verlee Monte, MD  Discharge Date:  03/25/2015   Primary MD  Henrine Screws, MD  Recommendations for primary care physician for things to follow:   Check CBC, BMP in a week. Must follow with urology and general surgery closely   Admission Diagnosis  Near syncope [R55]   Discharge Diagnosis  Near syncope [R55]    Principal Problem:   Near syncope Active Problems:   Shortness of breath on exertion   CAD in native artery   RUQ abdominal pain      Past Medical History  Diagnosis Date  . Silent myocardial infarction   . Shortness of breath on exertion   . GERD (gastroesophageal reflux disease)   . Chronic headaches     "started  09/2011"  . Skin cancer     head, ear, back  . CAD (coronary artery disease)   . Hyperlipidemia   . Leukopenia   . ED (erectile dysfunction)   . Allergic rhinitis   . Insomnia     , post  . Epigastric abdominal pain   . Epicondylitis   . Prostatitis   . Epistaxis   . Osteoarthritis of hand   . Sinusitis   . Lung nodule     , Left upper  . Moderate obstructive sleep apnea     Cpap of 9cm of H2O - 05/2012    Past Surgical History  Procedure Laterality Date  . Coronary angioplasty with stent placement  12/14/11    "2"  . Knee arthroscopy  ` 2010    left  . Bone cyst excision  ~ 2004    left knee  . Nasal sinus surgery  ~ 2002 & 2003  . Eye surgery  ~ 2010    "both eyes; reshape pupil"  . Skin cancer excision      "back; head; ears"  . Left heart catheterization with coronary angiogram N/A 12/14/2011    Procedure: LEFT HEART CATHETERIZATION WITH CORONARY ANGIOGRAM;  Surgeon: Candee Furbish, MD;  Location: [facility] CATH LAB;  Service: Cardiovascular;   Laterality: N/A;  . Percutaneous coronary stent intervention (pci-s)  12/14/2011    Procedure: PERCUTANEOUS CORONARY STENT INTERVENTION (PCI-S);  Surgeon: Candee Furbish, MD;  Location: [facility] CATH LAB;  Service: Cardiovascular;;  . Cholecystectomy N/A 03/23/2015    Procedure: LAPAROSCOPIC CHOLECYSTECTOMY;  Surgeon: Ralene Ok, MD;  Location: [facility] ORS;  Service: General;  Laterality: N/A;       History of present illness and  Hospital Course:     Kindly see H&P for history of present illness and admission details, please review complete Labs, Consult reports and Test reports for all details in brief  HPI  from the history and physical done on the day of admission  Sean Hammond is a 74 y.o. male with past medial history of CAD (silent myocardial infarction) and stent to the circumflex placed in 2013, patient came  to the hospital with presyncope. Patient said for the past 2 days did have occasional epigastric to RUQ abdominal pain, he also felt nauseous with it, earlier today he went to church he was very sweaty, and felt he will pass out. Patient brought to the hospital for further evaluation. His symptoms were very challenging, he had the symptoms going on off for the past 6 months, was seen by general surgery and recommended cholecystectomy by the end of May. In the ED first set of cardiac enzymes is negative, EKG showed normal sinus rhythm with no evidence of ischemia, abdominal ultrasound showed no cholelithiasis, negative Murphy's sign  Hospital Course    Presyncope likely due to vagal stimulation from pain Presented with lightheadedness, diaphoresis and presyncope. Ruled out ACS, -ve 3 sets of cardiac enzymes and stable repeat EKG  Telemetry reviewed, no arrhythmias, 2-D echo is stable and EF of 55% and no wall motion abnormalities. Stable telemetry. Completely symptom-free, No further evaluation, this is likely secondary to the pain from RUQ is an increased vagal tone.   RUQ abdominal  pain Pain is going on for some time (about 6 months) for seen by general surgery underwent laparoscopic cholecystectomy on 03/23/2015 Stable for now.   CAD History of stent in the circumflex artery in 2013, patient started complaining about exertional dyspnea/pain. Cardiology evaluated the patient, no further evaluation.   Post op bladder outlet obstruction.  Straight cath 2 still has 900 mL residual. He required Foley catheter placement and was placed on Flomax, outpatient urology follow-up.     Discharge Condition: Stable   Follow UP  Follow-up Information    Follow up with [facility] On 04/06/2015.   Why:  Your appointment is at 3:35, be there 30 minutes early for check in.   Contact information:   [place] [phone]      Follow up with Henrine Screws, MD. Schedule an appointment as soon as possible for a visit in 1 week.   Specialty:  Internal Medicine   Contact information:   [address] [place] [address] [place] [address] [phone]       Follow up with [facility], MATTHEW, MD. Schedule an appointment as soon as possible for a visit in 1 week.   Specialty:  Urology   Why:  Bladder outlet obstruction   Contact information:   [place] [address] [phone]       Follow up with GATES,ROBERT NEVILL, MD. Schedule an appointment as soon as possible for a visit in 1 week.   Specialty:  Internal Medicine   Contact information:   [address] [place] [address] [phone]       Follow up with [facility]. Schedule an appointment as soon as possible for a visit in 1 week.   Contact information:   [place] [id number] [phone]        Discharge Instructions  and  Discharge Medications      Discharge Instructions    Diet - low sodium heart healthy    Complete by:  As directed       Discharge instructions    Complete by:  As directed   Follow with Primary MD GATES,ROBERT NEVILL, MD in 7 days   Get CBC, CMP, 2 view Chest X ray checked  by Primary MD next visit.    Activity: As tolerated with Full fall  precautions use walker/cane & assistance as needed   Disposition Home    Diet: Heart Healthy   For Heart failure patients - Check your Weight same time everyday, if you gain over 2 pounds, or you develop in leg swelling, experience more shortness of breath or chest pain, call your Primary MD immediately. Follow Cardiac Low Salt Diet and 1.5 lit/day fluid restriction.   On your next visit with your primary care physician please Get Medicines reviewed and adjusted.   Please request your Prim.MD to go over all Hospital Tests and Procedure/Radiological results at the follow up, please get all Hospital records sent to your Prim MD by signing hospital release before you go home.   If you experience worsening of your admission symptoms, develop shortness of breath, life threatening emergency, suicidal or homicidal thoughts you must seek medical attention immediately by calling 911 or calling your MD immediately  if symptoms less severe.  You Must read complete instructions/literature along with all the possible adverse reactions/side effects for all the Medicines you take and that have been prescribed to you. Take any new Medicines after you have completely understood and accpet all the possible adverse reactions/side effects.   Do not drive, operating heavy machinery, perform activities at heights, swimming or participation in water activities or provide baby sitting services if your were admitted for syncope or siezures until you have seen by Primary MD or a Neurologist and advised to do so again.  Do not drive when taking Pain medications.    Do not take more than prescribed Pain, Sleep and Anxiety Medications  Special Instructions: If you have smoked or chewed Tobacco   in the last 2 yrs please stop smoking, stop any regular Alcohol  and or any Recreational drug use.  Wear Seat belts while driving.   Please note  You were cared for by a hospitalist during your hospital stay. If you have any questions about your discharge medications or the care you received while you were in the hospital after you are discharged, you can call the unit and asked to speak with the hospitalist on call if the hospitalist that took care of you is not available. Once you are discharged, your primary care physician will handle any further medical issues. Please note that NO REFILLS for any discharge medications will be authorized once you are discharged, as it is imperative that you return to your primary care physician (or establish a relationship with a primary care physician if you do not have one) for your aftercare needs so that they can reassess your need for medications and monitor your lab values.     Increase activity slowly    Complete by:  As directed             Medication List    TAKE these medications        aspirin 81 MG tablet  Take 81 mg by mouth daily.     diphenhydrAMINE 25 MG tablet  Commonly known as:  BENADRYL  Take 25 mg by mouth as needed.     EPIPEN 2-PAK 0.3 mg/0.3 mL Soaj injection  Generic drug:  EPINEPHrine  as needed.     feeding supplement (ENSURE ENLIVE) Liqd  Take 237 mLs by mouth 2 (two) times daily between meals.     HYDROcodone-acetaminophen 5-325 MG per tablet  Commonly known as:  NORCO/VICODIN  Take 1 tablet by mouth every 6 (six) hours as needed for moderate pain.     metoprolol succinate  25 MG 24 hr tablet  Commonly known as:  TOPROL XL  Take 1 tablet (25 mg total) by mouth daily.     multivitamin capsule  Take 1 capsule by mouth daily.     NITROSTAT 0.4 MG SL tablet  Generic drug:  nitroGLYCERIN  PLACE 1 TABLET BY MOUTH UNDER THE TOUNGE EVERY 5 MINUTES AS NEEDED FOR CHEST PAIN     pantoprazole 40 MG tablet  Commonly  known as:  PROTONIX  TAKE 1 TABLET BY MOUTH DAILY AT 12 NOON *DRUG NOT COVERED BY INSURANCE*     Pitavastatin Calcium 2 MG Tabs  Take one by mouth twice a week     tamsulosin 0.4 MG Caps capsule  Commonly known as:  FLOMAX  Take 1 capsule (0.4 mg total) by mouth daily.     traMADol 50 MG tablet  Commonly known as:  ULTRAM  Take 50 mg by mouth daily.     VITAMIN D HIGH POTENCY 1000 UNITS capsule  Generic drug:  Cholecalciferol  Take 1,000 Units by mouth daily.          Diet and Activity recommendation: See Discharge Instructions above   Consults obtained - [facility]   Major procedures and Radiology Reports - PLEASE review detailed and final reports for all details, in brief -     Laparoscopic cholecystectomy 03/23/2015  Echogram. Showing EF of 52% with no wall motion abnormalities   Dg Chest 2 View  03/21/2015   CLINICAL DATA:  Right-sided chest pain radiating to the upper abdomen. Scheduled cholecystectomy this month. Feels like gallbladder pain. Diaphoretic. History of smoking.  EXAM: CHEST  2 VIEW  COMPARISON:  01/08/2013  FINDINGS: Lungs are mildly hyperinflated. No focal consolidations or pleural effusions. No pulmonary edema. Mild mid thoracic spondylosis.  IMPRESSION: No evidence for acute abnormality. Mild hyperinflation. Abnormality.   Electronically Signed   By: Nolon Nations M.D.   On: 03/21/2015 12:07   Ct Abdomen Pelvis W Contrast  03/22/2015   CLINICAL DATA:  Right lower quadrant pain for 6 months.  EXAM: CT ABDOMEN AND PELVIS WITH CONTRAST  TECHNIQUE: Multidetector CT imaging of the abdomen and pelvis was performed using the standard protocol following bolus administration of intravenous contrast.  CONTRAST:  1 OMNIPAQUE IOHEXOL 300 MG/ML SOLN, 122mL OMNIPAQUE IOHEXOL 300 MG/ML SOLN  COMPARISON:  01/08/2012  FINDINGS: BODY WALL: No contributory findings.  LOWER CHEST: No contributory findings.  ABDOMEN/PELVIS:  Liver: Presumed calcified granuloma in the right liver.  Punctate density in the anterior segment right liver is too small to characterize but statistically a cyst.  Biliary: Negative  Pancreas:Fatty atrophy without inflammation or duct enlargement.  Spleen: Granulomatous changes.  Adrenals: Unremarkable.  Kidneys and ureters: No hydronephrosis or stone. 1 cm cyst from the lower pole left kidney.  Bladder: Unremarkable.  Reproductive: Left varicocele.  Bowel: Extensive distal colonic diverticulosis without active inflammation. Small mid duodenal diverticulum, also uncomplicated. Normal appendix. No bowel obstruction.  Retroperitoneum: Single chronically enlarged lymph node in the small bowel mesentery on image 35. Even when accounting for 3 mm of growth since 2013, relative stability and isolated nodal enlargement suggest this is an incidental finding. Granulomatous changes present in the portacaval lymph node.  Peritoneum: No ascites or pneumoperitoneum.  Vascular: No acute abnormality.  OSSEOUS: No acute abnormalities.  IMPRESSION: 1. No appendicitis or other explanation for acute right lower quadrant pain. 2. Single enlarged mesenteric lymph node of doubtful clinical significance given minimal growth since in 2013. 3. Distal colonic diverticulosis.  Electronically Signed   By: Monte Fantasia M.D.   On: 03/22/2015 17:56   US Abdomen Limited  03/21/2015   CLINICAL DATA:  Three-day history of upper abdominal pain  EXAM: US ABDOMEN LIMITED - RIGHT UPPER QUADRANT  COMPARISON:  December 29, 2014  FINDINGS: Gallbladder:  No gallstones or wall thickening visualized. There is no pericholecystic fluid. No sonographic Murphy sign noted.  Common bile duct:  Diameter: 4 mm. There is no intrahepatic or extrahepatic biliary duct dilatation.  Liver:  No focal lesion identified. Within normal limits in parenchymal echogenicity.  IMPRESSION: Study within normal limits.   Electronically Signed   By: Lowella Grip III M.D.   On: 03/21/2015 13:31    Micro Results      Recent  Results (from the past 240 hour(s))  Surgical pcr screen     Status: None   Collection Time: 03/22/15 11:17 PM  Result Value Ref Range Status   MRSA, PCR NEGATIVE NEGATIVE Final   Staphylococcus aureus NEGATIVE NEGATIVE Final    Comment:        The Xpert SA Assay (FDA approved for NASAL specimens in patients over 50 years of age), is one component of a comprehensive surveillance program.  Test performance has been validated by [facility] for patients greater than or equal to 48 year old. It is not intended to diagnose infection nor to guide or monitor treatment.        Today   Subjective:   Bernerd Limbo today has no headache,no chest abdominal pain,no new weakness tingling or numbness, feels much better wants to go home today.    Objective:   Blood pressure 144/75, pulse 83, temperature 97.9 F (36.6 C), temperature source Oral, resp. rate 20, height 6\' 1"  (1.854 m), weight 82.2 kg (181 lb 3.5 oz), SpO2 96 %.   Intake/Output Summary (Last 24 hours) at 03/25/15 0839 Last data filed at 03/25/15 0733  Gross per 24 hour  Intake   1023 ml  Output   3225 ml  Net  -2202 ml    Exam Awake Alert, Oriented x 3, No new F.N deficits, Normal affect [place].AT,PERRAL Supple Neck,No JVD, No cervical lymphadenopathy appriciated.  Symmetrical Chest wall movement, Good air movement bilaterally, CTAB RRR,No Gallops,Rubs or new Murmurs, No Parasternal Heave +ve B.Sounds, Abd Soft, Non tender, No organomegaly appriciated, No rebound -guarding or rigidity. No Cyanosis, Clubbing or edema, No new Rash or bruise, Foley catheter in place  Data Review   CBC w Diff: Lab Results  Component Value Date   WBC 8.0 03/25/2015   HGB 13.5 03/25/2015   HCT 40.2 03/25/2015   PLT 132* 03/25/2015   LYMPHOPCT 28 12/29/2014   MONOPCT 8 12/29/2014   EOSPCT 4 12/29/2014   BASOPCT 1 12/29/2014    CMP: Lab Results  Component Value Date   NA 138 03/25/2015   K 3.5 03/25/2015   CL 104 03/25/2015    CO2 27 03/25/2015   BUN 6 03/25/2015   CREATININE 0.76 03/25/2015   PROT 6.9 03/21/2015   ALBUMIN 3.9 03/21/2015   BILITOT 0.8 03/21/2015   ALKPHOS 51 03/21/2015   AST 28 03/21/2015   ALT 24 03/21/2015  .   Total Time in preparing paper work, data evaluation and todays exam - 35 minutes  Thurnell Lose M.D on 03/25/2015 at 8:39 AM  [facility]   Office  [phone]

## 2015-03-30 DIAGNOSIS — Q6100 Congenital renal cyst, unspecified: Secondary | ICD-10-CM | POA: Diagnosis not present

## 2015-03-30 DIAGNOSIS — R339 Retention of urine, unspecified: Secondary | ICD-10-CM | POA: Diagnosis not present

## 2015-03-30 DIAGNOSIS — Z125 Encounter for screening for malignant neoplasm of prostate: Secondary | ICD-10-CM | POA: Diagnosis not present

## 2015-04-02 DIAGNOSIS — R109 Unspecified abdominal pain: Secondary | ICD-10-CM | POA: Diagnosis not present

## 2015-04-09 DIAGNOSIS — M25561 Pain in right knee: Secondary | ICD-10-CM | POA: Diagnosis not present

## 2015-04-09 DIAGNOSIS — M25562 Pain in left knee: Secondary | ICD-10-CM | POA: Diagnosis not present

## 2015-04-09 DIAGNOSIS — M170 Bilateral primary osteoarthritis of knee: Secondary | ICD-10-CM | POA: Diagnosis not present

## 2015-04-15 ENCOUNTER — Ambulatory Visit (HOSPITAL_COMMUNITY): Admission: RE | Admit: 2015-04-15 | Payer: Medicare Other | Source: Ambulatory Visit | Admitting: General Surgery

## 2015-04-15 ENCOUNTER — Encounter (HOSPITAL_COMMUNITY): Admission: RE | Payer: Self-pay | Source: Ambulatory Visit

## 2015-04-15 SURGERY — LAPAROSCOPIC CHOLECYSTECTOMY WITH INTRAOPERATIVE CHOLANGIOGRAM
Anesthesia: General

## 2015-04-28 ENCOUNTER — Other Ambulatory Visit: Payer: Self-pay | Admitting: Cardiology

## 2015-05-06 DIAGNOSIS — J0100 Acute maxillary sinusitis, unspecified: Secondary | ICD-10-CM | POA: Diagnosis not present

## 2015-05-06 DIAGNOSIS — J029 Acute pharyngitis, unspecified: Secondary | ICD-10-CM | POA: Diagnosis not present

## 2015-05-11 DIAGNOSIS — M1712 Unilateral primary osteoarthritis, left knee: Secondary | ICD-10-CM | POA: Diagnosis not present

## 2015-05-11 DIAGNOSIS — M1711 Unilateral primary osteoarthritis, right knee: Secondary | ICD-10-CM | POA: Diagnosis not present

## 2015-05-17 ENCOUNTER — Other Ambulatory Visit: Payer: Self-pay

## 2015-05-18 DIAGNOSIS — M1712 Unilateral primary osteoarthritis, left knee: Secondary | ICD-10-CM | POA: Diagnosis not present

## 2015-05-18 DIAGNOSIS — M1711 Unilateral primary osteoarthritis, right knee: Secondary | ICD-10-CM | POA: Diagnosis not present

## 2015-05-18 DIAGNOSIS — M170 Bilateral primary osteoarthritis of knee: Secondary | ICD-10-CM | POA: Diagnosis not present

## 2015-05-25 DIAGNOSIS — M1711 Unilateral primary osteoarthritis, right knee: Secondary | ICD-10-CM | POA: Diagnosis not present

## 2015-05-25 DIAGNOSIS — M1712 Unilateral primary osteoarthritis, left knee: Secondary | ICD-10-CM | POA: Diagnosis not present

## 2015-05-25 DIAGNOSIS — M170 Bilateral primary osteoarthritis of knee: Secondary | ICD-10-CM | POA: Diagnosis not present

## 2015-07-02 DIAGNOSIS — G609 Hereditary and idiopathic neuropathy, unspecified: Secondary | ICD-10-CM | POA: Diagnosis not present

## 2015-07-02 DIAGNOSIS — M170 Bilateral primary osteoarthritis of knee: Secondary | ICD-10-CM | POA: Diagnosis not present

## 2015-07-02 DIAGNOSIS — E782 Mixed hyperlipidemia: Secondary | ICD-10-CM | POA: Diagnosis not present

## 2015-07-02 DIAGNOSIS — I2510 Atherosclerotic heart disease of native coronary artery without angina pectoris: Secondary | ICD-10-CM | POA: Diagnosis not present

## 2015-07-02 DIAGNOSIS — G4733 Obstructive sleep apnea (adult) (pediatric): Secondary | ICD-10-CM | POA: Diagnosis not present

## 2015-07-02 DIAGNOSIS — E559 Vitamin D deficiency, unspecified: Secondary | ICD-10-CM | POA: Diagnosis not present

## 2015-07-02 DIAGNOSIS — R972 Elevated prostate specific antigen [PSA]: Secondary | ICD-10-CM | POA: Diagnosis not present

## 2015-07-02 DIAGNOSIS — K219 Gastro-esophageal reflux disease without esophagitis: Secondary | ICD-10-CM | POA: Diagnosis not present

## 2015-07-02 DIAGNOSIS — G4701 Insomnia due to medical condition: Secondary | ICD-10-CM | POA: Diagnosis not present

## 2015-07-08 ENCOUNTER — Ambulatory Visit: Payer: BLUE CROSS/BLUE SHIELD | Admitting: Cardiology

## 2015-07-09 DIAGNOSIS — M47812 Spondylosis without myelopathy or radiculopathy, cervical region: Secondary | ICD-10-CM | POA: Diagnosis not present

## 2015-07-12 ENCOUNTER — Ambulatory Visit (INDEPENDENT_AMBULATORY_CARE_PROVIDER_SITE_OTHER): Payer: Medicare Other | Admitting: Cardiology

## 2015-07-12 ENCOUNTER — Encounter: Payer: Self-pay | Admitting: Cardiology

## 2015-07-12 VITALS — BP 142/78 | HR 72 | Ht 73.0 in | Wt 204.0 lb

## 2015-07-12 DIAGNOSIS — I2510 Atherosclerotic heart disease of native coronary artery without angina pectoris: Secondary | ICD-10-CM

## 2015-07-12 DIAGNOSIS — I208 Other forms of angina pectoris: Secondary | ICD-10-CM

## 2015-07-12 DIAGNOSIS — I252 Old myocardial infarction: Secondary | ICD-10-CM

## 2015-07-12 DIAGNOSIS — E785 Hyperlipidemia, unspecified: Secondary | ICD-10-CM

## 2015-07-12 NOTE — Progress Notes (Signed)
[facility] [address] [place], [place]  [address] Phone: [phone] Fax:  [phone]  Date:  07/12/2015   ID:  Sean Hammond, DOB 08-May-1941, MRN [id number]  PCP:  Henrine Screws, MD   History of Present Illness: Sean Hammond is a 74 y.o. male with coronary artery disease status post overlapping drug eluting stents to his mid circumflex with prior ejection fraction of 44%. These were placed in January of 2013. CPAP for OSA. Prior nuclear stress test showed base to mid inferior wall ischemia in 2013. This was prior to stent placement in the circumflex artery.  Finally had gall bladder removed.   Had syncope vagal May 2016,.  Previously felt some chest pain with exertion as well as rest, sometimes when laying down, moderate in intensity resulting in a few minutes. Previously was offered beta blocker or other antianginal medications. He did not want to utilize these at prior visit. Today, he is willing to try these medications. He has stopped Crestor because of leg pains, flank pain. He thinks that his leg discomfort is getting better. He has seen Dr. Gladstone Lighter in the past who has also worked up gallbladder disease, normal.   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 Not significant. No associated arm pain or shoulder pain. Ultimately, it is very hard for him to decide whether or not he is actually having true anginal symptoms.  Could not tolerate Crestor. Hurt so bad. Livalo also had issues with. He will try twice a week dose. Unfortunately, he has not been taking this as directed.  Enjoying hunting. He is now focusing on his gallbladder issue. In February 2016 he ended up going to the emergency room. He is now seeing general surgery.  His wife is worried about him having a heart attack.    Wt Readings from Last 3  Encounters:  07/12/15 204 lb (92.534 kg)  03/23/15 181 lb 3.5 oz (82.2 kg)  01/07/15 197 lb (89.359 kg)     Past Medical History  Diagnosis Date  . Silent myocardial infarction   . Shortness of breath on exertion   . GERD (gastroesophageal reflux disease)   . Chronic headaches     "started  09/2011"  . Skin cancer     head, ear, back  . CAD (coronary artery disease)   . Hyperlipidemia   . Leukopenia   . ED (erectile dysfunction)   . Allergic rhinitis   . Insomnia     , post  . Epigastric abdominal pain   . Epicondylitis   . Prostatitis   . Epistaxis   . Osteoarthritis of hand   . Sinusitis   . Lung nodule     , Left upper  . Moderate obstructive sleep apnea     Cpap of 9cm of H2O - 05/2012    Past Surgical History  Procedure Laterality Date  . Coronary angioplasty with stent placement  12/14/11    "2"  . Knee arthroscopy  ` 2010    left  . Bone cyst excision  ~ 2004    left knee  . Nasal sinus surgery  ~ 2002 & 2003  . Eye surgery  ~ 2010    "both eyes; reshape pupil"  . Skin cancer excision      "back; head; ears"  .  Left heart catheterization with coronary angiogram N/A 12/14/2011    Procedure: LEFT HEART CATHETERIZATION WITH CORONARY ANGIOGRAM;  Surgeon: Candee Furbish, MD;  Location: [facility] CATH LAB;  Service: Cardiovascular;  Laterality: N/A;  . Percutaneous coronary stent intervention (pci-s)  12/14/2011    Procedure: PERCUTANEOUS CORONARY STENT INTERVENTION (PCI-S);  Surgeon: Candee Furbish, MD;  Location: [facility] CATH LAB;  Service: Cardiovascular;;  . Cholecystectomy N/A 03/23/2015    Procedure: LAPAROSCOPIC CHOLECYSTECTOMY;  Surgeon: Ralene Ok, MD;  Location: [facility] ORS;  Service: General;  Laterality: N/A;    Current Outpatient Prescriptions  Medication Sig Dispense Refill  . aspirin 81 MG tablet Take 81 mg by mouth daily.    . Cholecalciferol (VITAMIN D HIGH POTENCY) 1000 UNITS capsule Take 1,000 Units by mouth daily.    . diphenhydrAMINE (BENADRYL) 25 MG tablet  Take 25 mg by mouth as needed.    Marland Kitchen EPIPEN 2-PAK 0.3 MG/0.3ML SOAJ injection as needed.    . feeding supplement (BOOST HIGH PROTEIN) LIQD Take 1 Container by mouth 2 (two) times a week.    . metoprolol succinate (TOPROL XL) 25 MG 24 hr tablet Take 1 tablet (25 mg total) by mouth daily. 30 tablet 11  . Multiple Vitamin (MULTIVITAMIN) capsule Take 1 capsule by mouth daily.    Marland Kitchen NITROSTAT 0.4 MG SL tablet PLACE 1 TABLET BY MOUTH UNDER THE TOUNGE EVERY 5 MINUTES AS NEEDED FOR CHEST PAIN 25 tablet 6  . pantoprazole (PROTONIX) 40 MG tablet TAKE 1 TABLET BY MOUTH DAILY AT 12 NOON *DRUG NOT COVERED BY INSURANCE* 30 tablet 3  . traMADol (ULTRAM) 50 MG tablet Take 50 mg by mouth as needed for moderate pain.      No current facility-administered medications for this visit.    Allergies:    Allergies  Allergen Reactions  . Beef-Derived Products Shortness Of Breath and Rash  . Pork-Derived Products Shortness Of Breath and Rash  . Crestor [Rosuvastatin] Other (See Comments)    Muscle ache  . Percocet [Oxycodone-Acetaminophen] Itching  . Pitavastatin Other (See Comments)    Leg pain    Social History:  The patient  reports that he has quit smoking. His smoking use included Cigarettes. He has a 24 pack-year smoking history. He has never used smokeless tobacco. He reports that he drinks alcohol. He reports that he does not use illicit drugs.   ROS:  Please see the history of present illness.   Denies any syncope, bleeding, orthopnea, PND  PHYSICAL EXAM: VS:  BP 142/78 mmHg  Pulse 72  Ht 6\' 1"  (1.854 m)  Wt 204 lb (92.534 kg)  BMI 26.92 kg/m2 Well nourished, well developed, in no acute distress HEENT: normal Neck: no JVD Cardiac:  normal S1, S2; RRR; no murmur Lungs:  clear to auscultation bilaterally, no wheezing, rhonchi or rales Abd: soft, nontender, no hepatomegaly Ext: no edema Skin: warm and dry Neuro: no focal abnormalities noted       EKG:  06/08/14 -Sinus rhythm rate 80, no other  abnormalities.  Labs: 11/26/13-LDL 86, ALT 27 Nuclear stress test: 2013-inferior wall infarct/ ischemia-EF 44%  ASSESSMENT AND PLAN:  1. Coronary artery disease-prior circumflex stenting. Aspirin. He has been off of clopidogrel one year post stent. 2. Angina-Occasional shortness of breath. Nuclear stress test showed inferior ischemia/scar. He has not been on a beta blocker because of underlying fatigue but is now willing to trial low-dose Toprol 25. He did not tolerate isosorbide 15 mg because of headache. He even tried to take it  at night. He will now take this on as-needed basis.  The best thing to prevent future heart attacks are diet, exercise, statin medications. Livalo 2mg  which is a better tolerated statin, did not tolerate well, leg pains. Statin intolerance 3. Fatigue-certainly could be multifactorial. He has had workup previously by Dr. Inda Merlin. 4. Old myocardial infarction-previously silent. 5. Hyperlipidemia - Did not tolerate Crestor, Livalo 2mg  twice a week. Continue to encourage diet and exercise. 6. Followup 6 month  Signed, Candee Furbish, MD [facility]  07/12/2015 1:54 PM

## 2015-07-12 NOTE — Patient Instructions (Signed)
Medication Instructions:  Your physician recommends that you continue on your current medications as directed. Please refer to the Current Medication list given to you today.  Follow-Up: Follow up in 6 months with Dr. Skains.  You will receive a letter in the mail 2 months before you are due.  Please call us when you receive this letter to schedule your follow up appointment.  Thank you for choosing [facility]!!     

## 2015-07-26 DIAGNOSIS — J0100 Acute maxillary sinusitis, unspecified: Secondary | ICD-10-CM | POA: Diagnosis not present

## 2015-08-20 DIAGNOSIS — M47812 Spondylosis without myelopathy or radiculopathy, cervical region: Secondary | ICD-10-CM | POA: Diagnosis not present

## 2015-08-20 DIAGNOSIS — M47813 Spondylosis without myelopathy or radiculopathy, cervicothoracic region: Secondary | ICD-10-CM | POA: Diagnosis not present

## 2015-08-26 DIAGNOSIS — M47812 Spondylosis without myelopathy or radiculopathy, cervical region: Secondary | ICD-10-CM | POA: Diagnosis not present

## 2015-09-03 DIAGNOSIS — M4722 Other spondylosis with radiculopathy, cervical region: Secondary | ICD-10-CM | POA: Diagnosis not present

## 2015-09-03 DIAGNOSIS — M4802 Spinal stenosis, cervical region: Secondary | ICD-10-CM | POA: Diagnosis not present

## 2015-09-15 DIAGNOSIS — J3489 Other specified disorders of nose and nasal sinuses: Secondary | ICD-10-CM | POA: Diagnosis not present

## 2015-09-15 DIAGNOSIS — J302 Other seasonal allergic rhinitis: Secondary | ICD-10-CM | POA: Diagnosis not present

## 2015-09-15 DIAGNOSIS — M542 Cervicalgia: Secondary | ICD-10-CM | POA: Diagnosis not present

## 2015-10-05 DIAGNOSIS — H5203 Hypermetropia, bilateral: Secondary | ICD-10-CM | POA: Diagnosis not present

## 2015-10-05 DIAGNOSIS — H2513 Age-related nuclear cataract, bilateral: Secondary | ICD-10-CM | POA: Diagnosis not present

## 2015-10-18 ENCOUNTER — Ambulatory Visit (INDEPENDENT_AMBULATORY_CARE_PROVIDER_SITE_OTHER): Payer: Medicare Other | Admitting: Allergy and Immunology

## 2015-10-18 ENCOUNTER — Encounter: Payer: Self-pay | Admitting: Allergy and Immunology

## 2015-10-18 VITALS — BP 144/88 | HR 68 | Resp 16

## 2015-10-18 DIAGNOSIS — T7800XD Anaphylactic reaction due to unspecified food, subsequent encounter: Secondary | ICD-10-CM | POA: Diagnosis not present

## 2015-10-18 DIAGNOSIS — I208 Other forms of angina pectoris: Secondary | ICD-10-CM | POA: Diagnosis not present

## 2015-10-18 DIAGNOSIS — T7800XA Anaphylactic reaction due to unspecified food, initial encounter: Secondary | ICD-10-CM | POA: Insufficient documentation

## 2015-10-18 DIAGNOSIS — M50321 Other cervical disc degeneration at C4-C5 level: Secondary | ICD-10-CM | POA: Diagnosis not present

## 2015-10-18 DIAGNOSIS — M1288 Other specific arthropathies, not elsewhere classified, other specified site: Secondary | ICD-10-CM | POA: Diagnosis not present

## 2015-10-18 NOTE — Patient Instructions (Signed)
  1. Continue Epi-Pen if needed.  2. Check a Alpha-Gal panel  3. Maybe a mammal challenge???

## 2015-10-18 NOTE — Progress Notes (Signed)
[facility]  Follow-up Note  Refering Provider: Josetta Huddle, MD Primary Provider: Henrine Screws, MD  Subjective:   Sean Hammond is a 74 y.o. male who returns to the [facility] in re-evaluation of the following:  HPI Comments: . [name] returns to this clinic on 10/18/2015 in reevaluation of his alpha-gal syndrome. He has been avoiding all mammal consumption and carries an Epi-Pen. He still continues to use metoprolol for his coronary artery disease. He is interested in being retested for possible alpha gal syndrome resolution   Outpatient Encounter Prescriptions as of 10/18/2015  Medication Sig  . ammonium lactate (LAC-HYDRIN) 12 % lotion APPLY A THIN AMOUNT 2 TIMES DAILY AFTER BATHING TO ARMS AND HANDS  . aspirin 81 MG tablet Take 81 mg by mouth daily.  . Cholecalciferol (VITAMIN D HIGH POTENCY) 1000 UNITS capsule Take 1,000 Units by mouth daily.  . diphenhydrAMINE (BENADRYL) 25 MG tablet Take 25 mg by mouth as needed.  Marland Kitchen EPIPEN 2-PAK 0.3 MG/0.3ML SOAJ injection as needed.  . lidocaine (LIDODERM) 5 % APPLY 1 PATCH TO INTACT SKIN REMOVE AFTER 12 HOURS ONCE A DAY TO EACH KNEE EXTERNALLY 30 DAYS  . metoprolol succinate (TOPROL XL) 25 MG 24 hr tablet Take 1 tablet (25 mg total) by mouth daily.  . Multiple Vitamin (MULTIVITAMIN) capsule Take 1 capsule by mouth daily.  Marland Kitchen NITROSTAT 0.4 MG SL tablet PLACE 1 TABLET BY MOUTH UNDER THE TOUNGE EVERY 5 MINUTES AS NEEDED FOR CHEST PAIN  . pantoprazole (PROTONIX) 40 MG tablet TAKE 1 TABLET BY MOUTH DAILY AT 12 NOON *DRUG NOT COVERED BY INSURANCE*  . traMADol (ULTRAM) 50 MG tablet Take 50 mg by mouth as needed for moderate pain.   . [DISCONTINUED] feeding supplement (BOOST HIGH PROTEIN) LIQD Take 1 Container by mouth 2 (two) times a week.   No facility-administered encounter medications on file as of 10/18/2015.    No orders of the defined types were placed in this  encounter.    Past Medical History  Diagnosis Date  . Silent myocardial infarction ([place])   . Shortness of breath on exertion   . GERD (gastroesophageal reflux disease)   . Chronic headaches     "started  09/2011"  . Skin cancer     head, ear, back  . CAD (coronary artery disease)   . Hyperlipidemia   . Leukopenia   . ED (erectile dysfunction)   . Allergic rhinitis   . Insomnia     , post  . Epigastric abdominal pain   . Epicondylitis   . Prostatitis   . Epistaxis   . Osteoarthritis of hand   . Sinusitis   . Lung nodule     , Left upper  . Moderate obstructive sleep apnea     Cpap of 9cm of H2O - 05/2012    Past Surgical History  Procedure Laterality Date  . Coronary angioplasty with stent placement  12/14/11    "2"  . Knee arthroscopy  ` 2010    left  . Bone cyst excision  ~ 2004    left knee  . Nasal sinus surgery  ~ 2002 & 2003  . Eye surgery  ~ 2010    "both eyes; reshape pupil"  . Skin cancer excision      "back; head; ears"  . Left heart catheterization with coronary angiogram N/A 12/14/2011    Procedure: LEFT HEART CATHETERIZATION WITH CORONARY ANGIOGRAM;  Surgeon: Elta Guadeloupe  [name], MD;  Location: [facility] CATH LAB;  Service: Cardiovascular;  Laterality: N/A;  . Percutaneous coronary stent intervention (pci-s)  12/14/2011    Procedure: PERCUTANEOUS CORONARY STENT INTERVENTION (PCI-S);  Surgeon: Candee Furbish, MD;  Location: [facility] CATH LAB;  Service: Cardiovascular;;  . Cholecystectomy N/A 03/23/2015    Procedure: LAPAROSCOPIC CHOLECYSTECTOMY;  Surgeon: Ralene Ok, MD;  Location: [facility] ORS;  Service: General;  Laterality: N/A;    Allergies  Allergen Reactions  . Beef-Derived Products Shortness Of Breath and Rash  . Pork-Derived Products Shortness Of Breath and Rash  . Crestor [Rosuvastatin] Other (See Comments)    Muscle ache  . Percocet [Oxycodone-Acetaminophen] Itching  . Pitavastatin Other (See Comments)    Leg pain    Review of Systems  Constitutional: Negative.    HENT: Negative.   Eyes: Negative.   Respiratory: Negative.   Cardiovascular: Negative.   Gastrointestinal: Negative.   Musculoskeletal: Negative.   Skin: Negative.      Objective:   Filed Vitals:   10/18/15 0836  BP: 144/88  Pulse: 68  Resp: 16          Physical Exam  Constitutional: He appears well-developed and well-nourished. No distress.  HENT:  Head: Normocephalic and atraumatic. Head is without right periorbital erythema and without left periorbital erythema.  Right Ear: Tympanic membrane, external ear and ear canal normal. No drainage or tenderness. No foreign bodies. Tympanic membrane is not injected, not scarred, not perforated, not erythematous, not retracted and not bulging. No middle ear effusion.  Left Ear: Tympanic membrane, external ear and ear canal normal. No drainage or tenderness. No foreign bodies. Tympanic membrane is not injected, not scarred, not perforated, not erythematous, not retracted and not bulging.  No middle ear effusion.  Nose: Nose normal. No mucosal edema, rhinorrhea, nose lacerations or sinus tenderness.  No foreign bodies.  Mouth/Throat: Oropharynx is clear and moist. No oropharyngeal exudate, posterior oropharyngeal edema, posterior oropharyngeal erythema or tonsillar abscesses.  Eyes: Lids are normal. Right eye exhibits no chemosis, no discharge and no exudate. No foreign body present in the right eye. Left eye exhibits no chemosis, no discharge and no exudate. No foreign body present in the left eye. Right conjunctiva is not injected. Left conjunctiva is not injected.  Neck: Neck supple. No tracheal tenderness present. No tracheal deviation and no edema present. No thyroid mass and no thyromegaly present.  Cardiovascular: Normal rate, regular rhythm, S1 normal and S2 normal.  Exam reveals no gallop.   No murmur heard. Pulmonary/Chest: No accessory muscle usage or stridor. No respiratory distress. He has no wheezes. He has no rhonchi. He has  no rales.  Abdominal: Soft.  Musculoskeletal: He exhibits no edema.  Lymphadenopathy:       Head (right side): No tonsillar adenopathy present.       Head (left side): No tonsillar adenopathy present.    He has no cervical adenopathy.  Neurological: He is alert.  Skin: No rash noted. He is not diaphoretic.  Psychiatric: He has a normal mood and affect. His behavior is normal.    Diagnostics: .    Assessment and Plan:   1. Allergy with anaphylaxis due to food, subsequent encounter      1. Continue Epi-Pen if needed.  2. Check a Alpha-Gal panel  3. Maybe a mammal challenge???  Although I doubt that [place] has resolved his alpha gal syndrome he would like to pursue an evaluation for possible resolution by checking an alpha gal titer. I will contact him  with the results of his blood tests once it's available for review. He is on metoprolol but given his coronary artery disease there is a good indication for using this medication and although this is a relative contraindication in his setting of food allergy I think the use of this medication is acceptable.     Allena Katz, MD [place]

## 2015-10-20 ENCOUNTER — Telehealth: Payer: Self-pay | Admitting: Cardiology

## 2015-10-20 NOTE — Telephone Encounter (Signed)
Returned call.   Pt stated that he was given amitriptyline 25mg  day before yesterday by Dr Park Liter for neck pain.  Since then he has had come and go nausea and cp yesterday, none today.  Denies sob, dizziness, cp today.  Stated the nausea coincident with coffee consumption.  Advised the patient to contact the prescribing Dr and report these side effects.  He said that he thought that's what he needed to do, but called [place] first.

## 2015-10-20 NOTE — Telephone Encounter (Signed)
New message     Pt c/o of Chest Pain: STAT if CP now or developed within 24 hours  1. Are you having CP right now? no 2. Are you experiencing any other symptoms (ex. SOB, nausea, vomiting, sweating)?  Nausea 3. How long have you been experiencing CP? Since yesterday  4. Is your CP continuous or coming and going? Comes and goes 5. Have you taken Nitroglycerin? no Pt is taking amitriptyline HCL 25mg  from another doctor.  He thinks this is causing his chest pain.  He had chest pain yesterday ?

## 2015-10-25 ENCOUNTER — Other Ambulatory Visit: Payer: Self-pay | Admitting: Cardiology

## 2015-10-25 NOTE — Telephone Encounter (Signed)
Jerline Pain, MD at 07/12/2015 1:54 PM  metoprolol succinate (TOPROL XL) 25 MG 24 hr tabletTake 1 tablet (25 mg total) by mouth daily 1. Angina-Occasional shortness of breath. Nuclear stress test showed inferior ischemia/scar. He has not been on a beta blocker because of underlying fatigue but is now willing to trial low-dose Toprol 25. Patient Instructions     Medication Instructions:  Your physician recommends that you continue on your current medications as directed. Please refer to the Current Medication list given to you today.

## 2015-10-26 LAB — ALPHA-GAL PANEL
ALPHA GAL IGE: 9.21 kU/L — AB (ref <0.35)
Beef (Bos spp) IgE: 5.76 kU/L — ABNORMAL HIGH (ref <0.35)
Class Interpretation: 2
Class Interpretation: 3
Lamb/Mutton (Ovis spp) IgE: 2.42 kU/L — ABNORMAL HIGH (ref <0.35)
PORK CLASS INTERPRETATION: 3
Pork (Sus spp) IgE: 3.64 kU/L — ABNORMAL HIGH (ref <0.35)

## 2015-10-29 DIAGNOSIS — M50321 Other cervical disc degeneration at C4-C5 level: Secondary | ICD-10-CM | POA: Diagnosis not present

## 2015-10-29 DIAGNOSIS — M1288 Other specific arthropathies, not elsewhere classified, other specified site: Secondary | ICD-10-CM | POA: Diagnosis not present

## 2015-11-02 DIAGNOSIS — Z23 Encounter for immunization: Secondary | ICD-10-CM | POA: Diagnosis not present

## 2015-11-12 DIAGNOSIS — J0100 Acute maxillary sinusitis, unspecified: Secondary | ICD-10-CM | POA: Diagnosis not present

## 2015-11-29 DIAGNOSIS — M50321 Other cervical disc degeneration at C4-C5 level: Secondary | ICD-10-CM | POA: Diagnosis not present

## 2015-11-29 DIAGNOSIS — M1288 Other specific arthropathies, not elsewhere classified, other specified site: Secondary | ICD-10-CM | POA: Diagnosis not present

## 2015-12-23 ENCOUNTER — Encounter: Payer: Self-pay | Admitting: Cardiology

## 2015-12-23 ENCOUNTER — Ambulatory Visit (INDEPENDENT_AMBULATORY_CARE_PROVIDER_SITE_OTHER): Payer: Medicare Other | Admitting: Cardiology

## 2015-12-23 VITALS — BP 138/88 | HR 72 | Ht 73.0 in | Wt 199.0 lb

## 2015-12-23 DIAGNOSIS — I2510 Atherosclerotic heart disease of native coronary artery without angina pectoris: Secondary | ICD-10-CM

## 2015-12-23 DIAGNOSIS — R079 Chest pain, unspecified: Secondary | ICD-10-CM

## 2015-12-23 DIAGNOSIS — G4733 Obstructive sleep apnea (adult) (pediatric): Secondary | ICD-10-CM

## 2015-12-23 DIAGNOSIS — I208 Other forms of angina pectoris: Secondary | ICD-10-CM

## 2015-12-23 NOTE — Patient Instructions (Signed)

## 2015-12-23 NOTE — Progress Notes (Signed)
[facility]. [address], [address] [place],   [address] Phone: [phone] Fax:  [phone]  Date:  12/23/2015   ID:  KAION ARMINIO, DOB 08/25/1941, MRN [id number]  PCP:  Henrine Screws, MD   History of Present Illness: Sean Hammond is a 75 y.o. male with coronary artery disease status post overlapping drug eluting stents to his mid circumflex with prior ejection fraction of 44%. These were placed in January of 2013. CPAP for OSA. Prior nuclear stress test showed base to mid inferior wall ischemia in 2013. This was prior to stent placement in the circumflex artery.  Finally had gall bladder removed.   Had syncope vagal May 2016.  Previously felt chest pain with exertion as well as rest, sometimes when laying down, moderate in intensity resulting in a few minutes. Previously was offered beta blocker or other antianginal medications. He did not want to utilize these at prior visit. Today, he is willing to try these medications.  He has stopped Crestor/statins because of leg pains, flank pain. He thinks that his leg discomfort is getting better. He has seen Dr. Gladstone Lighter in the past who has also worked up gallbladder disease, normal.   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 Not significant. No associated arm pain or shoulder pain. Ultimately, it is very hard for him to decide whether or not he is actually having true anginal symptoms.  Could not tolerate Crestor. Hurt so bad. Livalo also had issues with. He will try twice a week dose. Unfortunately, he has not been taking this as directed.  Enjoying hunting. He is now focusing on his gallbladder issue. In February 2016 he ended up going to the emergency room. He is now seeing general surgery.  His wife is worried about him having a heart attack.   Enjoys rapid  hunting.   Wt Readings from Last 3 Encounters:  12/23/15 199 lb (90.266 kg)  07/12/15 204 lb (92.534 kg)  03/23/15 181 lb 3.5 oz (82.2 kg)     Past Medical History  Diagnosis Date  . Silent myocardial infarction ([place])   . Shortness of breath on exertion   . GERD (gastroesophageal reflux disease)   . Chronic headaches     "started  09/2011"  . Skin cancer     head, ear, back  . CAD (coronary artery disease)   . Hyperlipidemia   . Leukopenia   . ED (erectile dysfunction)   . Allergic rhinitis   . Insomnia     , post  . Epigastric abdominal pain   . Epicondylitis   . Prostatitis   . Epistaxis   . Osteoarthritis of hand   . Sinusitis   . Lung nodule     , Left upper  . Moderate obstructive sleep apnea     Cpap of 9cm of H2O - 05/2012    Past Surgical History  Procedure Laterality Date  . Coronary angioplasty with stent placement  12/14/11    "2"  . Knee arthroscopy  ` 2010    left  . Bone cyst excision  ~ 2004    left knee  . Nasal sinus surgery  ~ 2002 & 2003  . Eye surgery  ~ 2010    "both eyes; reshape pupil"  . Skin cancer excision      "  back; head; ears"  . Left heart catheterization with coronary angiogram N/A 12/14/2011    Procedure: LEFT HEART CATHETERIZATION WITH CORONARY ANGIOGRAM;  Surgeon: Candee Furbish, MD;  Location: [facility] CATH LAB;  Service: Cardiovascular;  Laterality: N/A;  . Percutaneous coronary stent intervention (pci-s)  12/14/2011    Procedure: PERCUTANEOUS CORONARY STENT INTERVENTION (PCI-S);  Surgeon: Candee Furbish, MD;  Location: [facility] CATH LAB;  Service: Cardiovascular;;  . Cholecystectomy N/A 03/23/2015    Procedure: LAPAROSCOPIC CHOLECYSTECTOMY;  Surgeon: Ralene Ok, MD;  Location: [facility] ORS;  Service: General;  Laterality: N/A;    Current Outpatient Prescriptions  Medication Sig Dispense Refill  . ammonium lactate (LAC-HYDRIN) 12 % lotion APPLY A THIN AMOUNT 2 TIMES DAILY AFTER BATHING TO ARMS AND HANDS  12  . aspirin 81 MG tablet Take 81 mg by  mouth daily.    . Cholecalciferol (VITAMIN D HIGH POTENCY) 1000 UNITS capsule Take 1,000 Units by mouth daily.    . diphenhydrAMINE (BENADRYL) 25 MG tablet Take 25 mg by mouth as needed.    Marland Kitchen EPIPEN 2-PAK 0.3 MG/0.3ML SOAJ injection as needed.    . lidocaine (LIDODERM) 5 % APPLY 1 PATCH TO INTACT SKIN REMOVE AFTER 12 HOURS ONCE A DAY TO EACH KNEE EXTERNALLY 30 DAYS  5  . metoprolol succinate (TOPROL-XL) 25 MG 24 hr tablet TAKE 1 TABLET (25 MG TOTAL) BY MOUTH DAILY. 90 tablet 1  . Multiple Vitamin (MULTIVITAMIN) capsule Take 1 capsule by mouth daily.    Marland Kitchen NITROSTAT 0.4 MG SL tablet PLACE 1 TABLET BY MOUTH UNDER THE TOUNGE EVERY 5 MINUTES AS NEEDED FOR CHEST PAIN 25 tablet 6  . pantoprazole (PROTONIX) 40 MG tablet TAKE 1 TABLET BY MOUTH DAILY AT 12 NOON *DRUG NOT COVERED BY INSURANCE* 30 tablet 3  . PREVIDENT 5000 SENSITIVE 1.1-5 % PSTE See admin instructions.  6  . traMADol (ULTRAM) 50 MG tablet Take 50 mg by mouth as needed for moderate pain.      No current facility-administered medications for this visit.    Allergies:    Allergies  Allergen Reactions  . Beef-Derived Products Shortness Of Breath and Rash  . Pork-Derived Products Shortness Of Breath and Rash  . Crestor [Rosuvastatin] Other (See Comments)    Muscle ache  . Percocet [Oxycodone-Acetaminophen] Itching  . Pitavastatin Other (See Comments)    Leg pain    Social History:  The patient  reports that he has quit smoking. His smoking use included Cigarettes. He has a 24 pack-year smoking history. He has never used smokeless tobacco. He reports that he drinks alcohol. He reports that he does not use illicit drugs.   ROS:  Please see the history of present illness.   Denies any syncope, bleeding, orthopnea, PND  PHYSICAL EXAM: VS:  BP 138/88 mmHg  Pulse 72  Ht 6\' 1"  (1.854 m)  Wt 199 lb (90.266 kg)  BMI 26.26 kg/m2 Well nourished, well developed, in no acute distress HEENT: normal Neck: no JVD Cardiac:  normal S1, S2;  RRR; no murmur Lungs:  clear to auscultation bilaterally, no wheezing, rhonchi or rales Abd: soft, nontender, no hepatomegaly Ext: no edema Skin: warm and dry Neuro: no focal abnormalities noted       EKG:  06/08/14 -Sinus rhythm rate 80, no other abnormalities.  Labs: 11/26/13-LDL 86, ALT 27 Nuclear stress test: 2013-inferior wall infarct/ ischemia-EF 44%  05/2014 - mild inferior inferior ischemia.   ASSESSMENT AND PLAN:  1. Coronary artery disease-prior circumflex stenting. Aspirin. Prior stent 2013.  Angina-Occasional shortness of breath. Nuclear stress test showed inferior ischemia/scar. He has not been on a beta blocker because of underlying fatigue but is now willing to trial low-dose Toprol 25. He did not tolerate isosorbide 15 mg because of headache. He even tried to take it at night. He will now take this on as-needed basis.  The best thing to prevent future heart attacks are diet, exercise, statin medications. Has tried Crestor, Livalo 2mg  which is a better tolerated statin, did not tolerate well, leg pains. Statin intolerance. Not interested in injectable. Now taking supplement.  2. Fatigue-certainly could be multifactorial. He has had workup previously by Dr. Inda Merlin. 3. Old myocardial infarction-previously silent. 4. Hyperlipidemia - Did not tolerate Crestor, Livalo 2mg  twice a week. Continue to encourage diet and exercise. Could try Zetia but not interested.  5. Followup 6 month  Signed, Candee Furbish, MD [facility]  12/23/2015 4:13 PM

## 2015-12-27 ENCOUNTER — Ambulatory Visit: Payer: Medicare Other | Admitting: Cardiology

## 2016-01-05 DIAGNOSIS — R1031 Right lower quadrant pain: Secondary | ICD-10-CM | POA: Diagnosis not present

## 2016-01-05 DIAGNOSIS — J329 Chronic sinusitis, unspecified: Secondary | ICD-10-CM | POA: Diagnosis not present

## 2016-01-10 DIAGNOSIS — L570 Actinic keratosis: Secondary | ICD-10-CM | POA: Diagnosis not present

## 2016-01-10 DIAGNOSIS — L300 Nummular dermatitis: Secondary | ICD-10-CM | POA: Diagnosis not present

## 2016-01-10 DIAGNOSIS — L299 Pruritus, unspecified: Secondary | ICD-10-CM | POA: Diagnosis not present

## 2016-01-11 ENCOUNTER — Other Ambulatory Visit: Payer: Self-pay | Admitting: Cardiology

## 2016-01-24 ENCOUNTER — Ambulatory Visit (INDEPENDENT_AMBULATORY_CARE_PROVIDER_SITE_OTHER): Payer: Medicare Other | Admitting: Podiatry

## 2016-01-24 ENCOUNTER — Encounter: Payer: Self-pay | Admitting: Podiatry

## 2016-01-24 VITALS — BP 155/84 | HR 69 | Resp 14

## 2016-01-24 DIAGNOSIS — L03031 Cellulitis of right toe: Secondary | ICD-10-CM | POA: Diagnosis not present

## 2016-01-24 DIAGNOSIS — L989 Disorder of the skin and subcutaneous tissue, unspecified: Secondary | ICD-10-CM | POA: Diagnosis not present

## 2016-01-24 DIAGNOSIS — I208 Other forms of angina pectoris: Secondary | ICD-10-CM

## 2016-01-24 NOTE — Progress Notes (Signed)
   Subjective:    Patient ID: Sean Hammond, male    DOB: August 05, 1941, 75 y.o.   MRN: [id number]  HPI patient presents the office with chief complaint of a painful outside border big toe, right foot says this been painful for the last week as he walks and wears his shoes. He states that he can no longer get to his nails and believes a nail is growing into his skin. He also says he has a corn on the fourth toe of the left foot which she treated with acid over a week ago. Finally, there is a skin lesion noted on the outside of the fifth toe nail, left foot. Patient states that the skin lesion on the fifth toes causing no pain or discomfort. He presents the office today for an evaluation and treatment of this condition The patient presents here today with right great toe pain since 1 week long and left 4th and 5th toe pain since 2 weeks long.   Review of Systems  All other systems reviewed and are negative.      Objective:   Physical Exam GENERAL APPEARANCE: Alert, conversant. Appropriately groomed. No acute distress.  VASCULAR: Pedal pulses palpable at  [facility] and PT bilateral.  Capillary refill time is immediate to all digits,  Normal temperature gradient.  Digital hair growth is present bilateral  NEUROLOGIC: sensation is normal to 5.07 monofilament at 5/5 sites bilateral.  Light touch is intact bilateral, Muscle strength normal.  MUSCULOSKELETAL: acceptable muscle strength, tone and stability bilateral.  Intrinsic muscluature intact bilateral.  Rectus appearance of foot and digits noted bilateral.   DERMATOLOGIC: skin color, texture, and turgor are within normal limits.  No preulcerative lesions or ulcers  are seen, no interdigital maceration noted.  No open lesions present.  . No drainage noted. There is cauliflower lesion on lateral aspect fifth toenail left foot.    NAIL  Pain redness and swelling at the distal aspect lateral border right hallux.        Assessment & Plan:  Paronychia right  hallux   Benign skin lesion left foot.  IE  Simple I &  D right hallux.  RTC prn   Gardiner Barefoot DPM

## 2016-01-31 DIAGNOSIS — J069 Acute upper respiratory infection, unspecified: Secondary | ICD-10-CM | POA: Diagnosis not present

## 2016-01-31 DIAGNOSIS — J029 Acute pharyngitis, unspecified: Secondary | ICD-10-CM | POA: Diagnosis not present

## 2016-02-15 DIAGNOSIS — M1712 Unilateral primary osteoarthritis, left knee: Secondary | ICD-10-CM | POA: Diagnosis not present

## 2016-02-15 DIAGNOSIS — M1711 Unilateral primary osteoarthritis, right knee: Secondary | ICD-10-CM | POA: Diagnosis not present

## 2016-02-15 DIAGNOSIS — M170 Bilateral primary osteoarthritis of knee: Secondary | ICD-10-CM | POA: Diagnosis not present

## 2016-03-24 DIAGNOSIS — I2510 Atherosclerotic heart disease of native coronary artery without angina pectoris: Secondary | ICD-10-CM | POA: Diagnosis not present

## 2016-03-24 DIAGNOSIS — E782 Mixed hyperlipidemia: Secondary | ICD-10-CM | POA: Diagnosis not present

## 2016-03-24 DIAGNOSIS — G4733 Obstructive sleep apnea (adult) (pediatric): Secondary | ICD-10-CM | POA: Diagnosis not present

## 2016-03-24 DIAGNOSIS — R5383 Other fatigue: Secondary | ICD-10-CM | POA: Diagnosis not present

## 2016-03-24 DIAGNOSIS — Z1389 Encounter for screening for other disorder: Secondary | ICD-10-CM | POA: Diagnosis not present

## 2016-03-24 DIAGNOSIS — Z79899 Other long term (current) drug therapy: Secondary | ICD-10-CM | POA: Diagnosis not present

## 2016-03-24 DIAGNOSIS — R972 Elevated prostate specific antigen [PSA]: Secondary | ICD-10-CM | POA: Diagnosis not present

## 2016-03-24 DIAGNOSIS — G609 Hereditary and idiopathic neuropathy, unspecified: Secondary | ICD-10-CM | POA: Diagnosis not present

## 2016-03-24 DIAGNOSIS — E559 Vitamin D deficiency, unspecified: Secondary | ICD-10-CM | POA: Diagnosis not present

## 2016-03-24 DIAGNOSIS — Z0001 Encounter for general adult medical examination with abnormal findings: Secondary | ICD-10-CM | POA: Diagnosis not present

## 2016-03-24 DIAGNOSIS — K219 Gastro-esophageal reflux disease without esophagitis: Secondary | ICD-10-CM | POA: Diagnosis not present

## 2016-04-27 ENCOUNTER — Other Ambulatory Visit: Payer: Self-pay | Admitting: Cardiology

## 2016-05-04 ENCOUNTER — Encounter: Payer: Self-pay | Admitting: *Deleted

## 2016-05-04 ENCOUNTER — Encounter: Payer: Self-pay | Admitting: Cardiology

## 2016-05-04 ENCOUNTER — Ambulatory Visit (INDEPENDENT_AMBULATORY_CARE_PROVIDER_SITE_OTHER): Payer: Medicare Other | Admitting: Cardiology

## 2016-05-04 VITALS — BP 148/78 | HR 66 | Ht 73.0 in | Wt 202.0 lb

## 2016-05-04 DIAGNOSIS — Z01818 Encounter for other preprocedural examination: Secondary | ICD-10-CM

## 2016-05-04 DIAGNOSIS — I2510 Atherosclerotic heart disease of native coronary artery without angina pectoris: Secondary | ICD-10-CM | POA: Diagnosis not present

## 2016-05-04 DIAGNOSIS — I25119 Atherosclerotic heart disease of native coronary artery with unspecified angina pectoris: Secondary | ICD-10-CM

## 2016-05-04 DIAGNOSIS — I208 Other forms of angina pectoris: Secondary | ICD-10-CM | POA: Diagnosis not present

## 2016-05-04 DIAGNOSIS — I209 Angina pectoris, unspecified: Secondary | ICD-10-CM | POA: Diagnosis not present

## 2016-05-04 LAB — CBC
HCT: 43 % (ref 38.5–50.0)
Hemoglobin: 14.7 g/dL (ref 13.2–17.1)
MCH: 31.1 pg (ref 27.0–33.0)
MCHC: 34.2 g/dL (ref 32.0–36.0)
MCV: 91.1 fL (ref 80.0–100.0)
MPV: 9.9 fL (ref 7.5–12.5)
Platelets: 230 10*3/uL (ref 140–400)
RBC: 4.72 MIL/uL (ref 4.20–5.80)
RDW: 13.8 % (ref 11.0–15.0)
WBC: 5.8 10*3/uL (ref 3.8–10.8)

## 2016-05-04 LAB — BASIC METABOLIC PANEL
BUN: 13 mg/dL (ref 7–25)
CO2: 27 mmol/L (ref 20–31)
CREATININE: 1 mg/dL (ref 0.70–1.18)
Calcium: 9.9 mg/dL (ref 8.6–10.3)
Chloride: 107 mmol/L (ref 98–110)
Glucose, Bld: 92 mg/dL (ref 65–99)
Potassium: 4.2 mmol/L (ref 3.5–5.3)
Sodium: 140 mmol/L (ref 135–146)

## 2016-05-04 LAB — PROTIME-INR
INR: 0.9
PROTHROMBIN TIME: 9.3 s (ref 9.0–11.5)

## 2016-05-04 NOTE — Patient Instructions (Signed)
Medication Instructions:  The current medical regimen is effective;  continue present plan and medications.  Labwork: Please have blood work today (BMP,CBC and PT/INR)  Testing/Procedures: Your physician has requested that you have a cardiac catheterization. Cardiac catheterization is used to diagnose and/or treat various heart conditions. Doctors may recommend this procedure for a number of different reasons. The most common reason is to evaluate chest pain. Chest pain can be a symptom of coronary artery disease (CAD), and cardiac catheterization can show whether plaque is narrowing or blocking your heart's arteries. This procedure is also used to evaluate the valves, as well as measure the blood flow and oxygen levels in different parts of your heart. For further information please visit [email or web address]. Please follow instruction sheet, as given.  Follow-Up: Follow up 2 weeks after cardiac cath.  If you need a refill on your cardiac medications before your next appointment, please call your pharmacy.  Thank you for choosing [facility]!!

## 2016-05-04 NOTE — Progress Notes (Signed)
[facility]. [address], [address], [place]  [address] Phone: [phone] Fax:  [phone]  Date:  05/04/2016   ID:  ATEM MUSCARI, DOB 1941/10/05, MRN [id number]  PCP:  Henrine Screws, MD   History of Present Illness: Sean Hammond is a 75 y.o. male with coronary artery disease status post overlapping drug eluting stents to his mid circumflex with prior ejection fraction of 44%. These were placed in January of 2013. CPAP for OSA. Prior nuclear stress test showed base to mid inferior wall ischemia in 2013. This was prior to stent placement in the circumflex artery.  Still having fairly significant exertional fatigue. Relieved with rest. Discussed with Dr. Inda Merlin. In the past, we have offered cardiac catheterization after mild inferior wall ischemia was seen almost recent stress test. He had declined in the past but now seems willing.    Cholecystectomy  Had vasovagal syncope May 2016.  Previously felt chest pain with exertion as well as rest, sometimes when laying down, moderate in intensity resulting in a few minutes. On low-dose beta blocker.  He has stopped Crestor/statins because of leg pains, flank pain. He thinks that his leg discomfort is getting better. He has seen Dr. Gladstone Lighter in the past who has also worked up gallbladder disease, normal.   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 Relieved with rest.   Could not tolerate Crestor. Hurt so bad. Livalo also had issues with. He will try twice a week dose.    Enjoying hunting.  His wife is worried about him having a heart attack.  Enjoys rabit hunting.   Wt Readings from Last 3 Encounters:  05/04/16 202 lb (91.627 kg)  12/23/15 199 lb (90.266 kg)  07/12/15 204 lb (92.534 kg)     Past Medical History  Diagnosis Date  . Silent myocardial infarction ([place])   . Shortness of breath on exertion   . GERD (gastroesophageal  reflux disease)   . Chronic headaches     "started  09/2011"  . Skin cancer     head, ear, back  . CAD (coronary artery disease)   . Hyperlipidemia   . Leukopenia   . ED (erectile dysfunction)   . Allergic rhinitis   . Insomnia     , post  . Epigastric abdominal pain   . Epicondylitis   . Prostatitis   . Epistaxis   . Osteoarthritis of hand   . Sinusitis   . Lung nodule     , Left upper  . Moderate obstructive sleep apnea     Cpap of 9cm of H2O - 05/2012    Past Surgical History  Procedure Laterality Date  . Coronary angioplasty with stent placement  12/14/11    "2"  . Knee arthroscopy  ` 2010    left  . Bone cyst excision  ~ 2004    left knee  . Nasal sinus surgery  ~ 2002 & 2003  . Eye surgery  ~ 2010    "both eyes; reshape pupil"  . Skin cancer excision      "back; head; ears"  . Left heart catheterization with coronary angiogram N/A 12/14/2011    Procedure: LEFT HEART CATHETERIZATION WITH CORONARY ANGIOGRAM;  Surgeon: Candee Furbish, MD;  Location: [facility] CATH LAB;  Service: Cardiovascular;  Laterality: N/A;  . Percutaneous coronary stent intervention (pci-s)  12/14/2011  Procedure: PERCUTANEOUS CORONARY STENT INTERVENTION (PCI-S);  Surgeon: Candee Furbish, MD;  Location: [facility] CATH LAB;  Service: Cardiovascular;;  . Cholecystectomy N/A 03/23/2015    Procedure: LAPAROSCOPIC CHOLECYSTECTOMY;  Surgeon: Ralene Ok, MD;  Location: [facility] ORS;  Service: General;  Laterality: N/A;    Current Outpatient Prescriptions  Medication Sig Dispense Refill  . Cholecalciferol (VITAMIN D HIGH POTENCY) 1000 UNITS capsule Take 1,000 Units by mouth daily.    . diphenhydrAMINE (BENADRYL) 25 MG tablet Take 25 mg by mouth every 6 (six) hours as needed for itching or allergies.     Marland Kitchen EPIPEN 2-PAK 0.3 MG/0.3ML SOAJ injection Inject 0.3 mg into the muscle once as needed (For anaphylaxis.).     Marland Kitchen metoprolol succinate (TOPROL-XL) 25 MG 24 hr tablet TAKE 1 TABLET (25 MG TOTAL) BY MOUTH DAILY. 90 tablet 2    . Multiple Vitamin (MULTIVITAMIN) capsule Take 1 capsule by mouth daily.    Marland Kitchen NITROSTAT 0.4 MG SL tablet PLACE 1 TABLET BY MOUTH UNDER THE TOUNGE EVERY 5 MINUTES AS NEEDED FOR CHEST PAIN 25 tablet 6  . pantoprazole (PROTONIX) 40 MG tablet TAKE 1 TABLET BY MOUTH DAILY AT 12 NOON *DRUG NOT COVERED BY INSURANCE* 30 tablet 6  . PREVIDENT 5000 SENSITIVE 1.1-5 % PSTE Take 1 application by mouth 2 (two) times daily.   6  . traMADol (ULTRAM) 50 MG tablet Take 50 mg by mouth every 6 (six) hours as needed for moderate pain.     Marland Kitchen aspirin EC 81 MG tablet Take 81 mg by mouth daily.    Marland Kitchen lidocaine (LIDODERM) 5 % Place 1 patch onto the skin daily as needed (For pain.). Remove & Discard patch within 12 hours or as directed by MD    . OVER THE COUNTER MEDICATION Place 2 drops into both eyes as needed (For eye irritation.). Allergy Eye Relief Drops    . OVER THE COUNTER MEDICATION Take 60 mLs by mouth daily. Vemma Liquid Antioxidant Vitamin Supplement     No current facility-administered medications for this visit.    Allergies:    Allergies  Allergen Reactions  . Beef-Derived Products Shortness Of Breath and Rash  . Pork-Derived Products Shortness Of Breath and Rash  . Crestor [Rosuvastatin] Other (See Comments)    Muscle ache  . Percocet [Oxycodone-Acetaminophen] Itching  . Pitavastatin Other (See Comments)    Leg pain    Social History:  The patient  reports that he has quit smoking. His smoking use included Cigarettes. He has a 24 pack-year smoking history. He has never used smokeless tobacco. He reports that he drinks alcohol. He reports that he does not use illicit drugs.   ROS:  Please see the history of present illness.   Denies any syncope, bleeding, orthopnea, PND  PHYSICAL EXAM: VS:  BP 148/78 mmHg  Pulse 66  Ht 6\' 1"  (1.854 m)  Wt 202 lb (91.627 kg)  BMI 26.66 kg/m2 Well nourished, well developed, in no acute distress HEENT: normal Neck: no JVD Cardiac:  normal S1, S2; RRR; no  murmur Lungs:  clear to auscultation bilaterally, no wheezing, rhonchi or rales Abd: soft, nontender, no hepatomegaly Ext: no edemaRadial 2+ Skin: warm and dry Neuro: no focal abnormalities noted       EKG:  EKG was ordered today. 05/04/2016-sinus rhythm, 66, no other abnormalities. 06/08/14 -Sinus rhythm rate 80, no other abnormalities.  Labs: 11/26/13-LDL 86, ALT 27 Nuclear stress test: 2013-inferior wall infarct/ ischemia-EF 44%  05/2014 - mild inferior inferior ischemia.  ASSESSMENT AND PLAN:  1. Coronary artery disease-prior circumflex stenting. Aspirin. Prior stent 2013. Angina/exertional angina -fatigue,  shortness of breath. Nuclear stress test showed inferior ischemia/scar.  proceeding with cardiac catheterization.low-dose Toprol 25. He did not tolerate isosorbide 15 mg because of headache. He even tried to take it at night. He will now take this on as-needed basis.  The best thing to prevent future heart attacks are diet, exercise, statin medications. Has tried Crestor, Livalo 2mg  which is a better tolerated statin, did not tolerate well, leg pains. Statin intolerance. Not interested in injectable. Now taking supplement.  2. Fatigue-certainly could be multifactorial. He has had workup previously by Dr. Inda Merlin. Today was asked specifically by Dr. Inda Merlin to rule out critical coronary artery disease, patient with exertional fatigue. Has refused prior cardiac catheterizations but now according to Dr. Inda Merlin seems willing. Last catheterization stent of circumflex in January 2013. We'll proceed with cardiac catheterization, risks and benefits explained in quitting stroke, heart attack, death, renal impairment, bleeding. 3. Old myocardial infarction-previously silent. 4. Hyperlipidemia - Did not tolerate Crestor, Livalo 2mg  twice a week. Continue to encourage diet and exercise. Could try Zetia but not interested.  5. Followup post cath  Signed, Candee Furbish, MD [facility]  05/04/2016 10:30 AM

## 2016-05-09 ENCOUNTER — Encounter (HOSPITAL_COMMUNITY)
Admission: RE | Disposition: A | Discharge status: Home / Self Care | Payer: Self-pay | Source: Ambulatory Visit | Attending: Cardiology

## 2016-05-09 ENCOUNTER — Encounter (HOSPITAL_COMMUNITY): Payer: Self-pay | Admitting: Cardiology

## 2016-05-09 ENCOUNTER — Ambulatory Visit (HOSPITAL_COMMUNITY)
Admission: RE | Admit: 2016-05-09 | Discharge: 2016-05-09 | Disposition: A | Discharge status: Home / Self Care | Payer: Medicare Other | Source: Ambulatory Visit | Attending: Cardiology | Admitting: Cardiology

## 2016-05-09 DIAGNOSIS — M19049 Primary osteoarthritis, unspecified hand: Secondary | ICD-10-CM | POA: Diagnosis not present

## 2016-05-09 DIAGNOSIS — R5383 Other fatigue: Secondary | ICD-10-CM | POA: Diagnosis not present

## 2016-05-09 DIAGNOSIS — K219 Gastro-esophageal reflux disease without esophagitis: Secondary | ICD-10-CM | POA: Insufficient documentation

## 2016-05-09 DIAGNOSIS — G4733 Obstructive sleep apnea (adult) (pediatric): Secondary | ICD-10-CM | POA: Diagnosis not present

## 2016-05-09 DIAGNOSIS — G4700 Insomnia, unspecified: Secondary | ICD-10-CM | POA: Diagnosis not present

## 2016-05-09 DIAGNOSIS — Z87891 Personal history of nicotine dependence: Secondary | ICD-10-CM | POA: Insufficient documentation

## 2016-05-09 DIAGNOSIS — Z7982 Long term (current) use of aspirin: Secondary | ICD-10-CM | POA: Diagnosis not present

## 2016-05-09 DIAGNOSIS — I25119 Atherosclerotic heart disease of native coronary artery with unspecified angina pectoris: Secondary | ICD-10-CM | POA: Diagnosis not present

## 2016-05-09 DIAGNOSIS — E785 Hyperlipidemia, unspecified: Secondary | ICD-10-CM | POA: Diagnosis not present

## 2016-05-09 DIAGNOSIS — Z955 Presence of coronary angioplasty implant and graft: Secondary | ICD-10-CM | POA: Insufficient documentation

## 2016-05-09 DIAGNOSIS — I252 Old myocardial infarction: Secondary | ICD-10-CM | POA: Insufficient documentation

## 2016-05-09 HISTORY — PX: CARDIAC CATHETERIZATION: SHX172

## 2016-05-09 SURGERY — LEFT HEART CATH AND CORONARY ANGIOGRAPHY

## 2016-05-09 MED ORDER — SODIUM CHLORIDE 0.9 % WEIGHT BASED INFUSION: 3.0000 mL/kg/h | INTRAVENOUS | Status: DC

## 2016-05-09 MED ORDER — SODIUM CHLORIDE 0.9% FLUSH: 3.0000 mL | INTRAVENOUS | Status: DC | PRN

## 2016-05-09 MED ORDER — HEPARIN SODIUM (PORCINE) 1000 UNIT/ML IJ SOLN
INTRAMUSCULAR | Status: DC | PRN
  Administered 2016-05-09: 4500 [IU] via INTRAVENOUS

## 2016-05-09 MED ORDER — ASPIRIN 81 MG PO CHEW
81.0000 mg | CHEWABLE_TABLET | ORAL | Status: AC
  Administered 2016-05-09: 81 mg via ORAL

## 2016-05-09 MED ORDER — SODIUM CHLORIDE 0.9% FLUSH: 3.0000 mL | Freq: Two times a day (BID) | INTRAVENOUS | Status: DC

## 2016-05-09 MED ORDER — VERAPAMIL HCL 2.5 MG/ML IV SOLN
INTRAVENOUS | Status: AC
  Filled 2016-05-09: qty 2

## 2016-05-09 MED ORDER — METHYLPREDNISOLONE SODIUM SUCC 125 MG IJ SOLR
INTRAMUSCULAR | Status: DC | PRN
  Administered 2016-05-09: 125 mg via INTRAVENOUS

## 2016-05-09 MED ORDER — ASPIRIN 81 MG PO CHEW
CHEWABLE_TABLET | ORAL | Status: AC
  Filled 2016-05-09: qty 1

## 2016-05-09 MED ORDER — MIDAZOLAM HCL 2 MG/2ML IJ SOLN
INTRAMUSCULAR | Status: DC | PRN
  Administered 2016-05-09: 2 mg via INTRAVENOUS

## 2016-05-09 MED ORDER — SODIUM CHLORIDE 0.9 % IV SOLN: 250.0000 mL | INTRAVENOUS | Status: DC | PRN

## 2016-05-09 MED ORDER — METHYLPREDNISOLONE SODIUM SUCC 125 MG IJ SOLR
INTRAMUSCULAR | Status: AC
  Filled 2016-05-09: qty 2

## 2016-05-09 MED ORDER — LIDOCAINE HCL (PF) 1 % IJ SOLN
INTRAMUSCULAR | Status: AC
Start: 2016-05-09 — End: 2016-05-09
  Filled 2016-05-09: qty 30

## 2016-05-09 MED ORDER — FAMOTIDINE IN NACL 20-0.9 MG/50ML-% IV SOLN
INTRAVENOUS | Status: AC
  Filled 2016-05-09: qty 50

## 2016-05-09 MED ORDER — SODIUM CHLORIDE 0.9 % WEIGHT BASED INFUSION: 1.0000 mL/kg/h | INTRAVENOUS | Status: DC

## 2016-05-09 MED ORDER — HEPARIN (PORCINE) IN NACL 2-0.9 UNIT/ML-% IJ SOLN
INTRAMUSCULAR | Status: AC
  Filled 2016-05-09: qty 1000

## 2016-05-09 MED ORDER — MIDAZOLAM HCL 2 MG/2ML IJ SOLN
INTRAMUSCULAR | Status: AC
  Filled 2016-05-09: qty 2

## 2016-05-09 MED ORDER — ASPIRIN 81 MG PO CHEW: 81.0000 mg | CHEWABLE_TABLET | ORAL | Status: DC

## 2016-05-09 MED ORDER — SODIUM CHLORIDE 0.9 % WEIGHT BASED INFUSION
3.0000 mL/kg/h | INTRAVENOUS | Status: DC
  Administered 2016-05-09: 3 mL/kg/h via INTRAVENOUS

## 2016-05-09 MED ORDER — FENTANYL CITRATE (PF) 100 MCG/2ML IJ SOLN
INTRAMUSCULAR | Status: DC | PRN
  Administered 2016-05-09: 25 ug via INTRAVENOUS

## 2016-05-09 MED ORDER — VERAPAMIL HCL 2.5 MG/ML IV SOLN
INTRAVENOUS | Status: DC | PRN
  Administered 2016-05-09: 08:00:00 via INTRA_ARTERIAL

## 2016-05-09 MED ORDER — DIPHENHYDRAMINE HCL 50 MG/ML IJ SOLN
INTRAMUSCULAR | Status: DC | PRN
  Administered 2016-05-09: 25 mg via INTRAVENOUS

## 2016-05-09 MED ORDER — DIPHENHYDRAMINE HCL 50 MG/ML IJ SOLN
INTRAMUSCULAR | Status: AC
  Filled 2016-05-09: qty 1

## 2016-05-09 MED ORDER — HEPARIN SODIUM (PORCINE) 1000 UNIT/ML IJ SOLN
INTRAMUSCULAR | Status: AC
  Filled 2016-05-09: qty 1

## 2016-05-09 MED ORDER — FAMOTIDINE IN NACL 20-0.9 MG/50ML-% IV SOLN
INTRAVENOUS | Status: DC | PRN
  Administered 2016-05-09: 20 mg via INTRAVENOUS

## 2016-05-09 MED ORDER — FENTANYL CITRATE (PF) 100 MCG/2ML IJ SOLN
INTRAMUSCULAR | Status: AC
  Filled 2016-05-09: qty 2

## 2016-05-09 SURGICAL SUPPLY — 11 items
CATH INFINITI 5 FR JL3.5 (CATHETERS) ×2 IMPLANT
CATH INFINITI 5FR ANG PIGTAIL (CATHETERS) ×2 IMPLANT
CATH INFINITI JR4 5F (CATHETERS) ×2 IMPLANT
DEVICE RAD COMP TR BAND LRG (VASCULAR PRODUCTS) ×2 IMPLANT
GLIDESHEATH SLEND SS 6F .021 (SHEATH) ×2 IMPLANT
KIT HEART LEFT (KITS) ×3 IMPLANT
PACK CARDIAC CATHETERIZATION (CUSTOM PROCEDURE TRAY) ×3 IMPLANT
SYR MEDRAD MARK V 150ML (SYRINGE) ×3 IMPLANT
TRANSDUCER W/STOPCOCK (MISCELLANEOUS) ×3 IMPLANT
TUBING CIL FLEX 10 FLL-RA (TUBING) ×3 IMPLANT
WIRE SAFE-T 1.5MM-J .035X260CM (WIRE) ×2 IMPLANT

## 2016-05-09 NOTE — Interval H&P Note (Signed)
History and Physical Interval Note:  05/09/2016 6:43 AM  Sean Hammond  has presented today for surgery, with the diagnosis of angina - cad  The various methods of treatment have been discussed with the patient and family. After consideration of risks, benefits and other options for treatment, the patient has consented to  Procedure(s): Left Heart Cath and Coronary Angiography (N/A) as a surgical intervention .  The patient's history has been reviewed, patient examined, no change in status, stable for surgery.  I have reviewed the patient's chart and labs.  Questions were answered to the patient's satisfaction.     [employer]

## 2016-05-09 NOTE — H&P (View-Only) (Signed)
[place]. [address] [place], [place]  [address] Phone: [phone] Fax:  [phone]  Date:  05/04/2016   ID:  RANBIR YOHEY, DOB 03/06/1941, MRN [id number]  PCP:  Henrine Screws, MD   History of Present Illness: Sean Hammond is a 75 y.o. male with coronary artery disease status post overlapping drug eluting stents to his mid circumflex with prior ejection fraction of 44%. These were placed in January of 2013. CPAP for OSA. Prior nuclear stress test showed base to mid inferior wall ischemia in 2013. This was prior to stent placement in the circumflex artery.  Still having fairly significant exertional fatigue. Relieved with rest. Discussed with Dr. Inda Merlin. In the past, we have offered cardiac catheterization after mild inferior wall ischemia was seen almost recent stress test. He had declined in the past but now seems willing.    Cholecystectomy  Had vasovagal syncope May 2016.  Previously felt chest pain with exertion as well as rest, sometimes when laying down, moderate in intensity resulting in a few minutes. On low-dose beta blocker.  He has stopped Crestor/statins because of leg pains, flank pain. He thinks that his leg discomfort is getting better. He has seen Dr. Gladstone Lighter in the past who has also worked up gallbladder disease, normal.   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 Relieved with rest.   Could not tolerate Crestor. Hurt so bad. Livalo also had issues with. He will try twice a week dose.    Enjoying hunting.  His wife is worried about him having a heart attack.  Enjoys rabit hunting.   Wt Readings from Last 3 Encounters:  05/04/16 202 lb (91.627 kg)  12/23/15 199 lb (90.266 kg)  07/12/15 204 lb (92.534 kg)     Past Medical History  Diagnosis Date  . Silent myocardial infarction ([place])   . Shortness of breath on exertion   . GERD (gastroesophageal  reflux disease)   . Chronic headaches     "started  09/2011"  . Skin cancer     head, ear, back  . CAD (coronary artery disease)   . Hyperlipidemia   . Leukopenia   . ED (erectile dysfunction)   . Allergic rhinitis   . Insomnia     , post  . Epigastric abdominal pain   . Epicondylitis   . Prostatitis   . Epistaxis   . Osteoarthritis of hand   . Sinusitis   . Lung nodule     , Left upper  . Moderate obstructive sleep apnea     Cpap of 9cm of H2O - 05/2012    Past Surgical History  Procedure Laterality Date  . Coronary angioplasty with stent placement  12/14/11    "2"  . Knee arthroscopy  ` 2010    left  . Bone cyst excision  ~ 2004    left knee  . Nasal sinus surgery  ~ 2002 & 2003  . Eye surgery  ~ 2010    "both eyes; reshape pupil"  . Skin cancer excision      "back; head; ears"  . Left heart catheterization with coronary angiogram N/A 12/14/2011    Procedure: LEFT HEART CATHETERIZATION WITH CORONARY ANGIOGRAM;  Surgeon: Candee Furbish, MD;  Location: [facility] CATH LAB;  Service: Cardiovascular;  Laterality: N/A;  . Percutaneous coronary stent intervention (pci-s)  12/14/2011  Procedure: PERCUTANEOUS CORONARY STENT INTERVENTION (PCI-S);  Surgeon: Candee Furbish, MD;  Location: [facility] CATH LAB;  Service: Cardiovascular;;  . Cholecystectomy N/A 03/23/2015    Procedure: LAPAROSCOPIC CHOLECYSTECTOMY;  Surgeon: Ralene Ok, MD;  Location: [facility] ORS;  Service: General;  Laterality: N/A;    Current Outpatient Prescriptions  Medication Sig Dispense Refill  . Cholecalciferol (VITAMIN D HIGH POTENCY) 1000 UNITS capsule Take 1,000 Units by mouth daily.    . diphenhydrAMINE (BENADRYL) 25 MG tablet Take 25 mg by mouth every 6 (six) hours as needed for itching or allergies.     Marland Kitchen EPIPEN 2-PAK 0.3 MG/0.3ML SOAJ injection Inject 0.3 mg into the muscle once as needed (For anaphylaxis.).     Marland Kitchen metoprolol succinate (TOPROL-XL) 25 MG 24 hr tablet TAKE 1 TABLET (25 MG TOTAL) BY MOUTH DAILY. 90 tablet 2    . Multiple Vitamin (MULTIVITAMIN) capsule Take 1 capsule by mouth daily.    Marland Kitchen NITROSTAT 0.4 MG SL tablet PLACE 1 TABLET BY MOUTH UNDER THE TOUNGE EVERY 5 MINUTES AS NEEDED FOR CHEST PAIN 25 tablet 6  . pantoprazole (PROTONIX) 40 MG tablet TAKE 1 TABLET BY MOUTH DAILY AT 12 NOON *DRUG NOT COVERED BY INSURANCE* 30 tablet 6  . PREVIDENT 5000 SENSITIVE 1.1-5 % PSTE Take 1 application by mouth 2 (two) times daily.   6  . traMADol (ULTRAM) 50 MG tablet Take 50 mg by mouth every 6 (six) hours as needed for moderate pain.     Marland Kitchen aspirin EC 81 MG tablet Take 81 mg by mouth daily.    Marland Kitchen lidocaine (LIDODERM) 5 % Place 1 patch onto the skin daily as needed (For pain.). Remove & Discard patch within 12 hours or as directed by MD    . OVER THE COUNTER MEDICATION Place 2 drops into both eyes as needed (For eye irritation.). Allergy Eye Relief Drops    . OVER THE COUNTER MEDICATION Take 60 mLs by mouth daily. Vemma Liquid Antioxidant Vitamin Supplement     No current facility-administered medications for this visit.    Allergies:    Allergies  Allergen Reactions  . Beef-Derived Products Shortness Of Breath and Rash  . Pork-Derived Products Shortness Of Breath and Rash  . Crestor [Rosuvastatin] Other (See Comments)    Muscle ache  . Percocet [Oxycodone-Acetaminophen] Itching  . Pitavastatin Other (See Comments)    Leg pain    Social History:  The patient  reports that he has quit smoking. His smoking use included Cigarettes. He has a 24 pack-year smoking history. He has never used smokeless tobacco. He reports that he drinks alcohol. He reports that he does not use illicit drugs.   ROS:  Please see the history of present illness.   Denies any syncope, bleeding, orthopnea, PND  PHYSICAL EXAM: VS:  BP 148/78 mmHg  Pulse 66  Ht 6\' 1"  (1.854 m)  Wt 202 lb (91.627 kg)  BMI 26.66 kg/m2 Well nourished, well developed, in no acute distress HEENT: normal Neck: no JVD Cardiac:  normal S1, S2; RRR; no  murmur Lungs:  clear to auscultation bilaterally, no wheezing, rhonchi or rales Abd: soft, nontender, no hepatomegaly Ext: no edemaRadial 2+ Skin: warm and dry Neuro: no focal abnormalities noted       EKG:  EKG was ordered today. 05/04/2016-sinus rhythm, 66, no other abnormalities. 06/08/14 -Sinus rhythm rate 80, no other abnormalities.  Labs: 11/26/13-LDL 86, ALT 27 Nuclear stress test: 2013-inferior wall infarct/ ischemia-EF 44%  05/2014 - mild inferior inferior ischemia.  ASSESSMENT AND PLAN:  1. Coronary artery disease-prior circumflex stenting. Aspirin. Prior stent 2013. Angina/exertional angina -fatigue,  shortness of breath. Nuclear stress test showed inferior ischemia/scar.  proceeding with cardiac catheterization.low-dose Toprol 25. He did not tolerate isosorbide 15 mg because of headache. He even tried to take it at night. He will now take this on as-needed basis.  The best thing to prevent future heart attacks are diet, exercise, statin medications. Has tried Crestor, Livalo 2mg  which is a better tolerated statin, did not tolerate well, leg pains. Statin intolerance. Not interested in injectable. Now taking supplement.  2. Fatigue-certainly could be multifactorial. He has had workup previously by Dr. Inda Merlin. Today was asked specifically by Dr. Inda Merlin to rule out critical coronary artery disease, patient with exertional fatigue. Has refused prior cardiac catheterizations but now according to Dr. Inda Merlin seems willing. Last catheterization stent of circumflex in January 2013. We'll proceed with cardiac catheterization, risks and benefits explained in quitting stroke, heart attack, death, renal impairment, bleeding. 3. Old myocardial infarction-previously silent. 4. Hyperlipidemia - Did not tolerate Crestor, Livalo 2mg  twice a week. Continue to encourage diet and exercise. Could try Zetia but not interested.  5. Followup post cath  Signed, Candee Furbish, MD [facility]  05/04/2016 10:30 AM

## 2016-05-09 NOTE — Discharge Instructions (Signed)
Radial Site Care °Refer to this sheet in the next few weeks. These instructions provide you with information about caring for yourself after your procedure. Your health care provider may also give you more specific instructions. Your treatment has been planned according to current medical practices, but problems sometimes occur. Call your health care provider if you have any problems or questions after your procedure. °WHAT TO EXPECT AFTER THE PROCEDURE °After your procedure, it is typical to have the following: °· Bruising at the radial site that usually fades within 1-2 weeks. °· Blood collecting in the tissue (hematoma) that may be painful to the touch. It should usually decrease in size and tenderness within 1-2 weeks. °HOME CARE INSTRUCTIONS °· Take medicines only as directed by your health care provider. °· You may shower 24-48 hours after the procedure or as directed by your health care provider. Remove the bandage (dressing) and gently wash the site with plain soap and water. Pat the area dry with a clean towel. Do not rub the site, because this may cause bleeding. °· Do not take baths, swim, or use a hot tub until your health care provider approves. °· Check your insertion site every day for redness, swelling, or drainage. °· Do not apply powder or lotion to the site. °· Do not flex or bend the affected arm for 24 hours or as directed by your health care provider. °· Do not push or pull heavy objects with the affected arm for 24 hours or as directed by your health care provider. °· Do not lift over 10 lb (4.5 kg) for 5 days after your procedure or as directed by your health care provider. °· Ask your health care provider when it is okay to: °¨ Return to work or school. °¨ Resume usual physical activities or sports. °¨ Resume sexual activity. °· Do not drive home if you are discharged the same day as the procedure. Have someone else drive you. °· You may drive 24 hours after the procedure unless otherwise  instructed by your health care provider. °· Do not operate machinery or power tools for 24 hours after the procedure. °· If your procedure was done as an outpatient procedure, which means that you went home the same day as your procedure, a responsible adult should be with you for the first 24 hours after you arrive home. °· Keep all follow-up visits as directed by your health care provider. This is important. °SEEK MEDICAL CARE IF: °· You have a fever. °· You have chills. °· You have increased bleeding from the radial site. Hold pressure on the site. °SEEK IMMEDIATE MEDICAL CARE IF: °· You have unusual pain at the radial site. °· You have redness, warmth, or swelling at the radial site. °· You have drainage (other than a small amount of blood on the dressing) from the radial site. °· The radial site is bleeding, and the bleeding does not stop after 30 minutes of holding steady pressure on the site. °· Your arm or hand becomes pale, cool, tingly, or numb. °  °This information is not intended to replace advice given to you by your health care provider. Make sure you discuss any questions you have with your health care provider. °  °Document Released: 12/09/2010 Document Revised: 11/27/2014 Document Reviewed: 05/25/2014 °Elsevier Interactive Patient Education ©2016 [employer] ° °

## 2016-05-10 MED FILL — Lidocaine HCl Local Preservative Free (PF) Inj 1%: INTRAMUSCULAR | Qty: 30 | Status: AC

## 2016-05-10 MED FILL — Heparin Sodium (Porcine) 2 Unit/ML in Sodium Chloride 0.9%: INTRAMUSCULAR | Qty: 500 | Status: AC

## 2016-05-15 DIAGNOSIS — J0100 Acute maxillary sinusitis, unspecified: Secondary | ICD-10-CM | POA: Diagnosis not present

## 2016-05-17 DIAGNOSIS — L570 Actinic keratosis: Secondary | ICD-10-CM | POA: Diagnosis not present

## 2016-05-28 NOTE — Progress Notes (Signed)
Cardiology Office Note:    Date:  05/29/2016   ID:  Sean Hammond, DOB [birth year], MRN [id number]  PCP:  Henrine Screws, MD  Cardiologist:  Dr. Candee Furbish   Electrophysiologist:  n/a  Referring MD: Josetta Huddle, MD   Chief Complaint  Patient presents with  . Coronary Artery Disease    s/p Catheterization    History of Present Illness:     Sean Hammond is a 75 y.o. male with a hx of CAD s/p overlapping DES to mLCx in 2013, OSA on CPAP, HL with intol to statins, vasovagal syncope in 2016.  He has had some complaints of chest discomfort and a Nuclear stress test in 2015 was abnormal with inferior ischemia. He was previously hesitant to proceed with cardiac cath. He did not tolerate nitrates due to HA.  When last seen by Dr. Candee Furbish 05/04/16, he was agreeable to proceed with LHC.  LHC was done 05/09/16 and demonstrated patent LCx stents and no obstructive disease elsewhere. He returns for FU.   Here alone.  He is doing well.  Only complaint is easy bruising.  Platelet count last month was normal.  The patient denies chest pain, shortness of breath, syncope, orthopnea, PND or significant pedal edema.    Past Medical History  Diagnosis Date  . Silent myocardial infarction ([place])   . GERD (gastroesophageal reflux disease)   . Chronic headaches     "started  09/2011"  . Skin cancer     head, ear, back  . CAD (coronary artery disease)     a. s/p DES to LCx x 2 in 2013 // b. Myoview 7/15: inferior ischemia, EF 52%, low risk  //  c. LHC 6/17: LCx stents patent, no sig CAD elsewhere, EF 65% >> Med Rx  . Hyperlipidemia   . Leukopenia   . ED (erectile dysfunction)   . Allergic rhinitis   . Insomnia     , post  . Epicondylitis   . Prostatitis   . Epistaxis   . Osteoarthritis of hand   . Lung nodule     , Left upper  . Moderate obstructive sleep apnea     Cpap of 9cm of H2O - 05/2012  . History of echocardiogram     a. Echo 5/16: mild LVH, EF 55-60%, no RWMA, PASP 33 mmHg     Past Surgical History  Procedure Laterality Date  . Coronary angioplasty with stent placement  12/14/11    "2"  . Knee arthroscopy  ` 2010    left  . Bone cyst excision  ~ 2004    left knee  . Nasal sinus surgery  ~ 2002 & 2003  . Eye surgery  ~ 2010    "both eyes; reshape pupil"  . Skin cancer excision      "back; head; ears"  . Left heart catheterization with coronary angiogram N/A 12/14/2011    Procedure: LEFT HEART CATHETERIZATION WITH CORONARY ANGIOGRAM;  Surgeon: Candee Furbish, MD;  [facility] CATH LAB;  Service: Cardiovascular;  Laterality: N/A;  . Percutaneous coronary stent intervention (pci-s)  12/14/2011    Procedure: PERCUTANEOUS CORONARY STENT INTERVENTION (PCI-S);  Surgeon: Candee Furbish, MD;  Location: [facility] CATH LAB;  Service: Cardiovascular;;  . Cholecystectomy N/A 03/23/2015    Procedure: LAPAROSCOPIC CHOLECYSTECTOMY;  Surgeon: Ralene Ok, MD;  Location: [facility] ORS;  Service: General;  Laterality: N/A;  . Cardiac catheterization N/A 05/09/2016    Procedure: Left Heart Cath and Coronary Angiography;  Surgeon: Thana Farr  [name], MD;  Location: [facility] CV LAB;  Service: Cardiovascular;  Laterality: N/A;    Current Medications: Outpatient Prescriptions Prior to Visit  Medication Sig Dispense Refill  . aspirin EC 81 MG tablet Take 81 mg by mouth daily.    . Cholecalciferol (VITAMIN D HIGH POTENCY) 1000 UNITS capsule Take 1,000 Units by mouth daily.    . diphenhydrAMINE (BENADRYL) 25 MG tablet Take 25 mg by mouth every 6 (six) hours as needed for itching or allergies.     Marland Kitchen EPIPEN 2-PAK 0.3 MG/0.3ML SOAJ injection Inject 0.3 mg into the muscle once as needed (For anaphylaxis.).     Marland Kitchen lidocaine (LIDODERM) 5 % Place 1 patch onto the skin daily as needed (For pain.). Remove & Discard patch within 12 hours or as directed by MD    . metoprolol succinate (TOPROL-XL) 25 MG 24 hr tablet TAKE 1 TABLET (25 MG TOTAL) BY MOUTH DAILY. 90 tablet 2  . Multiple Vitamin (MULTIVITAMIN) capsule  Take 1 capsule by mouth daily.    Marland Kitchen NITROSTAT 0.4 MG SL tablet PLACE 1 TABLET BY MOUTH UNDER THE TOUNGE EVERY 5 MINUTES AS NEEDED FOR CHEST PAIN 25 tablet 6  . OVER THE COUNTER MEDICATION Place 2 drops into both eyes as needed (For eye irritation.). Allergy Eye Relief Drops    . OVER THE COUNTER MEDICATION Take 60 mLs by mouth daily. Vemma Liquid Antioxidant Vitamin Supplement    . pantoprazole (PROTONIX) 40 MG tablet TAKE 1 TABLET BY MOUTH DAILY AT 12 NOON *DRUG NOT COVERED BY INSURANCE* 30 tablet 6  . PREVIDENT 5000 SENSITIVE 1.1-5 % PSTE Take 1 application by mouth 2 (two) times daily.   6  . traMADol (ULTRAM) 50 MG tablet Take 50 mg by mouth every 6 (six) hours as needed for moderate pain.      No facility-administered medications prior to visit.      Allergies:   Beef-derived products; Pork-derived products; Crestor; Percocet; and Pitavastatin   Social History   Social History  . Marital Status: Married    Spouse Name: N/A  . Number of Children: N/A  . Years of Education: N/A   Social History Main Topics  . Smoking status: Former Smoker -- 2.00 packs/day for 12 years    Types: Cigarettes  . Smokeless tobacco: Never Used     Comment: "quit smoking cigarettes 1975"  . Alcohol Use: Yes     Comment: "last beer 1969"  . Drug Use: No  . Sexual Activity: Not Currently   Other Topics Concern  . None   Social History Narrative     Family History:  The patient's family history includes Heart attack in his mother; Hypertension in his mother.   ROS:   Please see the history of present illness.    Review of Systems  Hematologic/Lymphatic: Positive for bleeding problem. Bruises/bleeds easily.   All other systems reviewed and are negative.   Physical Exam:    VS:  BP 132/80 mmHg  Pulse 60  Ht 6\' 1"  (1.854 m)  Wt 199 lb 1.9 oz (90.32 kg)  BMI 26.28 kg/m2   Physical Exam  Constitutional: He is oriented to person, place, and time. He appears well-developed and well-nourished.   HENT:  Head: Normocephalic and atraumatic.  Neck: No JVD present.  Cardiovascular: Normal rate, regular rhythm and normal heart sounds.   No murmur heard. Pulmonary/Chest: Effort normal and breath sounds normal. He has no wheezes. He has no rales.  Abdominal: Soft. There is no  tenderness.  Musculoskeletal: He exhibits no edema.  right wrist without hematoma or mass   Neurological: He is alert and oriented to person, place, and time.  Skin: Skin is warm and dry.  Psychiatric: He has a normal mood and affect.    Wt Readings from Last 3 Encounters:  05/29/16 199 lb 1.9 oz (90.32 kg)  05/09/16 200 lb (90.719 kg)  05/04/16 202 lb (91.627 kg)     Studies/Labs Reviewed:     EKG:  EKG is ordered today.  The ekg ordered today demonstrates NSR, HR 61, normal axis, QTC 402 ms, no change from prior tracing  Recent Labs: 05/04/2016: BUN 13; Creat 1.00; Hemoglobin 14.7; Platelets 230; Potassium 4.2; Sodium 140   Recent Lipid Panel    Component Value Date/Time   CHOL 165 03/22/2015 0503   TRIG 73 03/22/2015 0503   HDL 45 03/22/2015 0503   CHOLHDL 3.7 03/22/2015 0503   VLDL 15 03/22/2015 0503   LDLCALC 105* 03/22/2015 0503    Additional studies/ records that were reviewed today include:   LHC 05/09/16  Dominance: Right   Left Main  Vessel was injected. Vessel is normal in caliber.     Left Anterior Descending  Vessel was injected. Vessel is normal in caliber.     Left Circumflex  Vessel was injected. Vessel is normal in caliber. Widely patent stents     Right Coronary Artery  Vessel was injected. Vessel is normal in caliber.    Left Ventricle The left ventricular size is normal. The ejection fraction is calculated to be 65%. Ejection fraction quantitative: 65%. There are no wall motion abnormalities in the left ventricle.  Widely patent circumflex stents previously placed  No other angiographically significant coronary artery disease present.  Normal left  ventriculogram, no mitral regurgitation, no aortic stenosis, EF 65%  Echo 5/16 - Left ventricle: The cavity size was normal. Wall thickness was   increased in a pattern of mild LVH. Systolic function was normal.   The estimated ejection fraction was in the range of 55% to 60%.   Wall motion was normal; there were no regional wall motion   abnormalities. Left ventricular diastolic function parameters   were normal. - Pulmonary arteries: PA peak pressure: 33 mm Hg (S).  Myoview 7/15 Overall Impression:  The study is suggestive of some inferior ischemia. This is very minor by quantitative analysis. Visually it appears somewhat more marked. I believe there is some inferior ischemia. This would be considered a low risk scan because of the small number of segments. LV Ejection Fraction: 52%.  LV Wall Motion:  Normal Wall Motion.   ASSESSMENT:     1. Coronary artery disease involving native coronary artery of native heart without angina pectoris   2. Hyperlipidemia     PLAN:     In order of problems listed above:  1. CAD - He has had a history of chest discomfort. Prior Myoview in 2015 with small amount of inferior ischemia, overall low risk. With continued chest discomfort, he was set for cardiac catheterization several weeks ago. As noted, this demonstrated patent LCx stents and no significant CAD elsewhere. Medical therapy was continued. He denies recurrent chest discomfort. Continue aspirin, beta blocker. He is intolerant to all statins. He is not willing to try Zetia or PCSK-9 inhibitor therapy. He notes easy bruisability. Given history of PCI with overlapping stenting, he needs to remain on continuous antiplatelet therapy with aspirin. CBC last month demonstrated normal platelets.   2. HL -  Follow-up with PCP. As noted, he is not interested in trying Zetia or PCSK-9 inhibitor therapy.   Medication Adjustments/Labs and Tests Ordered: Current medicines are reviewed at length with the  patient today.  Concerns regarding medicines are outlined above.  Medication changes, Labs and Tests ordered today are outlined in the Patient Instructions noted below. Patient Instructions  Medication Instructions:  Your physician recommends that you continue on your current medications as directed. Please refer to the Current Medication list given to you today. Labwork: NONE Testing/Procedures: NONE Follow-Up: Your physician wants you to follow-up in: 6 MONTHS WITH DR. [name] You will receive a reminder letter in the mail two months in advance. If you don't receive a letter, please call our office to schedule the follow-up appointment. Any Other Special Instructions Will Be Listed Below (If Applicable). If you need a refill on your cardiac medications before your next appointment, please call your pharmacy.   Signed, Richardson Dopp, PA-C  05/29/2016 10:05 AM    [facility] [place] [address], [place], [place]  [address] Phone: [phone]; Fax: [phone]

## 2016-05-29 ENCOUNTER — Ambulatory Visit (INDEPENDENT_AMBULATORY_CARE_PROVIDER_SITE_OTHER): Payer: Medicare Other | Admitting: Physician Assistant

## 2016-05-29 ENCOUNTER — Encounter: Payer: Self-pay | Admitting: Physician Assistant

## 2016-05-29 VITALS — BP 132/80 | HR 60 | Ht 73.0 in | Wt 199.1 lb

## 2016-05-29 DIAGNOSIS — I2510 Atherosclerotic heart disease of native coronary artery without angina pectoris: Secondary | ICD-10-CM | POA: Diagnosis not present

## 2016-05-29 DIAGNOSIS — I208 Other forms of angina pectoris: Secondary | ICD-10-CM

## 2016-05-29 DIAGNOSIS — E785 Hyperlipidemia, unspecified: Secondary | ICD-10-CM

## 2016-05-29 NOTE — Patient Instructions (Addendum)
Medication Instructions:  Your physician recommends that you continue on your current medications as directed. Please refer to the Current Medication list given to you today.  Labwork: NONE  Testing/Procedures: NONE  Follow-Up: Your physician wants you to follow-up in: 6 MONTHS WITH DR. SKAINS. You will receive a reminder letter in the mail two months in advance. If you don't receive a letter, please call our office to schedule the follow-up appointment.  Any Other Special Instructions Will Be Listed Below (If Applicable).  If you need a refill on your cardiac medications before your next appointment, please call your pharmacy. 

## 2016-07-13 DIAGNOSIS — N3090 Cystitis, unspecified without hematuria: Secondary | ICD-10-CM | POA: Diagnosis not present

## 2016-08-08 DIAGNOSIS — Z23 Encounter for immunization: Secondary | ICD-10-CM | POA: Diagnosis not present

## 2016-08-14 ENCOUNTER — Ambulatory Visit (INDEPENDENT_AMBULATORY_CARE_PROVIDER_SITE_OTHER): Payer: Medicare Other | Admitting: Podiatry

## 2016-08-14 ENCOUNTER — Encounter: Payer: Self-pay | Admitting: Podiatry

## 2016-08-14 VITALS — BP 160/93 | HR 67 | Resp 14

## 2016-08-14 DIAGNOSIS — L84 Corns and callosities: Secondary | ICD-10-CM | POA: Diagnosis not present

## 2016-08-14 DIAGNOSIS — L989 Disorder of the skin and subcutaneous tissue, unspecified: Secondary | ICD-10-CM

## 2016-08-14 DIAGNOSIS — M2041 Other hammer toe(s) (acquired), right foot: Secondary | ICD-10-CM

## 2016-08-14 NOTE — Progress Notes (Signed)
This patient presents to the office with chief complaint of a painful corn on the inside fifth toe right foot. Patient states that is painful, especially wearing his leather shoes at church. He is aware not to use any acid, but presents the office today for treatment of this condition  GENERAL APPEARANCE: Alert, conversant. Appropriately groomed. No acute distress.  VASCULAR: Pedal pulses are  palpable at  [facility] and PT bilateral.  Capillary refill time is immediate to all digits,  Normal temperature gradient.  Digital hair growth is present bilateral  NEUROLOGIC: sensation is normal to 5.07 monofilament at 5/5 sites bilateral.  Light touch is intact bilateral, Muscle strength normal.  MUSCULOSKELETAL: acceptable muscle strength, tone and stability bilateral.  Intrinsic muscluature intact bilateral.  Rectus appearance of foot and digits noted bilateral.   DERMATOLOGIC: skin color, texture, and turgor are within normal limits.  No preulcerative lesions or ulcers  are seen, no interdigital maceration noted.  No open lesions present.  Digital nails are asymptomatic. No drainage noted.Corn noted medial aspect fifth toe at level of nail.  Corn secondary to hammer toe fifth toe right foot.  Debridement of corn.  Padding was dispensed.  Told him he could use lambs wool.  RTC prn   Gardiner Barefoot DPM

## 2016-08-15 DIAGNOSIS — M1711 Unilateral primary osteoarthritis, right knee: Secondary | ICD-10-CM | POA: Diagnosis not present

## 2016-08-15 DIAGNOSIS — M170 Bilateral primary osteoarthritis of knee: Secondary | ICD-10-CM | POA: Diagnosis not present

## 2016-08-23 DIAGNOSIS — J0100 Acute maxillary sinusitis, unspecified: Secondary | ICD-10-CM | POA: Diagnosis not present

## 2016-09-01 DIAGNOSIS — M5033 Other cervical disc degeneration, cervicothoracic region: Secondary | ICD-10-CM | POA: Diagnosis not present

## 2016-09-01 DIAGNOSIS — M5030 Other cervical disc degeneration, unspecified cervical region: Secondary | ICD-10-CM | POA: Diagnosis not present

## 2016-09-01 DIAGNOSIS — M542 Cervicalgia: Secondary | ICD-10-CM | POA: Diagnosis not present

## 2016-09-18 DIAGNOSIS — L570 Actinic keratosis: Secondary | ICD-10-CM | POA: Diagnosis not present

## 2016-09-20 ENCOUNTER — Other Ambulatory Visit: Payer: Self-pay | Admitting: Cardiology

## 2016-09-29 DIAGNOSIS — M170 Bilateral primary osteoarthritis of knee: Secondary | ICD-10-CM | POA: Diagnosis not present

## 2016-09-29 DIAGNOSIS — K219 Gastro-esophageal reflux disease without esophagitis: Secondary | ICD-10-CM | POA: Diagnosis not present

## 2016-09-29 DIAGNOSIS — E782 Mixed hyperlipidemia: Secondary | ICD-10-CM | POA: Diagnosis not present

## 2016-09-29 DIAGNOSIS — G4733 Obstructive sleep apnea (adult) (pediatric): Secondary | ICD-10-CM | POA: Diagnosis not present

## 2016-09-29 DIAGNOSIS — R5383 Other fatigue: Secondary | ICD-10-CM | POA: Diagnosis not present

## 2016-09-29 DIAGNOSIS — R972 Elevated prostate specific antigen [PSA]: Secondary | ICD-10-CM | POA: Diagnosis not present

## 2016-09-29 DIAGNOSIS — E559 Vitamin D deficiency, unspecified: Secondary | ICD-10-CM | POA: Diagnosis not present

## 2016-09-29 DIAGNOSIS — G609 Hereditary and idiopathic neuropathy, unspecified: Secondary | ICD-10-CM | POA: Diagnosis not present

## 2016-09-29 DIAGNOSIS — G4701 Insomnia due to medical condition: Secondary | ICD-10-CM | POA: Diagnosis not present

## 2016-09-29 DIAGNOSIS — R1031 Right lower quadrant pain: Secondary | ICD-10-CM | POA: Diagnosis not present

## 2016-10-09 DIAGNOSIS — J019 Acute sinusitis, unspecified: Secondary | ICD-10-CM | POA: Diagnosis not present

## 2016-10-09 DIAGNOSIS — H2513 Age-related nuclear cataract, bilateral: Secondary | ICD-10-CM | POA: Diagnosis not present

## 2016-10-09 DIAGNOSIS — R05 Cough: Secondary | ICD-10-CM | POA: Diagnosis not present

## 2016-10-09 DIAGNOSIS — H52203 Unspecified astigmatism, bilateral: Secondary | ICD-10-CM | POA: Diagnosis not present

## 2016-10-17 ENCOUNTER — Other Ambulatory Visit: Payer: Self-pay | Admitting: Cardiology

## 2016-11-06 DIAGNOSIS — M1712 Unilateral primary osteoarthritis, left knee: Secondary | ICD-10-CM | POA: Diagnosis not present

## 2016-11-06 DIAGNOSIS — M170 Bilateral primary osteoarthritis of knee: Secondary | ICD-10-CM | POA: Diagnosis not present

## 2016-11-06 DIAGNOSIS — M1711 Unilateral primary osteoarthritis, right knee: Secondary | ICD-10-CM | POA: Diagnosis not present

## 2016-11-28 ENCOUNTER — Ambulatory Visit: Payer: Medicare Other | Admitting: Cardiology

## 2016-12-04 ENCOUNTER — Ambulatory Visit: Payer: Medicare Other | Admitting: Podiatry

## 2016-12-08 DIAGNOSIS — J0100 Acute maxillary sinusitis, unspecified: Secondary | ICD-10-CM | POA: Diagnosis not present

## 2016-12-22 ENCOUNTER — Ambulatory Visit (INDEPENDENT_AMBULATORY_CARE_PROVIDER_SITE_OTHER): Payer: Medicare Other | Admitting: Cardiology

## 2016-12-22 ENCOUNTER — Encounter: Payer: Self-pay | Admitting: Cardiology

## 2016-12-22 VITALS — BP 128/72 | HR 80 | Ht 73.0 in | Wt 194.0 lb

## 2016-12-22 DIAGNOSIS — I25119 Atherosclerotic heart disease of native coronary artery with unspecified angina pectoris: Secondary | ICD-10-CM | POA: Diagnosis not present

## 2016-12-22 DIAGNOSIS — I252 Old myocardial infarction: Secondary | ICD-10-CM | POA: Diagnosis not present

## 2016-12-22 DIAGNOSIS — E7800 Pure hypercholesterolemia, unspecified: Secondary | ICD-10-CM | POA: Diagnosis not present

## 2016-12-22 DIAGNOSIS — I2510 Atherosclerotic heart disease of native coronary artery without angina pectoris: Secondary | ICD-10-CM | POA: Diagnosis not present

## 2016-12-22 NOTE — Progress Notes (Signed)
[facility]. [address], [address] [place], [place]  [address] Phone: [phone] Fax:  [phone]  Date:  12/22/2016   ID:  Sean Hammond, DOB 02/06/1941, MRN [id number]  PCP:  Henrine Screws, MD   History of Present Illness: Sean Hammond is a 76 y.o. male with coronary artery disease status post overlapping drug eluting stents to his mid circumflex with prior ejection fraction of 44%. These were placed in January of 2013. CPAP for OSA. Prior nuclear stress test showed base to mid inferior wall ischemia in 2013. This was prior to stent placement in the circumflex artery.  He ended up having a cardiac catheterization in June 2017 which showed patent circumflex stent and normal coronary arteries otherwise. Normal EF. Reassurance.  In the past a major complaint was exertional fatigue. He seems to be improving from this.  Cholecystectomy  Had vasovagal syncope May 2016.  Previously felt chest pain with exertion as well as rest, sometimes when laying down, moderate in intensity resulting in a few minutes. On low-dose beta blocker.  He has stopped Crestor/statins because of leg pains, flank pain. He thinks that his leg discomfort is getting better. He has seen Dr. Gladstone Lighter in the past who has also worked up gallbladder disease, normal.   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   Could not tolerate Crestor. Hurt so bad. Livalo also had issues with.    Enjoying hunting.   Wt Readings from Last 3 Encounters:  12/22/16 194 lb (88 kg)  05/29/16 199 lb 1.9 oz (90.3 kg)  05/09/16 200 lb (90.7 kg)     Past Medical History:  Diagnosis Date  . Allergic rhinitis   . CAD (coronary artery disease)    a. s/p DES to LCx x 2 in 2013 // b. Myoview 7/15: inferior ischemia, EF 52%, low risk  //  c. LHC 6/17: LCx stents patent, no sig CAD elsewhere, EF 65% >> Med Rx  . Chronic headaches    "started   09/2011"  . ED (erectile dysfunction)   . Epicondylitis   . Epistaxis   . GERD (gastroesophageal reflux disease)   . History of echocardiogram    a. Echo 5/16: mild LVH, EF 55-60%, no RWMA, PASP 33 mmHg  . Hyperlipidemia   . Insomnia    , post  . Leukopenia   . Lung nodule    , Left upper  . Moderate obstructive sleep apnea    Cpap of 9cm of H2O - 05/2012  . Osteoarthritis of hand   . Prostatitis   . Silent myocardial infarction   . Skin cancer    head, ear, back    Past Surgical History:  Procedure Laterality Date  . BONE CYST EXCISION  ~ 2004   left knee  . CARDIAC CATHETERIZATION N/A 05/09/2016   Procedure: Left Heart Cath and Coronary Angiography;  Surgeon: Jerline Pain, MD;  Location: [facility] CV LAB;  Service: Cardiovascular;  Laterality: N/A;  . CHOLECYSTECTOMY N/A 03/23/2015   Procedure: LAPAROSCOPIC CHOLECYSTECTOMY;  Surgeon: Ralene Ok, MD;  Location: [facility] ORS;  Service: General;  Laterality: N/A;  . CORONARY ANGIOPLASTY WITH STENT PLACEMENT  12/14/11   "2"  . EYE SURGERY  ~ 2010   "both eyes; reshape pupil"  . KNEE ARTHROSCOPY  ` 2010   left  . LEFT HEART CATHETERIZATION WITH CORONARY ANGIOGRAM N/A  12/14/2011   Procedure: LEFT HEART CATHETERIZATION WITH CORONARY ANGIOGRAM;  Surgeon: Candee Furbish, MD;  Location: [facility] CATH LAB;  Service: Cardiovascular;  Laterality: N/A;  . NASAL SINUS SURGERY  ~ 2002 & 2003  . PERCUTANEOUS CORONARY STENT INTERVENTION (PCI-S)  12/14/2011   Procedure: PERCUTANEOUS CORONARY STENT INTERVENTION (PCI-S);  Surgeon: Candee Furbish, MD;  Location: [facility] CATH LAB;  Service: Cardiovascular;;  . SKIN CANCER EXCISION     "back; head; ears"    Current Outpatient Prescriptions  Medication Sig Dispense Refill  . aspirin EC 81 MG tablet Take 81 mg by mouth daily.    . Cholecalciferol (VITAMIN D HIGH POTENCY) 1000 UNITS capsule Take 1,000 Units by mouth daily.    . diphenhydrAMINE (BENADRYL) 25 MG tablet Take 25 mg by mouth every 6 (six) hours as  needed for itching or allergies.     Marland Kitchen EPIPEN 2-PAK 0.3 MG/0.3ML SOAJ injection Inject 0.3 mg into the muscle once as needed (For anaphylaxis.).     Marland Kitchen lidocaine (LIDODERM) 5 % Place 1 patch onto the skin daily as needed (For pain.). Remove & Discard patch within 12 hours or as directed by MD    . metoprolol succinate (TOPROL-XL) 25 MG 24 hr tablet TAKE 1 TABLET (25 MG TOTAL) BY MOUTH DAILY. 90 tablet 2  . Multiple Vitamin (MULTIVITAMIN) capsule Take 1 capsule by mouth daily.    . nitroGLYCERIN (NITROSTAT) 0.4 MG SL tablet PLACE 1 TABLET UNDER TONGUE EVERY 5 MINUTES AS NEEDED FOR CHEST PAIN 25 tablet 3  . OVER THE COUNTER MEDICATION Place 2 drops into both eyes as needed (For eye irritation.). Allergy Eye Relief Drops    . OVER THE COUNTER MEDICATION Take 60 mLs by mouth daily. Vemma Liquid Antioxidant Vitamin Supplement    . pantoprazole (PROTONIX) 40 MG tablet TAKE 1 TABLET BY MOUTH DAILY AT 12 NOON *DRUG NOT COVERED BY INSURANCE* 30 tablet 6  . PREVIDENT 5000 SENSITIVE 1.1-5 % PSTE Take 1 application by mouth 2 (two) times daily.   6  . traMADol (ULTRAM) 50 MG tablet Take 50 mg by mouth every 6 (six) hours as needed for moderate pain.      No current facility-administered medications for this visit.     Allergies:    Allergies  Allergen Reactions  . Beef-Derived Products Shortness Of Breath and Rash  . Pork-Derived Products Shortness Of Breath and Rash  . Crestor [Rosuvastatin] Other (See Comments)    Muscle ache  . Percocet [Oxycodone-Acetaminophen] Itching  . Pitavastatin Other (See Comments)    Leg pain    Social History:  The patient  reports that he has quit smoking. His smoking use included Cigarettes. He has a 24.00 pack-year smoking history. He has never used smokeless tobacco. He reports that he drinks alcohol. He reports that he does not use drugs.   ROS:  Please see the history of present illness.   Denies any syncope, bleeding, orthopnea, PND  PHYSICAL EXAM: VS:  BP  128/72   Pulse 80   Ht 6\' 1"  (1.854 m)   Wt 194 lb (88 kg)   BMI 25.60 kg/m  Well nourished, well developed, in no acute distress HEENT: normal Neck: no JVD Cardiac:  normal S1, S2; RRR; no murmur Lungs:  clear to auscultation bilaterally, no wheezing, rhonchi or rales Abd: soft, nontender, no hepatomegaly Ext: no edemaRadial 2+ Skin: warm and dry Neuro: no focal abnormalities noted       EKG:  EKG was ordered today. 05/04/2016-sinus rhythm,  66, no other abnormalities. 06/08/14 -Sinus rhythm rate 80, no other abnormalities.   Labs: 11/26/13-LDL 86, ALT 27  Nuclear stress test: 2013-inferior wall infarct/ ischemia-EF 44%  05/2014 - mild inferior inferior ischemia.   Cath 05/09/16 Conclusion    Widely patent circumflex stents previously placed  No other angiographically significant coronary artery disease present.  Normal left ventriculogram, no mitral regurgitation, no aortic stenosis, EF 65%   Candee Furbish, MD Continue with medical therapy, reassurance     ASSESSMENT AND PLAN:  1. Coronary artery disease-prior circumflex stenting. Aspirin. Prior stent 2013.Reassuring cardiac catheterization June 2017. Angina/exertional angina -fatigue,  shortness of breath. This seems to be improved Toprol 25. He did not tolerate isosorbide 15 mg because of headache. He even tried to take it at night. He will now take this on as-needed basis.  The best thing to prevent future heart attacks are diet, exercise, statin medications. Has tried Crestor, Livalo 2mg  which is a better tolerated statin, did not tolerate well, leg pains. Statin intolerance. Not interested in injectable. Now taking supplement.  2. Fatigue-certainly could be multifactorial. Reassuring heart cath 3. Old myocardial infarction-previously silent. 4. Hyperlipidemia - Did not tolerate Crestor, Livalo 2mg  twice a week. Continue to encourage diet and exercise. Could try Zetia but not interested.  5. Preop cardiovascular-if he does  need to go forward with left knee surgery, this seems reasonable at this point. He would be of low to moderate risk from a cardiac perspective. 6. One-year follow-up  Signed, Candee Furbish, MD [facility]  12/22/2016 10:58 AM

## 2016-12-22 NOTE — Patient Instructions (Signed)

## 2016-12-25 ENCOUNTER — Ambulatory Visit: Payer: Medicare Other | Admitting: Podiatry

## 2017-01-03 ENCOUNTER — Ambulatory Visit: Payer: Medicare Other | Admitting: Sports Medicine

## 2017-01-10 ENCOUNTER — Other Ambulatory Visit: Payer: Self-pay | Admitting: Internal Medicine

## 2017-01-10 DIAGNOSIS — R1030 Lower abdominal pain, unspecified: Secondary | ICD-10-CM | POA: Diagnosis not present

## 2017-01-10 DIAGNOSIS — R1011 Right upper quadrant pain: Secondary | ICD-10-CM | POA: Diagnosis not present

## 2017-01-10 DIAGNOSIS — R109 Unspecified abdominal pain: Secondary | ICD-10-CM | POA: Diagnosis not present

## 2017-01-10 DIAGNOSIS — R630 Anorexia: Secondary | ICD-10-CM | POA: Diagnosis not present

## 2017-01-11 ENCOUNTER — Ambulatory Visit
Admission: RE | Admit: 2017-01-11 | Discharge: 2017-01-11 | Disposition: A | Discharge status: Home / Self Care | Payer: Medicare Other | Source: Ambulatory Visit | Attending: Internal Medicine | Admitting: Internal Medicine

## 2017-01-11 DIAGNOSIS — R1011 Right upper quadrant pain: Secondary | ICD-10-CM | POA: Diagnosis not present

## 2017-01-19 DIAGNOSIS — R630 Anorexia: Secondary | ICD-10-CM | POA: Diagnosis not present

## 2017-01-19 DIAGNOSIS — R109 Unspecified abdominal pain: Secondary | ICD-10-CM | POA: Diagnosis not present

## 2017-01-19 DIAGNOSIS — R1011 Right upper quadrant pain: Secondary | ICD-10-CM | POA: Diagnosis not present

## 2017-01-19 DIAGNOSIS — R1030 Lower abdominal pain, unspecified: Secondary | ICD-10-CM | POA: Diagnosis not present

## 2017-01-23 DIAGNOSIS — L570 Actinic keratosis: Secondary | ICD-10-CM | POA: Diagnosis not present

## 2017-02-02 ENCOUNTER — Other Ambulatory Visit: Payer: Self-pay | Admitting: Cardiology

## 2017-02-09 DIAGNOSIS — J0100 Acute maxillary sinusitis, unspecified: Secondary | ICD-10-CM | POA: Diagnosis not present

## 2017-02-27 DIAGNOSIS — H9202 Otalgia, left ear: Secondary | ICD-10-CM | POA: Diagnosis not present

## 2017-02-27 DIAGNOSIS — R42 Dizziness and giddiness: Secondary | ICD-10-CM | POA: Diagnosis not present

## 2017-03-19 ENCOUNTER — Telehealth: Payer: Self-pay | Admitting: Cardiology

## 2017-03-19 NOTE — Telephone Encounter (Signed)
Pt aware he was given clearance when seen last.  He reports Velvet at [employer] is faxing over the clearance to be signed again.

## 2017-03-19 NOTE — Telephone Encounter (Signed)
New Message   Per pt [place] office has tried reaching out four weeks ago, and again today about getting surgical clearance for patient to have knee surgery. Per pt they instructed him to call. Pt is requesting call back

## 2017-03-20 NOTE — Telephone Encounter (Signed)
Follow Up:   Pt says he needs to talk tou you. He is concerned about a phone call he received about his surgery.

## 2017-03-20 NOTE — Telephone Encounter (Signed)
I have not received anything from [facility].  Called and left message for Velvet if they are needing clearance again to please fax it.

## 2017-03-20 NOTE — Telephone Encounter (Signed)
Spoke with patient who is aware I have left a message for [employer] to fax request for clearance if needed.

## 2017-03-22 NOTE — Telephone Encounter (Signed)
Received request for surgical clearance from [facility].  Will give to Dr [name].

## 2017-03-26 DIAGNOSIS — M1712 Unilateral primary osteoarthritis, left knee: Secondary | ICD-10-CM | POA: Diagnosis not present

## 2017-03-26 DIAGNOSIS — M1711 Unilateral primary osteoarthritis, right knee: Secondary | ICD-10-CM | POA: Diagnosis not present

## 2017-03-26 DIAGNOSIS — M170 Bilateral primary osteoarthritis of knee: Secondary | ICD-10-CM | POA: Diagnosis not present

## 2017-03-27 NOTE — Telephone Encounter (Signed)
Was completed and faxed 5/4

## 2017-04-13 DIAGNOSIS — M1712 Unilateral primary osteoarthritis, left knee: Secondary | ICD-10-CM | POA: Diagnosis not present

## 2017-04-27 NOTE — Patient Instructions (Addendum)
Sean Hammond  04/27/2017   Your procedure is scheduled on: 05/09/2017    Report to [facility] Main  Entrance Take [place]  elevators to 3rd floor to  [place] at    [place] AM.    Call this number if you have problems the morning of surgery [phone]    Remember: ONLY 1 PERSON MAY GO WITH YOU TO SHORT STAY TO GET  READY MORNING OF [place].  Do not eat food or drink liquids :After Midnight.     Take these medicines the morning of surgery with A SIP OF WATER: Metoprolol ( Toprol), PRotonix, Flomax                                 You may not have any metal on your body including hair pins and              piercings  Do not wear jewelry, , lotions, powders or perfumes, deodorant                     Men may shave face and neck.   Do not bring valuables to the hospital. [place].  Contacts, dentures or bridgework may not be worn into surgery.  Leave suitcase in the car. After surgery it may be brought to your room.     :                Please read over the following fact sheets you were given: _____________________________________________________________________             [facility] - Preparing for Surgery Before surgery, you can play an important role.  Because skin is not sterile, your skin needs to be as free of germs as possible.  You can reduce the number of germs on your skin by washing with CHG (chlorahexidine gluconate) soap before surgery.  CHG is an antiseptic cleaner which kills germs and bonds with the skin to continue killing germs even after washing. Please DO NOT use if you have an allergy to CHG or antibacterial soaps.  If your skin becomes reddened/irritated stop using the CHG and inform your nurse when you arrive at Short Stay. Do not shave (including legs and underarms) for at least 48 hours prior to the first CHG shower.  You may shave your face/neck. Please follow these  instructions carefully:  1.  Shower with CHG Soap the night before surgery and the  morning of Surgery.  2.  If you choose to wash your hair, wash your hair first as usual with your  normal  shampoo.  3.  After you shampoo, rinse your hair and body thoroughly to remove the  shampoo.                           4.  Use CHG as you would any other liquid soap.  You can apply chg directly  to the skin and wash                       Gently with a scrungie or clean washcloth.  5.  Apply the CHG Soap to your body ONLY  FROM THE NECK DOWN.   Do not use on face/ open                           Wound or open sores. Avoid contact with eyes, ears mouth and genitals (private parts).                       Wash face,  Genitals (private parts) with your normal soap.             6.  Wash thoroughly, paying special attention to the area where your surgery  will be performed.  7.  Thoroughly rinse your body with warm water from the neck down.  8.  DO NOT shower/wash with your normal soap after using and rinsing off  the CHG Soap.                9.  Pat yourself dry with a clean towel.            10.  Wear clean pajamas.            11.  Place clean sheets on your bed the night of your first shower and do not  sleep with pets. Day of Surgery : Do not apply any lotions/deodorants the morning of surgery.  Please wear clean clothes to the hospital/surgery center.  FAILURE TO FOLLOW THESE INSTRUCTIONS MAY RESULT IN THE CANCELLATION OF YOUR SURGERY PATIENT SIGNATURE_________________________________  NURSE SIGNATURE__________________________________  ________________________________________________________________________  WHAT IS A BLOOD TRANSFUSION? Blood Transfusion Information  A transfusion is the replacement of blood or some of its parts. Blood is made up of multiple cells which provide different functions.  Red blood cells carry oxygen and are used for blood loss replacement.  White blood cells fight against  infection.  Platelets control bleeding.  Plasma helps clot blood.  Other blood products are available for specialized needs, such as hemophilia or other clotting disorders. BEFORE THE TRANSFUSION  Who gives blood for transfusions?   Healthy volunteers who are fully evaluated to make sure their blood is safe. This is blood bank blood. Transfusion therapy is the safest it has ever been in the practice of medicine. Before blood is taken from a donor, a complete history is taken to make sure that person has no history of diseases nor engages in risky social behavior (examples are intravenous drug use or sexual activity with multiple partners). The donor's travel history is screened to minimize risk of transmitting infections, such as malaria. The donated blood is tested for signs of infectious diseases, such as HIV and hepatitis. The blood is then tested to be sure it is compatible with you in order to minimize the chance of a transfusion reaction. If you or a relative donates blood, this is often done in anticipation of surgery and is not appropriate for emergency situations. It takes many days to process the donated blood. RISKS AND COMPLICATIONS Although transfusion therapy is very safe and saves many lives, the main dangers of transfusion include:   Getting an infectious disease.  Developing a transfusion reaction. This is an allergic reaction to something in the blood you were given. Every precaution is taken to prevent this. The decision to have a blood transfusion has been considered carefully by your caregiver before blood is given. Blood is not given unless the benefits outweigh the risks. AFTER THE TRANSFUSION  Right after receiving a blood transfusion, you will usually feel much better  and more energetic. This is especially true if your red blood cells have gotten low (anemic). The transfusion raises the level of the red blood cells which carry oxygen, and this usually causes an energy  increase.  The nurse administering the transfusion will monitor you carefully for complications. HOME CARE INSTRUCTIONS  No special instructions are needed after a transfusion. You may find your energy is better. Speak with your caregiver about any limitations on activity for underlying diseases you may have. SEEK MEDICAL CARE IF:   Your condition is not improving after your transfusion.  You develop redness or irritation at the intravenous (IV) site. SEEK IMMEDIATE MEDICAL CARE IF:  Any of the following symptoms occur over the next 12 hours:  Shaking chills.  You have a temperature by mouth above 102 F (38.9 C), not controlled by medicine.  Chest, back, or muscle pain.  People around you feel you are not acting correctly or are confused.  Shortness of breath or difficulty breathing.  Dizziness and fainting.  You get a rash or develop hives.  You have a decrease in urine output.  Your urine turns a dark color or changes to pink, red, or brown. Any of the following symptoms occur over the next 10 days:  You have a temperature by mouth above 102 F (38.9 C), not controlled by medicine.  Shortness of breath.  Weakness after normal activity.  The white part of the eye turns yellow (jaundice).  You have a decrease in the amount of urine or are urinating less often.  Your urine turns a dark color or changes to pink, red, or brown. Document Released: 11/03/2000 Document Revised: 01/29/2012 Document Reviewed: 06/22/2008 ExitCare Patient Information 2014 [place].  _______________________________________________________________________  Incentive Spirometer  An incentive spirometer is a tool that can help keep your lungs clear and active. This tool measures how well you are filling your lungs with each breath. Taking long deep breaths may help reverse or decrease the chance of developing breathing (pulmonary) problems (especially infection) following:  A long  period of time when you are unable to move or be active. BEFORE THE PROCEDURE   If the spirometer includes an indicator to show your best effort, your nurse or respiratory therapist will set it to a desired goal.  If possible, sit up straight or lean slightly forward. Try not to slouch.  Hold the incentive spirometer in an upright position. INSTRUCTIONS FOR USE  1. Sit on the edge of your bed if possible, or sit up as far as you can in bed or on a chair. 2. Hold the incentive spirometer in an upright position. 3. Breathe out normally. 4. Place the mouthpiece in your mouth and seal your lips tightly around it. 5. Breathe in slowly and as deeply as possible, raising the piston or the ball toward the top of the column. 6. Hold your breath for 3-5 seconds or for as long as possible. Allow the piston or ball to fall to the bottom of the column. 7. Remove the mouthpiece from your mouth and breathe out normally. 8. Rest for a few seconds and repeat Steps 1 through 7 at least 10 times every 1-2 hours when you are awake. Take your time and take a few normal breaths between deep breaths. 9. The spirometer may include an indicator to show your best effort. Use the indicator as a goal to work toward during each repetition. 10. After each set of 10 deep breaths, practice coughing to be sure your  lungs are clear. If you have an incision (the cut made at the time of surgery), support your incision when coughing by placing a pillow or rolled up towels firmly against it. Once you are able to get out of bed, walk around indoors and cough well. You may stop using the incentive spirometer when instructed by your caregiver.  RISKS AND COMPLICATIONS  Take your time so you do not get dizzy or light-headed.  If you are in pain, you may need to take or ask for pain medication before doing incentive spirometry. It is harder to take a deep breath if you are having pain. AFTER USE  Rest and breathe slowly and  easily.  It can be helpful to keep track of a log of your progress. Your caregiver can provide you with a simple table to help with this. If you are using the spirometer at home, follow these instructions: [place] IF:   You are having difficultly using the spirometer.  You have trouble using the spirometer as often as instructed.  Your pain medication is not giving enough relief while using the spirometer.  You develop fever of 100.5 F (38.1 C) or higher. SEEK IMMEDIATE MEDICAL CARE IF:   You cough up bloody sputum that had not been present before.  You develop fever of 102 F (38.9 C) or greater.  You develop worsening pain at or near the incision site. MAKE SURE YOU:   Understand these instructions.  Will watch your condition.  Will get help right away if you are not doing well or get worse. Document Released: 03/19/2007 Document Revised: 01/29/2012 Document Reviewed: 05/20/2007 [facility] Patient Information 2014 [place], [place].   ________________________________________________________________________

## 2017-05-01 ENCOUNTER — Encounter (HOSPITAL_COMMUNITY)
Admission: RE | Admit: 2017-05-01 | Discharge: 2017-05-01 | Disposition: A | Discharge status: Home / Self Care | Payer: Medicare Other | Source: Ambulatory Visit | Attending: Orthopedic Surgery | Admitting: Orthopedic Surgery

## 2017-05-01 ENCOUNTER — Encounter (HOSPITAL_COMMUNITY): Payer: Self-pay

## 2017-05-01 ENCOUNTER — Ambulatory Visit (HOSPITAL_COMMUNITY)
Admission: RE | Admit: 2017-05-01 | Discharge: 2017-05-01 | Disposition: A | Discharge status: Home / Self Care | Payer: Medicare Other | Source: Ambulatory Visit | Attending: Surgical | Admitting: Surgical

## 2017-05-01 DIAGNOSIS — K219 Gastro-esophageal reflux disease without esophagitis: Secondary | ICD-10-CM | POA: Diagnosis not present

## 2017-05-01 DIAGNOSIS — Z01812 Encounter for preprocedural laboratory examination: Secondary | ICD-10-CM | POA: Insufficient documentation

## 2017-05-01 DIAGNOSIS — R001 Bradycardia, unspecified: Secondary | ICD-10-CM | POA: Insufficient documentation

## 2017-05-01 DIAGNOSIS — G4733 Obstructive sleep apnea (adult) (pediatric): Secondary | ICD-10-CM | POA: Insufficient documentation

## 2017-05-01 DIAGNOSIS — R51 Headache: Secondary | ICD-10-CM | POA: Diagnosis not present

## 2017-05-01 DIAGNOSIS — E782 Mixed hyperlipidemia: Secondary | ICD-10-CM | POA: Diagnosis not present

## 2017-05-01 DIAGNOSIS — E785 Hyperlipidemia, unspecified: Secondary | ICD-10-CM | POA: Insufficient documentation

## 2017-05-01 DIAGNOSIS — Z0001 Encounter for general adult medical examination with abnormal findings: Secondary | ICD-10-CM | POA: Diagnosis not present

## 2017-05-01 DIAGNOSIS — E559 Vitamin D deficiency, unspecified: Secondary | ICD-10-CM | POA: Diagnosis not present

## 2017-05-01 DIAGNOSIS — I219 Acute myocardial infarction, unspecified: Secondary | ICD-10-CM | POA: Diagnosis not present

## 2017-05-01 DIAGNOSIS — Z01810 Encounter for preprocedural cardiovascular examination: Secondary | ICD-10-CM | POA: Diagnosis not present

## 2017-05-01 DIAGNOSIS — R0602 Shortness of breath: Secondary | ICD-10-CM | POA: Diagnosis not present

## 2017-05-01 DIAGNOSIS — I252 Old myocardial infarction: Secondary | ICD-10-CM | POA: Insufficient documentation

## 2017-05-01 DIAGNOSIS — R630 Anorexia: Secondary | ICD-10-CM | POA: Diagnosis not present

## 2017-05-01 DIAGNOSIS — R1011 Right upper quadrant pain: Secondary | ICD-10-CM | POA: Diagnosis not present

## 2017-05-01 DIAGNOSIS — Z01818 Encounter for other preprocedural examination: Secondary | ICD-10-CM | POA: Diagnosis not present

## 2017-05-01 DIAGNOSIS — R918 Other nonspecific abnormal finding of lung field: Secondary | ICD-10-CM | POA: Diagnosis not present

## 2017-05-01 DIAGNOSIS — G8929 Other chronic pain: Secondary | ICD-10-CM | POA: Diagnosis not present

## 2017-05-01 DIAGNOSIS — R972 Elevated prostate specific antigen [PSA]: Secondary | ICD-10-CM | POA: Diagnosis not present

## 2017-05-01 DIAGNOSIS — J0100 Acute maxillary sinusitis, unspecified: Secondary | ICD-10-CM | POA: Diagnosis not present

## 2017-05-01 DIAGNOSIS — M170 Bilateral primary osteoarthritis of knee: Secondary | ICD-10-CM | POA: Diagnosis not present

## 2017-05-01 DIAGNOSIS — G4701 Insomnia due to medical condition: Secondary | ICD-10-CM | POA: Diagnosis not present

## 2017-05-01 DIAGNOSIS — G609 Hereditary and idiopathic neuropathy, unspecified: Secondary | ICD-10-CM | POA: Diagnosis not present

## 2017-05-01 DIAGNOSIS — Z79899 Other long term (current) drug therapy: Secondary | ICD-10-CM | POA: Diagnosis not present

## 2017-05-01 DIAGNOSIS — Z1389 Encounter for screening for other disorder: Secondary | ICD-10-CM | POA: Diagnosis not present

## 2017-05-01 HISTORY — DX: Other specified postprocedural states: R11.2

## 2017-05-01 HISTORY — DX: Family history of other specified conditions: Z84.89

## 2017-05-01 HISTORY — DX: Other specified postprocedural states: Z98.890

## 2017-05-01 LAB — ABO/RH: ABO/RH(D): O POS

## 2017-05-01 LAB — URINALYSIS, ROUTINE W REFLEX MICROSCOPIC
Bilirubin Urine: NEGATIVE
Glucose, UA: NEGATIVE mg/dL
Hgb urine dipstick: NEGATIVE
Ketones, ur: NEGATIVE mg/dL
Leukocytes, UA: NEGATIVE
Nitrite: NEGATIVE
Protein, ur: NEGATIVE mg/dL
Specific Gravity, Urine: 1.012 (ref 1.005–1.030)
pH: 6 (ref 5.0–8.0)

## 2017-05-01 LAB — SURGICAL PCR SCREEN
MRSA, PCR: NEGATIVE
Staphylococcus aureus: NEGATIVE

## 2017-05-01 LAB — APTT: aPTT: 39 seconds — ABNORMAL HIGH (ref 24–36)

## 2017-05-01 LAB — PROTIME-INR
INR: 0.93
Prothrombin Time: 12.4 seconds (ref 11.4–15.2)

## 2017-05-01 NOTE — Progress Notes (Signed)
PTT done 05/01/17 faxed via epic to Dr Gladstone Lighter.

## 2017-05-01 NOTE — Progress Notes (Signed)
CBC/DIFF and CMP done 05/01/2017 on chart

## 2017-05-02 NOTE — Progress Notes (Signed)
Final EKG done 05/01/17-epic

## 2017-05-08 ENCOUNTER — Other Ambulatory Visit: Payer: Self-pay | Admitting: Cardiology

## 2017-05-08 NOTE — H&P (Signed)
TOTAL KNEE ADMISSION H&P  Patient is being admitted for left total knee arthroplasty.  Subjective:  Chief Complaint:left knee pain.  HPI: Sean Hammond, 75 y.o. male, has a history of pain and functional disability in the left knee due to arthritis and has failed non-surgical conservative treatments for greater than 12 weeks to includeNSAID's and/or analgesics, corticosteriod injections, viscosupplementation injections, flexibility and strengthening excercises, weight reduction as appropriate and activity modification.  Onset of symptoms was gradual, starting >10 years ago with gradually worsening course since that time. The patient noted prior procedures on the knee to include  arthroscopy and menisectomy on the left knee(s).  Patient currently rates pain in the left knee(s) at 8 out of 10 with activity. Patient has night pain, worsening of pain with activity and weight bearing, pain that interferes with activities of daily living, pain with passive range of motion, crepitus and joint swelling.  Patient has evidence of periarticular osteophytes and joint space narrowing by imaging studies. There is no active infection.  Patient Active Problem List   Diagnosis Date Noted  . Allergy with anaphylaxis due to food 10/18/2015  . Near syncope 03/21/2015  . RUQ abdominal pain 03/21/2015  . Old MI (myocardial infarction) 12/02/2013  . Shortness of breath on exertion   . Hyperlipidemia   . Moderate obstructive sleep apnea   . Silent myocardial infarction ([place])   . Coronary atherosclerosis of native coronary artery 12/14/2011   Past Medical History:  Diagnosis Date  . Allergic rhinitis   . CAD (coronary artery disease)    a. s/p DES to LCx x 2 in 2013 // b. Myoview 7/15: inferior ischemia, EF 52%, low risk  //  c. LHC 6/17: LCx stents patent, no sig CAD elsewhere, EF 65% >> Med Rx  . Chronic headaches    "started  09/2011"  . ED (erectile dysfunction)   . Epicondylitis   . Epistaxis   . Family  history of adverse reaction to anesthesia    wife has nausea with anesthesia   . GERD (gastroesophageal reflux disease)   . History of echocardiogram    a. Echo 5/16: mild LVH, EF 55-60%, no RWMA, PASP 33 mmHg  . Hyperlipidemia   . Insomnia    , post  . Leukopenia   . Lung nodule    , Left upper  . Moderate obstructive sleep apnea    not used cpap since 2014 bought bed that head would elevate   . Osteoarthritis of hand   . PONV (postoperative nausea and vomiting)   . Prostatitis   . Silent myocardial infarction ([place])   . Skin cancer    head, ear, back    Past Surgical History:  Procedure Laterality Date  . BONE CYST EXCISION  ~ 2004   left knee  . CARDIAC CATHETERIZATION N/A 05/09/2016   Procedure: Left Heart Cath and Coronary Angiography;  Surgeon: Jerline Pain, MD;  Location: [facility];  Service: Cardiovascular;  Laterality: N/A;  . CHOLECYSTECTOMY N/A 03/23/2015   Procedure: LAPAROSCOPIC CHOLECYSTECTOMY;  Surgeon: Ralene Ok, MD;  Location: [facility] ORS;  Service: General;  Laterality: N/A;  . CORONARY ANGIOPLASTY WITH STENT PLACEMENT  12/14/11   "2"  . EYE SURGERY  ~ 2010   "both eyes; reshape pupil"  . KNEE ARTHROSCOPY  ` 2010   left  . LEFT HEART CATHETERIZATION WITH CORONARY ANGIOGRAM N/A 12/14/2011   Procedure: LEFT HEART CATHETERIZATION WITH CORONARY ANGIOGRAM;  Surgeon: Candee Furbish, MD;  Location: [facility] CATH  LAB;  Service: Cardiovascular;  Laterality: N/A;  . NASAL SINUS SURGERY  ~ 2002 & 2003  . PERCUTANEOUS CORONARY STENT INTERVENTION (PCI-S)  12/14/2011   Procedure: PERCUTANEOUS CORONARY STENT INTERVENTION (PCI-S);  Surgeon: Candee Furbish, MD;  Location: [facility] CATH LAB;  Service: Cardiovascular;;  . SKIN CANCER EXCISION     "back; head; ears"    Current Outpatient Prescriptions:  .  aspirin EC 81 MG tablet, Take 81 mg by mouth daily., Disp: , Rfl:  .  Cholecalciferol (VITAMIN D HIGH POTENCY) 1000 UNITS capsule, Take 1,000 Units by mouth daily., Disp: , Rfl:  .   diphenhydrAMINE (BENADRYL) 25 MG tablet, Take 25 mg by mouth every 6 (six) hours as needed for itching or allergies. , Disp: , Rfl:  .  EPIPEN 2-PAK 0.3 MG/0.3ML SOAJ injection, Inject 0.3 mg into the muscle once as needed (For anaphylaxis.). , Disp: , Rfl:  .  lidocaine (LIDODERM) 5 %, Place 1 patch onto the skin daily as needed (For pain.). Remove & Discard patch within 12 hours or as directed by MD, Disp: , Rfl:  .  metoprolol succinate (TOPROL-XL) 25 MG 24 hr tablet, TAKE 1 TABLET (25 MG TOTAL) BY MOUTH DAILY., Disp: 90 tablet, Rfl: 2 .  Multiple Vitamin (MULTIVITAMIN) capsule, Take 1 capsule by mouth daily., Disp: , Rfl:  .  nitroGLYCERIN (NITROSTAT) 0.4 MG SL tablet, PLACE 1 TABLET UNDER TONGUE EVERY 5 MINUTES AS NEEDED FOR CHEST PAIN, Disp: 25 tablet, Rfl: 3 .  OVER THE COUNTER MEDICATION, Place 2 drops into both eyes as needed (For eye irritation.). Allergy Eye Relief Drops, Disp: , Rfl:  .  OVER THE COUNTER MEDICATION, Take 60 mLs by mouth daily. Vemma Liquid Antioxidant Vitamin Supplement, Disp: , Rfl:  .  pantoprazole (PROTONIX) 40 MG tablet, TAKE 1 TABLET BY MOUTH DAILY AT 12 NOON *DRUG NOT COVERED BY INSURANCE*, Disp: 30 tablet, Rfl: 6 .  PREVIDENT 5000 SENSITIVE 1.1-5 % PSTE, Take 1 application by mouth 2 (two) times daily. , Disp: , Rfl: 6 .  tamsulosin (FLOMAX) 0.4 MG CAPS capsule, Take 0.4 mg by mouth daily., Disp: , Rfl: 0 .  traMADol (ULTRAM) 50 MG tablet, Take 50 mg by mouth every 6 (six) hours as needed for moderate pain. , Disp: , Rfl:   Allergies  Allergen Reactions  . Beef-Derived Products Shortness Of Breath and Rash  . Pork-Derived Products Shortness Of Breath and Rash  . Crestor [Rosuvastatin] Other (See Comments)    Muscle ache  . Percocet [Oxycodone-Acetaminophen] Itching  . Pitavastatin Other (See Comments)    Leg pain    Social History  Substance Use Topics  . Smoking status: Former Smoker    Packs/day: 2.00    Years: 12.00    Types: Cigarettes  . Smokeless  tobacco: Never Used     Comment: "quit smoking cigarettes 1975"  . Alcohol use Yes     Comment: "last beer 1969"    Family History  Problem Relation Age of Onset  . Heart attack Mother   . Hypertension Mother      Review of Systems  Constitutional: Positive for malaise/fatigue. Negative for chills, diaphoresis, fever and weight loss.  HENT: Negative.   Eyes: Negative.   Respiratory: Positive for shortness of breath. Negative for cough, hemoptysis, sputum production and wheezing.        SOB with exertion  Cardiovascular: Negative.   Gastrointestinal: Negative.   Genitourinary: Negative for dysuria, flank pain, frequency, hematuria and urgency.  Positive for urinary retention  Musculoskeletal: Positive for joint pain and myalgias. Negative for back pain, falls and neck pain.  Skin: Negative.   Neurological: Positive for dizziness. Negative for tingling, tremors, sensory change, speech change, focal weakness, seizures, loss of consciousness, weakness and headaches.  Endo/Heme/Allergies: Negative.   Psychiatric/Behavioral: Negative.     Objective:  Physical Exam  Constitutional: He is oriented to person, place, and time. He appears well-developed and well-nourished. No distress.  HENT:  Head: Normocephalic and atraumatic.  Right Ear: External ear normal.  Left Ear: External ear normal.  Nose: Nose normal.  Mouth/Throat: Oropharynx is clear and moist.  Eyes: Conjunctivae and EOM are normal.  Neck: Neck supple.  Cardiovascular: Normal rate, regular rhythm, normal heart sounds and intact distal pulses.   No murmur heard. Respiratory: Effort normal and breath sounds normal. No respiratory distress. He has no wheezes.  GI: Soft. Bowel sounds are normal. He exhibits no distension. There is no tenderness.  Musculoskeletal:       Right hip: Normal.       Left hip: Normal.       Right knee: Normal.       Left knee: He exhibits decreased range of motion and swelling. He  exhibits no effusion and no erythema. Tenderness found. Medial joint line and lateral joint line tenderness noted.  Neurological: He is alert and oriented to person, place, and time. He has normal strength. No sensory deficit.  Skin: No rash noted. He is not diaphoretic. No erythema.  Psychiatric: He has a normal mood and affect. His behavior is normal.   Vitals  Weight: 194 lb Height: 73in Body Surface Area: 2.12 m Body Mass Index: 25.59 kg/m  Pulse: 64 (Regular)  BP: 128/78 (Sitting, Left Arm, Standard)  Imaging Review Plain radiographs demonstrate severe degenerative joint disease of the left knee(s). The overall alignment ismild varus. The bone quality appears to be good for age and reported activity level.  Assessment/Plan:  End stage primary osteoarthritis, left knee   The patient history, physical examination, clinical judgment of the provider and imaging studies are consistent with end stage degenerative joint disease of the left knee(s) and total knee arthroplasty is deemed medically necessary. The treatment options including medical management, injection therapy arthroscopy and arthroplasty were discussed at length. The risks and benefits of total knee arthroplasty were presented and reviewed. The risks due to aseptic loosening, infection, stiffness, patella tracking problems, thromboembolic complications and other imponderables were discussed. The patient acknowledged the explanation, agreed to proceed with the plan and consent was signed. Patient is being admitted for inpatient treatment for surgery, pain control, PT, OT, prophylactic antibiotics, VTE prophylaxis, progressive ambulation and ADL's and discharge planning. The patient is planning to be discharged home with family with outpatient therapy at [facility]     PCP: Dr. Harolyn Rutherford Cardio: Dr. [name] Home with wife Therapy Plans: outpatient at [facility] starting 6/25 DME needs: Needs walker (given Rx by Sharee Pimple) Other  concerns: has had issues with urinary retention post-operatively; will need Flomax  Ardeen Jourdain, PA-C

## 2017-05-08 NOTE — Telephone Encounter (Signed)
Medication Detail    Disp Refills Start End   metoprolol succinate (TOPROL-XL) 25 MG 24 hr tablet 90 tablet 2 02/02/2017    Sig: TAKE 1 TABLET (25 MG TOTAL) BY MOUTH DAILY.   E-Prescribing Status: Receipt confirmed by pharmacy (02/02/2017 10:23 AM EDT)   Pharmacy   [id number] - [place], [place]

## 2017-05-09 ENCOUNTER — Inpatient Hospital Stay (HOSPITAL_COMMUNITY): Payer: Medicare Other | Admitting: Certified Registered Nurse Anesthetist

## 2017-05-09 ENCOUNTER — Encounter (HOSPITAL_COMMUNITY): Payer: Self-pay | Admitting: *Deleted

## 2017-05-09 ENCOUNTER — Encounter (HOSPITAL_COMMUNITY)
Admission: RE | Disposition: A | Discharge status: Home / Self Care | Payer: Self-pay | Source: Ambulatory Visit | Attending: Orthopedic Surgery

## 2017-05-09 ENCOUNTER — Inpatient Hospital Stay (HOSPITAL_COMMUNITY)
Admission: RE | Admit: 2017-05-09 | Discharge: 2017-05-11 | DRG: 470 | Disposition: A | Discharge status: Home / Self Care | Payer: Medicare Other | Source: Ambulatory Visit | Attending: Orthopedic Surgery | Admitting: Orthopedic Surgery

## 2017-05-09 DIAGNOSIS — G4733 Obstructive sleep apnea (adult) (pediatric): Secondary | ICD-10-CM | POA: Diagnosis present

## 2017-05-09 DIAGNOSIS — Z79899 Other long term (current) drug therapy: Secondary | ICD-10-CM | POA: Diagnosis not present

## 2017-05-09 DIAGNOSIS — I252 Old myocardial infarction: Secondary | ICD-10-CM

## 2017-05-09 DIAGNOSIS — Z7982 Long term (current) use of aspirin: Secondary | ICD-10-CM

## 2017-05-09 DIAGNOSIS — Z955 Presence of coronary angioplasty implant and graft: Secondary | ICD-10-CM | POA: Diagnosis not present

## 2017-05-09 DIAGNOSIS — E785 Hyperlipidemia, unspecified: Secondary | ICD-10-CM | POA: Diagnosis present

## 2017-05-09 DIAGNOSIS — G8918 Other acute postprocedural pain: Secondary | ICD-10-CM | POA: Diagnosis not present

## 2017-05-09 DIAGNOSIS — K219 Gastro-esophageal reflux disease without esophagitis: Secondary | ICD-10-CM | POA: Diagnosis present

## 2017-05-09 DIAGNOSIS — R339 Retention of urine, unspecified: Secondary | ICD-10-CM | POA: Diagnosis not present

## 2017-05-09 DIAGNOSIS — I2510 Atherosclerotic heart disease of native coronary artery without angina pectoris: Secondary | ICD-10-CM | POA: Diagnosis not present

## 2017-05-09 DIAGNOSIS — Z87891 Personal history of nicotine dependence: Secondary | ICD-10-CM | POA: Diagnosis not present

## 2017-05-09 DIAGNOSIS — Z96652 Presence of left artificial knee joint: Secondary | ICD-10-CM

## 2017-05-09 DIAGNOSIS — Z85828 Personal history of other malignant neoplasm of skin: Secondary | ICD-10-CM | POA: Diagnosis not present

## 2017-05-09 DIAGNOSIS — M25562 Pain in left knee: Secondary | ICD-10-CM | POA: Diagnosis not present

## 2017-05-09 DIAGNOSIS — M1712 Unilateral primary osteoarthritis, left knee: Secondary | ICD-10-CM | POA: Diagnosis not present

## 2017-05-09 DIAGNOSIS — M24562 Contracture, left knee: Secondary | ICD-10-CM | POA: Diagnosis not present

## 2017-05-09 HISTORY — PX: TOTAL KNEE ARTHROPLASTY: SHX125

## 2017-05-09 LAB — TYPE AND SCREEN
ABO/RH(D): O POS
Antibody Screen: NEGATIVE

## 2017-05-09 SURGERY — ARTHROPLASTY, KNEE, TOTAL
Anesthesia: General | Site: Knee | Laterality: Left

## 2017-05-09 MED ORDER — LIDOCAINE HCL (CARDIAC) 20 MG/ML IV SOLN
INTRAVENOUS | Status: DC | PRN
  Administered 2017-05-09: 50 mg via INTRAVENOUS

## 2017-05-09 MED ORDER — HYDROMORPHONE HCL 1 MG/ML IJ SOLN
0.2500 mg | INTRAMUSCULAR | Status: DC | PRN
  Administered 2017-05-09 (×2): 0.5 mg via INTRAVENOUS

## 2017-05-09 MED ORDER — TRANEXAMIC ACID 1000 MG/10ML IV SOLN
2000.0000 mg | Freq: Once | INTRAVENOUS | Status: DC
  Filled 2017-05-09: qty 20

## 2017-05-09 MED ORDER — FENTANYL CITRATE (PF) 100 MCG/2ML IJ SOLN
INTRAMUSCULAR | Status: AC
  Filled 2017-05-09: qty 2

## 2017-05-09 MED ORDER — TAMSULOSIN HCL 0.4 MG PO CAPS: 0.4000 mg | ORAL_CAPSULE | Freq: Every day | ORAL | 0 refills | Status: AC

## 2017-05-09 MED ORDER — LACTATED RINGERS IV SOLN
INTRAVENOUS | Status: DC
  Administered 2017-05-09 – 2017-05-10 (×2): via INTRAVENOUS

## 2017-05-09 MED ORDER — POLYMYXIN B SULFATE 500000 UNITS IJ SOLR
INTRAMUSCULAR | Status: AC
  Filled 2017-05-09: qty 500000

## 2017-05-09 MED ORDER — EPINEPHRINE 0.3 MG/0.3ML IJ SOAJ
0.3000 mg | Freq: Once | INTRAMUSCULAR | Status: DC | PRN
  Filled 2017-05-09: qty 0.3

## 2017-05-09 MED ORDER — ROPIVACAINE HCL 5 MG/ML IJ SOLN
INTRAMUSCULAR | Status: DC | PRN
  Administered 2017-05-09: 30 mL via PERINEURAL

## 2017-05-09 MED ORDER — PROMETHAZINE HCL 25 MG/ML IJ SOLN: 6.2500 mg | INTRAMUSCULAR | Status: DC | PRN

## 2017-05-09 MED ORDER — TAMSULOSIN HCL 0.4 MG PO CAPS
0.4000 mg | ORAL_CAPSULE | Freq: Every day | ORAL | Status: DC
  Administered 2017-05-10 – 2017-05-11 (×2): 0.4 mg via ORAL
  Filled 2017-05-09 (×2): qty 1

## 2017-05-09 MED ORDER — SODIUM CHLORIDE 0.9 % IR SOLN
Status: DC | PRN
  Administered 2017-05-09: 1000 mL

## 2017-05-09 MED ORDER — LACTATED RINGERS IV SOLN
INTRAVENOUS | Status: DC
  Administered 2017-05-09: 09:00:00 via INTRAVENOUS

## 2017-05-09 MED ORDER — FENTANYL CITRATE (PF) 250 MCG/5ML IJ SOLN
INTRAMUSCULAR | Status: DC | PRN
  Administered 2017-05-09 (×4): 50 ug via INTRAVENOUS

## 2017-05-09 MED ORDER — MENTHOL 3 MG MT LOZG: 1.0000 | LOZENGE | OROMUCOSAL | Status: DC | PRN

## 2017-05-09 MED ORDER — HYDROMORPHONE HCL 2 MG PO TABS
2.0000 mg | ORAL_TABLET | ORAL | Status: DC | PRN
  Administered 2017-05-09 (×2): 2 mg via ORAL
  Filled 2017-05-09 (×2): qty 1

## 2017-05-09 MED ORDER — METHOCARBAMOL 500 MG PO TABS
500.0000 mg | ORAL_TABLET | Freq: Four times a day (QID) | ORAL | Status: DC | PRN
  Administered 2017-05-09 – 2017-05-11 (×3): 500 mg via ORAL
  Filled 2017-05-09 (×3): qty 1

## 2017-05-09 MED ORDER — PHENOL 1.4 % MT LIQD: 1.0000 | OROMUCOSAL | Status: DC | PRN

## 2017-05-09 MED ORDER — BUPIVACAINE LIPOSOME 1.3 % IJ SUSP
INTRAMUSCULAR | Status: DC | PRN
  Administered 2017-05-09: 20 mL

## 2017-05-09 MED ORDER — PROPOFOL 10 MG/ML IV BOLUS
INTRAVENOUS | Status: AC
  Filled 2017-05-09: qty 20

## 2017-05-09 MED ORDER — METHOCARBAMOL 500 MG PO TABS: 500.0000 mg | ORAL_TABLET | Freq: Four times a day (QID) | ORAL | 0 refills | Status: DC | PRN

## 2017-05-09 MED ORDER — CELECOXIB 200 MG PO CAPS
200.0000 mg | ORAL_CAPSULE | Freq: Two times a day (BID) | ORAL | Status: DC
  Administered 2017-05-09 – 2017-05-11 (×4): 200 mg via ORAL
  Filled 2017-05-09 (×4): qty 1

## 2017-05-09 MED ORDER — MIDAZOLAM HCL 5 MG/5ML IJ SOLN
INTRAMUSCULAR | Status: DC | PRN
  Administered 2017-05-09 (×2): 1 mg via INTRAVENOUS

## 2017-05-09 MED ORDER — FERROUS SULFATE 325 (65 FE) MG PO TABS
325.0000 mg | ORAL_TABLET | Freq: Three times a day (TID) | ORAL | Status: DC
  Administered 2017-05-10 – 2017-05-11 (×3): 325 mg via ORAL
  Filled 2017-05-09 (×3): qty 1

## 2017-05-09 MED ORDER — BUPIVACAINE HCL (PF) 0.25 % IJ SOLN
INTRAMUSCULAR | Status: AC
  Filled 2017-05-09: qty 30

## 2017-05-09 MED ORDER — CEFAZOLIN SODIUM-DEXTROSE 2-4 GM/100ML-% IV SOLN
INTRAVENOUS | Status: AC
  Filled 2017-05-09: qty 100

## 2017-05-09 MED ORDER — ASPIRIN EC 325 MG PO TBEC: 325.0000 mg | DELAYED_RELEASE_TABLET | Freq: Two times a day (BID) | ORAL | 0 refills | Status: DC

## 2017-05-09 MED ORDER — METHOCARBAMOL 1000 MG/10ML IJ SOLN
500.0000 mg | Freq: Four times a day (QID) | INTRAVENOUS | Status: DC | PRN
  Administered 2017-05-09: 500 mg via INTRAVENOUS
  Filled 2017-05-09: qty 550

## 2017-05-09 MED ORDER — STERILE WATER FOR IRRIGATION IR SOLN
Status: DC | PRN
  Administered 2017-05-09: 2000 mL

## 2017-05-09 MED ORDER — ONDANSETRON HCL 4 MG PO TABS: 4.0000 mg | ORAL_TABLET | Freq: Four times a day (QID) | ORAL | Status: DC | PRN

## 2017-05-09 MED ORDER — PANTOPRAZOLE SODIUM 40 MG PO TBEC
40.0000 mg | DELAYED_RELEASE_TABLET | Freq: Every day | ORAL | Status: DC
  Administered 2017-05-10 – 2017-05-11 (×2): 40 mg via ORAL
  Filled 2017-05-09 (×2): qty 1

## 2017-05-09 MED ORDER — ONDANSETRON HCL 4 MG/2ML IJ SOLN
4.0000 mg | Freq: Four times a day (QID) | INTRAMUSCULAR | Status: DC | PRN
  Filled 2017-05-09: qty 2

## 2017-05-09 MED ORDER — HYDROMORPHONE HCL 2 MG PO TABS
2.0000 mg | ORAL_TABLET | ORAL | Status: DC | PRN
  Administered 2017-05-09 – 2017-05-11 (×10): 4 mg via ORAL
  Filled 2017-05-09 (×10): qty 2

## 2017-05-09 MED ORDER — MIDAZOLAM HCL 2 MG/2ML IJ SOLN
INTRAMUSCULAR | Status: AC
  Filled 2017-05-09: qty 2

## 2017-05-09 MED ORDER — BUPIVACAINE LIPOSOME 1.3 % IJ SUSP
20.0000 mL | Freq: Once | INTRAMUSCULAR | Status: DC
  Filled 2017-05-09 (×2): qty 20

## 2017-05-09 MED ORDER — CHLORHEXIDINE GLUCONATE 4 % EX LIQD: 60.0000 mL | Freq: Once | CUTANEOUS | Status: DC

## 2017-05-09 MED ORDER — HYDROMORPHONE HCL 2 MG PO TABS: 2.0000 mg | ORAL_TABLET | ORAL | 0 refills | Status: DC | PRN

## 2017-05-09 MED ORDER — ALUM & MAG HYDROXIDE-SIMETH 200-200-20 MG/5ML PO SUSP: 30.0000 mL | ORAL | Status: DC | PRN

## 2017-05-09 MED ORDER — EPHEDRINE SULFATE 50 MG/ML IJ SOLN
INTRAMUSCULAR | Status: DC | PRN
  Administered 2017-05-09: 10 mg via INTRAVENOUS

## 2017-05-09 MED ORDER — METOPROLOL SUCCINATE ER 25 MG PO TB24
25.0000 mg | ORAL_TABLET | Freq: Every day | ORAL | Status: DC
  Administered 2017-05-10: 10:00:00 25 mg via ORAL
  Filled 2017-05-09 (×2): qty 1

## 2017-05-09 MED ORDER — CEFAZOLIN SODIUM-DEXTROSE 1-4 GM/50ML-% IV SOLN
1.0000 g | Freq: Four times a day (QID) | INTRAVENOUS | Status: AC
  Administered 2017-05-09 (×2): 1 g via INTRAVENOUS
  Filled 2017-05-09 (×2): qty 50

## 2017-05-09 MED ORDER — NITROGLYCERIN 0.4 MG SL SUBL: 0.4000 mg | SUBLINGUAL_TABLET | SUBLINGUAL | Status: DC | PRN

## 2017-05-09 MED ORDER — SODIUM CHLORIDE 0.9 % IV SOLN
INTRAVENOUS | Status: DC | PRN
  Administered 2017-05-09: 500 mL

## 2017-05-09 MED ORDER — RIVAROXABAN 10 MG PO TABS
10.0000 mg | ORAL_TABLET | Freq: Every day | ORAL | Status: DC
  Administered 2017-05-10 – 2017-05-11 (×2): 10 mg via ORAL
  Filled 2017-05-09 (×2): qty 1

## 2017-05-09 MED ORDER — HYDROMORPHONE HCL 1 MG/ML IJ SOLN
1.0000 mg | INTRAMUSCULAR | Status: DC | PRN
  Administered 2017-05-09 (×2): 1 mg via INTRAVENOUS
  Filled 2017-05-09 (×2): qty 1

## 2017-05-09 MED ORDER — PROPOFOL 10 MG/ML IV BOLUS
INTRAVENOUS | Status: DC | PRN
  Administered 2017-05-09: 150 mg via INTRAVENOUS

## 2017-05-09 MED ORDER — ONDANSETRON HCL 4 MG/2ML IJ SOLN
INTRAMUSCULAR | Status: DC | PRN
  Administered 2017-05-09: 4 mg via INTRAVENOUS

## 2017-05-09 MED ORDER — CEFAZOLIN SODIUM-DEXTROSE 2-4 GM/100ML-% IV SOLN
2.0000 g | INTRAVENOUS | Status: AC
  Administered 2017-05-09: 2 g via INTRAVENOUS

## 2017-05-09 MED ORDER — HEMOSTATIC AGENTS (NO CHARGE) OPTIME
TOPICAL | Status: DC | PRN
  Administered 2017-05-09: 1 via TOPICAL

## 2017-05-09 SURGICAL SUPPLY — 65 items
AGENT HMST SPONGE THK3/8 (HEMOSTASIS) ×1
BAG DECANTER FOR FLEXI CONT (MISCELLANEOUS) ×2 IMPLANT
BAG SPEC THK2 15X12 ZIP CLS (MISCELLANEOUS) ×1
BAG ZIPLOCK 12X15 (MISCELLANEOUS) ×2 IMPLANT
BANDAGE ACE 4X5 VEL STRL LF (GAUZE/BANDAGES/DRESSINGS) ×3 IMPLANT
BANDAGE ACE 6X5 VEL STRL LF (GAUZE/BANDAGES/DRESSINGS) ×3 IMPLANT
BLADE SAG 18X100X1.27 (BLADE) ×3 IMPLANT
BLADE SAW SGTL 11.0X1.19X90.0M (BLADE) ×3 IMPLANT
BNDG GAUZE ELAST 4 BULKY (GAUZE/BANDAGES/DRESSINGS) ×6 IMPLANT
BONE CEMENT GENTAMICIN (Cement) ×6 IMPLANT
CAP KNEE TOTAL 3 SIGMA ×2 IMPLANT
CEMENT BONE GENTAMICIN 40 (Cement) ×2 IMPLANT
CLOSURE WOUND 1/2 X4 (GAUZE/BANDAGES/DRESSINGS) ×1
COVER SURGICAL LIGHT HANDLE (MISCELLANEOUS) ×3 IMPLANT
CUFF TOURN SGL QUICK 34 (TOURNIQUET CUFF) ×3
CUFF TRNQT CYL 34X4X40X1 (TOURNIQUET CUFF) ×1 IMPLANT
DECANTER SPIKE VIAL GLASS SM (MISCELLANEOUS) ×1 IMPLANT
DRAPE INCISE IOBAN 66X45 STRL (DRAPES) IMPLANT
DRAPE U-SHAPE 47X51 STRL (DRAPES) ×3 IMPLANT
DRSG ADAPTIC 3X8 NADH LF (GAUZE/BANDAGES/DRESSINGS) ×3 IMPLANT
DRSG PAD ABDOMINAL 8X10 ST (GAUZE/BANDAGES/DRESSINGS) ×6 IMPLANT
DURAPREP 26ML APPLICATOR (WOUND CARE) ×3 IMPLANT
ELECT REM PT RETURN 15FT ADLT (MISCELLANEOUS) ×3 IMPLANT
EVACUATOR 1/8 PVC DRAIN (DRAIN) ×3 IMPLANT
FACESHIELD WRAPAROUND (MASK) ×12 IMPLANT
FACESHIELD WRAPAROUND OR TEAM (MASK) ×1 IMPLANT
GAUZE SPONGE 4X4 12PLY STRL (GAUZE/BANDAGES/DRESSINGS) ×3 IMPLANT
GLOVE BIOGEL PI IND STRL 6.5 (GLOVE) ×1 IMPLANT
GLOVE BIOGEL PI IND STRL 8.5 (GLOVE) ×1 IMPLANT
GLOVE BIOGEL PI INDICATOR 6.5 (GLOVE) ×2
GLOVE BIOGEL PI INDICATOR 8.5 (GLOVE) ×2
GLOVE ECLIPSE 8.0 STRL XLNG CF (GLOVE) ×6 IMPLANT
GLOVE SURG SS PI 6.5 STRL IVOR (GLOVE) ×3 IMPLANT
GOWN STRL REUS W/TWL LRG LVL3 (GOWN DISPOSABLE) ×3 IMPLANT
GOWN STRL REUS W/TWL XL LVL3 (GOWN DISPOSABLE) ×3 IMPLANT
HANDPIECE INTERPULSE COAX TIP (DISPOSABLE) ×3
HEMOSTAT SPONGE AVITENE ULTRA (HEMOSTASIS) ×3 IMPLANT
IMMOBILIZER KNEE 20 (SOFTGOODS) ×3
IMMOBILIZER KNEE 20 THIGH 36 (SOFTGOODS) ×1 IMPLANT
MANIFOLD NEPTUNE II (INSTRUMENTS) ×3 IMPLANT
NDL HYPO 21X1.5 SAFETY (NEEDLE) IMPLANT
NEEDLE HYPO 21X1.5 SAFETY (NEEDLE) ×3 IMPLANT
NEEDLE HYPO 22GX1.5 SAFETY (NEEDLE) ×2 IMPLANT
NS IRRIG 1000ML POUR BTL (IV SOLUTION) ×2 IMPLANT
PACK TOTAL KNEE CUSTOM (KITS) ×3 IMPLANT
PADDING CAST COTTON 6X4 STRL (CAST SUPPLIES) ×3 IMPLANT
PENCIL SMOKE EVAC W/HOLSTER (ELECTROSURGICAL) ×3 IMPLANT
POSITIONER SURGICAL ARM (MISCELLANEOUS) ×3 IMPLANT
SET HNDPC FAN SPRY TIP SCT (DISPOSABLE) ×1 IMPLANT
SET PAD KNEE POSITIONER (MISCELLANEOUS) ×3 IMPLANT
SPONGE LAP 18X18 X RAY DECT (DISPOSABLE) IMPLANT
STRIP CLOSURE SKIN 1/2X4 (GAUZE/BANDAGES/DRESSINGS) ×3 IMPLANT
SUT BONE WAX W31G (SUTURE) ×2 IMPLANT
SUT MNCRL AB 4-0 PS2 18 (SUTURE) ×3 IMPLANT
SUT VIC AB 1 CT1 27 (SUTURE) ×6
SUT VIC AB 1 CT1 27XBRD ANTBC (SUTURE) ×2 IMPLANT
SUT VIC AB 2-0 CT1 27 (SUTURE) ×9
SUT VIC AB 2-0 CT1 TAPERPNT 27 (SUTURE) ×3 IMPLANT
SUT VLOC 180 0 24IN GS25 (SUTURE) ×3 IMPLANT
SYR 20CC LL (SYRINGE) ×6 IMPLANT
TOWER CARTRIDGE SMART MIX (DISPOSABLE) ×3 IMPLANT
TRAY FOLEY W/METER SILVER 16FR (SET/KITS/TRAYS/PACK) ×3 IMPLANT
WATER STERILE IRR 1500ML POUR (IV SOLUTION) ×5 IMPLANT
WRAP KNEE MAXI GEL POST OP (GAUZE/BANDAGES/DRESSINGS) ×3 IMPLANT
YANKAUER SUCT BULB TIP 10FT TU (MISCELLANEOUS) ×3 IMPLANT

## 2017-05-09 NOTE — Anesthesia Postprocedure Evaluation (Signed)
Anesthesia Post Note  Patient: Sean Hammond  Procedure(s) Performed: Procedure(s) (LRB): LEFT TOTAL KNEE ARTHROPLASTY (Left)     Patient location during evaluation: PACU Anesthesia Type: General and Regional Level of consciousness: awake and alert Pain management: pain level controlled Vital Signs Assessment: post-procedure vital signs reviewed and stable Respiratory status: spontaneous breathing, nonlabored ventilation, respiratory function stable and patient connected to nasal cannula oxygen Cardiovascular status: blood pressure returned to baseline and stable Postop Assessment: no signs of nausea or vomiting Anesthetic complications: no    Last Vitals:  Vitals:   05/09/17 1205 05/09/17 1305  BP: (!) 142/72 (!) 150/77  Pulse: 79 76  Resp: 13 16  Temp: 36.4 C 36.5 C    Last Pain:  Vitals:   05/09/17 1305  TempSrc: Oral  PainSc:                  [name] S

## 2017-05-09 NOTE — Evaluation (Signed)
Physical Therapy Evaluation Patient Details Name: Sean Hammond MRN: [id number] DOB: [birth year] Today's Date: 05/09/2017   History of Present Illness  pt s/p LTKA   Clinical Impression  Pt is s/p L TKA resulting in the deficits listed below (see PT Problem List). Pt will benefit from acute PT to increase their independence and safety with mobility to allow discharge home with family.      Follow Up Recommendations Outpatient PT;DC plan and follow up therapy as arranged by surgeon    Equipment Recommendations  None recommended by PT    Recommendations for Other Services       Precautions / Restrictions Precautions Precautions: Knee Required Braces or Orthoses: Knee Immobilizer - Left Knee Immobilizer - Left: On when out of bed or walking;Discontinue once straight leg raise with < 10 degree lag Restrictions Weight Bearing Restrictions: No      Mobility  Bed Mobility Overal bed mobility: Needs Assistance Bed Mobility: Supine to Sit;Sit to Supine     Supine to sit: Mod assist     General bed mobility comments: cues for how to perform   Transfers Overall transfer level: Needs assistance Equipment used: Rolling walker (2 wheeled) Transfers: Sit to/from Stand Sit to Stand: Min assist         General transfer comment: cues for RW safety and hand placement  Ambulation/Gait Ambulation/Gait assistance: Min assist Ambulation Distance (Feet): 20 Feet Assistive device: Rolling walker (2 wheeled) Gait Pattern/deviations: Step-to pattern     General Gait Details: cues for step to pattern adn sequence. Pt did get a little "funny feeling" with walking and subsided after sitting and elevating legs.   Stairs            Wheelchair Mobility    Modified Rankin (Stroke Patients Only)       Balance Overall balance assessment: No apparent balance deficits (not formally assessed)                                           Pertinent Vitals/Pain Pain  Assessment: 0-10 Pain Score: 8  Pain Location: L knee Pain Descriptors / Indicators: Aching Pain Intervention(s): Limited activity within patient's tolerance;Monitored during session;Repositioned;Ice applied (told nurse about ice and she was going to apply after our session and check on his pain)    [place] expects to be discharged to:: Private residence Living Arrangements: Spouse/significant other Available Help at Discharge: Family (dtr will assist as well ) Type of Home: House Home Access: Stairs to enter Entrance Stairs-Rails: Right Entrance Stairs-Number of Steps: 2-3 Home Layout: One [place]: [place] - 2 wheels Additional Comments: already had RW delivered to home, 3n1 was not needed per chart from MD office.     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 flexion limited with pain to about 40 , unable to perform SLR today without minimal assist        Communication   Communication: No difficulties  Cognition Arousal/Alertness: Awake/alert Behavior During Therapy: WFL for tasks assessed/performed Overall Cognitive Status: Within Functional Limits for tasks assessed  General Comments      Exercises Total Joint Exercises Ankle Circles/Pumps: AROM;Both;10 reps Quad Sets: AROM;Left;5 reps;Supine Heel Slides: AAROM;5 reps;Supine;Left Straight Leg Raises: AAROM;Left;5 reps;Supine Goniometric ROM: 10-40 grossly supine , limited iwth pain    Assessment/Plan    PT Assessment Patient needs continued PT services  PT Problem List Decreased strength;Decreased range of motion;Decreased mobility;Decreased knowledge of  precautions       PT Treatment Interventions DME instruction;Gait training;Stair training;Functional mobility training;Therapeutic activities;Therapeutic exercise;Patient/family education    PT Goals (Current goals can be found in the Care Plan section)  Acute Rehab PT Goals Patient Stated Goal: I want to do what is right and get better to go home  PT Goal Formulation: With patient/family Time For Goal Achievement: 05/23/17 Potential to Achieve Goals: Good    Frequency 7X/week   Barriers to discharge        Co-evaluation               AM-PAC PT "6 Clicks" Daily Activity  Outcome Measure Difficulty turning over in bed (including adjusting bedclothes, sheets and blankets)?: Total Difficulty moving from lying on back to sitting on the side of the bed? : Total Difficulty sitting down on and standing up from a chair with arms (e.g., wheelchair, bedside commode, etc,.)?: Total Help needed moving to and from a bed to chair (including a wheelchair)?: A Lot Help needed walking in hospital room?: A Little Help needed climbing 3-5 steps with a railing? : A Lot 6 Click Score: 10    End of Session Equipment Utilized During Treatment: Gait belt Activity Tolerance: Patient tolerated treatment well Patient left: in chair;with call bell/phone within reach;with family/visitor present Nurse Communication: Mobility status PT Visit Diagnosis: Other abnormalities of gait and mobility (R26.89)    Time: 6195-0932 PT Time Calculation (min) (ACUTE ONLY): 45 min   Charges:   PT Evaluation $PT Eval Low Complexity: 1 Procedure PT Treatments $Gait Training: 8-22 mins $Therapeutic Exercise: 8-22 mins   PT G Codes:        [name], PT Pager: [phone] 05/09/2017   [name], Gatha Mayer 05/09/2017, 6:58 PM

## 2017-05-09 NOTE — Brief Op Note (Signed)
05/09/2017  10:27 AM  PATIENT:  Sean Hammond  76 y.o. male  PRE-OPERATIVE DIAGNOSIS:  Left knee Primary  osteoarthritis   POST-OPERATIVE DIAGNOSIS:  Left knee Primary osteoarthritis   PROCEDURE:  Procedure(s): LEFT TOTAL KNEE ARTHROPLASTY (Left)  SURGEON:  Surgeon(s) and Role:    Latanya Maudlin, MD - Primary  PHYSICIAN ASSISTANT:Amber [place] PA   ASSISTANTS: Ardeen Jourdain PA   ANESTHESIA:   general  EBL:  No intake/output data recorded.  BLOOD ADMINISTERED:none  DRAINS: (One) Hemovact drain(s) in the Left Knee with  Suction Open   LOCAL MEDICATIONS USED:  OTHER 20cc of Exparel mixed with 20cc of Normal Saline  SPECIMEN:  No Specimen  DISPOSITION OF SPECIMEN:  N/A  COUNTS:  YES  TOURNIQUET:   Total Tourniquet Time Documented: Thigh (Left) - 71 minutes Total: Thigh (Left) - 71 minutes   DICTATION: .Other Dictation: Dictation Number [id number]  PLAN OF CARE: Admit to inpatient   PATIENT DISPOSITION:  Stable in OR   Delay start of Pharmacological VTE agent (>24hrs) due to surgical blood loss or risk of bleeding: yes

## 2017-05-09 NOTE — Interval H&P Note (Signed)
History and Physical Interval Note:  05/09/2017 8:23 AM  Sean Hammond  has presented today for surgery, with the diagnosis of Left knee osteoarthritis   The various methods of treatment have been discussed with the patient and family. After consideration of risks, benefits and other options for treatment, the patient has consented to  Procedure(s): LEFT TOTAL KNEE ARTHROPLASTY (Left) as a surgical intervention .  The patient's history has been reviewed, patient examined, no change in status, stable for surgery.  I have reviewed the patient's chart and labs.  Questions were answered to the patient's satisfaction.     [name]

## 2017-05-09 NOTE — Discharge Instructions (Signed)

## 2017-05-09 NOTE — Anesthesia Preprocedure Evaluation (Signed)
Anesthesia Evaluation  Patient identified by MRN, date of birth, ID band Patient awake    Reviewed: Allergy & Precautions, NPO status , Patient's Chart, lab work & pertinent test results  History of Anesthesia Complications (+) PONV  Airway Mallampati: II  TM Distance: >3 FB Neck ROM: Full    Dental no notable dental hx.    Pulmonary sleep apnea , former smoker,    Pulmonary exam normal breath sounds clear to auscultation       Cardiovascular + CAD, + Past MI and + Cardiac Stents  Normal cardiovascular exam Rhythm:Regular Rate:Normal     Neuro/Psych negative neurological ROS  negative psych ROS   GI/Hepatic negative GI ROS, Neg liver ROS,   Endo/Other  negative endocrine ROS  Renal/GU negative Renal ROS  negative genitourinary   Musculoskeletal negative musculoskeletal ROS (+)   Abdominal   Peds negative pediatric ROS (+)  Hematology negative hematology ROS (+)   Anesthesia Other Findings   Reproductive/Obstetrics negative OB ROS                             Anesthesia Physical Anesthesia Plan  ASA: III  Anesthesia Plan: General   Post-op Pain Management:  Regional for Post-op pain   Induction: Intravenous  PONV Risk Score and Plan: 1 and Ondansetron, Dexamethasone and Treatment may vary due to age or medical condition  Airway Management Planned: LMA and Oral ETT  Additional Equipment:   Intra-op Plan:   Post-operative Plan: Extubation in OR  Informed Consent: I have reviewed the patients History and Physical, chart, labs and discussed the procedure including the risks, benefits and alternatives for the proposed anesthesia with the patient or authorized representative who has indicated his/her understanding and acceptance.   Dental advisory given  Plan Discussed with: CRNA and Surgeon  Anesthesia Plan Comments:         Anesthesia Quick Evaluation

## 2017-05-09 NOTE — Anesthesia Procedure Notes (Signed)
Anesthesia Regional Block: Adductor canal block   Pre-Anesthetic Checklist: ,, timeout performed, Correct Patient, Correct Site, Correct Laterality, Correct Procedure, Correct Position, site marked, Risks and benefits discussed,  Surgical consent,  Pre-op evaluation,  At surgeon's request and post-op pain management  Laterality: Left  Prep: chloraprep       Needles:  Injection technique: Single-shot  Needle Type: Echogenic Needle     Needle Length: 9cm      Additional Needles:   Procedures: ultrasound guided,,,,,,,,  Narrative:  Start time: 05/09/2017 8:35 AM End time: 05/09/2017 8:41 AM Injection made incrementally with aspirations every 5 mL.  Performed by: Personally  Anesthesiologist: Nadiya Pieratt  Additional Notes: Patient tolerated the procedure well without complications

## 2017-05-09 NOTE — Anesthesia Procedure Notes (Signed)
Procedure Name: LMA Insertion Date/Time: 05/09/2017 8:57 AM Performed by: Chandra Batch A Pre-anesthesia Checklist: Patient identified, Emergency Drugs available, Suction available, Patient being monitored and Timeout performed Patient Re-evaluated:Patient Re-evaluated prior to inductionOxygen Delivery Method: Circle system utilized Preoxygenation: Pre-oxygenation with 100% oxygen Intubation Type: IV induction Ventilation: Mask ventilation without difficulty LMA: LMA flexible inserted LMA Size: 5.0 Number of attempts: 1 Placement Confirmation: positive ETCO2 and breath sounds checked- equal and bilateral Dental Injury: Teeth and Oropharynx as per pre-operative assessment

## 2017-05-09 NOTE — Progress Notes (Addendum)
Lives with spouse in a 1 story home with 3-4 steps. RW ordered prior to surgery, requesting a 3-in-1.Scheduled to start OP PT at [facility] on 05-14-17. Home with OPPT. [phone]

## 2017-05-09 NOTE — Op Note (Signed)
NAMERachael Hammond                   ACCOUNT NO.:  [id number]  MEDICAL RECORD NO.:  [id number]  LOCATION:                                 FACILITY:  PHYSICIAN:  Kipp Brood. Kordelia Severin, M.D.DATE OF BIRTH:  1941/03/20  DATE OF PROCEDURE:  05/09/2017 DATE OF DISCHARGE:                              OPERATIVE REPORT   SURGEON:  Kipp Brood. Gladstone Lighter, M.D.  ASSISTANT:  Ardeen Jourdain, [place].  PREOPERATIVE DIAGNOSES:  Severe primary osteoarthritis with bone-on-bone and flexion contracture.  POSTOPERATIVE DIAGNOSIS:  Severe primary osteoarthritis with bone-on- bone and flexion contracture.  OPERATION:  Left total knee arthroplasty utilizing the DePuy system, all 3 components were cemented.  The sizes used was a size 5 left posterior cruciate sacrificing femoral component.  The tibial tray was a size 5. The insert was a size 5, 12.5 mm thickness rotating platform insert. The patella was a size 41.  All 3 components were cemented, and Gentamicin was used in the cement.  The appropriate time-out was first carried out.  I also marked the appropriate left leg in the holding area.  After sterile prep and draping, we exsanguinated the left leg with Esmarch.  Tourniquet was elevated to 325 mmHg.  At this time, the knee was flexed.  It was placed in [facility] knee holder and an anterior approach to the knee was carried out.  Two flaps were created.  I then carried out a median parapatellar incision, reflected the patella laterally in the usual fashion.  I then did medial and lateral meniscectomies.  I excised the anterior and posterior cruciate ligaments.  Initial drill hole then was made in the intercondylar notch. The canal finer then was inserted up to canal.  I then thoroughly irrigated out the canal and then removed 11 mm thickness off the distal femur for a 5-degree valgus cut.  We then measured the femur to be a size 5.  We did anterior, posterior, chamfering cuts for the appropriate size femur.   Following that, we then prepared the tibia in usual fashion.  We removed the spinous processes first with the oscillating saw.  We measured the tray to be a size 5.  We made our initial drill hole at the tibial plateau, then inserted our canal finer, irrigated out the canal.  We then inserted our next guide, removed 4 mm thickness off the tibia using the affected medial side.  After this was done, we then inserted our lamina spreaders, removed the posterior spurs, and then inserted our gap wedges and felt that a 12.5 was the best fit. Following that, we then irrigated out the knee and then continued the procedure by cutting our keel cut out of the tibial plateau and then our distal femoral cut in the usual fashion.  Following that we then inserted our trial components.  We had an excellent fit with the 12.5-mm thickness insert.  We then resurfaced the patella for a size 41 patella. Three drill holes were made in the articular surface of the patella.  We then removed all trial components, water picked out the knee and cemented all 3 components and simultaneously utilizing gentamicin in the cement.  The knee motion was very nice at this point, we had good extension, good flexion, good medial and lateral stability.  After the cement was all hardened, we removed all loose pieces of cement, water picked the knee out again and then inserted a Hemovac drain and closed the knee in layers in usual fashion.  Sterile dressings were applied.  He had 2 g of IV Ancef preop. He did have 1 g of tranexamic acid.          ______________________________ Kipp Brood. Gladstone Lighter, M.D.     RAG/MEDQ  D:  05/09/2017  T:  05/09/2017  Job:  [id number]

## 2017-05-09 NOTE — Anesthesia Procedure Notes (Signed)
Anesthesia Procedure Image    

## 2017-05-09 NOTE — Transfer of Care (Signed)
Immediate Anesthesia Transfer of Care Note  Patient: Sean Hammond  Procedure(s) Performed: Procedure(s): LEFT TOTAL KNEE ARTHROPLASTY (Left)  Patient Location: PACU  Anesthesia Type:General  Level of Consciousness: awake, alert  and oriented  Airway & Oxygen Therapy: Patient Spontanous Breathing  Post-op Assessment: Report given to RN and Post -op Vital signs reviewed and stable  Post vital signs: Reviewed and stable  Last Vitals:  Vitals:   05/09/17 0613  BP: 139/81  Pulse: 71  Resp: 20  Temp: 36.4 C    Last Pain:  Vitals:   05/09/17 0625  TempSrc:   PainSc: 0-No pain      Patients Stated Pain Goal: 3 (95/63/87 [id number])  Complications: No apparent anesthesia complications

## 2017-05-10 LAB — BASIC METABOLIC PANEL
ANION GAP: 7 (ref 5–15)
BUN: 10 mg/dL (ref 6–20)
CALCIUM: 8.7 mg/dL — AB (ref 8.9–10.3)
CO2: 26 mmol/L (ref 22–32)
CREATININE: 0.96 mg/dL (ref 0.61–1.24)
Chloride: 103 mmol/L (ref 101–111)
GFR calc non Af Amer: >60 mL/min (ref >60)
Glucose, Bld: 146 mg/dL — ABNORMAL HIGH (ref 65–99)
Potassium: 3.9 mmol/L (ref 3.5–5.1)
SODIUM: 136 mmol/L (ref 135–145)

## 2017-05-10 LAB — CBC
HCT: 37.9 % — ABNORMAL LOW (ref 39.0–52.0)
Hemoglobin: 13.1 g/dL (ref 13.0–17.0)
MCH: 30.9 pg (ref 26.0–34.0)
MCHC: 34.6 g/dL (ref 30.0–36.0)
MCV: 89.4 fL (ref 78.0–100.0)
Platelets: 161 10*3/uL (ref 150–400)
RBC: 4.24 MIL/uL (ref 4.22–5.81)
RDW: 13.3 % (ref 11.5–15.5)
WBC: 9.9 10*3/uL (ref 4.0–10.5)

## 2017-05-10 NOTE — Progress Notes (Signed)
Pt unsuccessful with voiding trial and had urge to void. Bladder scan volume was 460 ml. Pt stated that he takes Flomax and had dose this morning prior to surgery. Pt did not want to have in and out catheter. Assisted patient to bathroom. He was able to void 100 ml that was measured and some urine spilled on to the floor. Pt stated that he no longer had urge to void. Will continue to monitor.

## 2017-05-10 NOTE — Evaluation (Signed)
Occupational Therapy Evaluation Patient Details Name: Sean Hammond MRN: [id number] DOB: [birth year] Today's Date: 05/10/2017    History of Present Illness pt s/p LTKA    Clinical Impression   Pt admitted with the above diagnoses and presents with below problem list. Pt will benefit from continued acute OT to address the below listed deficits and maximize independence with BADLs prior to d/c to venue below. PTA pt was independent with ADLs. Pt is currently min guard for LB ADLs and functional transfers/mobility; min guard to min A with bed mobility. Would benefit from follow up session to review tub transfer technique.       Follow Up Recommendations  DC plan and follow up therapy as arranged by surgeon    Equipment Recommendations  3 in 1 bedside commode;Other (comment) (Pt requesting 3n1 for home use. )    Recommendations for Other Services       Precautions / Restrictions Precautions Precautions: Knee Precaution Comments: reviewed keeping knee in extension at rest. Required Braces or Orthoses: Knee Immobilizer - Left Knee Immobilizer - Left: On when out of bed or walking;Discontinue once straight leg raise with < 10 degree lag Restrictions Weight Bearing Restrictions: No      Mobility Bed Mobility Overal bed mobility: Needs Assistance Bed Mobility: Supine to Sit     Supine to sit: Min guard;HOB elevated;Min assist     General bed mobility comments: Pt using BUE to advance LLE across bed. Likely min A to return to bed.   Transfers Overall transfer level: Needs assistance Equipment used: Rolling walker (2 wheeled) Transfers: Sit to/from Stand Sit to Stand: Min guard         General transfer comment: cues for RW safety and hand placement    Balance Overall balance assessment: No apparent balance deficits (not formally assessed)                                         ADL either performed or assessed with clinical judgement   ADL Overall  ADL's : Needs assistance/impaired Eating/Feeding: Set up;Sitting   Grooming: Min guard;Standing   Upper Body Bathing: Set up;Sitting   Lower Body Bathing: Min guard;Sit to/from stand   Upper Body Dressing : Set up;Sitting   Lower Body Dressing: Min guard;Sit to/from stand   Toilet Transfer: Min guard;Ambulation;BSC;RW   Toileting- Water quality scientist and Hygiene: Min guard;Sit to/from stand   Tub/ Shower Transfer: Tub transfer;Min guard;Ambulation;3 in 1   Functional mobility during ADLs: Min guard;Rolling walker General ADL Comments: Pt completed bed mobility, toilet transfer, and grooming task at sink. ADL education given.      Vision         Perception     Praxis      Pertinent Vitals/Pain Pain Assessment: Faces Faces Pain Scale: Hurts little more Pain Location: L knee Pain Descriptors / Indicators: Aching Pain Intervention(s): Limited activity within patient's tolerance;Monitored during session;Repositioned;Premedicated before session     Hand Dominance     Extremity/Trunk Assessment Upper Extremity Assessment Upper Extremity Assessment: Overall WFL for tasks assessed   Lower Extremity Assessment Lower Extremity Assessment: Defer to PT evaluation       Communication Communication Communication: No difficulties   Cognition Arousal/Alertness: Awake/alert Behavior During Therapy: WFL for tasks assessed/performed Overall Cognitive Status: Within Functional Limits for tasks assessed  General Comments       Exercises     Shoulder Instructions      Home Living Family/patient expects to be discharged to:: Private residence Living Arrangements: Spouse/significant other Available Help at Discharge: Family (dtr will assist as well ) Type of Home: House Home Access: Stairs to enter Entrance Stairs-Number of Steps: 2-3 Entrance Stairs-Rails: Right Home Layout: One level     Bathroom Shower/Tub:  Tub/shower unit         Home Equipment: Walker - 2 wheels   Additional Comments: Pt requesting 3n1 for home use.       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[place]         OT Problem List: Impaired balance (sitting and/or standing);Decreased knowledge of use of DME or AE;Decreased knowledge of precautions;Pain      OT Treatment/Interventions: Self-care/ADL training;DME and/or AE instruction;Therapeutic activities;Patient/family education;Balance training    OT Goals(Current goals can be found in the care plan section) Acute Rehab OT Goals Patient Stated Goal: I want to do what is right and get better to go home  OT Goal Formulation: With patient/family Time For Goal Achievement: 05/17/17 Potential to Achieve Goals: Good ADL Goals Pt Will Perform Grooming: with modified independence;standing Pt Will Perform Tub/Shower Transfer: Tub transfer;with supervision;ambulating;3 in 1;rolling walker Additional ADL Goal #1: Pt will complete bed mobility at mod I level to prepare for OOB ADLs.   OT Frequency: Min 2X/week   Barriers to D/C:            Co-evaluation              AM-PAC PT "6 Clicks" Daily Activity     Outcome Measure Help from another person eating meals?: None Help from another person taking care of personal grooming?: A Little Help from another person toileting, which includes using toliet, bedpan, or urinal?: A Little Help from another person bathing (including washing, rinsing, drying)?: A Little Help from another person to put on and taking off regular upper body clothing?: None Help from another person to put on and taking off regular lower body clothing?: A Little 6 Click Score: 20   End of Session Equipment Utilized During Treatment: Rolling walker;Other (comment) (Pt reports MD told him he does not need brace. Off in-room.)  Activity Tolerance: Patient tolerated treatment  well Patient left: in chair;with call bell/phone within reach;with family/visitor present  OT Visit Diagnosis: Unsteadiness on feet (R26.81);Pain Pain - Right/Left: Left Pain - part of body: Knee                Time: 0973-5329 OT Time Calculation (min): 24 min Charges:  OT General Charges $OT Visit: 1 Procedure OT Evaluation $OT Eval Low Complexity: 1 Procedure OT Treatments $Self Care/Home Management : 8-22 mins G-Codes:       Hortencia Pilar 05/10/2017, 10:38 AM

## 2017-05-10 NOTE — Progress Notes (Signed)
Physical Therapy Treatment Patient Details Name: Sean Hammond MRN: [id number] DOB: 1941/03/20 Today's Date: 05/10/2017    History of Present Illness pt s/p LTKA     PT Comments    Pt progressing well; states he had a rough night with pain but feeling better now and quite motivated to work with PT  Follow Up Recommendations  Outpatient PT;DC plan and follow up therapy as arranged by surgeon     Equipment Recommendations  None recommended by PT    Recommendations for Other Services       Precautions / Restrictions Precautions Precautions: Knee Required Braces or Orthoses: Knee Immobilizer - Left Knee Immobilizer - Left: On when out of bed or walking;Discontinue once straight leg raise with < 10 degree lag Restrictions Weight Bearing Restrictions: No Other Position/Activity Restrictions: WBAT    Mobility  Bed Mobility Overal bed mobility: Needs Assistance Bed Mobility: Sit to Supine       Sit to supine: Min guard   General bed mobility comments: guarding for LEs onto bed, but pt able to perform without assist  Transfers Overall transfer level: Needs assistance Equipment used: Rolling walker (2 wheeled) Transfers: Sit to/from Stand Sit to Stand: Min guard         General transfer comment: cues for RW safety and hand placement  Ambulation/Gait Ambulation/Gait assistance: Min guard Ambulation Distance (Feet): 120 Feet Assistive device: Rolling walker (2 wheeled) Gait Pattern/deviations: Step-to pattern     General Gait Details: cues for initial sequence & gait progression   Stairs            Wheelchair Mobility    Modified Rankin (Stroke Patients Only)       Balance                                            Cognition Arousal/Alertness: Awake/alert Behavior During Therapy: WFL for tasks assessed/performed Overall Cognitive Status: Within Functional Limits for tasks assessed                                         Exercises Total Joint Exercises Ankle Circles/Pumps: AROM;Both;10 reps Quad Sets: AROM;Strengthening;Both;10 reps Heel Slides: AAROM;Left;10 reps Straight Leg Raises: AAROM;Left;10 reps Goniometric ROM: grossly 10 to 60*     General Comments        Pertinent Vitals/Pain Pain Assessment: 0-10 Pain Score: 5  Pain Location: L knee Pain Descriptors / Indicators: Aching;Sore Pain Intervention(s): Limited activity within patient's tolerance;Monitored during session;Premedicated before session;Repositioned;Ice applied    Home Living                      Prior Function            PT Goals (current goals can now be found in the care plan section) Acute Rehab PT Goals Patient Stated Goal: I want to do what is right and get better to go home  PT Goal Formulation: With patient/family Time For Goal Achievement: 05/23/17 Potential to Achieve Goals: Good Progress towards PT goals: Progressing toward goals    Frequency    7X/week      PT Plan Current plan remains appropriate    Co-evaluation              AM-PAC PT "6 Clicks" Daily  Activity  Outcome Measure  Difficulty turning over in bed (including adjusting bedclothes, sheets and blankets)?: Total Difficulty moving from lying on back to sitting on the side of the bed? : A Little Difficulty sitting down on and standing up from a chair with arms (e.g., wheelchair, bedside commode, etc,.)?: A Little Help needed moving to and from a bed to chair (including a wheelchair)?: A Little Help needed walking in hospital room?: A Little Help needed climbing 3-5 steps with a railing? : A Lot 6 Click Score: 8    End of Session Equipment Utilized During Treatment: Gait belt;Left knee immobilizer Activity Tolerance: Patient tolerated treatment well Patient left: in bed;with call bell/phone within reach Nurse Communication: Mobility status PT Visit Diagnosis: Difficulty in walking, not elsewhere classified  (R26.2);Pain Pain - Right/Left: Left Pain - part of body: Knee     Time: 4098-1191 PT Time Calculation (min) (ACUTE ONLY): 24 min  Charges:  $Gait Training: 8-22 mins $Therapeutic Exercise: 8-22 mins                    G CodesKenyon Ana, PT Pager: [phone] 05/10/2017    Kenyon Ana 05/10/2017, 1:09 PM

## 2017-05-10 NOTE — Progress Notes (Signed)
Physical Therapy Treatment Patient Details Name: Sean Hammond MRN: [id number] DOB: Oct 19, 1941 Today's Date: 05/10/2017    History of Present Illness pt s/p LTKA     PT Comments    Pt progressing well; continue current POC  Follow Up Recommendations  Outpatient PT;DC plan and follow up therapy as arranged by surgeon     Equipment Recommendations  None recommended by PT    Recommendations for Other Services       Precautions / Restrictions Precautions Precautions: Knee Required Braces or Orthoses: Knee Immobilizer - Left Knee Immobilizer - Left: On when out of bed or walking;Discontinue once straight leg raise with < 10 degree lag Restrictions Weight Bearing Restrictions: No Other Position/Activity Restrictions: WBAT    Mobility  Bed Mobility Overal bed mobility: Needs Assistance Bed Mobility: Supine to Sit     Supine to sit: Min guard;Min assist Sit to supine: Min guard   General bed mobility comments: assist with LE  Transfers Overall transfer level: Needs assistance Equipment used: Rolling walker (2 wheeled) Transfers: Sit to/from Stand Sit to Stand: Min guard         General transfer comment: cues for RW safety and hand placement  Ambulation/Gait Ambulation/Gait assistance: Min guard Ambulation Distance (Feet): 130 Feet Assistive device: Rolling walker (2 wheeled) Gait Pattern/deviations: Step-to pattern;Step-through pattern;Decreased stride length     General Gait Details: cues for initial sequence & gait progression   Stairs            Wheelchair Mobility    Modified Rankin (Stroke Patients Only)       Balance                                            Cognition Arousal/Alertness: Awake/alert Behavior During Therapy: WFL for tasks assessed/performed Overall Cognitive Status: Within Functional Limits for tasks assessed                                        Exercises Total Joint  Exercises Ankle Circles/Pumps: AROM;Both;10 reps Quad Sets: AROM;Strengthening;Both;10 reps Heel Slides: AAROM;Left;10 reps Straight Leg Raises: AAROM;Left;10 reps Goniometric ROM: grossly 10 to 60*     General Comments        Pertinent Vitals/Pain Pain Assessment: 0-10 Pain Score: 4  Pain Location: L knee Pain Descriptors / Indicators: Aching;Sore Pain Intervention(s): Limited activity within patient's tolerance;Monitored during session;Premedicated before session    Home Living                      Prior Function            PT Goals (current goals can now be found in the care plan section) Acute Rehab PT Goals Patient Stated Goal: I want to do what is right and get better to go home  PT Goal Formulation: With patient/family Time For Goal Achievement: 05/23/17 Potential to Achieve Goals: Good Progress towards PT goals: Progressing toward goals    Frequency    7X/week      PT Plan Current plan remains appropriate    Co-evaluation              AM-PAC PT "6 Clicks" Daily Activity  Outcome Measure  Difficulty turning over in bed (including adjusting bedclothes, sheets and blankets)?: Total Difficulty moving  from lying on back to sitting on the side of the bed? : A Little Difficulty sitting down on and standing up from a chair with arms (e.g., wheelchair, bedside commode, etc,.)?: A Little Help needed moving to and from a bed to chair (including a wheelchair)?: A Little Help needed walking in hospital room?: A Little Help needed climbing 3-5 steps with a railing? : A Lot 6 Click Score: 15    End of Session Equipment Utilized During Treatment: Gait belt;Left knee immobilizer Activity Tolerance: Patient tolerated treatment well Patient left: Other (comment) (EOB so dtr could wash pt's back) Nurse Communication: Mobility status PT Visit Diagnosis: Difficulty in walking, not elsewhere classified (R26.2);Pain Pain - Right/Left: Left Pain - part of  body: Knee     Time: 1314-1340 PT Time Calculation (min) (ACUTE ONLY): 26 min  Charges:  $Gait Training: 8-22 mins $Therapeutic Exercise: 8-22 mins                    G Codes:          Demontray Franta 16-May-2017, 3:16 PM

## 2017-05-10 NOTE — Progress Notes (Signed)
Subjective: 1 Day Post-Op Procedure(s) (LRB): LEFT TOTAL KNEE ARTHROPLASTY (Left) Patient reports pain as 3 on 0-10 scale. Dressing change and wound looks fine. Hemovac DCd.Will ambulate and plan on DC tomorrow.   Objective: Vital signs in last 24 hours: Temp:  [97.4 F (36.3 C)-98.6 F (37 C)] 98.4 F (36.9 C) (06/21 0455) Pulse Rate:  [70-97] 78 (06/21 0455) Resp:  [13-18] 17 (06/21 0455) BP: (123-159)/(44-87) 123/80 (06/21 0455) SpO2:  [95 %-100 %] 97 % (06/21 0455)  Intake/Output from previous day: 06/20 0701 - 06/21 0700 In: 3588.3 [P.O.:720; I.V.:2763.3; IV Piggyback:105] Out: 3748 [Urine:1025; Drains:370; Blood:10] Intake/Output this shift: No intake/output data recorded.   Recent Labs  05/10/17 0421  HGB 13.1    Recent Labs  05/10/17 0421  WBC 9.9  RBC 4.24  HCT 37.9*  PLT 161    Recent Labs  05/10/17 0421  NA 136  K 3.9  CL 103  CO2 26  BUN 10  CREATININE 0.96  GLUCOSE 146*  CALCIUM 8.7*   No results for input(s): LABPT, INR in the last 72 hours.  Dorsiflexion/Plantar flexion intact No cellulitis present Compartment soft  Assessment/Plan: 1 Day Post-Op Procedure(s) (LRB): LEFT TOTAL KNEE ARTHROPLASTY (Left) Up with therapy  Railey Glad A 05/10/2017, 7:14 AM

## 2017-05-11 LAB — BASIC METABOLIC PANEL
ANION GAP: 5 (ref 5–15)
BUN: 17 mg/dL (ref 6–20)
CALCIUM: 8.9 mg/dL (ref 8.9–10.3)
CHLORIDE: 105 mmol/L (ref 101–111)
CO2: 29 mmol/L (ref 22–32)
Creatinine, Ser: 1.25 mg/dL — ABNORMAL HIGH (ref 0.61–1.24)
GFR calc non Af Amer: 55 mL/min — ABNORMAL LOW (ref >60)
Glucose, Bld: 124 mg/dL — ABNORMAL HIGH (ref 65–99)
Potassium: 4.8 mmol/L (ref 3.5–5.1)
SODIUM: 139 mmol/L (ref 135–145)

## 2017-05-11 LAB — CBC
HCT: 34.8 % — ABNORMAL LOW (ref 39.0–52.0)
Hemoglobin: 11.8 g/dL — ABNORMAL LOW (ref 13.0–17.0)
MCH: 31.5 pg (ref 26.0–34.0)
MCHC: 33.9 g/dL (ref 30.0–36.0)
MCV: 92.8 fL (ref 78.0–100.0)
Platelets: 151 10*3/uL (ref 150–400)
RBC: 3.75 MIL/uL — ABNORMAL LOW (ref 4.22–5.81)
RDW: 13.7 % (ref 11.5–15.5)
WBC: 9.1 10*3/uL (ref 4.0–10.5)

## 2017-05-11 MED ORDER — DIPHENHYDRAMINE HCL 25 MG PO CAPS: 25.0000 mg | ORAL_CAPSULE | Freq: Four times a day (QID) | ORAL | Status: DC | PRN

## 2017-05-11 NOTE — Progress Notes (Signed)
Occupational Therapy Treatment Patient Details Name: Sean Hammond MRN: [id number] DOB: [birth year] Today's Date: 05/11/2017    History of present illness pt s/p LTKA    OT comments  All education completed this session  Follow Up Recommendations  DC plan and follow up therapy as arranged by surgeon;Supervision/Assistance - 24 hour    Equipment Recommendations  3 in 1 bedside commode;Other (comment)    Recommendations for Other Services      Precautions / Restrictions Precautions Precautions: Knee Required Braces or Orthoses: Knee Immobilizer - Left Knee Immobilizer - Left: On when out of bed or walking;Discontinue once straight leg raise with < 10 degree lag Restrictions Weight Bearing Restrictions: No Other Position/Activity Restrictions: WBAT       Mobility Bed Mobility         Supine to sit: Min assist     General bed mobility comments: assist with LE  Transfers   Equipment used: Rolling walker (2 wheeled)   Sit to Stand: Supervision         General transfer comment: cues for waiting for RW and UE placement    Balance                                           ADL either performed or assessed with clinical judgement   ADL                           Toilet Transfer: Supervision/safety;Ambulation;BSC;RW             General ADL Comments: cues for safety with RW sidestepping through tight space in bathroom.  Pt had blood drip down leg from bottom of incision. Applied pressure and reapplied dressing. Called [place] to let RN know as RN was off the floor.  Demonstrated use of 3:1 in tub and recommended that pt sponge bathe initially     Vision       Perception     Praxis      Cognition Arousal/Alertness: Awake/alert Behavior During Therapy: WFL for tasks assessed/performed Overall Cognitive Status: Within Functional Limits for tasks assessed                                          Exercises      Shoulder Instructions       General Comments      Pertinent Vitals/ Pain       Pain Assessment: No/denies pain  Home Living                                          Prior Functioning/Environment              Frequency           Progress Toward Goals  OT Goals(current goals can now be found in the care plan section)  Progress towards OT goals: Progressing toward goals  Acute Rehab OT Goals Patient Stated Goal: I want to do what is right and get better to go home   Plan      Co-evaluation  AM-PAC PT "6 Clicks" Daily Activity     Outcome Measure   Help from another person eating meals?: None Help from another person taking care of personal grooming?: A Little Help from another person toileting, which includes using toliet, bedpan, or urinal?: A Little Help from another person bathing (including washing, rinsing, drying)?: A Little Help from another person to put on and taking off regular upper body clothing?: A Little Help from another person to put on and taking off regular lower body clothing?: A Little 6 Click Score: 19    End of Session    OT Visit Diagnosis: Unsteadiness on feet (R26.81);Pain Pain - Right/Left: Left Pain - part of body: Knee   Activity Tolerance Patient tolerated treatment well   Patient Left in chair;with call bell/phone within reach;with family/visitor present   Nurse Communication  (pt bleeding from bottom part of incision)        Time: 9597-4718 OT Time Calculation (min): 50 min  Charges: OT General Charges $OT Visit: 1 Procedure OT Treatments $Self Care/Home Management : 8-22 mins  Sean Hammond, OTR/L [phone] 05/11/2017   Sean Hammond 05/11/2017, 11:12 AM

## 2017-05-11 NOTE — Progress Notes (Signed)
Subjective: 2 Days Post-Op Procedure(s) (LRB): LEFT TOTAL KNEE ARTHROPLASTY (Left) Patient reports pain as 1 on 0-10 scale. Doing very well today. Will DC   Objective: Vital signs in last 24 hours: Temp:  [97.7 F (36.5 C)-98.3 F (36.8 C)] 97.7 F (36.5 C) (06/22 0520) Pulse Rate:  [77-91] 91 (06/22 0520) Resp:  [16] 16 (06/22 0520) BP: (101-112)/(55-63) 112/59 (06/22 0520) SpO2:  [96 %-100 %] 100 % (06/22 0520)  Intake/Output from previous day: 06/21 0701 - 06/22 0700 In: 831.7 [P.O.:680; I.V.:151.7] Out: 750 [Urine:750] Intake/Output this shift: No intake/output data recorded.   Recent Labs  05/10/17 0421 05/11/17 0448  HGB 13.1 11.8*    Recent Labs  05/10/17 0421 05/11/17 0448  WBC 9.9 9.1  RBC 4.24 3.75*  HCT 37.9* 34.8*  PLT 161 151    Recent Labs  05/10/17 0421 05/11/17 0448  NA 136 139  K 3.9 4.8  CL 103 105  CO2 26 29  BUN 10 17  CREATININE 0.96 1.25*  GLUCOSE 146* 124*  CALCIUM 8.7* 8.9   No results for input(s): LABPT, INR in the last 72 hours.  Dorsiflexion/Plantar flexion intact  Assessment/Plan: 2 Days Post-Op Procedure(s) (LRB): LEFT TOTAL KNEE ARTHROPLASTY (Left) Discharge Today  Adit Riddles A 05/11/2017, 7:04 AM

## 2017-05-11 NOTE — Progress Notes (Signed)
Physical Therapy Treatment Patient Details Name: Sean Hammond MRN: [id number] DOB: 12-30-1940 Today's Date: 05/11/2017    History of Present Illness pt s/p LTKA     PT Comments    Pt distal incision bleeding profusely while pt in bathroom with OT; assisted with pt cleaning pt and dressing change/light pressure dressing applied to stop bleeding and RN made aware; no further bleeding when re-check end of PT session; pt and dtr feel ready for d/C, see below for details  Follow Up Recommendations  Outpatient PT;DC plan and follow up therapy as arranged by surgeon     Equipment Recommendations  None recommended by PT    Recommendations for Other Services       Precautions / Restrictions Precautions Precautions: Knee Precaution Comments: pt able to do SLR IND today with ~10-15* lag; encouraged to utilize KI for stairs when at home Required Braces or Orthoses: Knee Immobilizer - Left Knee Immobilizer - Left: On when out of bed or walking;Discontinue once straight leg raise with < 10 degree lag Restrictions Weight Bearing Restrictions: No Other Position/Activity Restrictions: WBAT    Mobility  Bed Mobility         Supine to sit: Min assist     General bed mobility comments: up with OT  Transfers Overall transfer level: Needs assistance Equipment used: Rolling walker (2 wheeled) Transfers: Sit to/from Stand Sit to Stand: Supervision         General transfer comment: cues for waiting for RW and UE placement  Ambulation/Gait Ambulation/Gait assistance: Min guard;Supervision Ambulation Distance (Feet): 60 Feet Assistive device: Rolling walker (2 wheeled) Gait Pattern/deviations: Step-to pattern;Step-through pattern;Decreased stride length     General Gait Details: cues for initial sequence & gait progression   Stairs Stairs: Yes   Stair Management: One rail Right;One rail Left;Step to pattern;Sideways Number of Stairs: 3 General stair comments: cues for  sequence  Wheelchair Mobility    Modified Rankin (Stroke Patients Only)       Balance                                            Cognition Arousal/Alertness: Awake/alert Behavior During Therapy: WFL for tasks assessed/performed Overall Cognitive Status: Within Functional Limits for tasks assessed                                        Exercises Total Joint Exercises Ankle Circles/Pumps: AROM;Both;10 reps Quad Sets: AROM;Strengthening;Both;10 reps Short Arc Quad: AAROM;AROM;Strengthening;Left;10 reps Heel Slides: AAROM;Left;10 reps Hip ABduction/ADduction: AAROM;Strengthening;AROM;Left;10 reps Straight Leg Raises: AROM;AAROM;Strengthening;Left;10 reps Goniometric ROM: grossly 10 to 55*, AAROm knee flexion    General Comments        Pertinent Vitals/Pain Pain Assessment: No/denies pain    Home Living                      Prior Function            PT Goals (current goals can now be found in the care plan section) Acute Rehab PT Goals Patient Stated Goal: I want to do what is right and get better to go home  PT Goal Formulation: With patient/family Time For Goal Achievement: 05/23/17 Potential to Achieve Goals: Good Progress towards PT goals: Progressing toward goals    Frequency  7X/week      PT Plan Current plan remains appropriate    Co-evaluation              AM-PAC PT "6 Clicks" Daily Activity  Outcome Measure  Difficulty turning over in bed (including adjusting bedclothes, sheets and blankets)?: Total Difficulty moving from lying on back to sitting on the side of the bed? : A Little Difficulty sitting down on and standing up from a chair with arms (e.g., wheelchair, bedside commode, etc,.)?: A Little Help needed moving to and from a bed to chair (including a wheelchair)?: A Little Help needed walking in hospital room?: A Little Help needed climbing 3-5 steps with a railing? : A Lot 6 Click  Score: 15    End of Session Equipment Utilized During Treatment: Gait belt;Left knee immobilizer Activity Tolerance: Patient tolerated treatment well Patient left: in chair;with call bell/phone within reach;with family/visitor present Nurse Communication: Mobility status PT Visit Diagnosis: Difficulty in walking, not elsewhere classified (R26.2);Pain Pain - Right/Left: Left Pain - part of body: Knee     Time: 1791-5056 PT Time Calculation (min) (ACUTE ONLY): 50 min  Charges:  $Gait Training: 23-37 mins $Therapeutic Exercise: 8-22 mins                    G Codes:          Rankin Coolman May 29, 2017, 11:50 AM

## 2017-05-14 DIAGNOSIS — Z4733 Aftercare following explantation of knee joint prosthesis: Secondary | ICD-10-CM | POA: Diagnosis not present

## 2017-05-14 DIAGNOSIS — R278 Other lack of coordination: Secondary | ICD-10-CM | POA: Diagnosis not present

## 2017-05-14 DIAGNOSIS — R262 Difficulty in walking, not elsewhere classified: Secondary | ICD-10-CM | POA: Diagnosis not present

## 2017-05-14 DIAGNOSIS — M6281 Muscle weakness (generalized): Secondary | ICD-10-CM | POA: Diagnosis not present

## 2017-05-15 NOTE — Discharge Summary (Signed)
Physician Discharge Summary   Patient ID: GURTAJ RUZ MRN: [id number] DOB/AGE: Apr 01, 1941 76 y.o.  Admit date: 05/09/2017 Discharge date: 05/11/2017  Primary Diagnosis: Primary osteoarthritis left knee   Admission Diagnoses:  Past Medical History:  Diagnosis Date  . Allergic rhinitis   . CAD (coronary artery disease)    a. s/p DES to LCx x 2 in 2013 // b. Myoview 7/15: inferior ischemia, EF 52%, low risk  //  c. LHC 6/17: LCx stents patent, no sig CAD elsewhere, EF 65% >> Med Rx  . Chronic headaches    "started  09/2011"  . ED (erectile dysfunction)   . Epicondylitis   . Epistaxis   . Family history of adverse reaction to anesthesia    wife has nausea with anesthesia   . GERD (gastroesophageal reflux disease)   . History of echocardiogram    a. Echo 5/16: mild LVH, EF 55-60%, no RWMA, PASP 33 mmHg  . Hyperlipidemia   . Insomnia    , post  . Leukopenia   . Lung nodule    , Left upper  . Moderate obstructive sleep apnea    not used cpap since 2014 bought bed that head would elevate   . Osteoarthritis of hand   . PONV (postoperative nausea and vomiting)   . Prostatitis   . Silent myocardial infarction ([place])   . Skin cancer    head, ear, back   Discharge Diagnoses:   Active Problems:   H/O total knee replacement, left  Estimated body mass index is 25.6 kg/m as calculated from the following:   Height as of this encounter: 6' 1"  (1.854 m).   Weight as of this encounter: 88 kg (194 lb).  Procedure:  Procedure(s) (LRB): LEFT TOTAL KNEE ARTHROPLASTY (Left)   Consults: None  HPI: [name], 76 y.o. male, has a history of pain and functional disability in the left knee due to arthritis and has failed non-surgical conservative treatments for greater than 12 weeks to includeNSAID's and/or analgesics, corticosteriod injections, viscosupplementation injections, flexibility and strengthening excercises, weight reduction as appropriate and activity modification.  Onset of  symptoms was gradual, starting >10 years ago with gradually worsening course since that time. The patient noted prior procedures on the knee to include  arthroscopy and menisectomy on the left knee(s).  Patient currently rates pain in the left knee(s) at 8 out of 10 with activity. Patient has night pain, worsening of pain with activity and weight bearing, pain that interferes with activities of daily living, pain with passive range of motion, crepitus and joint swelling.  Patient has evidence of periarticular osteophytes and joint space narrowing by imaging studies. There is no active infection.  Laboratory Data: Admission on 05/09/2017, Discharged on 05/11/2017  Component Date Value Ref Range Status  . WBC 05/10/2017 9.9  4.0 - 10.5 K/uL Final  . RBC 05/10/2017 4.24  4.22 - 5.81 MIL/uL Final  . Hemoglobin 05/10/2017 13.1  13.0 - 17.0 g/dL Final  . HCT 05/10/2017 37.9* 39.0 - 52.0 % Final  . MCV 05/10/2017 89.4  78.0 - 100.0 fL Final  . MCH 05/10/2017 30.9  26.0 - 34.0 pg Final  . MCHC 05/10/2017 34.6  30.0 - 36.0 g/dL Final  . RDW 05/10/2017 13.3  11.5 - 15.5 % Final  . Platelets 05/10/2017 161  150 - 400 K/uL Final  . Sodium 05/10/2017 136  135 - 145 mmol/L Final  . Potassium 05/10/2017 3.9  3.5 - 5.1 mmol/L Final  . Chloride  05/10/2017 103  101 - 111 mmol/L Final  . CO2 05/10/2017 26  22 - 32 mmol/L Final  . Glucose, Bld 05/10/2017 146* 65 - 99 mg/dL Final  . BUN 05/10/2017 10  6 - 20 mg/dL Final  . Creatinine, Ser 05/10/2017 0.96  0.61 - 1.24 mg/dL Final  . Calcium 05/10/2017 8.7* 8.9 - 10.3 mg/dL Final  . GFR calc non Af Amer 05/10/2017 >60  >60 mL/min Final  . GFR calc Af Amer 05/10/2017 >60  >60 mL/min Final   Comment: (NOTE) The eGFR has been calculated using the CKD EPI equation. This calculation has not been validated in all clinical situations. eGFR's persistently <60 mL/min signify possible Chronic Kidney Disease.   . Anion gap 05/10/2017 7  5 - 15 Final  . WBC 05/11/2017  9.1  4.0 - 10.5 K/uL Final  . RBC 05/11/2017 3.75* 4.22 - 5.81 MIL/uL Final  . Hemoglobin 05/11/2017 11.8* 13.0 - 17.0 g/dL Final  . HCT 05/11/2017 34.8* 39.0 - 52.0 % Final  . MCV 05/11/2017 92.8  78.0 - 100.0 fL Final  . MCH 05/11/2017 31.5  26.0 - 34.0 pg Final  . MCHC 05/11/2017 33.9  30.0 - 36.0 g/dL Final  . RDW 05/11/2017 13.7  11.5 - 15.5 % Final  . Platelets 05/11/2017 151  150 - 400 K/uL Final  . Sodium 05/11/2017 139  135 - 145 mmol/L Final  . Potassium 05/11/2017 4.8  3.5 - 5.1 mmol/L Final   Comment: DELTA CHECK NOTED REPEATED TO VERIFY NO VISIBLE HEMOLYSIS   . Chloride 05/11/2017 105  101 - 111 mmol/L Final  . CO2 05/11/2017 29  22 - 32 mmol/L Final  . Glucose, Bld 05/11/2017 124* 65 - 99 mg/dL Final  . BUN 05/11/2017 17  6 - 20 mg/dL Final  . Creatinine, Ser 05/11/2017 1.25* 0.61 - 1.24 mg/dL Final  . Calcium 05/11/2017 8.9  8.9 - 10.3 mg/dL Final  . GFR calc non Af Amer 05/11/2017 55* >60 mL/min Final  . GFR calc Af Amer 05/11/2017 >60  >60 mL/min Final   Comment: (NOTE) The eGFR has been calculated using the CKD EPI equation. This calculation has not been validated in all clinical situations. eGFR's persistently <60 mL/min signify possible Chronic Kidney Disease.   Georgiann Hahn gap 05/11/2017 5  5 - 15 Final  Hospital Outpatient Visit on 05/01/2017  Component Date Value Ref Range Status  . aPTT 05/01/2017 39* 24 - 36 seconds Final   Comment:        IF BASELINE aPTT IS ELEVATED, SUGGEST PATIENT RISK ASSESSMENT BE USED TO DETERMINE APPROPRIATE ANTICOAGULANT THERAPY.   . Prothrombin Time 05/01/2017 12.4  11.4 - 15.2 seconds Final  . INR 05/01/2017 0.93   Final  . ABO/RH(D) 05/01/2017 O POS   Final  . Antibody Screen 05/01/2017 NEG   Final  . Sample Expiration 05/01/2017 05/12/2017   Final  . Extend sample reason 05/01/2017 NO TRANSFUSIONS OR PREGNANCY IN THE PAST 3 MONTHS   Final  . Color, Urine 05/01/2017 YELLOW  YELLOW Final  . APPearance 05/01/2017 CLEAR   CLEAR Final  . Specific Gravity, Urine 05/01/2017 1.012  1.005 - 1.030 Final  . pH 05/01/2017 6.0  5.0 - 8.0 Final  . Glucose, UA 05/01/2017 NEGATIVE  NEGATIVE mg/dL Final  . Hgb urine dipstick 05/01/2017 NEGATIVE  NEGATIVE Final  . Bilirubin Urine 05/01/2017 NEGATIVE  NEGATIVE Final  . Ketones, ur 05/01/2017 NEGATIVE  NEGATIVE mg/dL Final  . Protein, ur 05/01/2017 NEGATIVE  NEGATIVE mg/dL Final  . Nitrite 05/01/2017 NEGATIVE  NEGATIVE Final  . Leukocytes, UA 05/01/2017 NEGATIVE  NEGATIVE Final  . MRSA, PCR 05/01/2017 NEGATIVE  NEGATIVE Final  . Staphylococcus aureus 05/01/2017 NEGATIVE  NEGATIVE Final   Comment:        The Xpert SA Assay (FDA approved for NASAL specimens in patients over 65 years of age), is one component of a comprehensive surveillance program.  Test performance has been validated by [facility] for patients greater than or equal to 75 year old. It is not intended to diagnose infection nor to guide or monitor treatment.   . ABO/RH(D) 05/01/2017 O POS   Final     X-Rays:Dg Chest 2 View  Result Date: 05/01/2017 CLINICAL DATA:  Preoperative respiratory evaluation prior to left total knee arthroplasty. Prior coronary artery stenting in 2013. EXAM: CHEST  2 VIEW COMPARISON:  03/21/2015, 07/28/2011 and earlier, including CT chest 01/08/2013 and earlier. FINDINGS: Cardiac silhouette normal in size, unchanged. Left circumflex coronary artery stent again noted. Thoracic aorta minimally atherosclerotic, unchanged. Hilar and mediastinal contours otherwise unremarkable. Lungs clear. Bronchovascular markings normal. Pulmonary vascularity normal. No visible pleural effusions. No pneumothorax. Degenerative changes involving the thoracic and upper lumbar spine. IMPRESSION: No acute cardiopulmonary disease. Electronically Signed   By: Evangeline Dakin M.D.   On: 05/01/2017 14:38    EKG: Orders placed or performed during the hospital encounter of 05/01/17  . EKG  . EKG      Hospital Course: MOISE FRIDAY is a 76 y.o. who was admitted to [facility]. They were brought to the operating room on 05/09/2017 and underwent Procedure(s): LEFT TOTAL KNEE ARTHROPLASTY.  Patient tolerated the procedure well and was later transferred to the recovery room and then to the orthopaedic floor for postoperative care.  They were given PO and IV analgesics for pain control following their surgery.  They were given 24 hours of postoperative antibiotics of  Anti-infectives    Start     Dose/Rate Route Frequency Ordered Stop   05/09/17 1600  ceFAZolin (ANCEF) IVPB 1 g/50 mL premix     1 g 100 mL/hr over 30 Minutes Intravenous Every 6 hours 05/09/17 1213 05/09/17 2302   05/09/17 0927  polymyxin B 500,000 Units, bacitracin 50,000 Units in sodium chloride 0.9 % 500 mL irrigation  Status:  Discontinued       As needed 05/09/17 0927 05/09/17 1055   05/09/17 0830  ceFAZolin (ANCEF) 2-4 GM/100ML-% IVPB    Comments:  Chandra Batch   : cabinet override      05/09/17 0830 05/09/17 0900   05/09/17 0613  ceFAZolin (ANCEF) IVPB 2g/100 mL premix     2 g 200 mL/hr over 30 Minutes Intravenous On call to O.R. 05/09/17 4503 05/09/17 0900     and started on DVT prophylaxis in the form of Xarelto.   PT and OT were ordered for total joint protocol.  Discharge planning consulted to help with postop disposition and equipment needs.  Patient had a fair night on the evening of surgery.  They started to get up OOB with therapy on day one. Hemovac drain was pulled without difficulty.  Continued to work with therapy into day two.  Dressing was changed on day two and the incision was clean and dry. The patient had progressed with therapy and meeting their goals.  Incision was healing well.  Patient was seen in rounds and was ready to go home.   Diet: Cardiac diet Activity:WBAT Follow-up:in 2  weeks Disposition - Home Discharged Condition: stable   Discharge Instructions    Call MD / Call 911     Complete by:  As directed    If you experience chest pain or shortness of breath, CALL 911 and be transported to the hospital emergency room.  If you develope a fever above 101 F, pus (white drainage) or increased drainage or redness at the wound, or calf pain, call your surgeon's office.   Constipation Prevention    Complete by:  As directed    Drink plenty of fluids.  Prune juice may be helpful.  You may use a stool softener, such as Colace (over the counter) 100 mg twice a day.  Use MiraLax (over the counter) for constipation as needed.   Diet - low sodium heart healthy    Complete by:  As directed    Discharge instructions    Complete by:  As directed    INSTRUCTIONS AFTER JOINT REPLACEMENT   Remove items at home which could result in a fall. This includes throw rugs or furniture in walking pathways ICE to the affected joint every three hours while awake for 30 minutes at a time, for at least the first 3-5 days, and then as needed for pain and swelling.  Continue to use ice for pain and swelling. You may notice swelling that will progress down to the foot and ankle.  This is normal after surgery.  Elevate your leg when you are not up walking on it.   Continue to use the breathing machine you got in the hospital (incentive spirometer) which will help keep your temperature down.  It is common for your temperature to cycle up and down following surgery, especially at night when you are not up moving around and exerting yourself.  The breathing machine keeps your lungs expanded and your temperature down.   DIET:  As you were doing prior to hospitalization, we recommend a well-balanced diet.  DRESSING / WOUND CARE / SHOWERING  You may change your dressing every day with sterile gauze.  Please use good hand washing techniques before changing the dressing.  Do not use any lotions or creams on the incision until instructed by your surgeon.  ACTIVITY  Increase activity slowly as tolerated, but  follow the weight bearing instructions below.   No driving for 6 weeks or until further direction given by your physician.  You cannot drive while taking narcotics.  No lifting or carrying greater than 10 lbs. until further directed by your surgeon. Avoid periods of inactivity such as sitting longer than an hour when not asleep. This helps prevent blood clots.  You may return to work once you are authorized by your doctor.     WEIGHT BEARING   Weight bearing as tolerated with assist device (walker, cane, etc) as directed, use it as long as suggested by your surgeon or therapist, typically at least 4-6 weeks.   EXERCISES  Results after joint replacement surgery are often greatly improved when you follow the exercise, range of motion and muscle strengthening exercises prescribed by your doctor. Safety measures are also important to protect the joint from further injury. Any time any of these exercises cause you to have increased pain or swelling, decrease what you are doing until you are comfortable again and then slowly increase them. If you have problems or questions, call your caregiver or physical therapist for advice.   Rehabilitation is important following a joint replacement. After just a few days of  immobilization, the muscles of the leg can become weakened and shrink (atrophy).  These exercises are designed to build up the tone and strength of the thigh and leg muscles and to improve motion. Often times heat used for twenty to thirty minutes before working out will loosen up your tissues and help with improving the range of motion but do not use heat for the first two weeks following surgery (sometimes heat can increase post-operative swelling).   These exercises can be done on a training (exercise) mat, on the floor, on a table or on a bed. Use whatever works the best and is most comfortable for you.    Use music or television while you are exercising so that the exercises are a pleasant  break in your day. This will make your life better with the exercises acting as a break in your routine that you can look forward to.   Perform all exercises about fifteen times, three times per day or as directed.  You should exercise both the operative leg and the other leg as well.  Exercises include:   Quad Sets - Tighten up the muscle on the front of the thigh (Quad) and hold for 5-10 seconds.   Straight Leg Raises - With your knee straight (if you were given a brace, keep it on), lift the leg to 60 degrees, hold for 3 seconds, and slowly lower the leg.  Perform this exercise against resistance later as your leg gets stronger.  Leg Slides: Lying on your back, slowly slide your foot toward your buttocks, bending your knee up off the floor (only go as far as is comfortable). Then slowly slide your foot back down until your leg is flat on the floor again.  Angel Wings: Lying on your back spread your legs to the side as far apart as you can without causing discomfort.  Hamstring Strength:  Lying on your back, push your heel against the floor with your leg straight by tightening up the muscles of your buttocks.  Repeat, but this time bend your knee to a comfortable angle, and push your heel against the floor.  You may put a pillow under the heel to make it more comfortable if necessary.   A rehabilitation program following joint replacement surgery can speed recovery and prevent re-injury in the future due to weakened muscles. Contact your doctor or a physical therapist for more information on knee rehabilitation.    CONSTIPATION  Constipation is defined medically as fewer than three stools per week and severe constipation as less than one stool per week.  Even if you have a regular bowel pattern at home, your normal regimen is likely to be disrupted due to multiple reasons following surgery.  Combination of anesthesia, postoperative narcotics, change in appetite and fluid intake all can affect your  bowels.   YOU MUST use at least one of the following options; they are listed in order of increasing strength to get the job done.  They are all available over the counter, and you may need to use some, POSSIBLY even all of these options:    Drink plenty of fluids (prune juice may be helpful) and high fiber foods Colace 100 mg by mouth twice a day  Senokot for constipation as directed and as needed Dulcolax (bisacodyl), take with full glass of water  Miralax (polyethylene glycol) once or twice a day as needed.  If you have tried all these things and are unable to have a bowel movement in  the first 3-4 days after surgery call either your surgeon or your primary doctor.    If you experience loose stools or diarrhea, hold the medications until you stool forms back up.  If your symptoms do not get better within 1 week or if they get worse, check with your doctor.  If you experience "the worst abdominal pain ever" or develop nausea or vomiting, please contact the office immediately for further recommendations for treatment.   ITCHING:  If you experience itching with your medications, try taking only a single pain pill, or even half a pain pill at a time.  You can also use Benadryl over the counter for itching or also to help with sleep.   TED HOSE STOCKINGS:  Use stockings on both legs until for at least 2 weeks or as directed by physician office. They may be removed at night for sleeping.  MEDICATIONS:  See your medication summary on the "After Visit Summary" that nursing will review with you.  You may have some home medications which will be placed on hold until you complete the course of blood thinner medication.  It is important for you to complete the blood thinner medication as prescribed.  PRECAUTIONS:  If you experience chest pain or shortness of breath - call 911 immediately for transfer to the hospital emergency department.   If you develop a fever greater that 101 F, purulent drainage  from wound, increased redness or drainage from wound, foul odor from the wound/dressing, or calf pain - CONTACT YOUR SURGEON.                                                   FOLLOW-UP APPOINTMENTS:  If you do not already have a post-op appointment, please call the office for an appointment to be seen by your surgeon.  Guidelines for how soon to be seen are listed in your "After Visit Summary", but are typically between 1-4 weeks after surgery.   MAKE SURE YOU:  Understand these instructions.  Get help right away if you are not doing well or get worse.    Thank you for letting us be a part of your medical care team.  It is a privilege we respect greatly.  We hope these instructions will help you stay on track for a fast and full recovery!   Increase activity slowly as tolerated    Complete by:  As directed      Allergies as of 05/11/2017      Reactions   Beef-derived Products Shortness Of Breath, Rash   Pork-derived Products Shortness Of Breath, Rash   Crestor [rosuvastatin] Other (See Comments)   Muscle ache   Percocet [oxycodone-acetaminophen] Itching   Pitavastatin Other (See Comments)   Leg pain      Medication List    STOP taking these medications   multivitamin capsule   traMADol 50 MG tablet Commonly known as:  ULTRAM     TAKE these medications   aspirin EC 325 MG tablet Take 1 tablet (325 mg total) by mouth 2 (two) times daily. What changed:  medication strength  how much to take  when to take this   diphenhydrAMINE 25 MG tablet Commonly known as:  BENADRYL Take 25 mg by mouth every 6 (six) hours as needed for itching or allergies.   EPIPEN 2-PAK 0.3 mg/0.3 mL  Soaj injection Generic drug:  EPINEPHrine Inject 0.3 mg into the muscle once as needed (For anaphylaxis.).   HYDROmorphone 2 MG tablet Commonly known as:  DILAUDID Take 1 tablet (2 mg total) by mouth every 3 (three) hours as needed for moderate pain.   lidocaine 5 % Commonly known as:   LIDODERM Place 1 patch onto the skin daily as needed (For pain.). Remove & Discard patch within 12 hours or as directed by MD   methocarbamol 500 MG tablet Commonly known as:  ROBAXIN Take 1 tablet (500 mg total) by mouth every 6 (six) hours as needed for muscle spasms.   metoprolol succinate 25 MG 24 hr tablet Commonly known as:  TOPROL-XL TAKE 1 TABLET (25 MG TOTAL) BY MOUTH DAILY.   nitroGLYCERIN 0.4 MG SL tablet Commonly known as:  NITROSTAT PLACE 1 TABLET UNDER TONGUE EVERY 5 MINUTES AS NEEDED FOR CHEST PAIN   OVER THE COUNTER MEDICATION Place 2 drops into both eyes as needed (For eye irritation.). Allergy Eye Relief Drops   OVER THE COUNTER MEDICATION Take 60 mLs by mouth daily. Vemma Liquid Antioxidant Vitamin Supplement   pantoprazole 40 MG tablet Commonly known as:  PROTONIX TAKE 1 TABLET BY MOUTH DAILY AT 12 NOON *DRUG NOT COVERED BY INSURANCE*   PREVIDENT 5000 SENSITIVE 1.1-5 % Pste Generic drug:  Sod Fluoride-Potassium Nitrate Take 1 application by mouth 2 (two) times daily.   tamsulosin 0.4 MG Caps capsule Commonly known as:  FLOMAX Take 1 capsule (0.4 mg total) by mouth daily.   VITAMIN D HIGH POTENCY 1000 units capsule Generic drug:  Cholecalciferol Take 1,000 Units by mouth daily.      Follow-up Information    Latanya Maudlin, MD. Schedule an appointment as soon as possible for a visit in 2 week(s).   Specialty:  Orthopedic Surgery Contact information: [address] [place] [address] [phone]        [facility] - Dme Follow up.   Why:  bedside commode Contact information: [place] [address] [phone]           Signed: Ardeen Jourdain, PA-C Orthopaedic Surgery 05/15/2017, 12:28 PM

## 2017-05-16 DIAGNOSIS — Z4733 Aftercare following explantation of knee joint prosthesis: Secondary | ICD-10-CM | POA: Diagnosis not present

## 2017-05-16 DIAGNOSIS — R262 Difficulty in walking, not elsewhere classified: Secondary | ICD-10-CM | POA: Diagnosis not present

## 2017-05-16 DIAGNOSIS — R278 Other lack of coordination: Secondary | ICD-10-CM | POA: Diagnosis not present

## 2017-05-16 DIAGNOSIS — M6281 Muscle weakness (generalized): Secondary | ICD-10-CM | POA: Diagnosis not present

## 2017-05-18 DIAGNOSIS — R278 Other lack of coordination: Secondary | ICD-10-CM | POA: Diagnosis not present

## 2017-05-18 DIAGNOSIS — R262 Difficulty in walking, not elsewhere classified: Secondary | ICD-10-CM | POA: Diagnosis not present

## 2017-05-18 DIAGNOSIS — M6281 Muscle weakness (generalized): Secondary | ICD-10-CM | POA: Diagnosis not present

## 2017-05-18 DIAGNOSIS — Z4733 Aftercare following explantation of knee joint prosthesis: Secondary | ICD-10-CM | POA: Diagnosis not present

## 2017-05-21 DIAGNOSIS — R262 Difficulty in walking, not elsewhere classified: Secondary | ICD-10-CM | POA: Diagnosis not present

## 2017-05-21 DIAGNOSIS — Z4733 Aftercare following explantation of knee joint prosthesis: Secondary | ICD-10-CM | POA: Diagnosis not present

## 2017-05-21 DIAGNOSIS — M6281 Muscle weakness (generalized): Secondary | ICD-10-CM | POA: Diagnosis not present

## 2017-05-21 DIAGNOSIS — R278 Other lack of coordination: Secondary | ICD-10-CM | POA: Diagnosis not present

## 2017-05-23 DIAGNOSIS — Z4733 Aftercare following explantation of knee joint prosthesis: Secondary | ICD-10-CM | POA: Diagnosis not present

## 2017-05-23 DIAGNOSIS — R278 Other lack of coordination: Secondary | ICD-10-CM | POA: Diagnosis not present

## 2017-05-23 DIAGNOSIS — M6281 Muscle weakness (generalized): Secondary | ICD-10-CM | POA: Diagnosis not present

## 2017-05-23 DIAGNOSIS — R262 Difficulty in walking, not elsewhere classified: Secondary | ICD-10-CM | POA: Diagnosis not present

## 2017-05-24 DIAGNOSIS — Z96652 Presence of left artificial knee joint: Secondary | ICD-10-CM | POA: Diagnosis not present

## 2017-05-24 DIAGNOSIS — M1712 Unilateral primary osteoarthritis, left knee: Secondary | ICD-10-CM | POA: Diagnosis not present

## 2017-05-24 DIAGNOSIS — Z471 Aftercare following joint replacement surgery: Secondary | ICD-10-CM | POA: Diagnosis not present

## 2017-05-25 ENCOUNTER — Encounter (HOSPITAL_COMMUNITY): Payer: Self-pay | Admitting: Emergency Medicine

## 2017-05-25 ENCOUNTER — Emergency Department (HOSPITAL_COMMUNITY)
Admission: EM | Admit: 2017-05-25 | Discharge: 2017-05-25 | Disposition: A | Discharge status: Home / Self Care | Payer: Medicare Other | Attending: Emergency Medicine | Admitting: Emergency Medicine

## 2017-05-25 ENCOUNTER — Emergency Department (HOSPITAL_COMMUNITY): Payer: Medicare Other

## 2017-05-25 DIAGNOSIS — Z7982 Long term (current) use of aspirin: Secondary | ICD-10-CM | POA: Diagnosis not present

## 2017-05-25 DIAGNOSIS — I252 Old myocardial infarction: Secondary | ICD-10-CM | POA: Diagnosis not present

## 2017-05-25 DIAGNOSIS — I2510 Atherosclerotic heart disease of native coronary artery without angina pectoris: Secondary | ICD-10-CM | POA: Diagnosis not present

## 2017-05-25 DIAGNOSIS — Z87891 Personal history of nicotine dependence: Secondary | ICD-10-CM | POA: Diagnosis not present

## 2017-05-25 DIAGNOSIS — Z79899 Other long term (current) drug therapy: Secondary | ICD-10-CM | POA: Diagnosis not present

## 2017-05-25 DIAGNOSIS — R0609 Other forms of dyspnea: Secondary | ICD-10-CM | POA: Diagnosis not present

## 2017-05-25 DIAGNOSIS — R0602 Shortness of breath: Secondary | ICD-10-CM | POA: Insufficient documentation

## 2017-05-25 DIAGNOSIS — E785 Hyperlipidemia, unspecified: Secondary | ICD-10-CM | POA: Diagnosis not present

## 2017-05-25 LAB — BASIC METABOLIC PANEL
ANION GAP: 7 (ref 5–15)
BUN: 17 mg/dL (ref 6–20)
CHLORIDE: 104 mmol/L (ref 101–111)
CO2: 26 mmol/L (ref 22–32)
Calcium: 9.3 mg/dL (ref 8.9–10.3)
Creatinine, Ser: 1.17 mg/dL (ref 0.61–1.24)
Glucose, Bld: 125 mg/dL — ABNORMAL HIGH (ref 65–99)
POTASSIUM: 4.2 mmol/L (ref 3.5–5.1)
SODIUM: 137 mmol/L (ref 135–145)

## 2017-05-25 LAB — CBC
HCT: 37.7 % — ABNORMAL LOW (ref 39.0–52.0)
HEMOGLOBIN: 12.3 g/dL — AB (ref 13.0–17.0)
MCH: 30.2 pg (ref 26.0–34.0)
MCHC: 32.6 g/dL (ref 30.0–36.0)
MCV: 92.6 fL (ref 78.0–100.0)
PLATELETS: 452 10*3/uL — AB (ref 150–400)
RBC: 4.07 MIL/uL — AB (ref 4.22–5.81)
RDW: 13.6 % (ref 11.5–15.5)
WBC: 7 10*3/uL (ref 4.0–10.5)

## 2017-05-25 LAB — D-DIMER, QUANTITATIVE (NOT AT ARMC): D DIMER QUANT: 10.14 ug{FEU}/mL — AB (ref 0.00–0.50)

## 2017-05-25 MED ORDER — IOPAMIDOL (ISOVUE-370) INJECTION 76%
INTRAVENOUS | Status: AC
  Administered 2017-05-25: 60 mL
  Filled 2017-05-25: qty 100

## 2017-05-25 NOTE — ED Triage Notes (Signed)
Per [facility], Pt is coming from [facility] where he was seen for SOB. Pt was to be evaluated for possible PE. Requesting CT Angio.

## 2017-05-25 NOTE — ED Provider Notes (Signed)
[name] Provider Note   CSN: [id number] Arrival date & time: 05/25/17  1323     History   Chief Complaint Chief Complaint  Patient presents with  . Shortness of Breath    HPI Sean Hammond is a 76 y.o. male.  The history is provided by the patient.  Illness  This is a new problem. The current episode started more than 2 days ago. The problem occurs daily. The problem has not changed since onset.Associated symptoms include shortness of breath. Pertinent negatives include no chest pain, no abdominal pain and no headaches. Exacerbated by: Putting his clothes on. The symptoms are relieved by rest. He has tried nothing for the symptoms.    Past Medical History:  Diagnosis Date  . Allergic rhinitis   . CAD (coronary artery disease)    a. s/p DES to LCx x 2 in 2013 // b. Myoview 7/15: inferior ischemia, EF 52%, low risk  //  c. LHC 6/17: LCx stents patent, no sig CAD elsewhere, EF 65% >> Med Rx  . Chronic headaches    "started  09/2011"  . ED (erectile dysfunction)   . Epicondylitis   . Epistaxis   . Family history of adverse reaction to anesthesia    wife has nausea with anesthesia   . GERD (gastroesophageal reflux disease)   . History of echocardiogram    a. Echo 5/16: mild LVH, EF 55-60%, no RWMA, PASP 33 mmHg  . Hyperlipidemia   . Insomnia    , post  . Leukopenia   . Lung nodule    , Left upper  . Moderate obstructive sleep apnea    not used cpap since 2014 bought bed that head would elevate   . Osteoarthritis of hand   . PONV (postoperative nausea and vomiting)   . Prostatitis   . Silent myocardial infarction ([place])   . Skin cancer    head, ear, back    Patient Active Problem List   Diagnosis Date Noted  . H/O total knee replacement, left 05/09/2017  . Allergy with anaphylaxis due to food 10/18/2015  . Near syncope 03/21/2015  . RUQ abdominal pain 03/21/2015  . Old MI (myocardial infarction) 12/02/2013  . Shortness of breath on exertion   .  Hyperlipidemia   . Moderate obstructive sleep apnea   . Silent myocardial infarction ([place])   . Coronary atherosclerosis of native coronary artery 12/14/2011    Past Surgical History:  Procedure Laterality Date  . BONE CYST EXCISION  ~ 2004   left knee  . CARDIAC CATHETERIZATION N/A 05/09/2016   Procedure: Left Heart Cath and Coronary Angiography;  Surgeon: Jerline Pain, MD;  Location: [facility];  Service: Cardiovascular;  Laterality: N/A;  . CHOLECYSTECTOMY N/A 03/23/2015   Procedure: LAPAROSCOPIC CHOLECYSTECTOMY;  Surgeon: Ralene Ok, MD;  Location: [facility];  Service: General;  Laterality: N/A;  . CORONARY ANGIOPLASTY WITH STENT PLACEMENT  12/14/11   "2"  . EYE SURGERY  ~ 2010   "both eyes; reshape pupil"  . KNEE ARTHROSCOPY  ` 2010   left  . LEFT HEART CATHETERIZATION WITH CORONARY ANGIOGRAM N/A 12/14/2011   Procedure: LEFT HEART CATHETERIZATION WITH CORONARY ANGIOGRAM;  Surgeon: Candee Furbish, MD;  Location: [facility] CATH LAB;  Service: Cardiovascular;  Laterality: N/A;  . NASAL SINUS SURGERY  ~ 2002 & 2003  . PERCUTANEOUS CORONARY STENT INTERVENTION (PCI-S)  12/14/2011   Procedure: PERCUTANEOUS CORONARY STENT INTERVENTION (PCI-S);  Surgeon: Candee Furbish, MD;  Location: [facility] CATH LAB;  Service: Cardiovascular;;  . SKIN CANCER EXCISION     "back; head; ears"  . TOTAL KNEE ARTHROPLASTY Left 05/09/2017   Procedure: LEFT TOTAL KNEE ARTHROPLASTY;  Surgeon: Latanya Maudlin, MD;  Location: [facility];  Service: Orthopedics;  Laterality: Left;       Home Medications    Prior to Admission medications   Medication Sig Start Date End Date Taking? Authorizing Provider  aspirin EC 325 MG tablet Take 1 tablet (325 mg total) by mouth 2 (two) times daily. 05/09/17  Yes Constable, Museum/gallery conservator, PA-C  Cholecalciferol (VITAMIN D HIGH POTENCY) 1000 UNITS capsule Take 1,000 Units by mouth daily.   Yes [provider]  diphenhydrAMINE (BENADRYL) 25 MG tablet Take 25-50 mg by mouth every 6 (six) hours  as needed (for allergic reactions).    Yes [provider]  EPIPEN 2-PAK 0.3 MG/0.3ML SOAJ injection Inject 0.3 mg into the muscle once as needed (for an anaphylactic reaction).  03/17/14  Yes [provider]  lidocaine (LIDODERM) 5 % Place 1 patch onto the skin daily as needed (for knee pain). Remove & Discard patch within 12 hours or as directed by MD    Yes [provider]  methocarbamol (ROBAXIN) 500 MG tablet Take 1 tablet (500 mg total) by mouth every 6 (six) hours as needed for muscle spasms. 05/09/17  Yes Constable, Amber, PA-C  metoprolol succinate (TOPROL-XL) 25 MG 24 hr tablet TAKE 1 TABLET (25 MG TOTAL) BY MOUTH DAILY. 02/02/17  Yes Jerline Pain, MD  nitroGLYCERIN (NITROSTAT) 0.4 MG SL tablet PLACE 1 TABLET UNDER TONGUE EVERY 5 MINUTES AS NEEDED FOR CHEST PAIN 10/18/16  Yes Jerline Pain, MD  pantoprazole (PROTONIX) 40 MG tablet TAKE 1 TABLET BY MOUTH DAILY AT 12 NOON *DRUG NOT COVERED BY INSURANCE* Patient taking differently: Take 40 mg by mouth every other day 09/20/16  Yes Jerline Pain, MD  PREVIDENT 5000 SENSITIVE 3.4-1 % PSTE 1 application 2 (two) times daily.  11/24/15  Yes [provider]  tamsulosin (FLOMAX) 0.4 MG CAPS capsule Take 1 capsule (0.4 mg total) by mouth daily. 05/09/17  Yes Constable, Amber, PA-C  traMADol (ULTRAM) 50 MG tablet Take 50 mg by mouth every 4 (four) hours as needed (for pain).  04/25/17  Yes [provider]  HYDROmorphone (DILAUDID) 2 MG tablet Take 1 tablet (2 mg total) by mouth every 3 (three) hours as needed for moderate pain. Patient not taking: Reported on 05/25/2017 05/09/17   Ardeen Jourdain, PA-C    Family History Family History  Problem Relation Age of Onset  . Heart attack Mother   . Hypertension Mother     Social History Social History  Substance Use Topics  . Smoking status: Former Smoker    Packs/day: 2.00    Years: 12.00    Types: Cigarettes  . Smokeless tobacco: Never Used     Comment:  "quit smoking cigarettes 1975"  . Alcohol use Yes     Comment: "last beer 1969"     Allergies   Beef-derived products; Pork-derived products; Codeine; Crestor [rosuvastatin]; Dilaudid [hydromorphone hcl]; Hydrocodone; Lipitor [atorvastatin]; Percocet [oxycodone-acetaminophen]; and Pitavastatin   Review of Systems Review of Systems  Constitutional: Negative for chills and fever.  HENT: Negative for ear pain and sore throat.   Eyes: Negative for pain and visual disturbance.  Respiratory: Positive for shortness of breath. Negative for cough.   Cardiovascular: Negative for chest pain and palpitations.  Gastrointestinal: Negative for abdominal pain and vomiting.  Genitourinary: Negative for  dysuria and hematuria.  Musculoskeletal: Negative for arthralgias and back pain.  Skin: Negative for color change and rash.  Neurological: Negative for seizures, syncope and headaches.  All other systems reviewed and are negative.    Physical Exam Updated Vital Signs BP 128/75   Pulse 76   Temp 97.7 F (36.5 C)   Resp 12   Ht 6\' 1"  (1.854 m)   Wt 85.3 kg (188 lb)   SpO2 100%   BMI 24.80 kg/m   Physical Exam  Constitutional: He is oriented to person, place, and time. He appears well-developed and well-nourished.  HENT:  Head: Normocephalic and atraumatic.  Eyes: Conjunctivae and EOM are normal. Pupils are equal, round, and reactive to light.  Neck: Normal range of motion. Neck supple. No tracheal deviation present.  Cardiovascular: Normal rate, regular rhythm, normal heart sounds and intact distal pulses.   No murmur heard. Pulmonary/Chest: Effort normal and breath sounds normal. No respiratory distress. He has no wheezes. He has no rales. He exhibits no tenderness.  Abdominal: Soft. There is no tenderness.  Musculoskeletal: Normal range of motion. He exhibits no edema, tenderness or deformity.  Bilateral lower extremities symmetric with no swelling. All compartments of bilateral lower  extremities soft.  Neurological: He is alert and oriented to person, place, and time.  Skin: Skin is warm and dry.  Well-healed linear incision the anterior aspect of the left knee. Well approximated with no drainage or streaking.  Psychiatric: He has a normal mood and affect.  Nursing note and vitals reviewed.    ED Treatments / Results  Labs (all labs ordered are listed, but only abnormal results are displayed) Labs Reviewed  CBC - Abnormal; Notable for the following:       Result Value   RBC 4.07 (*)    Hemoglobin 12.3 (*)    HCT 37.7 (*)    Platelets 452 (*)    All other components within normal limits  BASIC METABOLIC PANEL - Abnormal; Notable for the following:    Glucose, Bld 125 (*)    All other components within normal limits  D-DIMER, QUANTITATIVE (NOT AT [facility]) - Abnormal; Notable for the following:    D-Dimer, Quant 10.14 (*)    All other components within normal limits    EKG  EKG Interpretation None       Radiology Dg Chest 2 View  Result Date: 05/25/2017 CLINICAL DATA:  Shortness of breath for 3 days EXAM: CHEST  2 VIEW COMPARISON:  05/01/2017 FINDINGS: The heart size and mediastinal contours are within normal limits. Both lungs are clear. The visualized skeletal structures are unremarkable. IMPRESSION: No active cardiopulmonary disease. Electronically Signed   By: Inez Catalina M.D.   On: 05/25/2017 14:34   Ct Angio Chest Pe W And/or Wo Contrast  Result Date: 05/25/2017 CLINICAL DATA:  76 year old male with acute shortness of breath for 3 days and elevated D-dimer. Knee replacement surgery 2 weeks ago. EXAM: CT ANGIOGRAPHY CHEST WITH CONTRAST TECHNIQUE: Multidetector CT imaging of the chest was performed using the standard protocol during bolus administration of intravenous contrast. Multiplanar CT image reconstructions and MIPs were obtained to evaluate the vascular anatomy. CONTRAST:  60 cc intravenous Isovue 370 COMPARISON:  05/25/2017 and prior chest  radiographs. 01/08/2013 and prior CTs FINDINGS: Cardiovascular: This is a technically satisfactory study. No pulmonary emboli are identified. Upper limits normal heart size noted. Mild coronary artery and thoracic aortic atherosclerotic calcifications noted. There is no evidence of thoracic aortic aneurysm or pericardial effusion.  Mediastinum/Nodes: No enlarged mediastinal, hilar, or axillary lymph nodes. Thyroid gland, trachea, and esophagus demonstrate no significant findings. Lungs/Pleura: Bilateral mosaic attenuation noted, greatest in the lower lobes - favor small airway disease over small-vessel disease or parenchymal abnormality. No consolidation, mass, suspicious nodule, pleural effusion or pneumothorax. Upper Abdomen: No acute abnormality Musculoskeletal: No chest wall abnormality. No acute or significant osseous findings. Review of the MIP images confirms the above findings. IMPRESSION: Moderate mosaic attenuation bilaterally, favor small airway disease over small-vessel disease or parenchymal abnormality. No evidence of pulmonary emboli or thoracic aortic aneurysm. Upper limits normal heart size and coronary artery disease. Aortic Atherosclerosis (ICD10-I70.0). Electronically Signed   By: Margarette Canada M.D.   On: 05/25/2017 20:26    Procedures Procedures (including critical care time)  Medications Ordered in ED Medications  iopamidol (ISOVUE-370) 76 % injection (60 mLs  Contrast Given 05/25/17 1955)     Initial Impression / Assessment and Plan / ED Course  I have reviewed the triage vital signs and the nursing notes.  Pertinent labs & imaging results that were available during my care of the patient were reviewed by me and considered in my medical decision making (see chart for details).     COBAIN MORICI is a 76 year old male with knee replacement surgery 10 days ago who is coming in today with shortness of breath. Patient was seen by his primary care provider earlier today and stated he  had some shortness of breath or getting dressed over the last 3-4 days but denies any exertional dyspnea while walking. Patient be sent here for further evaluation with concern for PE. Patient denies any hemoptysis, cough. No chest pain. Denies any leg pain, leg swelling.  On exam lungs are clear bilaterally chest x-ray shows no cardiopulmonary abnormalities. Heart rate in the 80s. Bilateral lower extremities are symmetric in size and nonswollen. 2+ DP pulses bilaterally. Not tachycardic, not tachypnea, satting 100% on room air. Patient with Rock Nephew lower risk for PE however considering his recent surgery and exertional dyspnea, we'll obtain d-dimer. D-dimer elevated at 10. CTA chest PE protocol ordered and shows no evidence of pulmonary emboli or thoracic aortic aneurysm.  Patient discharged in stable condition and given strict return precautions. He voiced understanding and agreement to the plan is coming with outpatient follow-up in the next week for his dyspnea. Vital signs still stable and within normal limits at time of discharge.  Patient was seen with my attending, Dr. Rogene Houston, who voiced agreement and oversaw the evaluation and treatment of this patient.   Dragon Field seismologist was used in the creation of this note. If there are any errors or inconsistencies needing clarification, please contact me directly.   Final Clinical Impressions(s) / ED Diagnoses   Final diagnoses:  Shortness of breath    New Prescriptions New Prescriptions   No medications on file     Valda Lamb, MD 05/25/17 2131    Fredia Sorrow, MD 05/25/17 2139

## 2017-05-25 NOTE — ED Triage Notes (Signed)
Reports SOB the last couple of days.  Had left knee replacement 2 weeks ago.  Physician sent here to check for blood clot.

## 2017-05-25 NOTE — ED Notes (Signed)
Patient transported to CT 

## 2017-05-28 DIAGNOSIS — R278 Other lack of coordination: Secondary | ICD-10-CM | POA: Diagnosis not present

## 2017-05-28 DIAGNOSIS — Z4733 Aftercare following explantation of knee joint prosthesis: Secondary | ICD-10-CM | POA: Diagnosis not present

## 2017-05-28 DIAGNOSIS — R262 Difficulty in walking, not elsewhere classified: Secondary | ICD-10-CM | POA: Diagnosis not present

## 2017-05-28 DIAGNOSIS — M6281 Muscle weakness (generalized): Secondary | ICD-10-CM | POA: Diagnosis not present

## 2017-05-30 DIAGNOSIS — M6281 Muscle weakness (generalized): Secondary | ICD-10-CM | POA: Diagnosis not present

## 2017-05-30 DIAGNOSIS — R262 Difficulty in walking, not elsewhere classified: Secondary | ICD-10-CM | POA: Diagnosis not present

## 2017-05-30 DIAGNOSIS — Z4733 Aftercare following explantation of knee joint prosthesis: Secondary | ICD-10-CM | POA: Diagnosis not present

## 2017-05-30 DIAGNOSIS — R278 Other lack of coordination: Secondary | ICD-10-CM | POA: Diagnosis not present

## 2017-05-31 DIAGNOSIS — R262 Difficulty in walking, not elsewhere classified: Secondary | ICD-10-CM | POA: Diagnosis not present

## 2017-05-31 DIAGNOSIS — Z4733 Aftercare following explantation of knee joint prosthesis: Secondary | ICD-10-CM | POA: Diagnosis not present

## 2017-05-31 DIAGNOSIS — R278 Other lack of coordination: Secondary | ICD-10-CM | POA: Diagnosis not present

## 2017-05-31 DIAGNOSIS — M6281 Muscle weakness (generalized): Secondary | ICD-10-CM | POA: Diagnosis not present

## 2017-06-02 ENCOUNTER — Emergency Department (HOSPITAL_BASED_OUTPATIENT_CLINIC_OR_DEPARTMENT_OTHER)
Admission: EM | Admit: 2017-06-02 | Discharge: 2017-06-02 | Disposition: A | Discharge status: Home / Self Care | Payer: Medicare Other | Attending: Emergency Medicine | Admitting: Emergency Medicine

## 2017-06-02 ENCOUNTER — Encounter (HOSPITAL_BASED_OUTPATIENT_CLINIC_OR_DEPARTMENT_OTHER): Payer: Self-pay | Admitting: *Deleted

## 2017-06-02 DIAGNOSIS — Z85828 Personal history of other malignant neoplasm of skin: Secondary | ICD-10-CM | POA: Diagnosis not present

## 2017-06-02 DIAGNOSIS — I252 Old myocardial infarction: Secondary | ICD-10-CM | POA: Diagnosis not present

## 2017-06-02 DIAGNOSIS — N3000 Acute cystitis without hematuria: Secondary | ICD-10-CM | POA: Diagnosis not present

## 2017-06-02 DIAGNOSIS — Z7982 Long term (current) use of aspirin: Secondary | ICD-10-CM | POA: Diagnosis not present

## 2017-06-02 DIAGNOSIS — Z79899 Other long term (current) drug therapy: Secondary | ICD-10-CM | POA: Diagnosis not present

## 2017-06-02 DIAGNOSIS — Z96652 Presence of left artificial knee joint: Secondary | ICD-10-CM | POA: Insufficient documentation

## 2017-06-02 DIAGNOSIS — I2510 Atherosclerotic heart disease of native coronary artery without angina pectoris: Secondary | ICD-10-CM | POA: Insufficient documentation

## 2017-06-02 DIAGNOSIS — Z87891 Personal history of nicotine dependence: Secondary | ICD-10-CM | POA: Diagnosis not present

## 2017-06-02 DIAGNOSIS — M545 Low back pain, unspecified: Secondary | ICD-10-CM

## 2017-06-02 LAB — URINALYSIS, ROUTINE W REFLEX MICROSCOPIC
Bilirubin Urine: NEGATIVE
GLUCOSE, UA: NEGATIVE mg/dL
Hgb urine dipstick: NEGATIVE
Ketones, ur: NEGATIVE mg/dL
Nitrite: NEGATIVE
PROTEIN: NEGATIVE mg/dL
SPECIFIC GRAVITY, URINE: 1.016 (ref 1.005–1.030)
pH: 6 (ref 5.0–8.0)

## 2017-06-02 LAB — CBC WITH DIFFERENTIAL/PLATELET
BASOS ABS: 0 10*3/uL (ref 0.0–0.1)
Basophils Relative: 0 %
Eosinophils Absolute: 0.1 10*3/uL (ref 0.0–0.7)
Eosinophils Relative: 1 %
HEMATOCRIT: 37.4 % — AB (ref 39.0–52.0)
Hemoglobin: 12.9 g/dL — ABNORMAL LOW (ref 13.0–17.0)
LYMPHS PCT: 12 %
Lymphs Abs: 1 10*3/uL (ref 0.7–4.0)
MCH: 31.5 pg (ref 26.0–34.0)
MCHC: 34.5 g/dL (ref 30.0–36.0)
MCV: 91.4 fL (ref 78.0–100.0)
MONO ABS: 1 10*3/uL (ref 0.1–1.0)
Monocytes Relative: 11 %
NEUTROS ABS: 6.7 10*3/uL (ref 1.7–7.7)
Neutrophils Relative %: 76 %
Platelets: 227 10*3/uL (ref 150–400)
RBC: 4.09 MIL/uL — ABNORMAL LOW (ref 4.22–5.81)
RDW: 13.2 % (ref 11.5–15.5)
WBC: 8.8 10*3/uL (ref 4.0–10.5)

## 2017-06-02 LAB — COMPREHENSIVE METABOLIC PANEL
ALT: 11 U/L — AB (ref 17–63)
AST: 21 U/L (ref 15–41)
Albumin: 3.6 g/dL (ref 3.5–5.0)
Alkaline Phosphatase: 66 U/L (ref 38–126)
Anion gap: 7 (ref 5–15)
BILIRUBIN TOTAL: 0.9 mg/dL (ref 0.3–1.2)
BUN: 13 mg/dL (ref 6–20)
CALCIUM: 8.9 mg/dL (ref 8.9–10.3)
CO2: 26 mmol/L (ref 22–32)
CREATININE: 1.07 mg/dL (ref 0.61–1.24)
Chloride: 103 mmol/L (ref 101–111)
GFR calc Af Amer: >60 mL/min (ref >60)
Glucose, Bld: 118 mg/dL — ABNORMAL HIGH (ref 65–99)
POTASSIUM: 4.5 mmol/L (ref 3.5–5.1)
Sodium: 136 mmol/L (ref 135–145)
TOTAL PROTEIN: 6.6 g/dL (ref 6.5–8.1)

## 2017-06-02 LAB — URINALYSIS, MICROSCOPIC (REFLEX)

## 2017-06-02 LAB — LIPASE, BLOOD: LIPASE: 21 U/L (ref 11–51)

## 2017-06-02 MED ORDER — CYCLOBENZAPRINE HCL 10 MG PO TABS
10.0000 mg | ORAL_TABLET | Freq: Once | ORAL | Status: AC
  Administered 2017-06-02: 10 mg via ORAL
  Filled 2017-06-02: qty 1

## 2017-06-02 MED ORDER — SODIUM CHLORIDE 0.9 % IV BOLUS (SEPSIS)
500.0000 mL | Freq: Once | INTRAVENOUS | Status: AC
  Administered 2017-06-02: 500 mL via INTRAVENOUS

## 2017-06-02 MED ORDER — CYCLOBENZAPRINE HCL 10 MG PO TABS: 10.0000 mg | ORAL_TABLET | Freq: Two times a day (BID) | ORAL | 0 refills | Status: DC | PRN

## 2017-06-02 MED ORDER — TRAMADOL HCL 50 MG PO TABS
50.0000 mg | ORAL_TABLET | Freq: Once | ORAL | Status: AC
  Administered 2017-06-02: 50 mg via ORAL
  Filled 2017-06-02: qty 1

## 2017-06-02 MED ORDER — CEPHALEXIN 500 MG PO CAPS: 500.0000 mg | ORAL_CAPSULE | Freq: Three times a day (TID) | ORAL | 0 refills | Status: AC

## 2017-06-02 NOTE — ED Notes (Signed)
ED Provider at bedside. 

## 2017-06-02 NOTE — Discharge Instructions (Signed)
Please take your antibiotics to treat the possible urinary tract infection. Please use the muscle relaxant to help with your back pain and spasm. Please use your home pain medications as well. Please be careful to stay hydrated and get rest. Please do not for with the muscle relaxants.  Please follow-up with your primary care physician in the next several days for reassessment. If any symptoms change or worsen, please return to the nearest emergency department.

## 2017-06-02 NOTE — ED Provider Notes (Signed)
[place] [facility] Provider Note   CSN: [id number] Arrival date & time: 06/02/17  0739     History   Chief Complaint Chief Complaint  Patient presents with  . Back Pain    HPI Sean Hammond is a 76 y.o. male.  The history is provided by the patient, a relative and medical records.  Back Pain   This is a new problem. The current episode started yesterday. The problem occurs constantly. The problem has not changed since onset.The pain is associated with no known injury. The pain is present in the lumbar spine. The quality of the pain is described as cramping and aching. The pain does not radiate. The pain is at a severity of 9/10. The pain is severe. The symptoms are aggravated by twisting, bending and certain positions. Pertinent negatives include no chest pain, no fever, no numbness, no weight loss, no headaches, no abdominal pain, no abdominal swelling, no bowel incontinence, no dysuria, no leg pain, no paresthesias, no paresis and no weakness. He has tried nothing for the symptoms. The treatment provided no relief.    Past Medical History:  Diagnosis Date  . Allergic rhinitis   . CAD (coronary artery disease)    a. s/p DES to LCx x 2 in 2013 // b. Myoview 7/15: inferior ischemia, EF 52%, low risk  //  c. LHC 6/17: LCx stents patent, no sig CAD elsewhere, EF 65% >> Med Rx  . Chronic headaches    "started  09/2011"  . ED (erectile dysfunction)   . Epicondylitis   . Epistaxis   . Family history of adverse reaction to anesthesia    wife has nausea with anesthesia   . GERD (gastroesophageal reflux disease)   . History of echocardiogram    a. Echo 5/16: mild LVH, EF 55-60%, no RWMA, PASP 33 mmHg  . Hyperlipidemia   . Insomnia    , post  . Leukopenia   . Lung nodule    , Left upper  . Moderate obstructive sleep apnea    not used cpap since 2014 bought bed that head would elevate   . Osteoarthritis of hand   . PONV (postoperative nausea and vomiting)   . Prostatitis     . Silent myocardial infarction ([place])   . Skin cancer    head, ear, back    Patient Active Problem List   Diagnosis Date Noted  . H/O total knee replacement, left 05/09/2017  . Allergy with anaphylaxis due to food 10/18/2015  . Near syncope 03/21/2015  . RUQ abdominal pain 03/21/2015  . Old MI (myocardial infarction) 12/02/2013  . Shortness of breath on exertion   . Hyperlipidemia   . Moderate obstructive sleep apnea   . Silent myocardial infarction ([place])   . Coronary atherosclerosis of native coronary artery 12/14/2011    Past Surgical History:  Procedure Laterality Date  . BONE CYST EXCISION  ~ 2004   left knee  . CARDIAC CATHETERIZATION N/A 05/09/2016   Procedure: Left Heart Cath and Coronary Angiography;  Surgeon: Jerline Pain, MD;  Location: [place] [facility];  Service: Cardiovascular;  Laterality: N/A;  . CHOLECYSTECTOMY N/A 03/23/2015   Procedure: LAPAROSCOPIC CHOLECYSTECTOMY;  Surgeon: Ralene Ok, MD;  Location: [facility];  Service: General;  Laterality: N/A;  . CORONARY ANGIOPLASTY WITH STENT PLACEMENT  12/14/11   "2"  . EYE SURGERY  ~ 2010   "both eyes; reshape pupil"  . KNEE ARTHROSCOPY  ` 2010   left  . LEFT  HEART CATHETERIZATION WITH CORONARY ANGIOGRAM N/A 12/14/2011   Procedure: LEFT HEART CATHETERIZATION WITH CORONARY ANGIOGRAM;  Surgeon: Candee Furbish, MD;  Location[facility] CATH LAB;  Service: Cardiovascular;  Laterality: N/A;  . NASAL SINUS SURGERY  ~ 2002 & 2003  . PERCUTANEOUS CORONARY STENT INTERVENTION (PCI-S)  12/14/2011   Procedure: PERCUTANEOUS CORONARY STENT INTERVENTION (PCI-S);  Surgeon: Candee Furbish, MD;  Location: [facility] CATH LAB;  Service: Cardiovascular;;  . SKIN CANCER EXCISION     "back; head; ears"  . TOTAL KNEE ARTHROPLASTY Left 05/09/2017   Procedure: LEFT TOTAL KNEE ARTHROPLASTY;  Surgeon: Latanya Maudlin, MD;  Location: [facility];  Service: Orthopedics;  Laterality: Left;       Home Medications    Prior to Admission medications   Medication Sig  Start Date End Date Taking? Authorizing Provider  aspirin EC 325 MG tablet Take 1 tablet (325 mg total) by mouth 2 (two) times daily. 05/09/17   Constable, Amber, PA-C  Cholecalciferol (VITAMIN D HIGH POTENCY) 1000 UNITS capsule Take 1,000 Units by mouth daily.    [provider]  diphenhydrAMINE (BENADRYL) 25 MG tablet Take 25-50 mg by mouth every 6 (six) hours as needed (for allergic reactions).     [provider]  EPIPEN 2-PAK 0.3 MG/0.3ML SOAJ injection Inject 0.3 mg into the muscle once as needed (for an anaphylactic reaction).  03/17/14   [provider]  HYDROmorphone (DILAUDID) 2 MG tablet Take 1 tablet (2 mg total) by mouth every 3 (three) hours as needed for moderate pain. Patient not taking: Reported on 05/25/2017 05/09/17   Ardeen Jourdain, PA-C  lidocaine (LIDODERM) 5 % Place 1 patch onto the skin daily as needed (for knee pain). Remove & Discard patch within 12 hours or as directed by MD     [provider]  methocarbamol (ROBAXIN) 500 MG tablet Take 1 tablet (500 mg total) by mouth every 6 (six) hours as needed for muscle spasms. 05/09/17   Constable, Amber, PA-C  metoprolol succinate (TOPROL-XL) 25 MG 24 hr tablet TAKE 1 TABLET (25 MG TOTAL) BY MOUTH DAILY. 02/02/17   Jerline Pain, MD  nitroGLYCERIN (NITROSTAT) 0.4 MG SL tablet PLACE 1 TABLET UNDER TONGUE EVERY 5 MINUTES AS NEEDED FOR CHEST PAIN 10/18/16   Jerline Pain, MD  pantoprazole (PROTONIX) 40 MG tablet TAKE 1 TABLET BY MOUTH DAILY AT 12 NOON *DRUG NOT COVERED BY INSURANCE* Patient taking differently: Take 40 mg by mouth every other day 09/20/16   Jerline Pain, MD  PREVIDENT 5000 SENSITIVE 7.8-2 % PSTE 1 application 2 (two) times daily.  11/24/15   [provider]  tamsulosin (FLOMAX) 0.4 MG CAPS capsule Take 1 capsule (0.4 mg total) by mouth daily. 05/09/17   Constable, Amber, PA-C  traMADol (ULTRAM) 50 MG tablet Take 50 mg by mouth every 4 (four) hours as needed (for pain).  04/25/17    [provider]    Family History Family History  Problem Relation Age of Onset  . Heart attack Mother   . Hypertension Mother     Social History Social History  Substance Use Topics  . Smoking status: Former Smoker    Packs/day: 2.00    Years: 12.00    Types: Cigarettes  . Smokeless tobacco: Never Used     Comment: "quit smoking cigarettes 1975"  . Alcohol use Yes     Comment: "last beer 1969"     Allergies   Beef-derived products; Pork-derived products; Codeine; Crestor [rosuvastatin]; Dilaudid [hydromorphone hcl]; Hydrocodone;  Lipitor [atorvastatin]; Percocet [oxycodone-acetaminophen]; and Pitavastatin   Review of Systems Review of Systems  Constitutional: Negative for appetite change, diaphoresis, fatigue, fever and weight loss.  HENT: Negative for congestion.   Respiratory: Negative for cough, chest tightness, shortness of breath, wheezing and stridor.   Cardiovascular: Negative for chest pain and palpitations.  Gastrointestinal: Positive for constipation. Negative for abdominal pain, blood in stool, bowel incontinence, diarrhea, nausea and vomiting.  Genitourinary: Negative for decreased urine volume, difficulty urinating, dysuria, flank pain, frequency and hematuria.  Musculoskeletal: Positive for back pain. Negative for neck pain and neck stiffness.  Skin: Positive for wound (surgical scar). Negative for rash.  Neurological: Negative for weakness, light-headedness, numbness, headaches and paresthesias.  Psychiatric/Behavioral: Negative for agitation.  All other systems reviewed and are negative.    Physical Exam Updated Vital Signs BP 140/80 (BP Location: Right Arm)   Pulse (!) 104   Temp 98.2 F (36.8 C) (Oral)   Resp 16   Ht 6\' 1"  (1.854 m)   Wt 85.3 kg (188 lb)   SpO2 99%   BMI 24.80 kg/m   Physical Exam  Constitutional: He is oriented to person, place, and time. He appears well-developed and well-nourished. No distress.  HENT:  Head:  Normocephalic and atraumatic.  Right Ear: External ear normal.  Left Ear: External ear normal.  Nose: Nose normal.  Mouth/Throat: Oropharynx is clear and moist. No oropharyngeal exudate.  Eyes: Pupils are equal, round, and reactive to light. Conjunctivae and EOM are normal.  Neck: Normal range of motion. Neck supple.  Cardiovascular: Intact distal pulses.   No murmur heard. Pulmonary/Chest: Effort normal. No stridor. No respiratory distress. He has no wheezes. He exhibits no tenderness.  Abdominal: Soft. There is no tenderness. There is no rebound and no guarding.  Musculoskeletal: He exhibits tenderness. He exhibits no edema or deformity.       Thoracic back: He exhibits tenderness, pain and spasm. He exhibits no swelling, no edema and no laceration.       Back:       Legs: Neurological: He is alert and oriented to person, place, and time. No cranial nerve deficit or sensory deficit. He exhibits normal muscle tone.  Skin: Skin is warm. Capillary refill takes less than 2 seconds. No rash noted. He is not diaphoretic. No erythema. No pallor.     ED Treatments / Results  Labs (all labs ordered are listed, but only abnormal results are displayed) Labs Reviewed  URINALYSIS, ROUTINE W REFLEX MICROSCOPIC - Abnormal; Notable for the following:       Result Value   Leukocytes, UA SMALL (*)    All other components within normal limits  CBC WITH DIFFERENTIAL/PLATELET - Abnormal; Notable for the following:    RBC 4.09 (*)    Hemoglobin 12.9 (*)    HCT 37.4 (*)    All other components within normal limits  COMPREHENSIVE METABOLIC PANEL - Abnormal; Notable for the following:    Glucose, Bld 118 (*)    ALT 11 (*)    All other components within normal limits  URINALYSIS, MICROSCOPIC (REFLEX) - Abnormal; Notable for the following:    Bacteria, UA MANY (*)    Squamous Epithelial / LPF 0-5 (*)    All other components within normal limits  URINE CULTURE  LIPASE, BLOOD    EKG  EKG  Interpretation None       Radiology No results found.  Procedures Procedures (including critical care time)  Medications Ordered in ED Medications  cyclobenzaprine (FLEXERIL) tablet 10 mg (10 mg Oral Given 06/02/17 0823)  traMADol (ULTRAM) tablet 50 mg (50 mg Oral Given 06/02/17 0823)  sodium chloride 0.9 % bolus 500 mL (0 mLs Intravenous Stopped 06/02/17 0925)     Initial Impression / Assessment and Plan / ED Course  I have reviewed the triage vital signs and the nursing notes.  Pertinent labs & imaging results that were available during my care of the patient were reviewed by me and considered in my medical decision making (see chart for details).     Sean Hammond is a 76 y.o. male with a past medical history significant for CAD with PCI, hyperlipidemia, GERD, and total knee replacement on 05/09/17 who presents with left-sided back pain. Patient reports that last week, he had shortness of breath and was worked up for pulmonary embolism. Patient no evidence of PE or aortic dissection. Patient says that his shortness of breath has completely resolved but yesterday, he began having pain in his left back. He says it is a cramping and sharp pain. He reports it is worsened with any twisting or movement. It is worse with palpation of his back. He says it is worse when he bends. He denies history of pyelonephritis or kidney stones. He denies any traumas. He denies any chest pain, shortness breath or abdominal pain. He denies any nausea, vomiting, diarrhea, or dysuria. He denies hematuria. He does report chronic constipation and he has been straining for bowel movements all week. He reports that is a good bowel movement yesterday. No bleeding involved.  History and exam are seen above. On exam, patient has left-sided paraspinal tenderness in the mid and low back. No midline tenderness. No tenderness on the right side. Patient's pain is reproduced with palpation of the muscle. Muscle does appear  tense and concerning for muscle spasm. Patient has well-healing scar on the left knee from his knee replacement. Normal sensation on both legs. Palpable DP pulse bilaterally. No significant discoloration of the leg. Abdomen nontender. Lungs clear.  Based on patient's symptoms, suspect musculoskeletal pain possibly from the bowel movement straining and twisting. However, given the left back tenderness near the CVA area, patient will have urinalysis and laboratory testing to look for pyelonephritis, kidney stone, or UTI.  Based on description of symptoms and recent reassuring imaging , doubt aortic dissection or pulmonary embolism.   Patient is allergic to many pain medications however, he can tolerate tramadol. Will re-dose tramadol and add Flexeril to try and alleviate possible muscular skeletal pain. Anticipate reassessment following urinalysis, blood work, and medications.  10:58 AM Patient reports improvement in his back pain after medications. Patient had no leukocytosis. No evidence of pancreatitis with reassuring lipase. CMP showed normal kidney and liver function. Patient's urinalysis did show leukocytes and bacteria. In the setting of his flank pain, patient will be treated with antibiotics for possible pyelonephritis. Patient will be given prescription for antibiotics and muscle relaxant as this seems to help. Based on improvement, history, workup and exam, doubt pulmonary embolism or aortic dissection. Continue to suspect muscle spasm versus UTI/pyelonephritis.  Patient will continue his outpatient pain medications. Patient will be instructed to follow-up with his PCP for reassessment and further management. Strict return precautions were given and understood. Patient had no questions or concerns and patient was discharged with family in good condition.   Final Clinical Impressions(s) / ED Diagnoses   Final diagnoses:  Acute left-sided low back pain without sciatica  Acute cystitis without  hematuria  New Prescriptions New Prescriptions   CEPHALEXIN (KEFLEX) 500 MG CAPSULE    Take 1 capsule (500 mg total) by mouth 3 (three) times daily.   CYCLOBENZAPRINE (FLEXERIL) 10 MG TABLET    Take 1 tablet (10 mg total) by mouth 2 (two) times daily as needed for muscle spasms.    Clinical Impression: 1. Acute left-sided low back pain without sciatica   2. Acute cystitis without hematuria     Disposition: Discharge  Condition: Good  I have discussed the results, Dx and Tx plan with the pt(& family if present). He/she/they expressed understanding and agree(s) with the plan. Discharge instructions discussed at great length. Strict return precautions discussed and pt &/or family have verbalized understanding of the instructions. No further questions at time of discharge.    New Prescriptions   CEPHALEXIN (KEFLEX) 500 MG CAPSULE    Take 1 capsule (500 mg total) by mouth 3 (three) times daily.   CYCLOBENZAPRINE (FLEXERIL) 10 MG TABLET    Take 1 tablet (10 mg total) by mouth 2 (two) times daily as needed for muscle spasms.    Follow Up: Josetta Huddle, MD [address] [place] [address] [phone]  Schedule an appointment as soon as possible for a visit    [place] [address] [id number] [facility] [address] [place] [phone]  If symptoms worsen     Tegeler, Gwenyth Allegra, MD 06/02/17 1931

## 2017-06-02 NOTE — ED Triage Notes (Signed)
Pt reports low back pain since yesterday (L>R). Denies known injury (reports severe constipation that he treated yesterday with success -- reports excessive straining). Reports pain is worse with movement. Denies fever, urinary symptoms, radiation down leg, n/v.

## 2017-06-03 LAB — URINE CULTURE: Culture: NO GROWTH

## 2017-06-08 DIAGNOSIS — R278 Other lack of coordination: Secondary | ICD-10-CM | POA: Diagnosis not present

## 2017-06-08 DIAGNOSIS — R262 Difficulty in walking, not elsewhere classified: Secondary | ICD-10-CM | POA: Diagnosis not present

## 2017-06-08 DIAGNOSIS — Z4733 Aftercare following explantation of knee joint prosthesis: Secondary | ICD-10-CM | POA: Diagnosis not present

## 2017-06-08 DIAGNOSIS — M6281 Muscle weakness (generalized): Secondary | ICD-10-CM | POA: Diagnosis not present

## 2017-06-11 DIAGNOSIS — Z889 Allergy status to unspecified drugs, medicaments and biological substances status: Secondary | ICD-10-CM | POA: Diagnosis not present

## 2017-06-11 DIAGNOSIS — R5383 Other fatigue: Secondary | ICD-10-CM | POA: Diagnosis not present

## 2017-06-11 DIAGNOSIS — N390 Urinary tract infection, site not specified: Secondary | ICD-10-CM | POA: Diagnosis not present

## 2017-06-14 DIAGNOSIS — R278 Other lack of coordination: Secondary | ICD-10-CM | POA: Diagnosis not present

## 2017-06-14 DIAGNOSIS — Z4733 Aftercare following explantation of knee joint prosthesis: Secondary | ICD-10-CM | POA: Diagnosis not present

## 2017-06-14 DIAGNOSIS — R262 Difficulty in walking, not elsewhere classified: Secondary | ICD-10-CM | POA: Diagnosis not present

## 2017-06-14 DIAGNOSIS — M6281 Muscle weakness (generalized): Secondary | ICD-10-CM | POA: Diagnosis not present

## 2017-06-19 DIAGNOSIS — R278 Other lack of coordination: Secondary | ICD-10-CM | POA: Diagnosis not present

## 2017-06-19 DIAGNOSIS — R262 Difficulty in walking, not elsewhere classified: Secondary | ICD-10-CM | POA: Diagnosis not present

## 2017-06-19 DIAGNOSIS — Z4733 Aftercare following explantation of knee joint prosthesis: Secondary | ICD-10-CM | POA: Diagnosis not present

## 2017-06-19 DIAGNOSIS — M6281 Muscle weakness (generalized): Secondary | ICD-10-CM | POA: Diagnosis not present

## 2017-06-21 DIAGNOSIS — R278 Other lack of coordination: Secondary | ICD-10-CM | POA: Diagnosis not present

## 2017-06-21 DIAGNOSIS — M1712 Unilateral primary osteoarthritis, left knee: Secondary | ICD-10-CM | POA: Diagnosis not present

## 2017-06-21 DIAGNOSIS — R262 Difficulty in walking, not elsewhere classified: Secondary | ICD-10-CM | POA: Diagnosis not present

## 2017-06-21 DIAGNOSIS — Z4733 Aftercare following explantation of knee joint prosthesis: Secondary | ICD-10-CM | POA: Diagnosis not present

## 2017-06-21 DIAGNOSIS — Z96652 Presence of left artificial knee joint: Secondary | ICD-10-CM | POA: Diagnosis not present

## 2017-06-21 DIAGNOSIS — Z471 Aftercare following joint replacement surgery: Secondary | ICD-10-CM | POA: Diagnosis not present

## 2017-06-21 DIAGNOSIS — M6281 Muscle weakness (generalized): Secondary | ICD-10-CM | POA: Diagnosis not present

## 2017-07-31 DIAGNOSIS — L570 Actinic keratosis: Secondary | ICD-10-CM | POA: Diagnosis not present

## 2017-08-14 DIAGNOSIS — R300 Dysuria: Secondary | ICD-10-CM | POA: Diagnosis not present

## 2017-09-07 DIAGNOSIS — N644 Mastodynia: Secondary | ICD-10-CM | POA: Diagnosis not present

## 2017-09-07 DIAGNOSIS — Z23 Encounter for immunization: Secondary | ICD-10-CM | POA: Diagnosis not present

## 2017-09-16 ENCOUNTER — Other Ambulatory Visit: Payer: Self-pay | Admitting: Cardiology

## 2017-09-18 DIAGNOSIS — Z96652 Presence of left artificial knee joint: Secondary | ICD-10-CM | POA: Diagnosis not present

## 2017-09-18 DIAGNOSIS — M1712 Unilateral primary osteoarthritis, left knee: Secondary | ICD-10-CM | POA: Diagnosis not present

## 2017-09-18 DIAGNOSIS — Z471 Aftercare following joint replacement surgery: Secondary | ICD-10-CM | POA: Diagnosis not present

## 2017-09-22 DIAGNOSIS — J0100 Acute maxillary sinusitis, unspecified: Secondary | ICD-10-CM | POA: Diagnosis not present

## 2017-10-08 DIAGNOSIS — H52203 Unspecified astigmatism, bilateral: Secondary | ICD-10-CM | POA: Diagnosis not present

## 2017-10-08 DIAGNOSIS — H2513 Age-related nuclear cataract, bilateral: Secondary | ICD-10-CM | POA: Diagnosis not present

## 2017-11-15 DIAGNOSIS — Z885 Allergy status to narcotic agent status: Secondary | ICD-10-CM | POA: Diagnosis not present

## 2017-11-15 DIAGNOSIS — I2510 Atherosclerotic heart disease of native coronary artery without angina pectoris: Secondary | ICD-10-CM | POA: Diagnosis not present

## 2017-11-15 DIAGNOSIS — S61412A Laceration without foreign body of left hand, initial encounter: Secondary | ICD-10-CM | POA: Diagnosis not present

## 2017-11-15 DIAGNOSIS — S6992XA Unspecified injury of left wrist, hand and finger(s), initial encounter: Secondary | ICD-10-CM | POA: Diagnosis not present

## 2017-11-15 DIAGNOSIS — Z951 Presence of aortocoronary bypass graft: Secondary | ICD-10-CM | POA: Diagnosis not present

## 2017-11-15 DIAGNOSIS — Z6826 Body mass index (BMI) 26.0-26.9, adult: Secondary | ICD-10-CM | POA: Diagnosis not present

## 2017-11-21 DIAGNOSIS — S61412S Laceration without foreign body of left hand, sequela: Secondary | ICD-10-CM | POA: Diagnosis not present

## 2017-12-06 ENCOUNTER — Other Ambulatory Visit: Payer: Self-pay | Admitting: Cardiology

## 2017-12-25 ENCOUNTER — Ambulatory Visit: Payer: Medicare Other | Admitting: Cardiology

## 2017-12-27 ENCOUNTER — Encounter: Payer: Self-pay | Admitting: Cardiology

## 2017-12-27 ENCOUNTER — Ambulatory Visit (INDEPENDENT_AMBULATORY_CARE_PROVIDER_SITE_OTHER): Payer: Medicare Other | Admitting: Cardiology

## 2017-12-27 VITALS — BP 136/74 | HR 61 | Ht 73.0 in | Wt 192.2 lb

## 2017-12-27 DIAGNOSIS — I25119 Atherosclerotic heart disease of native coronary artery with unspecified angina pectoris: Secondary | ICD-10-CM

## 2017-12-27 DIAGNOSIS — E7800 Pure hypercholesterolemia, unspecified: Secondary | ICD-10-CM | POA: Diagnosis not present

## 2017-12-27 DIAGNOSIS — I252 Old myocardial infarction: Secondary | ICD-10-CM | POA: Diagnosis not present

## 2017-12-27 MED ORDER — ASPIRIN 81 MG PO TBEC: 81.0000 mg | DELAYED_RELEASE_TABLET | Freq: Two times a day (BID) | ORAL | Status: DC

## 2017-12-27 MED ORDER — ASPIRIN 81 MG PO TBEC: 81.0000 mg | DELAYED_RELEASE_TABLET | Freq: Every day | ORAL | Status: DC

## 2017-12-27 NOTE — Patient Instructions (Signed)

## 2017-12-27 NOTE — Progress Notes (Signed)
[place]. [address], [address] [place], [place]  [address] Phone: [phone] Fax:  [phone]  Date:  12/27/2017   ID:  Sean Hammond, DOB 05-06-1941, MRN [id number]  PCP:  Josetta Huddle, MD   History of Present Illness: Sean Hammond is a 77 y.o. male with coronary artery disease status post overlapping drug eluting stents to his mid circumflex with prior ejection fraction of 44%. These were placed in January of 2013. CPAP for OSA. Prior nuclear stress test showed base to mid inferior wall ischemia in 2013. This was prior to stent placement in the circumflex artery.  He ended up having a cardiac catheterization in June 2017 which showed patent circumflex stent and normal coronary arteries otherwise. Normal EF. Reassurance.  In the past a major complaint was exertional fatigue. He seems to be improving from this.  Cholecystectomy  Had vasovagal syncope May 2016.  Previously felt chest pain with exertion as well as rest, sometimes when laying down, moderate in intensity resulting in a few minutes. On low-dose beta blocker.  He has stopped Crestor/statins because of leg pains, flank pain. He thinks that his leg discomfort is getting better. He has seen Dr. Gladstone Lighter in the past who has also worked up gallbladder disease, normal.   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   Could not tolerate Crestor. Hurt so bad. Livalo also had issues with.    Enjoying hunting.  12/27/17 - ER visit 05/2017 - CT chest no PE. Denies CP, SOB, F/V/N/C. Slipped in cabin shower, split open hand. Stiches in [place]. Doing well. Feels well. Some left shoulder pain.   Wt Readings from Last 3 Encounters:  12/27/17 192 lb 4 oz (87.2 kg)  06/02/17 188 lb (85.3 kg)  05/25/17 188 lb (85.3 kg)     Past Medical History:  Diagnosis Date  . Allergic rhinitis   . CAD (coronary artery disease)    a. s/p DES to LCx x  2 in 2013 // b. Myoview 7/15: inferior ischemia, EF 52%, low risk  //  c. LHC 6/17: LCx stents patent, no sig CAD elsewhere, EF 65% >> Med Rx  . Chronic headaches    "started  09/2011"  . ED (erectile dysfunction)   . Epicondylitis   . Epistaxis   . Family history of adverse reaction to anesthesia    wife has nausea with anesthesia   . GERD (gastroesophageal reflux disease)   . History of echocardiogram    a. Echo 5/16: mild LVH, EF 55-60%, no RWMA, PASP 33 mmHg  . Hyperlipidemia   . Insomnia    , post  . Leukopenia   . Lung nodule    , Left upper  . Moderate obstructive sleep apnea    not used cpap since 2014 bought bed that head would elevate   . Osteoarthritis of hand   . PONV (postoperative nausea and vomiting)   . Prostatitis   . Silent myocardial infarction ([place])   . Skin cancer    head, ear, back    Past Surgical History:  Procedure Laterality Date  . BONE CYST EXCISION  ~ 2004   left knee  . CARDIAC CATHETERIZATION N/A 05/09/2016   Procedure: Left Heart Cath and Coronary Angiography;  Surgeon: Jerline Pain, MD;  Location: [facility];  Service: Cardiovascular;  Laterality: N/A;  . CHOLECYSTECTOMY N/A  03/23/2015   Procedure: LAPAROSCOPIC CHOLECYSTECTOMY;  Surgeon: Ralene Ok, MD;  Location: [facility];  Service: General;  Laterality: N/A;  . CORONARY ANGIOPLASTY WITH STENT PLACEMENT  12/14/11   "2"  . EYE SURGERY  ~ 2010   "both eyes; reshape pupil"  . KNEE ARTHROSCOPY  ` 2010   left  . LEFT HEART CATHETERIZATION WITH CORONARY ANGIOGRAM N/A 12/14/2011   Procedure: LEFT HEART CATHETERIZATION WITH CORONARY ANGIOGRAM;  Surgeon: Candee Furbish, MD;  Location: [facility];  Service: Cardiovascular;  Laterality: N/A;  . NASAL SINUS SURGERY  ~ 2002 & 2003  . PERCUTANEOUS CORONARY STENT INTERVENTION (PCI-S)  12/14/2011   Procedure: PERCUTANEOUS CORONARY STENT INTERVENTION (PCI-S);  Surgeon: Candee Furbish, MD;  Location: [facility];  Service: Cardiovascular;;  . SKIN CANCER  EXCISION     "back; head; ears"  . TOTAL KNEE ARTHROPLASTY Left 05/09/2017   Procedure: LEFT TOTAL KNEE ARTHROPLASTY;  Surgeon: Latanya Maudlin, MD;  Location: [facility];  Service: Orthopedics;  Laterality: Left;    Current Outpatient Medications  Medication Sig Dispense Refill  . aspirin EC 81 MG EC tablet Take 1 tablet (81 mg total) by mouth 2 (two) times daily.    . Cholecalciferol (VITAMIN D HIGH POTENCY) 1000 UNITS capsule Take 1,000 Units by mouth daily.    . clidinium-chlordiazePOXIDE (LIBRAX) 5-2.5 MG capsule     . cyclobenzaprine (FLEXERIL) 10 MG tablet Take 1 tablet (10 mg total) by mouth 2 (two) times daily as needed for muscle spasms. 20 tablet 0  . diphenhydrAMINE (BENADRYL) 25 MG tablet Take 25-50 mg by mouth every 6 (six) hours as needed (for allergic reactions).     . EPIPEN 2-PAK 0.3 MG/0.3ML SOAJ injection Inject 0.3 mg into the muscle once as needed (for an anaphylactic reaction).     Marland Kitchen lidocaine (LIDODERM) 5 % Place 1 patch onto the skin daily as needed (for knee pain). Remove & Discard patch within 12 hours or as directed by MD     . methocarbamol (ROBAXIN) 500 MG tablet Take 1 tablet (500 mg total) by mouth every 6 (six) hours as needed for muscle spasms. 60 tablet 0  . metoprolol succinate (TOPROL-XL) 25 MG 24 hr tablet Take 1 tablet (25 mg total) by mouth daily. Please call and schedule a one year follow up appointment for further refills 1st attempt 90 tablet 0  . nitroGLYCERIN (NITROSTAT) 0.4 MG SL tablet PLACE 1 TABLET UNDER TONGUE EVERY 5 MINUTES AS NEEDED FOR CHEST PAIN 25 tablet 0  . pantoprazole (PROTONIX) 40 MG tablet TAKE 1 TABLET BY MOUTH DAILY AT 12 NOON *DRUG NOT COVERED BY INSURANCE* (Patient taking differently: Take 40 mg by mouth every other day) 30 tablet 6  . PREVIDENT 5000 SENSITIVE 0.9-6 % PSTE 1 application 2 (two) times daily.   6  . tamsulosin (FLOMAX) 0.4 MG CAPS capsule Take 1 capsule (0.4 mg total) by mouth daily. 30 capsule 0  . traMADol (ULTRAM) 50  MG tablet Take 50 mg by mouth every 4 (four) hours as needed (for pain).   2   No current facility-administered medications for this visit.     Allergies:    Allergies  Allergen Reactions  . Beef-Derived Products Anaphylaxis, Hives, Shortness Of Breath and Rash    Because of a tick bite, the patient now has "Alpha-gal allergy"  . Pork-Derived Products Anaphylaxis, Hives, Shortness Of Breath and Rash    Because of a tick bite, the patient now has "Alpha-gal allergy"  . Codeine  Other (See Comments)    Surgeon advised against this  . Crestor [Rosuvastatin] Other (See Comments)    Muscle aches  . Dilaudid [Hydromorphone Hcl] Hives    All-over hives  . Hydrocodone Itching  . Lipitor [Atorvastatin] Other (See Comments)    Muscle aches   . Percocet [Oxycodone-Acetaminophen] Itching  . Pitavastatin Other (See Comments)    Leg pain    Social History:  The patient  reports that he has quit smoking. His smoking use included cigarettes. He has a 24.00 pack-year smoking history. he has never used smokeless tobacco. He reports that he drinks alcohol. He reports that he does not use drugs.   ROS:  Please see the history of present illness.  All other ROS neg.    PHYSICAL EXAM: VS:  BP 136/74   Pulse 61   Ht 6\' 1"  (1.854 m)   Wt 192 lb 4 oz (87.2 kg)   SpO2 98%   BMI 25.36 kg/m  GEN: Well nourished, well developed, in no acute distress  HEENT: normal  Neck: no JVD, carotid bruits, or masses Cardiac: RRR; no murmurs, rubs, or gallops,no edema  Respiratory:  clear to auscultation bilaterally, normal work of breathing GI: soft, nontender, nondistended, + BS MS: no deformity or atrophy  Skin: warm and dry, no rash, left hand scar.  Neuro:  Alert and Oriented x 3, Strength and sensation are intact Psych: euthymic mood, full affect   EKG:  EKG was ordered today. 05/04/2016-sinus rhythm, 66, no other abnormalities. 06/08/14 -Sinus rhythm rate 80, no other abnormalities.   Labs:  11/26/13-LDL 86, ALT 27  Nuclear stress test: 2013-inferior wall infarct/ ischemia-EF 44%  05/2014 - mild inferior inferior ischemia.   Cath 05/09/16 Conclusion    Widely patent circumflex stents previously placed  No other angiographically significant coronary artery disease present.  Normal left ventriculogram, no mitral regurgitation, no aortic stenosis, EF 65%   Candee Furbish, MD Continue with medical therapy, reassurance     ASSESSMENT AND PLAN:  1. Coronary artery disease-prior circumflex stenting. Aspirin. Prior stent 2013. Reassuring cardiac catheterization June 2017. Angina/exertional angina -fatigue,  shortness of breath. This seems to be improved Toprol 25. He did not tolerate isosorbide 15 mg because of headache. He even tried to take it at night. He will now take this on as-needed basis.  The best thing to prevent future heart attacks are diet, exercise, statin medications. Has tried Crestor, Livalo 2mg  which is a better tolerated statin, did not tolerate well, leg pains. Statin intolerance. Not interested in injectable. Now taking supplement.  2. Fatigue-certainly could be multifactorial. Reassuring heart cath 2017 3. Old myocardial infarction-previously silent. 4. Hyperlipidemia - Did not tolerate Crestor, Livalo 2mg  twice a week. Continue to encourage diet and exercise. Could try Zetia but not interested. No changes 5. Left Knee replacement. Stable.  6. One-year follow-up  Signed, Candee Furbish, MD [facility]  12/27/2017 4:18 PM

## 2017-12-28 ENCOUNTER — Encounter: Payer: Self-pay | Admitting: Cardiology

## 2018-02-13 DIAGNOSIS — J0100 Acute maxillary sinusitis, unspecified: Secondary | ICD-10-CM | POA: Diagnosis not present

## 2018-02-21 ENCOUNTER — Other Ambulatory Visit: Payer: Self-pay | Admitting: Cardiology

## 2018-02-28 DIAGNOSIS — L570 Actinic keratosis: Secondary | ICD-10-CM | POA: Diagnosis not present

## 2018-02-28 DIAGNOSIS — L821 Other seborrheic keratosis: Secondary | ICD-10-CM | POA: Diagnosis not present

## 2018-05-14 ENCOUNTER — Ambulatory Visit
Admission: RE | Admit: 2018-05-14 | Discharge: 2018-05-14 | Disposition: A | Discharge status: Home / Self Care | Payer: Medicare Other | Source: Ambulatory Visit | Attending: Internal Medicine | Admitting: Internal Medicine

## 2018-05-14 ENCOUNTER — Other Ambulatory Visit: Payer: Self-pay | Admitting: Internal Medicine

## 2018-05-14 DIAGNOSIS — E782 Mixed hyperlipidemia: Secondary | ICD-10-CM | POA: Diagnosis not present

## 2018-05-14 DIAGNOSIS — R079 Chest pain, unspecified: Secondary | ICD-10-CM | POA: Diagnosis not present

## 2018-05-14 DIAGNOSIS — G4733 Obstructive sleep apnea (adult) (pediatric): Secondary | ICD-10-CM | POA: Diagnosis not present

## 2018-05-14 DIAGNOSIS — R972 Elevated prostate specific antigen [PSA]: Secondary | ICD-10-CM | POA: Diagnosis not present

## 2018-05-14 DIAGNOSIS — M170 Bilateral primary osteoarthritis of knee: Secondary | ICD-10-CM | POA: Diagnosis not present

## 2018-05-14 DIAGNOSIS — Z0001 Encounter for general adult medical examination with abnormal findings: Secondary | ICD-10-CM | POA: Diagnosis not present

## 2018-05-14 DIAGNOSIS — E559 Vitamin D deficiency, unspecified: Secondary | ICD-10-CM | POA: Diagnosis not present

## 2018-05-14 DIAGNOSIS — Z789 Other specified health status: Secondary | ICD-10-CM | POA: Diagnosis not present

## 2018-05-14 DIAGNOSIS — Z1389 Encounter for screening for other disorder: Secondary | ICD-10-CM | POA: Diagnosis not present

## 2018-05-14 DIAGNOSIS — I2510 Atherosclerotic heart disease of native coronary artery without angina pectoris: Secondary | ICD-10-CM | POA: Diagnosis not present

## 2018-05-14 DIAGNOSIS — Z79899 Other long term (current) drug therapy: Secondary | ICD-10-CM | POA: Diagnosis not present

## 2018-05-14 DIAGNOSIS — G720 Drug-induced myopathy: Secondary | ICD-10-CM | POA: Diagnosis not present

## 2018-05-15 ENCOUNTER — Other Ambulatory Visit: Payer: Self-pay | Admitting: Cardiology

## 2018-06-17 DIAGNOSIS — K219 Gastro-esophageal reflux disease without esophagitis: Secondary | ICD-10-CM | POA: Diagnosis not present

## 2018-06-17 DIAGNOSIS — I2510 Atherosclerotic heart disease of native coronary artery without angina pectoris: Secondary | ICD-10-CM | POA: Diagnosis not present

## 2018-06-17 DIAGNOSIS — M940 Chondrocostal junction syndrome [Tietze]: Secondary | ICD-10-CM | POA: Diagnosis not present

## 2018-07-22 ENCOUNTER — Other Ambulatory Visit: Payer: Self-pay | Admitting: Cardiology

## 2018-07-31 DIAGNOSIS — J0100 Acute maxillary sinusitis, unspecified: Secondary | ICD-10-CM | POA: Diagnosis not present

## 2018-08-22 DIAGNOSIS — J0100 Acute maxillary sinusitis, unspecified: Secondary | ICD-10-CM | POA: Diagnosis not present

## 2018-08-26 DIAGNOSIS — R233 Spontaneous ecchymoses: Secondary | ICD-10-CM | POA: Diagnosis not present

## 2018-09-10 DIAGNOSIS — L570 Actinic keratosis: Secondary | ICD-10-CM | POA: Diagnosis not present

## 2018-10-14 DIAGNOSIS — H2513 Age-related nuclear cataract, bilateral: Secondary | ICD-10-CM | POA: Diagnosis not present

## 2018-10-28 DIAGNOSIS — Z23 Encounter for immunization: Secondary | ICD-10-CM | POA: Diagnosis not present

## 2018-10-31 DIAGNOSIS — J209 Acute bronchitis, unspecified: Secondary | ICD-10-CM | POA: Diagnosis not present

## 2018-11-05 DIAGNOSIS — R310 Gross hematuria: Secondary | ICD-10-CM | POA: Diagnosis not present

## 2018-11-07 DIAGNOSIS — R339 Retention of urine, unspecified: Secondary | ICD-10-CM | POA: Diagnosis not present

## 2018-11-07 DIAGNOSIS — N281 Cyst of kidney, acquired: Secondary | ICD-10-CM | POA: Diagnosis not present

## 2018-11-07 DIAGNOSIS — R310 Gross hematuria: Secondary | ICD-10-CM | POA: Diagnosis not present

## 2018-11-21 DIAGNOSIS — R310 Gross hematuria: Secondary | ICD-10-CM | POA: Diagnosis not present

## 2018-11-21 DIAGNOSIS — K5730 Diverticulosis of large intestine without perforation or abscess without bleeding: Secondary | ICD-10-CM | POA: Diagnosis not present

## 2018-12-03 DIAGNOSIS — R339 Retention of urine, unspecified: Secondary | ICD-10-CM | POA: Diagnosis not present

## 2018-12-03 DIAGNOSIS — R310 Gross hematuria: Secondary | ICD-10-CM | POA: Diagnosis not present

## 2018-12-03 DIAGNOSIS — N281 Cyst of kidney, acquired: Secondary | ICD-10-CM | POA: Diagnosis not present

## 2018-12-17 ENCOUNTER — Ambulatory Visit (INDEPENDENT_AMBULATORY_CARE_PROVIDER_SITE_OTHER): Payer: Medicare Other | Admitting: Cardiology

## 2018-12-17 ENCOUNTER — Encounter: Payer: Self-pay | Admitting: Cardiology

## 2018-12-17 VITALS — BP 144/76 | HR 70 | Ht 73.0 in | Wt 198.2 lb

## 2018-12-17 DIAGNOSIS — I2510 Atherosclerotic heart disease of native coronary artery without angina pectoris: Secondary | ICD-10-CM | POA: Diagnosis not present

## 2018-12-17 DIAGNOSIS — I25119 Atherosclerotic heart disease of native coronary artery with unspecified angina pectoris: Secondary | ICD-10-CM | POA: Diagnosis not present

## 2018-12-17 DIAGNOSIS — E7800 Pure hypercholesterolemia, unspecified: Secondary | ICD-10-CM | POA: Diagnosis not present

## 2018-12-17 DIAGNOSIS — I252 Old myocardial infarction: Secondary | ICD-10-CM | POA: Diagnosis not present

## 2018-12-17 NOTE — Progress Notes (Signed)
[place]. [address], [address] [place],   [address] Phone: [phone] Fax:  [phone]  Date:  12/17/2018   ID:  Sean Hammond, DOB [birth year], MRN [id number]  PCP:  Josetta Huddle, MD   History of Present Illness: Sean Hammond is a 78 y.o. male with coronary artery disease status post overlapping drug eluting stents to his mid circumflex with prior ejection fraction of 44%. These were placed in January of 2013. CPAP for OSA. Prior nuclear stress test showed base to mid inferior wall ischemia in 2013. This was prior to stent placement in the circumflex artery.  He ended up having a cardiac catheterization in June 2017 which showed patent circumflex stent and normal coronary arteries otherwise. Normal EF. Reassurance.  In the past a major complaint was exertional fatigue. He seems to be improving from this.  Cholecystectomy  Had vasovagal syncope May 2016.  Previously felt chest pain with exertion as well as rest, sometimes when laying down, moderate in intensity resulting in a few minutes. On low-dose beta blocker.  He has stopped Crestor/statins because of leg pains, flank pain. He thinks that his leg discomfort is getting better. He has seen Dr. Gladstone Lighter in the past who has also worked up gallbladder disease, normal.   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   Could not tolerate Crestor. Hurt so bad. Livalo also had issues with.    Enjoying hunting.  12/27/17 - ER visit 05/2017 - CT chest no PE. Denies CP, SOB, F/V/N/C. Slipped in cabin shower, split open hand. Stiches in [place]. Doing well. Feels well. Some left shoulder pain.   12/17/2018 - MSK like sharp pain. Low back. ?firnomyo Easy noted on forearms, he fell while he was rabbit hunting.  Overall he has been doing fairly well.  Does have aches and pains.  Still not interested in any retrial of statin therapy or  injectables.  Denies any fevers chills nausea vomiting syncope bleeding  Wt Readings from Last 3 Encounters:  12/17/18 198 lb 3.2 oz (89.9 kg)  12/27/17 192 lb 4 oz (87.2 kg)  06/02/17 188 lb (85.3 kg)     Past Medical History:  Diagnosis Date  . Allergic rhinitis   . CAD (coronary artery disease)    a. s/p DES to LCx x 2 in 2013 // b. Myoview 7/15: inferior ischemia, EF 52%, low risk  //  c. LHC 6/17: LCx stents patent, no sig CAD elsewhere, EF 65% >> Med Rx  . Chronic headaches    "started  09/2011"  . ED (erectile dysfunction)   . Epicondylitis   . Epistaxis   . Family history of adverse reaction to anesthesia    wife has nausea with anesthesia   . GERD (gastroesophageal reflux disease)   . History of echocardiogram    a. Echo 5/16: mild LVH, EF 55-60%, no RWMA, PASP 33 mmHg  . Hyperlipidemia   . Insomnia    , post  . Leukopenia   . Lung nodule    , Left upper  . Moderate obstructive sleep apnea    not used cpap since 2014 bought bed that head would elevate   . Osteoarthritis of hand   . PONV (postoperative nausea and vomiting)   . Prostatitis   . Silent myocardial infarction ([place])   . Skin cancer    head, ear, back  Past Surgical History:  Procedure Laterality Date  . BONE CYST EXCISION  ~ 2004   left knee  . CARDIAC CATHETERIZATION N/A 05/09/2016   Procedure: Left Heart Cath and Coronary Angiography;  Surgeon: Jerline Pain, MD;  Location: [facility];  Service: Cardiovascular;  Laterality: N/A;  . CHOLECYSTECTOMY N/A 03/23/2015   Procedure: LAPAROSCOPIC CHOLECYSTECTOMY;  Surgeon: Ralene Ok, MD;  Location: [facility];  Service: General;  Laterality: N/A;  . CORONARY ANGIOPLASTY WITH STENT PLACEMENT  12/14/11   "2"  . EYE SURGERY  ~ 2010   "both eyes; reshape pupil"  . KNEE ARTHROSCOPY  ` 2010   left  . LEFT HEART CATHETERIZATION WITH CORONARY ANGIOGRAM N/A 12/14/2011   Procedure: LEFT HEART CATHETERIZATION WITH CORONARY ANGIOGRAM;  Surgeon: Candee Furbish, MD;  Location: [facility] CATH LAB;  Service: Cardiovascular;  Laterality: N/A;  . NASAL SINUS SURGERY  ~ 2002 & 2003  . PERCUTANEOUS CORONARY STENT INTERVENTION (PCI-S)  12/14/2011   Procedure: PERCUTANEOUS CORONARY STENT INTERVENTION (PCI-S);  Surgeon: Candee Furbish, MD;  Location: [facility] CATH LAB;  Service: Cardiovascular;;  . SKIN CANCER EXCISION     "back; head; ears"  . TOTAL KNEE ARTHROPLASTY Left 05/09/2017   Procedure: LEFT TOTAL KNEE ARTHROPLASTY;  Surgeon: Latanya Maudlin, MD;  Location: [facility];  Service: Orthopedics;  Laterality: Left;    Current Outpatient Medications  Medication Sig Dispense Refill  . aspirin 81 MG EC tablet Take 1 tablet (81 mg total) by mouth daily. 30 tablet   . Cholecalciferol (VITAMIN D HIGH POTENCY) 1000 UNITS capsule Take 1,000 Units by mouth daily.    . diphenhydrAMINE (BENADRYL) 25 MG tablet Take 25-50 mg by mouth every 6 (six) hours as needed (for allergic reactions).     . EPIPEN 2-PAK 0.3 MG/0.3ML SOAJ injection Inject 0.3 mg into the muscle once as needed (for an anaphylactic reaction).     Marland Kitchen lidocaine (LIDODERM) 5 % Place 1 patch onto the skin daily as needed (for knee pain). Remove & Discard patch within 12 hours or as directed by MD     . metoprolol succinate (TOPROL-XL) 25 MG 24 hr tablet Take 1 tablet (25 mg total) by mouth daily. 90 tablet 3  . nitroGLYCERIN (NITROSTAT) 0.4 MG SL tablet PLACE 1 TABLET UNDER TONGUE EVERY 5 MINUTES AS NEEDED FOR CHEST PAIN 25 tablet 3  . pantoprazole (PROTONIX) 40 MG tablet Take 1 tablet (40 mg total) by mouth daily at 12 noon. 90 tablet 2  . PREVIDENT 5000 SENSITIVE 6.9-4 % PSTE 1 application 2 (two) times daily.   6  . tamsulosin (FLOMAX) 0.4 MG CAPS capsule Take 1 capsule (0.4 mg total) by mouth daily. 30 capsule 0  . traMADol (ULTRAM) 50 MG tablet Take 50 mg by mouth every 4 (four) hours as needed (for pain).   2   No current facility-administered medications for this visit.     Allergies:    Allergies    Allergen Reactions  . Beef-Derived Products Anaphylaxis, Hives, Shortness Of Breath and Rash    Because of a tick bite, the patient now has "Alpha-gal allergy"  . Pork-Derived Products Anaphylaxis, Hives, Shortness Of Breath and Rash    Because of a tick bite, the patient now has "Alpha-gal allergy"  . Codeine Other (See Comments)    Surgeon advised against this  . Crestor [Rosuvastatin] Other (See Comments)    Muscle aches  . Dilaudid [Hydromorphone Hcl] Hives    All-over hives  . Hydrocodone Itching  .  Lipitor [Atorvastatin] Other (See Comments)    Muscle aches   . Percocet [Oxycodone-Acetaminophen] Itching  . Pitavastatin Other (See Comments)    Leg pain    Social History:  The patient  reports that he has quit smoking. His smoking use included cigarettes. He has a 24.00 pack-year smoking history. He has never used smokeless tobacco. He reports current alcohol use. He reports that he does not use drugs.   ROS:  Please see the history of present illness.  All other ROS neg.    PHYSICAL EXAM: VS:  BP (!) 144/76   Pulse 70   Ht 6\' 1"  (1.854 m)   Wt 198 lb 3.2 oz (89.9 kg)   SpO2 99%   BMI 26.15 kg/m  GEN: Well nourished, well developed, in no acute distress  HEENT: normal  Neck: no JVD, carotid bruits, or masses Cardiac: RRR; no murmurs, rubs, or gallops,no edema  Respiratory:  clear to auscultation bilaterally, normal work of breathing GI: soft, nontender, nondistended, + BS MS: no deformity or atrophy  Skin: warm and dry, no rash Neuro:  Alert and Oriented x 3, Strength and sensation are intact Psych: euthymic mood, full affect    EKG:  EKG was ordered today.  12/17/2018-sinus rhythm 70 with PAC no ischemic changes personally reviewed and interpreted 05/04/2016-sinus rhythm, 66, no other abnormalities. 06/08/14 -Sinus rhythm rate 80, no other abnormalities.   Labs: 11/26/13-LDL 86, ALT 27  Nuclear stress test: 2013-inferior wall infarct/ ischemia-EF 44%  05/2014 -  mild inferior inferior ischemia.   Cath 05/09/16 Conclusion    Widely patent circumflex stents previously placed  No other angiographically significant coronary artery disease present.  Normal left ventriculogram, no mitral regurgitation, no aortic stenosis, EF 65%   Candee Furbish, MD Continue with medical therapy, reassurance     ASSESSMENT AND PLAN:  1. Coronary artery disease-prior circumflex stenting. Aspirin. Prior stent 2013. Reassuring cardiac catheterization June 2017. Angina/exertional angina -fatigue,  shortness of breath. This seems to be improved Toprol 25. He did not tolerate isosorbide 15 mg because of headache. He even tried to take it at night. He will now take this on as-needed basis.  The best thing to prevent future heart attacks are diet, exercise, statin medications. Has tried Crestor, Livalo 2mg  which is a better tolerated statin, did not tolerate well, leg pains. Statin intolerance. Not interested in injectable. Now taking supplement.  Asked me about an omega-3 supplement.  Interested in taking.  Mildly elevated blood pressure but usually under good control. 2. Fatigue-certainly could be multifactorial. Reassuring heart cath 2017, wonders if he has fibromyalgia. 3. Old myocardial infarction-previously silent.  Overall doing well 4. Hyperlipidemia - Did not tolerate Crestor, Livalo 2mg  twice a week. Continue to encourage diet and exercise. Could try Zetia but not interested.   No changes as above. 5. Left Knee replacement. Stable.  2018.  Feels good. 6. One-year follow-up  Signed, Candee Furbish, MD [facility]  12/17/2018 10:28 AM

## 2018-12-17 NOTE — Patient Instructions (Signed)
Medication Instructions:  The current medical regimen is effective;  continue present plan and medications.  If you need a refill on your cardiac medications before your next appointment, please call your pharmacy.   Follow-Up: At [facility], you and your health needs are our priority.  As part of our continuing mission to provide you with exceptional heart care, we have created designated Provider Care Teams.  These Care Teams include your primary Cardiologist (physician) and Advanced Practice Providers (APPs -  Physician Assistants and Nurse Practitioners) who all work together to provide you with the care you need, when you need it. You will need a follow up appointment in 12 months.  Please call our office 2 months in advance to schedule this appointment.  You may see Mark Skains, MD or one of the following Advanced Practice Providers on your designated Care Team:   Lori Gerhardt, NP Laura Ingold, NP . Jill McDaniel, NP  Thank you for choosing [facility]!!      

## 2019-01-02 DIAGNOSIS — J209 Acute bronchitis, unspecified: Secondary | ICD-10-CM | POA: Diagnosis not present

## 2019-01-02 DIAGNOSIS — J0100 Acute maxillary sinusitis, unspecified: Secondary | ICD-10-CM | POA: Diagnosis not present

## 2019-02-10 ENCOUNTER — Other Ambulatory Visit: Payer: Self-pay | Admitting: Cardiology

## 2019-03-04 DIAGNOSIS — L570 Actinic keratosis: Secondary | ICD-10-CM | POA: Diagnosis not present

## 2019-03-04 DIAGNOSIS — C44329 Squamous cell carcinoma of skin of other parts of face: Secondary | ICD-10-CM | POA: Diagnosis not present

## 2019-05-22 DIAGNOSIS — J0100 Acute maxillary sinusitis, unspecified: Secondary | ICD-10-CM | POA: Diagnosis not present

## 2019-06-27 DIAGNOSIS — R109 Unspecified abdominal pain: Secondary | ICD-10-CM | POA: Diagnosis not present

## 2019-06-30 ENCOUNTER — Other Ambulatory Visit: Payer: Self-pay | Admitting: Internal Medicine

## 2019-06-30 DIAGNOSIS — R748 Abnormal levels of other serum enzymes: Secondary | ICD-10-CM

## 2019-07-01 DIAGNOSIS — J3489 Other specified disorders of nose and nasal sinuses: Secondary | ICD-10-CM | POA: Diagnosis not present

## 2019-07-03 ENCOUNTER — Ambulatory Visit
Admission: RE | Admit: 2019-07-03 | Discharge: 2019-07-03 | Disposition: A | Discharge status: Home / Self Care | Payer: Medicare Other | Source: Ambulatory Visit | Attending: Internal Medicine | Admitting: Internal Medicine

## 2019-07-03 DIAGNOSIS — N281 Cyst of kidney, acquired: Secondary | ICD-10-CM | POA: Diagnosis not present

## 2019-07-03 DIAGNOSIS — N1330 Unspecified hydronephrosis: Secondary | ICD-10-CM | POA: Diagnosis not present

## 2019-07-03 DIAGNOSIS — R748 Abnormal levels of other serum enzymes: Secondary | ICD-10-CM

## 2019-07-17 DIAGNOSIS — M170 Bilateral primary osteoarthritis of knee: Secondary | ICD-10-CM | POA: Diagnosis not present

## 2019-07-17 DIAGNOSIS — I2510 Atherosclerotic heart disease of native coronary artery without angina pectoris: Secondary | ICD-10-CM | POA: Diagnosis not present

## 2019-07-17 DIAGNOSIS — E782 Mixed hyperlipidemia: Secondary | ICD-10-CM | POA: Diagnosis not present

## 2019-07-17 DIAGNOSIS — I208 Other forms of angina pectoris: Secondary | ICD-10-CM | POA: Diagnosis not present

## 2019-07-22 DIAGNOSIS — J0100 Acute maxillary sinusitis, unspecified: Secondary | ICD-10-CM | POA: Diagnosis not present

## 2019-07-24 DIAGNOSIS — I2510 Atherosclerotic heart disease of native coronary artery without angina pectoris: Secondary | ICD-10-CM | POA: Diagnosis not present

## 2019-07-24 DIAGNOSIS — I208 Other forms of angina pectoris: Secondary | ICD-10-CM | POA: Diagnosis not present

## 2019-07-24 DIAGNOSIS — M170 Bilateral primary osteoarthritis of knee: Secondary | ICD-10-CM | POA: Diagnosis not present

## 2019-07-24 DIAGNOSIS — E782 Mixed hyperlipidemia: Secondary | ICD-10-CM | POA: Diagnosis not present

## 2019-07-25 DIAGNOSIS — K5730 Diverticulosis of large intestine without perforation or abscess without bleeding: Secondary | ICD-10-CM | POA: Diagnosis not present

## 2019-07-25 DIAGNOSIS — K219 Gastro-esophageal reflux disease without esophagitis: Secondary | ICD-10-CM | POA: Diagnosis not present

## 2019-07-25 DIAGNOSIS — R1084 Generalized abdominal pain: Secondary | ICD-10-CM | POA: Diagnosis not present

## 2019-07-29 NOTE — H&P (Signed)
HPI:   Sean Hammond is a 78 y.o. male who presents as a new Patient.   Referring Provider: Self, A Referral  Chief complaint: Sinuses.  HPI: Having chronic and recurring episodes of frontal and maxillary sinus pain and pressure with chronic nasal drainage of clear watery mucus. He gets a shot of steroid and oral antibiotic periodically which provides temporary and partial relief. It has gotten worse more recently.  PMH/Meds/All/SocHx/FamHx/ROS:   History reviewed. No pertinent past medical history.  History reviewed. No pertinent surgical history.  No family history of bleeding disorders, wound healing problems or difficulty with anesthesia.   Social History   Socioeconomic History  . Marital status: Unknown  Spouse name: Not on file  . Number of children: Not on file  . Years of education: Not on file  . Highest education level: Not on file  Occupational History  . Not on file  Social Needs  . Financial resource strain: Not on file  . Food insecurity  Worry: Not on file  Inability: Not on file  . Transportation needs  Medical: Not on file  Non-medical: Not on file  Tobacco Use  . Smoking status: Former Research scientist (life sciences)  . Smokeless tobacco: Never Used  Substance and Sexual Activity  . Alcohol use: Not on file  . Drug use: Not on file  . Sexual activity: Not on file  Lifestyle  . Physical activity  Days per week: Not on file  Minutes per session: Not on file  . Stress: Not on file  Relationships  . Social Medical illustrator on phone: Not on file  Gets together: Not on file  Attends religious service: Not on file  Active member of club or organization: Not on file  Attends meetings of clubs or organizations: Not on file  Relationship status: Not on file  Other Topics Concern  . Not on file  Social History Narrative  . Not on file   Current Outpatient Medications:  . aspirin 81 MG chewable tablet *ANTIPLATELET*, Take 81 mg by mouth daily., Disp: , Rfl:  . metoPROLOL  succinate (TOPROL-XL) 25 MG 24 hr tablet, Take 25 mg by mouth daily., Disp: , Rfl:   A complete ROS was performed with pertinent positives/negatives noted in the HPI. The remainder of the ROS are negative.   Physical Exam:   BP 170/89 (Site: Right arm)  Pulse 66  Temp 97 F (36.1 C)  Resp 18  Ht 1.854 m (6\' 1" )  Wt 92.1 kg (203 lb)  BMI 26.78 kg/m   General: Healthy and alert, in no distress, breathing easily. Normal affect. In a pleasant mood. Head: Normocephalic, atraumatic. No masses, or scars. Eyes: Pupils are equal, and reactive to light. Vision is grossly intact. No spontaneous or gaze nystagmus. Ears: Ear canals are clear. Tympanic membranes are intact, with normal landmarks and the middle ears are clear and healthy. Hearing: Grossly normal. Nose: Nasal cavities are clear with healthy mucosa, no polyps or exudate. Airways are patent. Face: No masses or scars, facial nerve function is symmetric. Oral Cavity: No mucosal abnormalities are noted. Tongue with normal mobility. Dentition appears healthy. Oropharynx: Tonsils are symmetric. There are no mucosal masses identified. Tongue base appears normal and healthy. Larynx/Hypopharynx: deferred Chest: Deferred Neck: No palpable masses, no cervical adenopathy, no thyroid nodules or enlargement. Neuro: Cranial nerves II-XII with normal function. Balance: Normal gate. Other findings: none.  Independent Review of Additional Tests or Records:  none  Procedures:  Procedure note: Flexible fiberoptic laryngoscopy  Details of the procedure were explained to the patient and all questions were answered.   Procedure:   After anesthetizing the nasal cavity with topical lidocaine and oxymetazoline, the flexible endoscope was introduced and passed through the nasal cavity into the nasopharynx. The scope was then advanced to the level of the oropharynx, then the hypopharynx and larynx. Scope number 12 was used.  Findings:   The  bilateral nasal and sinus cavities were inspected.   Additional findings: Left ethmoid and frontal recess are completely clear and patent. On the right side the ethmoid is mostly clear but the frontal recess is obstructed by a smooth mucosal covered mass which may be a mucocele or polyp.  The scope was withdrawn from the nose. He tolerated the procedure well.   Impression & Plans:  Right frontal recess obstruction. Recommend CT imaging to further evaluate this.  CT reveals all sinuses are clear and patent except for the one opacity of the right anterior ethmoid. I think this may be causing intermittent obstruction of the frontal sinus and he could benefit by having this cleared out. We will schedule as soon as he is able to.

## 2019-08-05 ENCOUNTER — Encounter (HOSPITAL_BASED_OUTPATIENT_CLINIC_OR_DEPARTMENT_OTHER): Payer: Self-pay | Admitting: *Deleted

## 2019-08-05 ENCOUNTER — Other Ambulatory Visit: Payer: Self-pay

## 2019-08-07 ENCOUNTER — Other Ambulatory Visit (HOSPITAL_COMMUNITY): Admission: RE | Admit: 2019-08-07 | Payer: Medicare Other | Source: Ambulatory Visit

## 2019-08-08 ENCOUNTER — Telehealth: Payer: Self-pay | Admitting: Cardiology

## 2019-08-08 NOTE — Telephone Encounter (Signed)
I s/w Sean Hammond at Dr. Merry Proud Rosen's office and confirmed surgery clearance information.       [facility] Pre-operative Risk Assessment    Request for surgical clearance:  1. What type of surgery is being performed? SINUS ETHMOIDECTOMY    2. When is this surgery scheduled? 09/01/19   3. What type of clearance is required (medical clearance vs. Pharmacy clearance to hold med vs. Both)? MEDICAL  4. Are there any medications that need to be held prior to surgery and how long? ASA; WILL LET CARDIOLOGIST DECIDE HOW MANY DAYS TO HOLD ASA   5. Practice name and name of physician performing surgery? DR. JEFF ROSEN   6. What is your office phone number [phone]   7.   What is your office fax number [phone]; PLEASE PUT ATTN: Sean Hammond  8.   Anesthesia type (None, local, MAC, general) ? GENERAL   Julaine Hua 08/08/2019, 3:59 PM  _________________________________________________________________   (provider comments below)

## 2019-08-08 NOTE — Telephone Encounter (Signed)
Dr. [name] can you please address this patient's aspirin therapy?  He is for a sinus ethmoidectomy on 09/01/2019.  Requesting surgical team is asking to hold ASA at our discretion.  You last saw him 11/2018.  He has a history of coronary artery disease s/p overlapping drug eluting stents to his mid circumflex with prior ejection fraction of 44%. These were placed in January of 2013. CPAP for OSA. Prior nuclear stress test showed base to mid inferior wall ischemia in 2013. Last cardiac catheterization in June 2017 which showed patent circumflex stent and normal coronary arteries otherwise. Normal EF.  Please route your recommendations to the preoperative pool  Thank you Sharee Pimple

## 2019-08-08 NOTE — Telephone Encounter (Signed)
Follow Up:     Sonia from Dr Janeice Robinson Office called. She was checking on the status of pt's clearance that was faxed over on 07-22-19. Please fax this back to [phone].

## 2019-08-08 NOTE — Telephone Encounter (Signed)
Per chart review, it does not appear that we ever received cardiac clearance request form.  Could someone please contact their office and ask them to resend the form?

## 2019-08-11 ENCOUNTER — Encounter (HOSPITAL_BASED_OUTPATIENT_CLINIC_OR_DEPARTMENT_OTHER): Payer: Self-pay | Admitting: *Deleted

## 2019-08-11 ENCOUNTER — Other Ambulatory Visit: Payer: Self-pay

## 2019-08-11 ENCOUNTER — Emergency Department (HOSPITAL_BASED_OUTPATIENT_CLINIC_OR_DEPARTMENT_OTHER): Payer: Medicare Other

## 2019-08-11 ENCOUNTER — Observation Stay (HOSPITAL_BASED_OUTPATIENT_CLINIC_OR_DEPARTMENT_OTHER)
Admission: EM | Admit: 2019-08-11 | Discharge: 2019-08-12 | Disposition: A | Discharge status: Home / Self Care | Payer: Medicare Other | Attending: Family Medicine | Admitting: Family Medicine

## 2019-08-11 DIAGNOSIS — N179 Acute kidney failure, unspecified: Principal | ICD-10-CM | POA: Diagnosis present

## 2019-08-11 DIAGNOSIS — N1330 Unspecified hydronephrosis: Secondary | ICD-10-CM | POA: Diagnosis not present

## 2019-08-11 DIAGNOSIS — Z888 Allergy status to other drugs, medicaments and biological substances status: Secondary | ICD-10-CM | POA: Diagnosis not present

## 2019-08-11 DIAGNOSIS — G4733 Obstructive sleep apnea (adult) (pediatric): Secondary | ICD-10-CM | POA: Insufficient documentation

## 2019-08-11 DIAGNOSIS — Z20828 Contact with and (suspected) exposure to other viral communicable diseases: Secondary | ICD-10-CM | POA: Diagnosis not present

## 2019-08-11 DIAGNOSIS — N12 Tubulo-interstitial nephritis, not specified as acute or chronic: Secondary | ICD-10-CM

## 2019-08-11 DIAGNOSIS — Z87891 Personal history of nicotine dependence: Secondary | ICD-10-CM | POA: Diagnosis not present

## 2019-08-11 DIAGNOSIS — Z7982 Long term (current) use of aspirin: Secondary | ICD-10-CM | POA: Insufficient documentation

## 2019-08-11 DIAGNOSIS — I2510 Atherosclerotic heart disease of native coronary artery without angina pectoris: Secondary | ICD-10-CM | POA: Diagnosis not present

## 2019-08-11 DIAGNOSIS — E785 Hyperlipidemia, unspecified: Secondary | ICD-10-CM | POA: Diagnosis not present

## 2019-08-11 DIAGNOSIS — Z8249 Family history of ischemic heart disease and other diseases of the circulatory system: Secondary | ICD-10-CM | POA: Diagnosis not present

## 2019-08-11 DIAGNOSIS — N136 Pyonephrosis: Secondary | ICD-10-CM | POA: Insufficient documentation

## 2019-08-11 DIAGNOSIS — R109 Unspecified abdominal pain: Secondary | ICD-10-CM

## 2019-08-11 DIAGNOSIS — K219 Gastro-esophageal reflux disease without esophagitis: Secondary | ICD-10-CM | POA: Diagnosis not present

## 2019-08-11 DIAGNOSIS — Z85828 Personal history of other malignant neoplasm of skin: Secondary | ICD-10-CM | POA: Diagnosis not present

## 2019-08-11 DIAGNOSIS — Z79899 Other long term (current) drug therapy: Secondary | ICD-10-CM | POA: Diagnosis not present

## 2019-08-11 DIAGNOSIS — Z955 Presence of coronary angioplasty implant and graft: Secondary | ICD-10-CM | POA: Insufficient documentation

## 2019-08-11 DIAGNOSIS — Z03818 Encounter for observation for suspected exposure to other biological agents ruled out: Secondary | ICD-10-CM | POA: Diagnosis not present

## 2019-08-11 DIAGNOSIS — Z96652 Presence of left artificial knee joint: Secondary | ICD-10-CM | POA: Insufficient documentation

## 2019-08-11 DIAGNOSIS — R112 Nausea with vomiting, unspecified: Secondary | ICD-10-CM

## 2019-08-11 DIAGNOSIS — K5730 Diverticulosis of large intestine without perforation or abscess without bleeding: Secondary | ICD-10-CM | POA: Diagnosis not present

## 2019-08-11 DIAGNOSIS — Z885 Allergy status to narcotic agent status: Secondary | ICD-10-CM | POA: Diagnosis not present

## 2019-08-11 DIAGNOSIS — N10 Acute tubulo-interstitial nephritis: Secondary | ICD-10-CM | POA: Diagnosis present

## 2019-08-11 DIAGNOSIS — I252 Old myocardial infarction: Secondary | ICD-10-CM | POA: Insufficient documentation

## 2019-08-11 DIAGNOSIS — I25110 Atherosclerotic heart disease of native coronary artery with unstable angina pectoris: Secondary | ICD-10-CM | POA: Diagnosis present

## 2019-08-11 DIAGNOSIS — N2889 Other specified disorders of kidney and ureter: Secondary | ICD-10-CM | POA: Diagnosis not present

## 2019-08-11 LAB — COMPREHENSIVE METABOLIC PANEL
ALT: 19 U/L (ref 0–44)
AST: 25 U/L (ref 15–41)
Albumin: 3.9 g/dL (ref 3.5–5.0)
Alkaline Phosphatase: 55 U/L (ref 38–126)
Anion gap: 9 (ref 5–15)
BUN: 26 mg/dL — ABNORMAL HIGH (ref 8–23)
CO2: 25 mmol/L (ref 22–32)
Calcium: 8.8 mg/dL — ABNORMAL LOW (ref 8.9–10.3)
Chloride: 103 mmol/L (ref 98–111)
Creatinine, Ser: 2.11 mg/dL — ABNORMAL HIGH (ref 0.61–1.24)
GFR calc Af Amer: 34 mL/min — ABNORMAL LOW (ref >60)
GFR calc non Af Amer: 30 mL/min — ABNORMAL LOW (ref >60)
Glucose, Bld: 195 mg/dL — ABNORMAL HIGH (ref 70–99)
Potassium: 4.6 mmol/L (ref 3.5–5.1)
Sodium: 137 mmol/L (ref 135–145)
Total Bilirubin: 0.5 mg/dL (ref 0.3–1.2)
Total Protein: 6.7 g/dL (ref 6.5–8.1)

## 2019-08-11 LAB — CBC WITH DIFFERENTIAL/PLATELET
Abs Immature Granulocytes: 0.03 10*3/uL (ref 0.00–0.07)
Basophils Absolute: 0.1 10*3/uL (ref 0.0–0.1)
Basophils Relative: 1 %
Eosinophils Absolute: 0.1 10*3/uL (ref 0.0–0.5)
Eosinophils Relative: 1 %
HCT: 44 % (ref 39.0–52.0)
Hemoglobin: 14.6 g/dL (ref 13.0–17.0)
Immature Granulocytes: 0 %
Lymphocytes Relative: 15 %
Lymphs Abs: 1.4 10*3/uL (ref 0.7–4.0)
MCH: 31 pg (ref 26.0–34.0)
MCHC: 33.2 g/dL (ref 30.0–36.0)
MCV: 93.4 fL (ref 80.0–100.0)
Monocytes Absolute: 0.5 10*3/uL (ref 0.1–1.0)
Monocytes Relative: 6 %
Neutro Abs: 7.3 10*3/uL (ref 1.7–7.7)
Neutrophils Relative %: 77 %
Platelets: 173 10*3/uL (ref 150–400)
RBC: 4.71 MIL/uL (ref 4.22–5.81)
RDW: 14.5 % (ref 11.5–15.5)
WBC: 9.3 10*3/uL (ref 4.0–10.5)
nRBC: 0 % (ref 0.0–0.2)

## 2019-08-11 LAB — URINALYSIS, MICROSCOPIC (REFLEX)
RBC / HPF: >50 RBC/hpf (ref 0–5)
WBC, UA: NONE SEEN WBC/hpf (ref 0–5)

## 2019-08-11 LAB — URINALYSIS, ROUTINE W REFLEX MICROSCOPIC
Bilirubin Urine: NEGATIVE
Glucose, UA: NEGATIVE mg/dL
Ketones, ur: 15 mg/dL — AB
Leukocytes,Ua: NEGATIVE
Nitrite: NEGATIVE
Protein, ur: NEGATIVE mg/dL
Specific Gravity, Urine: >1.03 — ABNORMAL HIGH (ref 1.005–1.030)
pH: 5.5 (ref 5.0–8.0)

## 2019-08-11 LAB — TROPONIN I (HIGH SENSITIVITY): Troponin I (High Sensitivity): 5 ng/L (ref <18)

## 2019-08-11 MED ORDER — FENTANYL CITRATE (PF) 100 MCG/2ML IJ SOLN
50.0000 ug | INTRAMUSCULAR | Status: DC | PRN
  Administered 2019-08-11: 22:00:00 50 ug via INTRAVENOUS
  Filled 2019-08-11: qty 2

## 2019-08-11 MED ORDER — FENTANYL CITRATE (PF) 100 MCG/2ML IJ SOLN
50.0000 ug | Freq: Once | INTRAMUSCULAR | Status: AC
  Administered 2019-08-11: 23:00:00 50 ug via INTRAVENOUS
  Filled 2019-08-11: qty 2

## 2019-08-11 MED ORDER — SODIUM CHLORIDE 0.9 % IV SOLN
1.0000 g | Freq: Once | INTRAVENOUS | Status: AC
  Administered 2019-08-11: 23:00:00 1 g via INTRAVENOUS
  Filled 2019-08-11: qty 10

## 2019-08-11 MED ORDER — SODIUM CHLORIDE 0.9 % IV SOLN
INTRAVENOUS | Status: DC | PRN
  Administered 2019-08-11: 500 mL via INTRAVENOUS

## 2019-08-11 MED ORDER — ONDANSETRON HCL 4 MG/2ML IJ SOLN
4.0000 mg | Freq: Once | INTRAMUSCULAR | Status: AC
  Administered 2019-08-11: 22:00:00 4 mg via INTRAVENOUS
  Filled 2019-08-11: qty 2

## 2019-08-11 NOTE — ED Triage Notes (Addendum)
Right flank pain this evening. Pt is restless. States yesterday he had chest pain and often has the same. No chest pain on arrival to the ED but he has a cardiac hx. EKG done at triage.

## 2019-08-11 NOTE — ED Provider Notes (Signed)
[facility] EMERGENCY DEPARTMENT Provider Note   CSN: [id number] Arrival date & time: 08/11/19  2107     History   Chief Complaint Chief Complaint  Patient presents with   Flank Pain    HPI Sean Hammond is a 78 y.o. male.     The history is provided by the patient and medical records. No language interpreter was used.  Flank Pain This is a new problem. The current episode started 3 to 5 hours ago. The problem occurs constantly. The problem has not changed since onset.Associated symptoms include abdominal pain. Pertinent negatives include no chest pain, no headaches and no shortness of breath. Nothing aggravates the symptoms. Nothing relieves the symptoms. He has tried nothing for the symptoms.    Past Medical History:  Diagnosis Date   Allergic rhinitis    CAD (coronary artery disease)    a. s/p DES to LCx x 2 in 2013 // b. Myoview 7/15: inferior ischemia, EF 52%, low risk  //  c. LHC 6/17: LCx stents patent, no sig CAD elsewhere, EF 65% >> Med Rx   Chronic headaches    "started  09/2011"   ED (erectile dysfunction)    Epicondylitis    Epistaxis    Family history of adverse reaction to anesthesia    wife has nausea with anesthesia    GERD (gastroesophageal reflux disease)    History of echocardiogram    a. Echo 5/16: mild LVH, EF 55-60%, no RWMA, PASP 33 mmHg   Hyperlipidemia    Insomnia    , post   Leukopenia    Lung nodule    , Left upper   Moderate obstructive sleep apnea    not used cpap since 2014 bought bed that head would elevate    Osteoarthritis of hand    PONV (postoperative nausea and vomiting)    Prostatitis    Silent myocardial infarction ([place])    Skin cancer    head, ear, back    Patient Active Problem List   Diagnosis Date Noted   H/O total knee replacement, left 05/09/2017   Allergy with anaphylaxis due to food 10/18/2015   Near syncope 03/21/2015   RUQ abdominal pain 03/21/2015   Old MI (myocardial  infarction) 12/02/2013   Shortness of breath on exertion    Hyperlipidemia    Moderate obstructive sleep apnea    Silent myocardial infarction [facility])    Coronary atherosclerosis of native coronary artery 12/14/2011    Past Surgical History:  Procedure Laterality Date   BONE CYST EXCISION  ~ 2004   left knee   CARDIAC CATHETERIZATION N/A 05/09/2016   Procedure: Left Heart Cath and Coronary Angiography;  Surgeon: Jerline Pain, MD;  Location: [facility] CV LAB;  Service: Cardiovascular;  Laterality: N/A;   CHOLECYSTECTOMY N/A 03/23/2015   Procedure: LAPAROSCOPIC CHOLECYSTECTOMY;  Surgeon: Ralene Ok, MD;  Location: [facility] ORS;  Service: General;  Laterality: N/A;   CORONARY ANGIOPLASTY WITH STENT PLACEMENT  12/14/11   "2"   EYE SURGERY  ~ 2010   "both eyes; reshape pupil"   KNEE ARTHROSCOPY  ` 2010   left   LEFT HEART CATHETERIZATION WITH CORONARY ANGIOGRAM N/A 12/14/2011   Procedure: LEFT HEART CATHETERIZATION WITH CORONARY ANGIOGRAM;  Surgeon: Candee Furbish, MD;  Location: [facility] CATH LAB;  Service: Cardiovascular;  Laterality: N/A;   NASAL SINUS SURGERY  ~ 2002 & 2003   PERCUTANEOUS CORONARY STENT INTERVENTION (PCI-S)  12/14/2011   Procedure: PERCUTANEOUS CORONARY STENT INTERVENTION (PCI-S);  Surgeon: Candee Furbish, MD;  Location: [facility] C[facility] LAB;  Service: Cardiovascular;;   SKIN CANCER EXCISION     "back; head; ears"   TOTAL KNEE ARTHROPLASTY Left 05/09/2017   Procedure: LEFT TOTAL KNEE ARTHROPLASTY;  Surgeon: Latanya Maudlin, MD;  Location: [facility] ORS;  Service: Orthopedics;  Laterality: Left;        Home Medications    Prior to Admission medications   Medication Sig Start Date End Date Taking? Authorizing Provider  aspirin 81 MG EC tablet Take 1 tablet (81 mg total) by mouth daily. 12/27/17   Jerline Pain, MD  Cholecalciferol (VITAMIN D HIGH POTENCY) 1000 UNITS capsule Take 1,000 Units by mouth daily.    [provider]  diphenhydrAMINE (BENADRYL) 25 MG tablet Take  25-50 mg by mouth every 6 (six) hours as needed (for allergic reactions).     [provider]  EPIPEN 2-PAK 0.3 MG/0.3ML SOAJ injection Inject 0.3 mg into the muscle once as needed (for an anaphylactic reaction).  03/17/14   [provider]  lidocaine (LIDODERM) 5 % Place 1 patch onto the skin daily as needed (for knee pain). Remove & Discard patch within 12 hours or as directed by MD     [provider]  metoprolol succinate (TOPROL-XL) 25 MG 24 hr tablet TAKE 1 TABLET BY MOUTH EVERY DAY 02/11/19   Jerline Pain, MD  nitroGLYCERIN (NITROSTAT) 0.4 MG SL tablet PLACE 1 TABLET UNDER TONGUE EVERY 5 MINUTES AS NEEDED FOR CHEST PAIN 07/23/18   Jerline Pain, MD  pantoprazole (PROTONIX) 40 MG tablet TAKE 1 TABLET BY MOUTH DAILY AT 12 NOON. 02/11/19   Jerline Pain, MD  PREVIDENT 5000 SENSITIVE [id number] % PSTE 1 application 2 (two) times daily.  11/24/15   [provider]  tamsulosin (FLOMAX) 0.4 MG CAPS capsule Take 1 capsule (0.4 mg total) by mouth daily. 05/09/17   Constable, Amber, PA-C  traMADol (ULTRAM) 50 MG tablet Take 50 mg by mouth every 4 (four) hours as needed (for pain).  04/25/17   [provider]    Family History Family History  Problem Relation Age of Onset   Heart attack Mother    Hypertension Mother     Social History Social History   Tobacco Use   Smoking status: Former Smoker    Packs/day: 2.00    Years: 12.00    Pack years: 24.00    Types: Cigarettes   Smokeless tobacco: Never Used   Tobacco comment: "quit smoking cigarettes 1975"  Substance Use Topics   Alcohol use: Not Currently    Comment: "last beer 1969"   Drug use: No     Allergies   Beef-derived products, Pork-derived products, Codeine, Crestor [rosuvastatin], Dilaudid [hydromorphone hcl], Hydrocodone, Lipitor [atorvastatin], Percocet [oxycodone-acetaminophen], and Pitavastatin   Review of Systems Review of Systems  Constitutional: Negative for chills,  diaphoresis, fatigue and fever.  HENT: Negative for congestion.   Eyes: Negative for visual disturbance.  Respiratory: Negative for cough, chest tightness, shortness of breath and wheezing.   Cardiovascular: Negative for chest pain.  Gastrointestinal: Positive for abdominal pain, nausea and vomiting. Negative for constipation and diarrhea.  Genitourinary: Positive for flank pain and hematuria. Negative for decreased urine volume, dysuria, frequency, penile pain and testicular pain.  Musculoskeletal: Positive for back pain. Negative for neck pain and neck stiffness.  Neurological: Negative for headaches.  Psychiatric/Behavioral: Negative for agitation.  All other systems reviewed and are negative.    Physical Exam Updated Vital Signs BP Marland Kitchen)  162/94 (BP Location: Left Arm)    Pulse 73    Temp 97.9 F (36.6 C) (Oral)    Resp 18    Ht 6\' 1"  (1.854 m)    Wt 93 kg    SpO2 97%    BMI 27.05 kg/m   Physical Exam Vitals signs and nursing note reviewed.  Constitutional:      General: He is not in acute distress.    Appearance: He is well-developed. He is not ill-appearing, toxic-appearing or diaphoretic.  HENT:     Head: Normocephalic and atraumatic.     Mouth/Throat:     Mouth: Mucous membranes are moist.     Pharynx: No oropharyngeal exudate or posterior oropharyngeal erythema.  Eyes:     Conjunctiva/sclera: Conjunctivae normal.     Pupils: Pupils are equal, round, and reactive to light.  Neck:     Musculoskeletal: Neck supple.  Cardiovascular:     Rate and Rhythm: Normal rate and regular rhythm.     Pulses: Normal pulses.     Heart sounds: No murmur.  Pulmonary:     Effort: Pulmonary effort is normal. No respiratory distress.     Breath sounds: Normal breath sounds. No wheezing, rhonchi or rales.  Chest:     Chest wall: No tenderness.  Abdominal:     General: Abdomen is flat. Bowel sounds are normal. There is no distension.     Palpations: Abdomen is soft. There is no mass.      Tenderness: There is no abdominal tenderness. There is no right CVA tenderness, left CVA tenderness or rebound.    Musculoskeletal:        General: Tenderness present.     Right lower leg: No edema.     Left lower leg: No edema.  Skin:    General: Skin is warm and dry.     Capillary Refill: Capillary refill takes less than 2 seconds.  Neurological:     Mental Status: He is alert.      ED Treatments / Results  Labs (all labs ordered are listed, but only abnormal results are displayed) Labs Reviewed  URINALYSIS, ROUTINE W REFLEX MICROSCOPIC - Abnormal; Notable for the following components:      Result Value   Specific Gravity, Urine >1.030 (*)    Hgb urine dipstick LARGE (*)    Ketones, ur 15 (*)    All other components within normal limits  URINALYSIS, MICROSCOPIC (REFLEX) - Abnormal; Notable for the following components:   Bacteria, UA RARE (*)    All other components within normal limits  COMPREHENSIVE METABOLIC PANEL - Abnormal; Notable for the following components:   Glucose, Bld 195 (*)    BUN 26 (*)    Creatinine, Ser 2.11 (*)    Calcium 8.8 (*)    GFR calc non Af Amer 30 (*)    GFR calc Af Amer 34 (*)    All other components within normal limits  URINE CULTURE  SARS CORONAVIRUS 2 (TAT 6-24 HRS)  CULTURE, BLOOD (ROUTINE X 2)  CULTURE, BLOOD (ROUTINE X 2)  CBC WITH DIFFERENTIAL/PLATELET  TROPONIN I (HIGH SENSITIVITY)  TROPONIN I (HIGH SENSITIVITY)    EKG EKG Interpretation  Date/Time:  Monday August 11 2019 21:25:07 EDT Ventricular Rate:  68 PR Interval:  152 QRS Duration: 96 QT Interval:  394 QTC Calculation: 418 R Axis:   52 Text Interpretation:  Normal sinus rhythm Normal ECG When compared to prior, no significant cahnges seen.  No STEMI Confirmed by  Aleksei Goodlin, Gerald Stabs [phone]) on 08/11/2019 9:56:00 PM   Radiology Ct Renal Stone Study  Result Date: 08/11/2019 CLINICAL DATA:  78 year old male with right flank pain x2 days. EXAM: CT ABDOMEN AND PELVIS  WITHOUT CONTRAST TECHNIQUE: Multidetector CT imaging of the abdomen and pelvis was performed following the standard protocol without IV contrast. COMPARISON:  CT of the abdomen pelvis dated 11/21/2018 FINDINGS: Lower chest: The visualized lung bases are clear. No intra-abdominal free air or free fluid. Hepatobiliary: The liver is unremarkable. No intrahepatic biliary ductal dilatation. Cholecystectomy. Pancreas: There is fatty infiltration the pancreas. No active inflammatory changes or dilatation of main pancreatic duct. Spleen: Small calcified splenic granuloma. Adrenals/Urinary Tract: The adrenal glands are unremarkable. There is a mild right hydronephrosis. Mild right perinephric stranding noted. Faint high attenuating content within interpolar collecting system (series 2, image 27 and coronal series 4, image 65) may represent tiny stone or proteinaceous debris versus blood product. Clinical correlation is recommended. No obstructing stone identified. There is no hydronephrosis or nephrolithiasis on the left. A 12 mm exophytic low attenuating lesion from the inferior pole of the left kidney demonstrates fluid attenuation most likely a cyst. The urinary bladder is partially distended. There is mild thickened and trabecular appearance of bladder wall which may be related to chronic bladder outlet obstruction and underdistention. Stomach/Bowel: There is extensive sigmoid diverticulosis without active inflammatory changes. There is no bowel obstruction or active inflammation. The appendix is normal. Vascular/Lymphatic: Moderate aortoiliac atherosclerotic disease. The IVC is unremarkable. No portal venous gas. There is no adenopathy. Reproductive: The prostate gland is enlarged measuring 5.5 cm in transverse axial diameter. The seminal vesicles are symmetric. Other: None Musculoskeletal: Degenerative changes of the spine. No acute osseous pathology. IMPRESSION: 1. Mild right hydronephrosis with mild right perinephric  stranding. No obstructing stone identified. Findings may represent a recently passed right renal calculus versus pyelonephritis. Correlation with urinalysis recommended. 2. Extensive sigmoid diverticulosis. No bowel obstruction or active inflammation. Normal appendix. Aortic Atherosclerosis (ICD10-I70.0). Electronically Signed   By: Anner Crete M.D.   On: 08/11/2019 22:52    Procedures Procedures (including critical care time)  Medications Ordered in ED Medications  0.9 %  sodium chloride infusion ( Intravenous Stopped 08/12/19 0039)  ondansetron (ZOFRAN) injection 4 mg (4 mg Intravenous Given 08/11/19 2153)  cefTRIAXone (ROCEPHIN) 1 g in sodium chloride 0.9 % 100 mL IVPB ( Intravenous Stopped 08/11/19 2352)  fentaNYL (SUBLIMAZE) injection 50 mcg (50 mcg Intravenous Given 08/11/19 2316)  fentaNYL (SUBLIMAZE) injection 50 mcg (50 mcg Intravenous Given 08/12/19 0038)  sodium chloride 0.9 % bolus 1,000 mL (1,000 mLs Intravenous New Bag/Given 08/12/19 0040)     Initial Impression / Assessment and Plan / ED Course  I have reviewed the triage vital signs and the nursing notes.  Pertinent labs & imaging results that were available during my care of the patient were reviewed by me and considered in my medical decision making (see chart for details).        EMIDIO REAM is a 78 y.o. male with a past medical history significant for CAD post PCI, GERD, alpha gal allergy, hyperlipidemia, and prior cholecystectomy who presents with right flank pain.  Patient reports the pain began this evening around 6 PM in his right back.  He reports the pain radiates toward his right flank and towards his right lower quadrant.  He has never had this type of pain before.  It was initially a "15 out of 10" in severity, associated with nausea and vomiting.  He denies any trauma.  He denies any constipation, diarrhea, fevers, chills, congestion, shortness of breath, or cough.  He does report chronic intermittent chest  pain that he reports is similar to prior.  He denies current chest pressure, palpitations, or other cardiac symptoms.  He reports he had hematuria earlier today.  On exam, right CVA is not tender but right flank is slightly tender.  Right lower quadrant is nontender.  Abdomen otherwise nontender.  Lungs clear and chest nontender.  No murmur.  Legs nontender.  Patient resting comfortably with improved pain at this time.  Clinically I am primary concern about kidney stone given his location and description of symptoms.  Patient has extensive allergies to pain medications, will check kidney function before providing Toradol.  Will get CT stone study and get urine culture.  Urinalysis showed hemoglobin but no nitrites or leukocytes, doubt UTI at this time.  Had a shared decision making with patient and family and agreed to get delta troponin due to the intermittent chest discomfort.  If work-up shows kidney stone that is small enough to pass, anticipate discharge for outpatient management.  11:06 PM CT scan showed either pyelonephritis or recently passed stone with significant inflammation around the kidney.  Mild hydronephrosis seen.  Labs revealed no leukocytosis or anemia but does show acute kidney injury compared to baseline creatinine of 2.11.  Initial troponin negative.  Had a conversation with the patient's family and we agreed to give the patient fentanyl which he has tolerated in the past as well as Rocephin for possible pyelonephritis.  Given the bacteria in the urine despite no nitrites or leukocytes in the imaging findings on CT, we will treat him for possible infection.  If patient is able to tolerate eating and drinking and can maintain hydration, he will likely be stable for discharge home to treat his likely pyelonephritis with oral antibiotics and fluids.  He thinks that he is been able to tolerate Percocet with Benadryl.  If he is able to maintain hydration, will likely discharge with  Percocet with instructions use Benadryl as well as Zofran.  As there is no kidney stone present, do not feel his Flomax.  If patient is unable to maintain hydration and continue to have significant pain requiring doses of IV fentanyl, he will likely need admission for pyelonephritis causing CT abnormalities and acute kidney injury.  12:08 AM After further pain medication and antibiotics, patient is still having uncontrolled pain and is still feeling too nauseous to eat or drink any fluids.  Given the AKI and the concern for pyelonephritis without able to tolerate fluids, patient will require admission for further hydration and antibiotic management.  Patient will be admitted to [facility] long for further management.   Final Clinical Impressions(s) / ED Diagnoses   Final diagnoses:  Right flank pain  AKI (acute kidney injury) ([place])  Intractable vomiting with nausea, unspecified vomiting type  Pyelonephritis     Clinical Impression: 1. Right flank pain   2. AKI (acute kidney injury) ([place])   3. Intractable vomiting with nausea, unspecified vomiting type   4. Pyelonephritis     Disposition: Admit  This note was prepared with assistance of Dragon voice recognition software. Occasional wrong-word or sound-a-like substitutions may have occurred due to the inherent limitations of voice recognition software.     Jackqulyn Mendel, Gwenyth Allegra, MD 08/12/19 [phone]

## 2019-08-11 NOTE — ED Notes (Signed)
Patient transported to CT 

## 2019-08-12 DIAGNOSIS — N179 Acute kidney failure, unspecified: Secondary | ICD-10-CM | POA: Diagnosis not present

## 2019-08-12 DIAGNOSIS — Z7982 Long term (current) use of aspirin: Secondary | ICD-10-CM | POA: Diagnosis not present

## 2019-08-12 DIAGNOSIS — Z96652 Presence of left artificial knee joint: Secondary | ICD-10-CM | POA: Diagnosis not present

## 2019-08-12 DIAGNOSIS — I252 Old myocardial infarction: Secondary | ICD-10-CM | POA: Diagnosis not present

## 2019-08-12 DIAGNOSIS — Z87891 Personal history of nicotine dependence: Secondary | ICD-10-CM | POA: Diagnosis not present

## 2019-08-12 DIAGNOSIS — I2510 Atherosclerotic heart disease of native coronary artery without angina pectoris: Secondary | ICD-10-CM | POA: Diagnosis not present

## 2019-08-12 DIAGNOSIS — N12 Tubulo-interstitial nephritis, not specified as acute or chronic: Secondary | ICD-10-CM | POA: Diagnosis present

## 2019-08-12 DIAGNOSIS — N1330 Unspecified hydronephrosis: Secondary | ICD-10-CM | POA: Diagnosis present

## 2019-08-12 DIAGNOSIS — G4733 Obstructive sleep apnea (adult) (pediatric): Secondary | ICD-10-CM | POA: Diagnosis not present

## 2019-08-12 DIAGNOSIS — Z955 Presence of coronary angioplasty implant and graft: Secondary | ICD-10-CM | POA: Diagnosis not present

## 2019-08-12 DIAGNOSIS — N10 Acute tubulo-interstitial nephritis: Secondary | ICD-10-CM | POA: Diagnosis present

## 2019-08-12 DIAGNOSIS — K219 Gastro-esophageal reflux disease without esophagitis: Secondary | ICD-10-CM | POA: Diagnosis not present

## 2019-08-12 DIAGNOSIS — Z85828 Personal history of other malignant neoplasm of skin: Secondary | ICD-10-CM | POA: Diagnosis not present

## 2019-08-12 DIAGNOSIS — N136 Pyonephrosis: Secondary | ICD-10-CM | POA: Diagnosis not present

## 2019-08-12 DIAGNOSIS — Z20828 Contact with and (suspected) exposure to other viral communicable diseases: Secondary | ICD-10-CM | POA: Diagnosis not present

## 2019-08-12 DIAGNOSIS — E785 Hyperlipidemia, unspecified: Secondary | ICD-10-CM | POA: Diagnosis not present

## 2019-08-12 DIAGNOSIS — Z885 Allergy status to narcotic agent status: Secondary | ICD-10-CM | POA: Diagnosis not present

## 2019-08-12 DIAGNOSIS — Z888 Allergy status to other drugs, medicaments and biological substances status: Secondary | ICD-10-CM | POA: Diagnosis not present

## 2019-08-12 DIAGNOSIS — Z8249 Family history of ischemic heart disease and other diseases of the circulatory system: Secondary | ICD-10-CM | POA: Diagnosis not present

## 2019-08-12 DIAGNOSIS — R109 Unspecified abdominal pain: Secondary | ICD-10-CM | POA: Diagnosis present

## 2019-08-12 DIAGNOSIS — Z79899 Other long term (current) drug therapy: Secondary | ICD-10-CM | POA: Diagnosis not present

## 2019-08-12 LAB — COMPREHENSIVE METABOLIC PANEL
ALT: 17 U/L (ref 0–44)
AST: 23 U/L (ref 15–41)
Albumin: 3.5 g/dL (ref 3.5–5.0)
Alkaline Phosphatase: 48 U/L (ref 38–126)
Anion gap: 5 (ref 5–15)
BUN: 24 mg/dL — ABNORMAL HIGH (ref 8–23)
CO2: 23 mmol/L (ref 22–32)
Calcium: 8.3 mg/dL — ABNORMAL LOW (ref 8.9–10.3)
Chloride: 109 mmol/L (ref 98–111)
Creatinine, Ser: 1.57 mg/dL — ABNORMAL HIGH (ref 0.61–1.24)
GFR calc Af Amer: 49 mL/min — ABNORMAL LOW (ref >60)
GFR calc non Af Amer: 42 mL/min — ABNORMAL LOW (ref >60)
Glucose, Bld: 144 mg/dL — ABNORMAL HIGH (ref 70–99)
Potassium: 4.2 mmol/L (ref 3.5–5.1)
Sodium: 137 mmol/L (ref 135–145)
Total Bilirubin: 0.2 mg/dL — ABNORMAL LOW (ref 0.3–1.2)
Total Protein: 5.7 g/dL — ABNORMAL LOW (ref 6.5–8.1)

## 2019-08-12 LAB — CBC
HCT: 40.2 % (ref 39.0–52.0)
Hemoglobin: 13.6 g/dL (ref 13.0–17.0)
MCH: 31.8 pg (ref 26.0–34.0)
MCHC: 33.8 g/dL (ref 30.0–36.0)
MCV: 93.9 fL (ref 80.0–100.0)
Platelets: 142 10*3/uL — ABNORMAL LOW (ref 150–400)
RBC: 4.28 MIL/uL (ref 4.22–5.81)
RDW: 14.5 % (ref 11.5–15.5)
WBC: 10.9 10*3/uL — ABNORMAL HIGH (ref 4.0–10.5)
nRBC: 0 % (ref 0.0–0.2)

## 2019-08-12 LAB — SARS CORONAVIRUS 2 (TAT 6-24 HRS): SARS Coronavirus 2: NEGATIVE

## 2019-08-12 LAB — TROPONIN I (HIGH SENSITIVITY): Troponin I (High Sensitivity): 5 ng/L (ref <18)

## 2019-08-12 MED ORDER — ONDANSETRON HCL 4 MG PO TABS: 4.0000 mg | ORAL_TABLET | Freq: Four times a day (QID) | ORAL | 0 refills | Status: DC | PRN

## 2019-08-12 MED ORDER — TRAMADOL HCL 50 MG PO TABS: 50.0000 mg | ORAL_TABLET | Freq: Four times a day (QID) | ORAL | 0 refills | Status: AC | PRN

## 2019-08-12 MED ORDER — FENTANYL CITRATE (PF) 100 MCG/2ML IJ SOLN
25.0000 ug | INTRAMUSCULAR | Status: DC | PRN
  Administered 2019-08-12: 03:00:00 25 ug via INTRAVENOUS
  Filled 2019-08-12: qty 2

## 2019-08-12 MED ORDER — SODIUM CHLORIDE 0.9 % IV BOLUS
1000.0000 mL | Freq: Once | INTRAVENOUS | Status: AC
  Administered 2019-08-12: 01:00:00 1000 mL via INTRAVENOUS

## 2019-08-12 MED ORDER — ONDANSETRON HCL 4 MG/2ML IJ SOLN: 4.0000 mg | Freq: Four times a day (QID) | INTRAMUSCULAR | Status: DC | PRN

## 2019-08-12 MED ORDER — SODIUM CHLORIDE 0.9 % IV SOLN
INTRAVENOUS | Status: DC
  Administered 2019-08-12: 03:00:00 via INTRAVENOUS

## 2019-08-12 MED ORDER — FENTANYL CITRATE (PF) 100 MCG/2ML IJ SOLN
50.0000 ug | Freq: Once | INTRAMUSCULAR | Status: AC
  Administered 2019-08-12: 50 ug via INTRAVENOUS

## 2019-08-12 MED ORDER — ONDANSETRON HCL 4 MG PO TABS: 4.0000 mg | ORAL_TABLET | Freq: Four times a day (QID) | ORAL | Status: DC | PRN

## 2019-08-12 MED ORDER — CEFPODOXIME PROXETIL 200 MG PO TABS: 200.0000 mg | ORAL_TABLET | Freq: Two times a day (BID) | ORAL | 0 refills | Status: AC

## 2019-08-12 MED ORDER — SODIUM CHLORIDE 0.9 % IV SOLN: 1.0000 g | INTRAVENOUS | Status: DC

## 2019-08-12 NOTE — H&P (Signed)
History and Physical   [name] [id number] DOB: 02-13-1941 DOA: 08/11/2019  Referring MD/NP/PA: Dr. Sherry Ruffing  PCP: Josetta Huddle, MD   Outpatient Specialists: None  Patient coming from: Home via [place]  Chief Complaint: Right flank pain  HPI: Sean Hammond is a 78 y.o. male with medical history significant of coronary artery disease, chronic headaches, leukopenia, prostatitis, allergic rhinitis who presented to [facility] with right flank pain that was persistent and rated at 10 out of 10.  Pain started suddenly yesterday.  Associated with some nausea but no vomiting.  It radiates towards his right lower quadrant.  This is new to the patient has not had this before.  He denied any fever or chills no diarrhea.  No sick contact.  Patient fully evaluated found to have evidence of acute pyelonephritis and possibly be just passed stool.  Patient is therefore being admitted for treatment of acute pyelonephritis, nephrolithiasis most likely an early sepsis..  ED Course: Temperature 98 blood pressure 164/88 pulse 91 respiratory of 20 oxygen sat 100% room air.  Sodium 137 glucose 144 BUN 24 creatinine 1.57.  White count is 10.9 otherwise rest of the CBC is within normal COVID-19 screen is negative.  CT renal stone study showed mild right hydronephrosis with mild right perinephric stranding.  No stone seen.  Finding may represent recently passed right renal calculus versus pyelonephritis.  Patient is being admitted for treatment.  Urinalysis did not support active infection.  Review of Systems: As per HPI otherwise 10 point review of systems negative.    Past Medical History:  Diagnosis Date   Allergic rhinitis    CAD (coronary artery disease)    a. s/p DES to LCx x 2 in 2013 // b. Myoview 7/15: inferior ischemia, EF 52%, low risk  //  c. LHC 6/17: LCx stents patent, no sig CAD elsewhere, EF 65% >> Med Rx   Chronic headaches    "started  09/2011"   ED (erectile  dysfunction)    Epicondylitis    Epistaxis    Family history of adverse reaction to anesthesia    wife has nausea with anesthesia    GERD (gastroesophageal reflux disease)    History of echocardiogram    a. Echo 5/16: mild LVH, EF 55-60%, no RWMA, PASP 33 mmHg   Hyperlipidemia    Insomnia    , post   Leukopenia    Lung nodule    , Left upper   Moderate obstructive sleep apnea    not used cpap since 2014 bought bed that head would elevate    Osteoarthritis of hand    PONV (postoperative nausea and vomiting)    Prostatitis    Silent myocardial infarction ([place])    Skin cancer    head, ear, back    Past Surgical History:  Procedure Laterality Date   BONE CYST EXCISION  ~ 2004   left knee   CARDIAC CATHETERIZATION N/A 05/09/2016   Procedure: Left Heart Cath and Coronary Angiography;  Surgeon: Jerline Pain, MD;  Location: [facility];  Service: Cardiovascular;  Laterality: N/A;   CHOLECYSTECTOMY N/A 03/23/2015   Procedure: LAPAROSCOPIC CHOLECYSTECTOMY;  Surgeon: Ralene Ok, MD;  Location: [facility];  Service: General;  Laterality: N/A;   CORONARY ANGIOPLASTY WITH STENT PLACEMENT  12/14/11   "2"   EYE SURGERY  ~ 2010   "both eyes; reshape pupil"   KNEE ARTHROSCOPY  ` 2010   left   LEFT  HEART CATHETERIZATION WITH CORONARY ANGIOGRAM N/A 12/14/2011   Procedure: LEFT HEART CATHETERIZATION WITH CORONARY ANGIOGRAM;  Surgeon: Candee Furbish, MD;  Location: [facility] C[facility];  Service: Cardiovascular;  Laterality: N/A;   NASAL SINUS SURGERY  ~ 2002 & 2003   PERCUTANEOUS CORONARY STENT INTERVENTION (PCI-S)  12/14/2011   Procedure: PERCUTANEOUS CORONARY STENT INTERVENTION (PCI-S);  Surgeon: Candee Furbish, MD;  Location: [facility] CATH LAB;  Service: Cardiovascular;;   SKIN CANCER EXCISION     "back; head; ears"   TOTAL KNEE ARTHROPLASTY Left 05/09/2017   Procedure: LEFT TOTAL KNEE ARTHROPLASTY;  Surgeon: Latanya Maudlin, MD;  Location: [facility];  Service: Orthopedics;   Laterality: Left;     reports that he has quit smoking. His smoking use included cigarettes. He has a 24.00 pack-year smoking history. He has never used smokeless tobacco. He reports previous alcohol use. He reports that he does not use drugs.  Allergies  Allergen Reactions   Beef-Derived Products Anaphylaxis, Hives, Shortness Of Breath and Rash    Because of a tick bite, the patient now has "Alpha-gal allergy"   Pork-Derived Products Anaphylaxis, Hives, Shortness Of Breath and Rash    Because of a tick bite, the patient now has "Alpha-gal allergy"   Codeine Other (See Comments)    Surgeon advised against this   Crestor [Rosuvastatin] Other (See Comments)    Muscle aches   Dilaudid [Hydromorphone Hcl] Hives    All-over hives   Hydrocodone Itching   Lipitor [Atorvastatin] Other (See Comments)    Muscle aches    Percocet [Oxycodone-Acetaminophen] Itching   Pitavastatin Other (See Comments)    Leg pain    Family History  Problem Relation Age of Onset   Heart attack Mother    Hypertension Mother      Prior to Admission medications   Medication Sig Start Date End Date Taking? Authorizing Provider  aspirin 81 MG EC tablet Take 1 tablet (81 mg total) by mouth daily. 12/27/17   Jerline Pain, MD  Cholecalciferol (VITAMIN D HIGH POTENCY) 1000 UNITS capsule Take 1,000 Units by mouth daily.    [provider]  diphenhydrAMINE (BENADRYL) 25 MG tablet Take 25-50 mg by mouth every 6 (six) hours as needed (for allergic reactions).     [provider]  EPIPEN 2-PAK 0.3 MG/0.3ML SOAJ injection Inject 0.3 mg into the muscle once as needed (for an anaphylactic reaction).  03/17/14   [provider]  lidocaine (LIDODERM) 5 % Place 1 patch onto the skin daily as needed (for knee pain). Remove & Discard patch within 12 hours or as directed by MD     [provider]  metoprolol succinate (TOPROL-XL) 25 MG 24 hr tablet TAKE 1 TABLET BY MOUTH EVERY DAY  02/11/19   Jerline Pain, MD  nitroGLYCERIN (NITROSTAT) 0.4 MG SL tablet PLACE 1 TABLET UNDER TONGUE EVERY 5 MINUTES AS NEEDED FOR CHEST PAIN 07/23/18   Jerline Pain, MD  pantoprazole (PROTONIX) 40 MG tablet TAKE 1 TABLET BY MOUTH DAILY AT 12 NOON. 02/11/19   Jerline Pain, MD  PREVIDENT 5000 SENSITIVE [id number] % PSTE 1 application 2 (two) times daily.  11/24/15   [provider]  tamsulosin (FLOMAX) 0.4 MG CAPS capsule Take 1 capsule (0.4 mg total) by mouth daily. 05/09/17   Constable, Amber, PA-C  traMADol (ULTRAM) 50 MG tablet Take 50 mg by mouth every 4 (four) hours as needed (for pain).  04/25/17   [provider]    Physical Exam: Vitals:  08/11/19 2120 08/11/19 2200 08/12/19 0133 08/12/19 0202  BP: (!) 162/94 (!) 165/88 (!) 157/81 133/90  Pulse: 73 67 81 91  Resp: 18 20 20 18   Temp: 97.9 F (36.6 C)   98 F (36.7 C)  TempSrc: Oral   Oral  SpO2: 97% 96% 100% 99%  Weight: 93 kg     Height: 6\' 1"  (1.854 m)         Constitutional: Very uncomfortable, mild distress Vitals:   08/11/19 2120 08/11/19 2200 08/12/19 0133 08/12/19 0202  BP: (!) 162/94 (!) 165/88 (!) 157/81 133/90  Pulse: 73 67 81 91  Resp: 18 20 20 18   Temp: 97.9 F (36.6 C)   98 F (36.7 C)  TempSrc: Oral   Oral  SpO2: 97% 96% 100% 99%  Weight: 93 kg     Height: 6\' 1"  (1.854 m)      Eyes: PERRL, lids and conjunctivae normal ENMT: Mucous membranes are moist. Posterior pharynx clear of any exudate or lesions.Normal dentition.  Neck: normal, supple, no masses, no thyromegaly Respiratory: clear to auscultation bilaterally, no wheezing, no crackles. Normal respiratory effort. No accessory muscle use.  Cardiovascular: Regular rate and rhythm, no murmurs / rubs / gallops. No extremity edema. 2+ pedal pulses. No carotid bruits.  Abdomen: Mild CVA and right flank tenderness, no masses palpated. No hepatosplenomegaly. Bowel sounds positive.  Musculoskeletal: no clubbing / cyanosis. No joint deformity upper  and lower extremities. Good ROM, no contractures. Normal muscle tone.  Skin: no rashes, lesions, ulcers. No induration Neurologic: CN 2-12 grossly intact. Sensation intact, DTR normal. Strength 5/5 in all 4.  Psychiatric: Normal judgment and insight. Alert and oriented x 3. Normal mood.     Labs on Admission: I have personally reviewed following labs and imaging studies  CBC: Recent Labs  Lab 08/11/19 2149  WBC 9.3  NEUTROABS 7.3  HGB 14.6  HCT 44.0  MCV 93.4  PLT [id number]   Basic Metabolic Panel: Recent Labs  Lab 08/11/19 2149  NA 137  K 4.6  CL 103  CO2 25  GLUCOSE 195*  BUN 26*  CREATININE 2.11*  CALCIUM 8.8*   GFR: Estimated Creatinine Clearance: 33.7 mL/min (A) (by C-G formula based on SCr of 2.11 mg/dL (H)). Liver Function Tests: Recent Labs  Lab 08/11/19 2149  AST 25  ALT 19  ALKPHOS 55  BILITOT 0.5  PROT 6.7  ALBUMIN 3.9   No results for input(s): LIPASE, AMYLASE in the last 168 hours. No results for input(s): AMMONIA in the last 168 hours. Coagulation Profile: No results for input(s): INR, PROTIME in the last 168 hours. Cardiac Enzymes: No results for input(s): CKTOTAL, CKMB, CKMBINDEX, TROPONINI in the last 168 hours. BNP (last 3 results) No results for input(s): PROBNP in the last 8760 hours. HbA1C: No results for input(s): HGBA1C in the last 72 hours. CBG: No results for input(s): GLUCAP in the last 168 hours. Lipid Profile: No results for input(s): CHOL, HDL, LDLCALC, TRIG, CHOLHDL, LDLDIRECT in the last 72 hours. Thyroid Function Tests: No results for input(s): TSH, T4TOTAL, FREET4, T3FREE, THYROIDAB in the last 72 hours. Anemia Panel: No results for input(s): VITAMINB12, FOLATE, FERRITIN, TIBC, IRON, RETICCTPCT in the last 72 hours. Urine analysis:    Component Value Date/Time   COLORURINE YELLOW 08/11/2019 2149   APPEARANCEUR CLEAR 08/11/2019 2149   LABSPEC >1.030 (H) 08/11/2019 2149   PHURINE 5.5 08/11/2019 2149   GLUCOSEU NEGATIVE  08/11/2019 2149   HGBUR LARGE (A) 08/11/2019 2149  [place] NEGATIVE 08/11/2019 2149   KETONESUR 15 (A) 08/11/2019 2149   PROTEINUR NEGATIVE 08/11/2019 2149   UROBILINOGEN 0.2 03/21/2015 1213   NITRITE NEGATIVE 08/11/2019 2149   LEUKOCYTESUR NEGATIVE 08/11/2019 2149   Sepsis Labs: @LABRCNTIP (procalcitonin:4,lacticidven:4) )No results found for this or any previous visit (from the past 240 hour(s)).   Radiological Exams on Admission: Ct Renal Stone Study  Result Date: 08/11/2019 CLINICAL DATA:  78 year old male with right flank pain x2 days. EXAM: CT ABDOMEN AND PELVIS WITHOUT CONTRAST TECHNIQUE: Multidetector CT imaging of the abdomen and pelvis was performed following the standard protocol without IV contrast. COMPARISON:  CT of the abdomen pelvis dated 11/21/2018 FINDINGS: Lower chest: The visualized lung bases are clear. No intra-abdominal free air or free fluid. Hepatobiliary: The liver is unremarkable. No intrahepatic biliary ductal dilatation. Cholecystectomy. Pancreas: There is fatty infiltration the pancreas. No active inflammatory changes or dilatation of main pancreatic duct. Spleen: Small calcified splenic granuloma. Adrenals/Urinary Tract: The adrenal glands are unremarkable. There is a mild right hydronephrosis. Mild right perinephric stranding noted. Faint high attenuating content within interpolar collecting system (series 2, image 27 and coronal series 4, image 65) may represent tiny stone or proteinaceous debris versus blood product. Clinical correlation is recommended. No obstructing stone identified. There is no hydronephrosis or nephrolithiasis on the left. A 12 mm exophytic low attenuating lesion from the inferior pole of the left kidney demonstrates fluid attenuation most likely a cyst. The urinary bladder is partially distended. There is mild thickened and trabecular appearance of bladder wall which may be related to chronic bladder outlet obstruction and underdistention.  Stomach/Bowel: There is extensive sigmoid diverticulosis without active inflammatory changes. There is no bowel obstruction or active inflammation. The appendix is normal. Vascular/Lymphatic: Moderate aortoiliac atherosclerotic disease. The IVC is unremarkable. No portal venous gas. There is no adenopathy. Reproductive: The prostate gland is enlarged measuring 5.5 cm in transverse axial diameter. The seminal vesicles are symmetric. Other: None Musculoskeletal: Degenerative changes of the spine. No acute osseous pathology. IMPRESSION: 1. Mild right hydronephrosis with mild right perinephric stranding. No obstructing stone identified. Findings may represent a recently passed right renal calculus versus pyelonephritis. Correlation with urinalysis recommended. 2. Extensive sigmoid diverticulosis. No bowel obstruction or active inflammation. Normal appendix. Aortic Atherosclerosis (ICD10-I70.0). Electronically Signed   By: Anner Crete M.D.   On: 08/11/2019 22:52      Assessment/Plan Principal Problem:   AKI (acute kidney injury) ([place]) Active Problems:   Coronary atherosclerosis of native coronary artery   Hyperlipidemia   Acute pyelonephritis   Hydronephrosis     #1 acute kidney injury: Suspected postoperative secondary to recently passed kidney stones but also possibly due to dehydration.  Admit the patient hydrate the patient and follow renal function overnight.  Creatinine has doubled from his baseline.  #2 right flank pain: Most likely due to nephrolithiasis that he just passed.  No evidence of UTI in his urine so pyelonephritis less likely.  Continue empiric antibiotics and pain management  #3 mild hydronephrosis on the right: Again suspected stone which may have passed.  I will empirically add Flomax.  Patient may need to be referred to urology for follow-up.  #4 hyperlipidemia: Continue home regimen  #5 coronary artery disease: Appears stable at baseline.   DVT prophylaxis:  Heparin Code Status: Full code Family Communication: No family at bedside Disposition Plan: Home Consults called: None Admission status: Inpatient  Severity of Illness: The appropriate patient status for this patient is INPATIENT. Inpatient status is judged to  be reasonable and necessary in order to provide the required intensity of service to ensure the patient's safety. The patient's presenting symptoms, physical exam findings, and initial radiographic and laboratory data in the context of their chronic comorbidities is felt to place them at high risk for further clinical deterioration. Furthermore, it is not anticipated that the patient will be medically stable for discharge from the hospital within 2 midnights of admission. The following factors support the patient status of inpatient.   " The patient's presenting symptoms include right flank pain. " The worrisome physical exam findings include tenderness in the right flank. " The initial radiographic and laboratory data are worrisome because of CT evidence of recently passed stone. " The chronic co-morbidities include coronary artery disease.   * I certify that at the point of admission it is my clinical judgment that the patient will require inpatient hospital care spanning beyond 2 midnights from the point of admission due to high intensity of service, high risk for further deterioration and high frequency of surveillance required.Barbette Merino MD [facility] Pager [phone]  If 7PM-7AM, please contact night-coverage [email or web address] Password [email or web address]  08/12/2019, 2:20 AM

## 2019-08-12 NOTE — Progress Notes (Signed)
Discharge and medication instructions reviewed with patient and spouse. Questions answered and patient denies further questions. No prescriptions given. Spouse is driving patient home. Donne Hazel, RN

## 2019-08-12 NOTE — Discharge Summary (Signed)
Physician Discharge Summary  Sean Hammond [id number] DOB: 1941/04/27 DOA: 08/11/2019  PCP: Josetta Huddle, MD  Admit date: 08/11/2019 Discharge date: 08/12/2019  Admitted From: Home Disposition: Home  Recommendations for Outpatient Follow-up:  1. Follow up with PCP in 1 week 2. Please obtain BMP/CBC in one week 3. Please follow up on the following pending results: None  Home Health: None Equipment/Devices: None  Discharge Condition: Stable CODE STATUS: Full code Diet recommendation: Heart healthy   Brief/Interim Summary:  Admission HPI written by Elwyn Reach, MD   Chief Complaint: Right flank pain  HPI: Sean Hammond is a 78 y.o. male with medical history significant of coronary artery disease, chronic headaches, leukopenia, prostatitis, allergic rhinitis who presented to [facility] with right flank pain that was persistent and rated at 10 out of 10.  Pain started suddenly yesterday.  Associated with some nausea but no vomiting.  It radiates towards his right lower quadrant.  This is new to the patient has not had this before.  He denied any fever or chills no diarrhea.  No sick contact.  Patient fully evaluated found to have evidence of acute pyelonephritis and possibly be just passed stool.  Patient is therefore being admitted for treatment of acute pyelonephritis, nephrolithiasis most likely an early sepsis..  ED Course: Temperature 98 blood pressure 164/88 pulse 91 respiratory of 20 oxygen sat 100% room air.  Sodium 137 glucose 144 BUN 24 creatinine 1.57.  White count is 10.9 otherwise rest of the CBC is within normal COVID-19 screen is negative.  CT renal stone study showed mild right hydronephrosis with mild right perinephric stranding.  No stone seen.  Finding may represent recently passed right renal calculus versus pyelonephritis.  Patient is being admitted for treatment.  Urinalysis did not support active infection.    Hospital course:  Acute  pyelonephritis In setting of kidney stone. Symptoms improved as mentioned below. Urine culture obtained and patient started on empiric antibiotics, Ceftriaxone IV. This was transitioned to Vantin PO on discharge. Post-discharge, urine culture resulted as no growth. Discharged with a 10 day total course.  Acute kidney injury Baseline creatinine of 1.1-1.2. Creatinine of 2.11 no admission. Secondary to mild hydronephrosis likely from nephrolithiasis which appears to have resolved. Creatinine improved with IV fluids overnight with discharge creatinine of 1.57. Outpatient BMP. Continue good oral fluid intake.  Right nephrolithiasis Associated right flank pain requiring IV narcotics in the ED. Stone passed and symptoms significantly improved prior to discharge.  Hydronephrosis Mild. Secondary to nephrolithiasis. Outpatient follow-up with renal ultrasound to ensure resolution with repeat BMP to ensure no worsening.    Discharge Diagnoses:  Principal Problem:   AKI (acute kidney injury) ([place]) Active Problems:   Coronary atherosclerosis of native coronary artery   Hyperlipidemia   Acute pyelonephritis   Hydronephrosis    Discharge Instructions  Discharge Instructions    Call MD for:  persistant nausea and vomiting   Complete by: As directed    Call MD for:  severe uncontrolled pain   Complete by: As directed    Call MD for:  temperature >100.4   Complete by: As directed    Increase activity slowly   Complete by: As directed      Allergies as of 08/12/2019      Reactions   Beef-derived Products Anaphylaxis, Hives, Shortness Of Breath, Rash   Because of a tick bite, the patient now has "Alpha-gal allergy"   Pork-derived Products Anaphylaxis, Hives, Shortness Of  Breath, Rash   Because of a tick bite, the patient now has "Alpha-gal allergy"   Codeine Other (See Comments)   Surgeon advised against this   Crestor [rosuvastatin] Other (See Comments)   Muscle aches   Dilaudid  [hydromorphone Hcl] Hives   All-over hives   Hydrocodone Itching   Lipitor [atorvastatin] Other (See Comments)   Muscle aches   Percocet [oxycodone-acetaminophen] Itching   Pitavastatin Other (See Comments)   Leg pain      Medication List    TAKE these medications   aspirin 81 MG EC tablet Take 1 tablet (81 mg total) by mouth daily.   cefpodoxime 200 MG tablet Commonly known as: VANTIN Take 1 tablet (200 mg total) by mouth 2 (two) times daily for 9 days. Start taking on: August 13, 2019   diclofenac sodium 1 % Gel Commonly known as: VOLTAREN Apply 1 application topically 4 (four) times daily as needed (pain).   diphenhydrAMINE 25 MG tablet Commonly known as: BENADRYL Take 25-50 mg by mouth every 6 (six) hours as needed (for allergic reactions).   EpiPen 2-Pak 0.3 mg/0.3 mL Soaj injection Generic drug: EPINEPHrine Inject 0.3 mg into the muscle once as needed (for an anaphylactic reaction).   lidocaine 5 % Commonly known as: LIDODERM Place 1 patch onto the skin daily as needed (for knee pain). Remove & Discard patch within 12 hours or as directed by MD   metoprolol succinate 25 MG 24 hr tablet Commonly known as: TOPROL-XL TAKE 1 TABLET BY MOUTH EVERY DAY What changed: when to take this   nitroGLYCERIN 0.4 MG SL tablet Commonly known as: NITROSTAT PLACE 1 TABLET UNDER TONGUE EVERY 5 MINUTES AS NEEDED FOR CHEST PAIN What changed: See the new instructions.   ondansetron 4 MG tablet Commonly known as: ZOFRAN Take 1 tablet (4 mg total) by mouth every 6 (six) hours as needed for nausea.   pantoprazole 40 MG tablet Commonly known as: PROTONIX TAKE 1 TABLET BY MOUTH DAILY AT 12 NOON. What changed: See the new instructions.   PreviDent 5000 Sensitive 1.1-5 % Pste Generic drug: Sod Fluoride-Potassium Nitrate 1 application 2 (two) times daily.   tamsulosin 0.4 MG Caps capsule Commonly known as: FLOMAX Take 1 capsule (0.4 mg total) by mouth daily.   traMADol 50 MG  tablet Commonly known as: ULTRAM Take 1-2 tablets (50-100 mg total) by mouth every 6 (six) hours as needed for up to 3 days for moderate pain or severe pain. What changed:   how much to take  when to take this  reasons to take this   Vitamin D High Potency 25 MCG (1000 UT) capsule Generic drug: Cholecalciferol Take 1,000 Units by mouth daily.      Follow-up Information    Josetta Huddle, MD. Schedule an appointment as soon as possible for a visit in 1 week(s).   Specialty: Internal Medicine Contact information: [address] [place] [address] [phone]          Allergies  Allergen Reactions   Beef-Derived Products Anaphylaxis, Hives, Shortness Of Breath and Rash    Because of a tick bite, the patient now has "Alpha-gal allergy"   Pork-Derived Products Anaphylaxis, Hives, Shortness Of Breath and Rash    Because of a tick bite, the patient now has "Alpha-gal allergy"   Codeine Other (See Comments)    Surgeon advised against this   Crestor [Rosuvastatin] Other (See Comments)    Muscle aches   Dilaudid [Hydromorphone Hcl] Hives  All-over hives   Hydrocodone Itching   Lipitor [Atorvastatin] Other (See Comments)    Muscle aches    Percocet [Oxycodone-Acetaminophen] Itching   Pitavastatin Other (See Comments)    Leg pain    Consultations:  None   Procedures/Studies: Ct Renal Stone Study  Result Date: 08/11/2019 CLINICAL DATA:  78 year old male with right flank pain x2 days. EXAM: CT ABDOMEN AND PELVIS WITHOUT CONTRAST TECHNIQUE: Multidetector CT imaging of the abdomen and pelvis was performed following the standard protocol without IV contrast. COMPARISON:  CT of the abdomen pelvis dated 11/21/2018 FINDINGS: Lower chest: The visualized lung bases are clear. No intra-abdominal free air or free fluid. Hepatobiliary: The liver is unremarkable. No intrahepatic biliary ductal dilatation. Cholecystectomy. Pancreas: There is fatty  infiltration the pancreas. No active inflammatory changes or dilatation of main pancreatic duct. Spleen: Small calcified splenic granuloma. Adrenals/Urinary Tract: The adrenal glands are unremarkable. There is a mild right hydronephrosis. Mild right perinephric stranding noted. Faint high attenuating content within interpolar collecting system (series 2, image 27 and coronal series 4, image 65) may represent tiny stone or proteinaceous debris versus blood product. Clinical correlation is recommended. No obstructing stone identified. There is no hydronephrosis or nephrolithiasis on the left. A 12 mm exophytic low attenuating lesion from the inferior pole of the left kidney demonstrates fluid attenuation most likely a cyst. The urinary bladder is partially distended. There is mild thickened and trabecular appearance of bladder wall which may be related to chronic bladder outlet obstruction and underdistention. Stomach/Bowel: There is extensive sigmoid diverticulosis without active inflammatory changes. There is no bowel obstruction or active inflammation. The appendix is normal. Vascular/Lymphatic: Moderate aortoiliac atherosclerotic disease. The IVC is unremarkable. No portal venous gas. There is no adenopathy. Reproductive: The prostate gland is enlarged measuring 5.5 cm in transverse axial diameter. The seminal vesicles are symmetric. Other: None Musculoskeletal: Degenerative changes of the spine. No acute osseous pathology. IMPRESSION: 1. Mild right hydronephrosis with mild right perinephric stranding. No obstructing stone identified. Findings may represent a recently passed right renal calculus versus pyelonephritis. Correlation with urinalysis recommended. 2. Extensive sigmoid diverticulosis. No bowel obstruction or active inflammation. Normal appendix. Aortic Atherosclerosis (ICD10-I70.0). Electronically Signed   By: Anner Crete M.D.   On: 08/11/2019 22:52      Subjective: Flank pain significantly  improved. No other issues  Discharge Exam: Vitals:   08/12/19 0202 08/12/19 0607  BP: 133/90 138/73  Pulse: 91 80  Resp: 18 18  Temp: 98 F (36.7 C) 98 F (36.7 C)  SpO2: 99% 96%   Vitals:   08/11/19 2200 08/12/19 0133 08/12/19 0202 08/12/19 0607  BP: (!) 165/88 (!) 157/81 133/90 138/73  Pulse: 67 81 91 80  Resp: 20 20 18 18   Temp:   98 F (36.7 C) 98 F (36.7 C)  TempSrc:   Oral Oral  SpO2: 96% 100% 99% 96%  Weight:      Height:        General: Pt is alert, awake, not in acute distress Cardiovascular: RRR, S1/S2 +, no rubs, no gallops Respiratory: CTA bilaterally, no wheezing, no rhonchi Abdominal: Soft, NT, ND, bowel sounds + Extremities: no edema, no cyanosis    The results of significant diagnostics from this hospitalization (including imaging, microbiology, ancillary and laboratory) are listed below for reference.     Microbiology: Recent Results (from the past 240 hour(s))  Urine culture     Status: None   Collection Time: 08/11/19  9:51 PM   Specimen: Urine, Random  Result Value Ref Range Status   Specimen Description   Final    URINE, RANDOM Performed at [facility], [address] [place], [place] [address]    Special Requests   Final    NONE Performed at [facility], [address] [place], [place] [address]    Culture   Final    NO GROWTH Performed at [facility], [place] [address], [place], [place] [address]    Report Status 08/13/2019 FINAL  Final  SARS CORONAVIRUS 2 (TAT 6-24 HRS) Nasopharyngeal Nasopharyngeal Swab     Status: None   Collection Time: 08/12/19 12:12 AM   Specimen: Nasopharyngeal Swab  Result Value Ref Range Status   SARS Coronavirus 2 NEGATIVE NEGATIVE Final    Comment: (NOTE) SARS-CoV-2 target nucleic acids are NOT DETECTED. The SARS-CoV-2 RNA is generally detectable in upper and lower respiratory specimens during the acute phase of infection. Negative results do not preclude SARS-CoV-2  infection, do not rule out co-infections with other pathogens, and should not be used as the sole basis for treatment or other patient management decisions. Negative results must be combined with clinical observations, patient history, and epidemiological information. The expected result is Negative. Fact Sheet for Patients: SugarRoll.be Fact Sheet for Healthcare Providers: https://www.woods-mathews.com/ This test is not yet approved or cleared by the [place] FDA and  has been authorized for detection and/or diagnosis of SARS-CoV-2 by FDA under an Emergency Use Authorization (EUA). This EUA will remain  in effect (meaning this test can be used) for the duration of the COVID-19 declaration under Section 56 4(b)(1) of the Act, 21 U.S.C. section 360bbb-3(b)(1), unless the authorization is terminated or revoked sooner. Performed at [facility], [place] [address], [place], [place] [address]   Blood culture (routine x 2)     Status: None   Collection Time: 08/12/19 12:50 AM   Specimen: BLOOD  Result Value Ref Range Status   Specimen Description   Final    BLOOD LEFT ANTECUBITAL Performed at [facility], [address] [place], [place] [address]    Special Requests   Final    BOTTLES DRAWN AEROBIC AND ANAEROBIC Blood Culture adequate volume Performed at [facility], [address] [place], [place] [address]    Culture   Final    NO GROWTH 5 DAYS Performed at [facility], [place] [address], [place], [place] [address]    Report Status 08/17/2019 FINAL  Final  Blood culture (routine x 2)     Status: None   Collection Time: 08/12/19 12:55 AM   Specimen: BLOOD  Result Value Ref Range Status   Specimen Description   Final    BLOOD RIGHT ANTECUBITAL Performed at [facility], [address] [place], [place] [address]    Special Requests   Final    BOTTLES DRAWN AEROBIC AND ANAEROBIC Blood  Culture adequate volume Performed at [facility], [address] [place], [place] [address]    Culture   Final    NO GROWTH 5 DAYS Performed at [facility], [place] [address] [place],  [address]    Report Status 08/17/2019 FINAL  Final     Labs: BNP (last 3 results) No results for input(s): BNP in the last 8760 hours. Basic Metabolic Panel: Recent Labs  Lab 08/11/19 2149 08/12/19 0322  NA 137 137  K 4.6 4.2  CL 103 109  CO2 25 23  GLUCOSE 195* 144*  BUN 26* 24*  CREATININE 2.11* 1.57*  CALCIUM 8.8* 8.3*   Liver Function Tests: Recent Labs  Lab 08/11/19 2149 08/12/19 0322  AST 25 23  ALT 19 17  ALKPHOS 55 48  BILITOT 0.5 0.2*  PROT 6.7 5.7*  ALBUMIN 3.9 3.5   CBC: Recent Labs  Lab 08/11/19 2149 08/12/19 0322  WBC 9.3 10.9*  NEUTROABS 7.3  --   HGB 14.6 13.6  HCT 44.0 40.2  MCV 93.4 93.9  PLT 173 142*   Urinalysis    Component Value Date/Time   COLORURINE YELLOW 08/11/2019 2149   APPEARANCEUR CLEAR 08/11/2019 2149   LABSPEC >1.030 (H) 08/11/2019 2149   PHURINE 5.5 08/11/2019 2149   GLUCOSEU NEGATIVE 08/11/2019 2149   HGBUR LARGE (A) 08/11/2019 2149   BILIRUBINUR NEGATIVE 08/11/2019 2149   KETONESUR 15 (A) 08/11/2019 2149   PROTEINUR NEGATIVE 08/11/2019 2149   UROBILINOGEN 0.2 03/21/2015 1213   NITRITE NEGATIVE 08/11/2019 2149   LEUKOCYTESUR NEGATIVE 08/11/2019 2149    SIGNED:   Cordelia Poche, MD [facility] 08/12/2019, 10:57 AM

## 2019-08-12 NOTE — Telephone Encounter (Signed)
Will contact the patient tomorrow for clearance. Patient currently admitted for acute pyelonephritis and possible nephrolithiasis.

## 2019-08-12 NOTE — Discharge Instructions (Signed)
Sean Hammond,  You were in the hospital because of pain and evidence of a kidney infection in the setting of kidney stone. It appears your stone passed. You will be discharged on antibiotics but please follow-up with your PCP. You have labwork that is pending on discharge that you will need to follow-up on with your PCP

## 2019-08-12 NOTE — Telephone Encounter (Signed)
Given the inherent risk of increased bleeding during ethmoidectomy, I am comfortable with him holding his aspirin 5 days prior to the surgery.  His risk of myocardial infarction/thrombosis is quite low. Candee Furbish, MD

## 2019-08-13 LAB — URINE CULTURE: Culture: NO GROWTH

## 2019-08-13 NOTE — Telephone Encounter (Signed)
   Primary Cardiologist: Candee Furbish, MD  Chart reviewed as part of pre-operative protocol coverage. Patient was contacted 08/13/2019 in reference to pre-operative risk assessment for pending surgery as outlined below.  [name] was last seen on 12/17/2018 by Dr. [name].  Since that day, PRAJWAL ZURICK has done well without chest pain or shortness of breath.  Therefore, based on ACC/AHA guidelines, the patient would be at acceptable risk for the planned procedure without further cardiovascular testing.   I will route this recommendation to the requesting party via Epic fax function and remove from pre-op pool.  Please call with questions. He has been instructed to hold aspirin for 5 days prior to the surgery and restart as soon as possible afterward at the surgeon's discretion.   [place], [place] 08/13/2019, 10:23 AM

## 2019-08-17 LAB — CULTURE, BLOOD (ROUTINE X 2)
Culture: NO GROWTH
Culture: NO GROWTH
Special Requests: ADEQUATE
Special Requests: ADEQUATE

## 2019-08-25 ENCOUNTER — Other Ambulatory Visit: Payer: Self-pay

## 2019-08-25 DIAGNOSIS — N130 Hydronephrosis with ureteropelvic junction obstruction: Secondary | ICD-10-CM | POA: Diagnosis not present

## 2019-08-25 DIAGNOSIS — R310 Gross hematuria: Secondary | ICD-10-CM | POA: Diagnosis not present

## 2019-08-25 DIAGNOSIS — N281 Cyst of kidney, acquired: Secondary | ICD-10-CM | POA: Diagnosis not present

## 2019-08-26 ENCOUNTER — Other Ambulatory Visit: Payer: Self-pay | Admitting: Urology

## 2019-08-28 ENCOUNTER — Other Ambulatory Visit (HOSPITAL_COMMUNITY)
Admission: RE | Admit: 2019-08-28 | Discharge: 2019-08-28 | Disposition: A | Discharge status: Home / Self Care | Payer: Medicare Other | Source: Ambulatory Visit | Attending: Otolaryngology | Admitting: Otolaryngology

## 2019-08-28 DIAGNOSIS — Z01812 Encounter for preprocedural laboratory examination: Secondary | ICD-10-CM | POA: Diagnosis not present

## 2019-08-28 DIAGNOSIS — Z20828 Contact with and (suspected) exposure to other viral communicable diseases: Secondary | ICD-10-CM | POA: Diagnosis not present

## 2019-08-29 LAB — NOVEL CORONAVIRUS, NAA (HOSP ORDER, SEND-OUT TO REF LAB; TAT 18-24 HRS): SARS-CoV-2, NAA: NOT DETECTED

## 2019-08-29 NOTE — Progress Notes (Signed)
Reviewed chart with anesthesia, Konrad Felix PA, pt having ENT surgery on 09-01-2019 with general anesthesia and cystoscopy w/ ureteroscopy on 09-05-2019 with general anesthesia.  Per anesthesia pt will need clearance from ENT doctor, Dr. Constance Holster, stating that pt cleared for surgery on the 09-05-2019 concerning airway.  Called and spoke w/ Foster Simpson, or scheduler for dr [name], informed her of clearance needed for surgery on 09-05-2019.

## 2019-09-01 ENCOUNTER — Ambulatory Visit (HOSPITAL_BASED_OUTPATIENT_CLINIC_OR_DEPARTMENT_OTHER)
Admission: RE | Admit: 2019-09-01 | Discharge: 2019-09-01 | Disposition: A | Discharge status: Home / Self Care | Payer: Medicare Other | Attending: Otolaryngology | Admitting: Otolaryngology

## 2019-09-01 ENCOUNTER — Encounter (HOSPITAL_BASED_OUTPATIENT_CLINIC_OR_DEPARTMENT_OTHER)
Admission: RE | Disposition: A | Discharge status: Home / Self Care | Payer: Self-pay | Source: Home / Self Care | Attending: Otolaryngology

## 2019-09-01 ENCOUNTER — Ambulatory Visit (HOSPITAL_BASED_OUTPATIENT_CLINIC_OR_DEPARTMENT_OTHER): Payer: Medicare Other | Admitting: Certified Registered"

## 2019-09-01 ENCOUNTER — Encounter (HOSPITAL_BASED_OUTPATIENT_CLINIC_OR_DEPARTMENT_OTHER): Payer: Self-pay

## 2019-09-01 ENCOUNTER — Other Ambulatory Visit: Payer: Self-pay

## 2019-09-01 DIAGNOSIS — Z7982 Long term (current) use of aspirin: Secondary | ICD-10-CM | POA: Insufficient documentation

## 2019-09-01 DIAGNOSIS — Z87891 Personal history of nicotine dependence: Secondary | ICD-10-CM | POA: Diagnosis not present

## 2019-09-01 DIAGNOSIS — Z79899 Other long term (current) drug therapy: Secondary | ICD-10-CM | POA: Diagnosis not present

## 2019-09-01 DIAGNOSIS — Z955 Presence of coronary angioplasty implant and graft: Secondary | ICD-10-CM | POA: Insufficient documentation

## 2019-09-01 DIAGNOSIS — J322 Chronic ethmoidal sinusitis: Secondary | ICD-10-CM | POA: Diagnosis not present

## 2019-09-01 DIAGNOSIS — K219 Gastro-esophageal reflux disease without esophagitis: Secondary | ICD-10-CM | POA: Diagnosis not present

## 2019-09-01 DIAGNOSIS — Z888 Allergy status to other drugs, medicaments and biological substances status: Secondary | ICD-10-CM | POA: Insufficient documentation

## 2019-09-01 DIAGNOSIS — Z885 Allergy status to narcotic agent status: Secondary | ICD-10-CM | POA: Diagnosis not present

## 2019-09-01 DIAGNOSIS — N419 Inflammatory disease of prostate, unspecified: Secondary | ICD-10-CM | POA: Insufficient documentation

## 2019-09-01 DIAGNOSIS — I252 Old myocardial infarction: Secondary | ICD-10-CM | POA: Diagnosis not present

## 2019-09-01 DIAGNOSIS — I2510 Atherosclerotic heart disease of native coronary artery without angina pectoris: Secondary | ICD-10-CM | POA: Diagnosis not present

## 2019-09-01 DIAGNOSIS — J329 Chronic sinusitis, unspecified: Secondary | ICD-10-CM | POA: Diagnosis not present

## 2019-09-01 DIAGNOSIS — G4733 Obstructive sleep apnea (adult) (pediatric): Secondary | ICD-10-CM | POA: Diagnosis not present

## 2019-09-01 DIAGNOSIS — M19049 Primary osteoarthritis, unspecified hand: Secondary | ICD-10-CM | POA: Diagnosis not present

## 2019-09-01 DIAGNOSIS — E785 Hyperlipidemia, unspecified: Secondary | ICD-10-CM | POA: Diagnosis not present

## 2019-09-01 DIAGNOSIS — J3489 Other specified disorders of nose and nasal sinuses: Secondary | ICD-10-CM | POA: Diagnosis not present

## 2019-09-01 HISTORY — PX: ETHMOIDECTOMY: SHX5197

## 2019-09-01 SURGERY — ETHMOIDECTOMY
Anesthesia: General | Site: Nose | Laterality: Right

## 2019-09-01 MED ORDER — PROPOFOL 10 MG/ML IV BOLUS
INTRAVENOUS | Status: DC | PRN
  Administered 2019-09-01: 120 mg via INTRAVENOUS

## 2019-09-01 MED ORDER — PROMETHAZINE HCL 25 MG RE SUPP: 25.0000 mg | Freq: Four times a day (QID) | RECTAL | 1 refills | Status: DC | PRN

## 2019-09-01 MED ORDER — ONDANSETRON HCL 4 MG/2ML IJ SOLN
INTRAMUSCULAR | Status: AC
  Filled 2019-09-01: qty 2

## 2019-09-01 MED ORDER — DEXAMETHASONE SODIUM PHOSPHATE 10 MG/ML IJ SOLN
INTRAMUSCULAR | Status: AC
  Filled 2019-09-01: qty 1

## 2019-09-01 MED ORDER — ARTIFICIAL TEARS OPHTHALMIC OINT
TOPICAL_OINTMENT | OPHTHALMIC | Status: DC | PRN
  Administered 2019-09-01: 1 via OPHTHALMIC

## 2019-09-01 MED ORDER — OXYMETAZOLINE HCL 0.05 % NA SOLN
2.0000 | NASAL | Status: DC
  Administered 2019-09-01 (×2): 2 via NASAL

## 2019-09-01 MED ORDER — OXYMETAZOLINE HCL 0.05 % NA SOLN
NASAL | Status: DC | PRN
  Administered 2019-09-01: 1 via TOPICAL

## 2019-09-01 MED ORDER — ROCURONIUM BROMIDE 10 MG/ML (PF) SYRINGE
PREFILLED_SYRINGE | INTRAVENOUS | Status: DC | PRN
  Administered 2019-09-01: 50 mg via INTRAVENOUS

## 2019-09-01 MED ORDER — PHENYLEPHRINE 40 MCG/ML (10ML) SYRINGE FOR IV PUSH (FOR BLOOD PRESSURE SUPPORT)
PREFILLED_SYRINGE | INTRAVENOUS | Status: DC | PRN
  Administered 2019-09-01 (×2): 100 ug via INTRAVENOUS

## 2019-09-01 MED ORDER — ONDANSETRON HCL 4 MG/2ML IJ SOLN: 4.0000 mg | Freq: Four times a day (QID) | INTRAMUSCULAR | Status: DC | PRN

## 2019-09-01 MED ORDER — ONDANSETRON HCL 4 MG/2ML IJ SOLN
INTRAMUSCULAR | Status: DC | PRN
  Administered 2019-09-01: 4 mg via INTRAVENOUS

## 2019-09-01 MED ORDER — DEXAMETHASONE SODIUM PHOSPHATE 10 MG/ML IJ SOLN
INTRAMUSCULAR | Status: DC | PRN
  Administered 2019-09-01: 8 mg via INTRAVENOUS

## 2019-09-01 MED ORDER — OXYMETAZOLINE HCL 0.05 % NA SOLN
NASAL | Status: AC
  Filled 2019-09-01: qty 30

## 2019-09-01 MED ORDER — LIDOCAINE-EPINEPHRINE 1 %-1:100000 IJ SOLN
INTRAMUSCULAR | Status: DC | PRN
  Administered 2019-09-01: 1 mL

## 2019-09-01 MED ORDER — LIDOCAINE HCL (CARDIAC) PF 100 MG/5ML IV SOSY
PREFILLED_SYRINGE | INTRAVENOUS | Status: DC | PRN
  Administered 2019-09-01: 100 mg via INTRAVENOUS

## 2019-09-01 MED ORDER — LIDOCAINE 2% (20 MG/ML) 5 ML SYRINGE
INTRAMUSCULAR | Status: AC
  Filled 2019-09-01: qty 5

## 2019-09-01 MED ORDER — LIDOCAINE-EPINEPHRINE 1 %-1:100000 IJ SOLN
INTRAMUSCULAR | Status: AC
  Filled 2019-09-01: qty 1

## 2019-09-01 MED ORDER — SUGAMMADEX SODIUM 200 MG/2ML IV SOLN
INTRAVENOUS | Status: DC | PRN
  Administered 2019-09-01: 360 mg via INTRAVENOUS

## 2019-09-01 MED ORDER — ROCURONIUM BROMIDE 10 MG/ML (PF) SYRINGE
PREFILLED_SYRINGE | INTRAVENOUS | Status: AC
  Filled 2019-09-01: qty 10

## 2019-09-01 MED ORDER — LACTATED RINGERS IV SOLN
INTRAVENOUS | Status: DC
  Administered 2019-09-01: 09:00:00 via INTRAVENOUS

## 2019-09-01 MED ORDER — FENTANYL CITRATE (PF) 100 MCG/2ML IJ SOLN
INTRAMUSCULAR | Status: AC
  Filled 2019-09-01: qty 2

## 2019-09-01 MED ORDER — FENTANYL CITRATE (PF) 100 MCG/2ML IJ SOLN
INTRAMUSCULAR | Status: DC | PRN
  Administered 2019-09-01: 100 ug via INTRAVENOUS

## 2019-09-01 MED ORDER — BUPIVACAINE HCL (PF) 0.25 % IJ SOLN
INTRAMUSCULAR | Status: AC
  Filled 2019-09-01: qty 30

## 2019-09-01 MED ORDER — PROPOFOL 10 MG/ML IV BOLUS
INTRAVENOUS | Status: AC
  Filled 2019-09-01: qty 20

## 2019-09-01 MED ORDER — FENTANYL CITRATE (PF) 100 MCG/2ML IJ SOLN: 25.0000 ug | INTRAMUSCULAR | Status: DC | PRN

## 2019-09-01 MED ORDER — BACITRACIN-NEOMYCIN-POLYMYXIN 400-5-5000 EX OINT
TOPICAL_OINTMENT | CUTANEOUS | Status: AC
  Filled 2019-09-01: qty 1

## 2019-09-01 MED ORDER — EPHEDRINE SULFATE-NACL 50-0.9 MG/10ML-% IV SOSY
PREFILLED_SYRINGE | INTRAVENOUS | Status: DC | PRN
  Administered 2019-09-01: 10 mg via INTRAVENOUS
  Administered 2019-09-01: 5 mg via INTRAVENOUS

## 2019-09-01 MED ORDER — HYDROCODONE-ACETAMINOPHEN 7.5-325 MG PO TABS: 1.0000 | ORAL_TABLET | Freq: Four times a day (QID) | ORAL | 0 refills | Status: DC | PRN

## 2019-09-01 MED ORDER — BACITRACIN ZINC 500 UNIT/GM EX OINT
TOPICAL_OINTMENT | CUTANEOUS | Status: AC
  Filled 2019-09-01: qty 28.35

## 2019-09-01 SURGICAL SUPPLY — 52 items
ATTRACTOMAT 16X20 MAGNETIC DRP (DRAPES) ×1 IMPLANT
BLADE RAD40 ROTATE 4M 4 5PK (BLADE) IMPLANT
BLADE RAD40 ROTATE 4M 4MM 5PK (BLADE)
BLADE RAD60 ROTATE M4 4 5PK (BLADE) IMPLANT
BLADE RAD60 ROTATE M4 4MM 5PK (BLADE)
BLADE TRICUT ROTATE M4 4 5PK (BLADE) ×1 IMPLANT
BLADE TRICUT ROTATE M4 4MM 5PK (BLADE) ×1
BUR HS RAD FRONTAL 3 (BURR) IMPLANT
BUR HS RAD FRONTAL 3MM (BURR)
CANISTER SUC SOCK COL 7IN (MISCELLANEOUS) ×2 IMPLANT
CANISTER SUCT 1200ML W/VALVE (MISCELLANEOUS) ×4 IMPLANT
CORD BIPOLAR FORCEPS 12FT (ELECTRODE) IMPLANT
COVER WAND RF STERILE (DRAPES) IMPLANT
DECANTER SPIKE VIAL GLASS SM (MISCELLANEOUS) ×2 IMPLANT
DRESSING NASAL KENNEDY 3.5X.9 (MISCELLANEOUS) IMPLANT
DRSG CURAD 3X16 NADH (PACKING) IMPLANT
DRSG NASAL KENNEDY 3.5X.9 (MISCELLANEOUS)
DRSG NASAL KENNEDY LMNT 8CM (GAUZE/BANDAGES/DRESSINGS) IMPLANT
DRSG NASOPORE 8CM (GAUZE/BANDAGES/DRESSINGS) IMPLANT
DRSG TELFA 3X8 NADH (GAUZE/BANDAGES/DRESSINGS) IMPLANT
GAUZE 4X4 16PLY RFD (DISPOSABLE) IMPLANT
GAUZE VASELINE FOILPK 1/2 X 72 (GAUZE/BANDAGES/DRESSINGS) IMPLANT
GLOVE BIO SURGEON STRL SZ 6.5 (GLOVE) ×1 IMPLANT
GLOVE BIO SURGEONS STRL SZ 6.5 (GLOVE) ×1
GLOVE BIOGEL PI IND STRL 7.0 (GLOVE) IMPLANT
GLOVE BIOGEL PI INDICATOR 7.0 (GLOVE) ×2
GLOVE ECLIPSE 7.5 STRL STRAW (GLOVE) ×3 IMPLANT
GOWN STRL REUS W/ TWL LRG LVL3 (GOWN DISPOSABLE) ×1 IMPLANT
GOWN STRL REUS W/ TWL XL LVL3 (GOWN DISPOSABLE) ×1 IMPLANT
GOWN STRL REUS W/TWL LRG LVL3 (GOWN DISPOSABLE) ×3
GOWN STRL REUS W/TWL XL LVL3 (GOWN DISPOSABLE) ×3
HEMOSTAT SURGICEL .5X2 ABSORB (HEMOSTASIS) IMPLANT
IV NS 500ML (IV SOLUTION) ×3
IV NS 500ML BAXH (IV SOLUTION) IMPLANT
NDL PRECISIONGLIDE 27X1.5 (NEEDLE) ×1 IMPLANT
NDL SPNL 25GX3.5 QUINCKE BL (NEEDLE) IMPLANT
NEEDLE PRECISIONGLIDE 27X1.5 (NEEDLE) ×3 IMPLANT
NEEDLE SPNL 25GX3.5 QUINCKE BL (NEEDLE) IMPLANT
NS IRRIG 1000ML POUR BTL (IV SOLUTION) ×2 IMPLANT
PACK BASIN DAY SURGERY FS (CUSTOM PROCEDURE TRAY) ×3 IMPLANT
PACK ENT DAY SURGERY (CUSTOM PROCEDURE TRAY) ×3 IMPLANT
PAD DRESSING TELFA 3X8 NADH (GAUZE/BANDAGES/DRESSINGS) IMPLANT
PATTIES SURGICAL .5 X3 (DISPOSABLE) ×3 IMPLANT
SLEEVE SCD COMPRESS KNEE MED (MISCELLANEOUS) ×2 IMPLANT
SOLUTION BUTLER CLEAR DIP (MISCELLANEOUS) ×3 IMPLANT
SPONGE GAUZE 2X2 8PLY STER LF (GAUZE/BANDAGES/DRESSINGS) ×1
SPONGE GAUZE 2X2 8PLY STRL LF (GAUZE/BANDAGES/DRESSINGS) ×2 IMPLANT
SPONGE SURGIFOAM ABS GEL 12-7 (HEMOSTASIS) IMPLANT
TOWEL GREEN STERILE FF (TOWEL DISPOSABLE) ×6 IMPLANT
TUBE CONNECTING 20'X1/4 (TUBING) ×1
TUBE CONNECTING 20X1/4 (TUBING) ×1 IMPLANT
YANKAUER SUCT BULB TIP NO VENT (SUCTIONS) ×3 IMPLANT

## 2019-09-01 NOTE — Op Note (Signed)
OPERATIVE REPORT  DATE OF SURGERY: 09/01/2019  PATIENT:  Sean Hammond,  78 y.o. male  PRE-OPERATIVE DIAGNOSIS: Chronic ethmoid sinusitis  POST-OPERATIVE DIAGNOSIS: Chronic ethmoid sinusitis  PROCEDURE:  Procedure(s): RIGHT REVISION ENDOSCOPIC ETHMOIDECTOMY  SURGEON:  Beckie Salts, MD  ASSISTANTS: None  ANESTHESIA:   General   EBL: 10 ml  DRAINS: None  LOCAL MEDICATIONS USED: 1% Xylocaine with epinephrine  SPECIMEN: Right sinus contents  COUNTS:  Correct  PROCEDURE DETAILS: The patient was taken to the operating room and placed on the operating table in the supine position. Following induction of general endotracheal anesthesia, the face was draped in a standard fashion for sinus surgery.  Afrin spray was used preoperatively in the nasal cavity.  The 0 degree endoscope was used to inspect the right nasal cavity and the right middle turbinate/anterior ethmoid area was identified and infiltrated with local anesthetic solution.  There was purulent secretions immediately identified draining from this 1 anterior ethmoid cell.  After adequate anesthetic was achieved the microdebrider was then used to completely open this 1 cell and take down any septations surrounding it.  The remainder of the ethmoid sinus was completely open and with healthy mucosa.  The frontal recess was also completely and widely patent.  A pledget with adrenaline was placed in the region and removed just at extubation.  Patient was awakened extubated and transferred to recovery in stable condition.    PATIENT DISPOSITION:  To PACU, stable

## 2019-09-01 NOTE — Transfer of Care (Signed)
Immediate Anesthesia Transfer of Care Note  Patient: Sean Hammond  Procedure(s) Performed: RIGHT REVISION ENDOSCOPIC ETHMOIDECTOMY (Right Nose)  Patient Location: PACU  Anesthesia Type:General  Level of Consciousness: awake, alert , oriented and patient cooperative  Airway & Oxygen Therapy: Patient Spontanous Breathing  Post-op Assessment: Report given to RN and Post -op Vital signs reviewed and stable  Post vital signs: Reviewed and stable  Last Vitals:  Vitals Value Taken Time  BP 136/78 09/01/19 0932  Temp    Pulse 83 09/01/19 0934  Resp 12 09/01/19 0934  SpO2 99 % 09/01/19 0934    Last Pain:  Vitals:   09/01/19 0733  TempSrc: Oral  PainSc: 2          Complications: No apparent anesthesia complications

## 2019-09-01 NOTE — Anesthesia Procedure Notes (Signed)
Procedure Name: Intubation Date/Time: 09/01/2019 9:00 AM Performed by: Raenette Rover, CRNA Pre-anesthesia Checklist: Patient identified, Emergency Drugs available, Suction available and Patient being monitored Patient Re-evaluated:Patient Re-evaluated prior to induction Oxygen Delivery Method: Circle system utilized Preoxygenation: Pre-oxygenation with 100% oxygen Induction Type: IV induction Ventilation: Mask ventilation without difficulty Laryngoscope Size: Miller and 3 Grade View: Grade I Tube type: Oral Tube size: 8.0 mm Number of attempts: 1 Airway Equipment and Method: Stylet Placement Confirmation: ETT inserted through vocal cords under direct vision,  breath sounds checked- equal and bilateral and positive ETCO2 Secured at: 22 cm Tube secured with: Tape Dental Injury: Teeth and Oropharynx as per pre-operative assessment

## 2019-09-01 NOTE — Interval H&P Note (Signed)
History and Physical Interval Note:  09/01/2019 8:21 AM  [name]  has presented today for surgery, with the diagnosis of J34.89 Sinus pain.  The various methods of treatment have been discussed with the patient and family. After consideration of risks, benefits and other options for treatment, the patient has consented to  Procedure(s): RIGHT REVISION ENDOSCOPIC ETHMOIDECTOMY (Right) as a surgical intervention.  The patient's history has been reviewed, patient examined, no change in status, stable for surgery.  I have reviewed the patient's chart and labs.  Questions were answered to the patient's satisfaction.     Izora Gala

## 2019-09-01 NOTE — Anesthesia Postprocedure Evaluation (Signed)
Anesthesia Post Note  Patient: Sean Hammond  Procedure(s) Performed: RIGHT REVISION ENDOSCOPIC ETHMOIDECTOMY (Right Nose)     Patient location during evaluation: Phase II Anesthesia Type: General Level of consciousness: awake and alert Pain management: pain level controlled Vital Signs Assessment: post-procedure vital signs reviewed and stable Respiratory status: spontaneous breathing, nonlabored ventilation and respiratory function stable Cardiovascular status: blood pressure returned to baseline and stable Postop Assessment: no apparent nausea or vomiting Anesthetic complications: no    Last Vitals:  Vitals:   09/01/19 1000 09/01/19 1015  BP: 113/68 118/78  Pulse: 69 81  Resp: 13 16  Temp:  36.6 C  SpO2: 100% 97%    Last Pain:  Vitals:   09/01/19 1015  TempSrc:   PainSc: 0-No pain                 Lidia Collum

## 2019-09-01 NOTE — Discharge Instructions (Signed)
°  Post Anesthesia Home Care Instructions  Activity: Get plenty of rest for the remainder of the day. A responsible individual must stay with you for 24 hours following the procedure.  For the next 24 hours, DO NOT: -Drive a car -Paediatric nurse -Drink alcoholic beverages -Take any medication unless instructed by your physician -Make any legal decisions or sign important papers.  Meals: Start with liquid foods such as gelatin or soup. Progress to regular foods as tolerated. Avoid greasy, spicy, heavy foods. If nausea and/or vomiting occur, drink only clear liquids until the nausea and/or vomiting subsides. Call your physician if vomiting continues.  Special Instructions/Symptoms: Your throat may feel dry or sore from the anesthesia or the breathing tube placed in your throat during surgery. If this causes discomfort, gargle with warm salt water. The discomfort should disappear within 24 hours.  If you had a scopolamine patch placed behind your ear for the management of post- operative nausea and/or vomiting:  1. The medication in the patch is effective for 72 hours, after which it should be removed.  Wrap patch in a tissue and discard in the trash. Wash hands thoroughly with soap and water. 2. You may remove the patch earlier than 72 hours if you experience unpleasant side effects which may include dry mouth, dizziness or visual disturbances. 3. Avoid touching the patch. Wash your hands with soap and water after contact with the patch.      Call your surgeon if you experience:   1.  Fever over 101.0. 2.  Inability to urinate. 3.  Nausea and/or vomiting. 4.  Extreme swelling or bruising at the surgical site. 5.  Continued bleeding from the incision. 6.  Increased pain, redness or drainage from the incision. 7.  Problems related to your pain medication. 8.  Any problems and/or concerns   Start saline sinus irrigations tomorrow.  Avoid heavy lifting or any strenuous activity for 2  weeks.

## 2019-09-01 NOTE — Anesthesia Preprocedure Evaluation (Signed)
Anesthesia Evaluation  Patient identified by MRN, date of birth, ID band Patient awake    Reviewed: Allergy & Precautions, H&P , NPO status , Patient's Chart, lab work & pertinent test results  History of Anesthesia Complications (+) PONV and history of anesthetic complications  Airway Mallampati: II   Neck ROM: full    Dental   Pulmonary sleep apnea , former smoker,    breath sounds clear to auscultation       Cardiovascular + CAD, + Past MI and + Cardiac Stents   Rhythm:regular Rate:Normal     Neuro/Psych  Headaches,    GI/Hepatic GERD  ,  Endo/Other    Renal/GU      Musculoskeletal  (+) Arthritis ,   Abdominal   Peds  Hematology   Anesthesia Other Findings   Reproductive/Obstetrics                             Anesthesia Physical Anesthesia Plan  ASA: III  Anesthesia Plan: General   Post-op Pain Management:    Induction: Intravenous  PONV Risk Score and Plan: 3 and Ondansetron, Dexamethasone, Midazolam and Treatment may vary due to age or medical condition  Airway Management Planned: Oral ETT  Additional Equipment:   Intra-op Plan:   Post-operative Plan: Extubation in OR  Informed Consent: I have reviewed the patients History and Physical, chart, labs and discussed the procedure including the risks, benefits and alternatives for the proposed anesthesia with the patient or authorized representative who has indicated his/her understanding and acceptance.       Plan Discussed with: CRNA, Anesthesiologist and Surgeon  Anesthesia Plan Comments:         Anesthesia Quick Evaluation

## 2019-09-02 ENCOUNTER — Encounter (HOSPITAL_BASED_OUTPATIENT_CLINIC_OR_DEPARTMENT_OTHER): Payer: Self-pay | Admitting: Otolaryngology

## 2019-09-02 LAB — SURGICAL PATHOLOGY

## 2019-09-04 ENCOUNTER — Encounter (HOSPITAL_BASED_OUTPATIENT_CLINIC_OR_DEPARTMENT_OTHER): Payer: Self-pay | Admitting: *Deleted

## 2019-09-04 ENCOUNTER — Other Ambulatory Visit: Payer: Self-pay

## 2019-09-04 NOTE — Progress Notes (Addendum)
ADDENDUM:  Chart reviewed by anesthesia, Konrad Felix PA, ok to proceed.  Spoke w/ via phone for pre-op interview--- PT Lab needs dos----  Istat 8/  Current ekg in chart and epic COVID test ------ no repeat covid tested needed per dr Marcie Bal mda since pt had negative covid test 08-28-2019 (epic) and has been in quarantine since 08-28-2019.  Norris Cross AD aware and to let dr Jillyn Hidden mda know Arrive at ------- 1115 NPO after ------ MN w/ exception clear liquids until 0700 (no cream/milk products) then nothing by mouth Medications to take morning of surgery ----- Toprol, Protonix, Flomax Diabetic medication ----- n/a Patient Special Instructions ----- n/a Pre-Op special Istructions ----- n/a  Patient verbalized understanding of instructions that were given at this phone interview. Patient denies cardiac s&s, shortness of breath, chest pain, fever, cough a this phone interview.   Anesthesia Review:  Hx CAD s/p PCI with DES x2 to CFx 01/ 2013.  Pt stated has nitro but has never needed to take one.  Received clearance from dr Constance Holster , whom had performed pt surgery on 09-01-2019.  Pt stated he doing great no breathing issues or pain. Clearance is with chart.    PCP:  Dr Josetta Huddle Cardiologist :  Dr [name] for CAD (lov 12-17-2018 epic)  Chest x-ray : 05-14-2018 epic EKG : 08-11-2019 epic Echo : 03-22-2015 epic Stress test:  nulcear 06-17-2014  epic Cardiac Cath :   Last one 05-09-2016 epic Sleep Study/ CPAP : Yes/ No Fasting Blood Sugar :     N / AC Blood Thinner/ Instructions Maryjane Hurter Dose:  NO ASA / Instructions/ Last Dose :  ASA 81 mg/  Pt had stopped ASA prior to sinus surgery on 09-01-2019, last day 08-25-2019.Marland Kitchen  Pt had telephone cardiac clearance to stop asa in epic without further work-up.

## 2019-09-05 ENCOUNTER — Encounter (HOSPITAL_BASED_OUTPATIENT_CLINIC_OR_DEPARTMENT_OTHER)
Admission: RE | Disposition: A | Discharge status: Home / Self Care | Payer: Self-pay | Source: Home / Self Care | Attending: Urology

## 2019-09-05 ENCOUNTER — Encounter (HOSPITAL_BASED_OUTPATIENT_CLINIC_OR_DEPARTMENT_OTHER): Payer: Self-pay | Admitting: *Deleted

## 2019-09-05 ENCOUNTER — Ambulatory Visit (HOSPITAL_BASED_OUTPATIENT_CLINIC_OR_DEPARTMENT_OTHER): Payer: Medicare Other | Admitting: Physician Assistant

## 2019-09-05 ENCOUNTER — Ambulatory Visit (HOSPITAL_BASED_OUTPATIENT_CLINIC_OR_DEPARTMENT_OTHER)
Admission: RE | Admit: 2019-09-05 | Discharge: 2019-09-05 | Disposition: A | Discharge status: Home / Self Care | Payer: Medicare Other | Attending: Urology | Admitting: Urology

## 2019-09-05 DIAGNOSIS — N401 Enlarged prostate with lower urinary tract symptoms: Secondary | ICD-10-CM | POA: Diagnosis not present

## 2019-09-05 DIAGNOSIS — R310 Gross hematuria: Secondary | ICD-10-CM | POA: Diagnosis not present

## 2019-09-05 DIAGNOSIS — Z888 Allergy status to other drugs, medicaments and biological substances status: Secondary | ICD-10-CM | POA: Insufficient documentation

## 2019-09-05 DIAGNOSIS — Z87891 Personal history of nicotine dependence: Secondary | ICD-10-CM | POA: Diagnosis not present

## 2019-09-05 DIAGNOSIS — Z96652 Presence of left artificial knee joint: Secondary | ICD-10-CM | POA: Diagnosis not present

## 2019-09-05 DIAGNOSIS — I252 Old myocardial infarction: Secondary | ICD-10-CM | POA: Insufficient documentation

## 2019-09-05 DIAGNOSIS — C763 Malignant neoplasm of pelvis: Secondary | ICD-10-CM | POA: Diagnosis not present

## 2019-09-05 DIAGNOSIS — N1330 Unspecified hydronephrosis: Secondary | ICD-10-CM | POA: Diagnosis not present

## 2019-09-05 DIAGNOSIS — I2510 Atherosclerotic heart disease of native coronary artery without angina pectoris: Secondary | ICD-10-CM | POA: Insufficient documentation

## 2019-09-05 DIAGNOSIS — G4733 Obstructive sleep apnea (adult) (pediatric): Secondary | ICD-10-CM | POA: Diagnosis not present

## 2019-09-05 DIAGNOSIS — R338 Other retention of urine: Secondary | ICD-10-CM | POA: Insufficient documentation

## 2019-09-05 DIAGNOSIS — N400 Enlarged prostate without lower urinary tract symptoms: Secondary | ICD-10-CM | POA: Diagnosis not present

## 2019-09-05 DIAGNOSIS — Z885 Allergy status to narcotic agent status: Secondary | ICD-10-CM | POA: Diagnosis not present

## 2019-09-05 DIAGNOSIS — E785 Hyperlipidemia, unspecified: Secondary | ICD-10-CM | POA: Diagnosis not present

## 2019-09-05 DIAGNOSIS — K219 Gastro-esophageal reflux disease without esophagitis: Secondary | ICD-10-CM | POA: Insufficient documentation

## 2019-09-05 DIAGNOSIS — Z85820 Personal history of malignant melanoma of skin: Secondary | ICD-10-CM | POA: Diagnosis not present

## 2019-09-05 DIAGNOSIS — Z955 Presence of coronary angioplasty implant and graft: Secondary | ICD-10-CM | POA: Diagnosis not present

## 2019-09-05 DIAGNOSIS — Z87442 Personal history of urinary calculi: Secondary | ICD-10-CM | POA: Insufficient documentation

## 2019-09-05 DIAGNOSIS — C651 Malignant neoplasm of right renal pelvis: Secondary | ICD-10-CM | POA: Insufficient documentation

## 2019-09-05 DIAGNOSIS — D4111 Neoplasm of uncertain behavior of right renal pelvis: Secondary | ICD-10-CM | POA: Diagnosis not present

## 2019-09-05 HISTORY — PX: CYSTOSCOPY/RETROGRADE/URETEROSCOPY: SHX5316

## 2019-09-05 HISTORY — DX: Old myocardial infarction: I25.2

## 2019-09-05 HISTORY — PX: HOLMIUM LASER APPLICATION: SHX5852

## 2019-09-05 HISTORY — DX: Hematuria, unspecified: R31.9

## 2019-09-05 HISTORY — DX: Personal history of other diseases of male genital organs: Z87.438

## 2019-09-05 HISTORY — DX: Personal history of malignant melanoma of skin: Z98.890

## 2019-09-05 HISTORY — DX: Other specified postprocedural states: Z85.820

## 2019-09-05 HISTORY — DX: Personal history of urinary (tract) infections: Z87.440

## 2019-09-05 HISTORY — DX: Personal history of other diseases of urinary system: Z87.448

## 2019-09-05 HISTORY — DX: Presence of spectacles and contact lenses: Z97.3

## 2019-09-05 HISTORY — DX: Personal history of other malignant neoplasm of skin: Z85.828

## 2019-09-05 HISTORY — DX: Unspecified hydronephrosis: N13.30

## 2019-09-05 HISTORY — DX: Personal history of urinary calculi: Z87.442

## 2019-09-05 HISTORY — DX: Benign prostatic hyperplasia without lower urinary tract symptoms: N40.0

## 2019-09-05 LAB — POCT I-STAT, CHEM 8
BUN: 19 mg/dL (ref 8–23)
Calcium, Ion: 1.3 mmol/L (ref 1.15–1.40)
Chloride: 104 mmol/L (ref 98–111)
Creatinine, Ser: 1 mg/dL (ref 0.61–1.24)
Glucose, Bld: 96 mg/dL (ref 70–99)
HCT: 48 % (ref 39.0–52.0)
Hemoglobin: 16.3 g/dL (ref 13.0–17.0)
Potassium: 4 mmol/L (ref 3.5–5.1)
Sodium: 139 mmol/L (ref 135–145)
TCO2: 22 mmol/L (ref 22–32)

## 2019-09-05 SURGERY — CYSTOSCOPY/RETROGRADE/URETEROSCOPY
Anesthesia: General | Site: Renal | Laterality: Right

## 2019-09-05 MED ORDER — EPHEDRINE 5 MG/ML INJ
INTRAVENOUS | Status: AC
  Filled 2019-09-05: qty 10

## 2019-09-05 MED ORDER — FENTANYL CITRATE (PF) 100 MCG/2ML IJ SOLN
INTRAMUSCULAR | Status: AC
  Filled 2019-09-05: qty 2

## 2019-09-05 MED ORDER — EPHEDRINE SULFATE-NACL 50-0.9 MG/10ML-% IV SOSY
PREFILLED_SYRINGE | INTRAVENOUS | Status: DC | PRN
  Administered 2019-09-05 (×3): 10 mg via INTRAVENOUS
  Administered 2019-09-05: 5 mg via INTRAVENOUS

## 2019-09-05 MED ORDER — PHENYLEPHRINE 40 MCG/ML (10ML) SYRINGE FOR IV PUSH (FOR BLOOD PRESSURE SUPPORT)
PREFILLED_SYRINGE | INTRAVENOUS | Status: DC | PRN
  Administered 2019-09-05: 80 ug via INTRAVENOUS
  Administered 2019-09-05: 120 ug via INTRAVENOUS

## 2019-09-05 MED ORDER — PROPOFOL 10 MG/ML IV BOLUS
INTRAVENOUS | Status: AC
  Filled 2019-09-05: qty 20

## 2019-09-05 MED ORDER — CEFTRIAXONE SODIUM 1 G IJ SOLR
INTRAMUSCULAR | Status: AC
  Filled 2019-09-05: qty 10

## 2019-09-05 MED ORDER — ONDANSETRON HCL 4 MG/2ML IJ SOLN
INTRAMUSCULAR | Status: DC | PRN
  Administered 2019-09-05: 4 mg via INTRAVENOUS

## 2019-09-05 MED ORDER — DEXAMETHASONE SODIUM PHOSPHATE 10 MG/ML IJ SOLN
INTRAMUSCULAR | Status: DC | PRN
  Administered 2019-09-05: 8 mg via INTRAVENOUS

## 2019-09-05 MED ORDER — FENTANYL CITRATE (PF) 100 MCG/2ML IJ SOLN
25.0000 ug | INTRAMUSCULAR | Status: DC | PRN
  Filled 2019-09-05: qty 1

## 2019-09-05 MED ORDER — SODIUM CHLORIDE 0.9 % IV SOLN
INTRAVENOUS | Status: AC
  Filled 2019-09-05: qty 100

## 2019-09-05 MED ORDER — PHENYLEPHRINE 40 MCG/ML (10ML) SYRINGE FOR IV PUSH (FOR BLOOD PRESSURE SUPPORT)
PREFILLED_SYRINGE | INTRAVENOUS | Status: AC
  Filled 2019-09-05: qty 10

## 2019-09-05 MED ORDER — HYDROCODONE-ACETAMINOPHEN 7.5-325 MG PO TABS: 1.0000 | ORAL_TABLET | Freq: Four times a day (QID) | ORAL | 0 refills | Status: DC | PRN

## 2019-09-05 MED ORDER — SODIUM CHLORIDE 0.9 % IV SOLN
INTRAVENOUS | Status: DC
  Administered 2019-09-05 (×2): via INTRAVENOUS
  Filled 2019-09-05: qty 1000

## 2019-09-05 MED ORDER — SODIUM CHLORIDE 0.9 % IV SOLN
1.0000 g | INTRAVENOUS | Status: AC
  Administered 2019-09-05: 1 g via INTRAVENOUS
  Filled 2019-09-05: qty 10

## 2019-09-05 MED ORDER — LIDOCAINE HCL (CARDIAC) PF 100 MG/5ML IV SOSY
PREFILLED_SYRINGE | INTRAVENOUS | Status: DC | PRN
  Administered 2019-09-05: 100 mg via INTRAVENOUS

## 2019-09-05 MED ORDER — DEXAMETHASONE SODIUM PHOSPHATE 10 MG/ML IJ SOLN
INTRAMUSCULAR | Status: AC
  Filled 2019-09-05: qty 1

## 2019-09-05 MED ORDER — ONDANSETRON HCL 4 MG/2ML IJ SOLN
4.0000 mg | Freq: Once | INTRAMUSCULAR | Status: DC | PRN
  Filled 2019-09-05: qty 2

## 2019-09-05 MED ORDER — LIDOCAINE 2% (20 MG/ML) 5 ML SYRINGE
INTRAMUSCULAR | Status: AC
  Filled 2019-09-05: qty 5

## 2019-09-05 MED ORDER — ONDANSETRON HCL 4 MG/2ML IJ SOLN
INTRAMUSCULAR | Status: AC
  Filled 2019-09-05: qty 2

## 2019-09-05 MED ORDER — PROPOFOL 10 MG/ML IV BOLUS
INTRAVENOUS | Status: DC | PRN
  Administered 2019-09-05: 200 mg via INTRAVENOUS

## 2019-09-05 MED ORDER — FENTANYL CITRATE (PF) 100 MCG/2ML IJ SOLN
INTRAMUSCULAR | Status: DC | PRN
  Administered 2019-09-05: 50 ug via INTRAVENOUS
  Administered 2019-09-05 (×2): 25 ug via INTRAVENOUS

## 2019-09-05 SURGICAL SUPPLY — 25 items
BAG DRAIN URO-CYSTO SKYTR STRL (DRAIN) ×4 IMPLANT
BAG DRN UROCATH (DRAIN) ×2
BASKET LASER NITINOL 1.9FR (BASKET) ×2 IMPLANT
BSKT STON RTRVL 120 1.9FR (BASKET) ×2
CATH INTERMIT  6FR 70CM (CATHETERS) IMPLANT
CLOTH BEACON ORANGE TIMEOUT ST (SAFETY) ×4 IMPLANT
FIBER LASER FLEXIVA 365 (UROLOGICAL SUPPLIES) IMPLANT
FIBER LASER TRAC TIP (UROLOGICAL SUPPLIES) ×2 IMPLANT
GLOVE BIO SURGEON STRL SZ7.5 (GLOVE) ×4 IMPLANT
GOWN STRL REUS W/TWL LRG LVL3 (GOWN DISPOSABLE) ×4 IMPLANT
GUIDEWIRE ANG ZIPWIRE 038X150 (WIRE) ×4 IMPLANT
GUIDEWIRE STR DUAL SENSOR (WIRE) ×4 IMPLANT
IV NS 1000ML (IV SOLUTION) ×4
IV NS 1000ML BAXH (IV SOLUTION) ×2 IMPLANT
IV NS IRRIG 3000ML ARTHROMATIC (IV SOLUTION) ×4 IMPLANT
KIT TURNOVER CYSTO (KITS) ×4 IMPLANT
MANIFOLD NEPTUNE II (INSTRUMENTS) ×4 IMPLANT
NS IRRIG 500ML POUR BTL (IV SOLUTION) ×8 IMPLANT
PACK CYSTO (CUSTOM PROCEDURE TRAY) ×4 IMPLANT
STENT POLARIS 5FRX26 (STENTS) ×2 IMPLANT
SYR 10ML LL (SYRINGE) ×4 IMPLANT
TUBE CONNECTING 12'X1/4 (SUCTIONS)
TUBE CONNECTING 12X1/4 (SUCTIONS) IMPLANT
TUBE FEEDING 8FR 16IN STR KANG (MISCELLANEOUS) IMPLANT
TUBING UROLOGY SET (TUBING) IMPLANT

## 2019-09-05 NOTE — Op Note (Signed)
NAME: Sean Hammond, Sean Hammond MEDICAL RECORD [id number] ACCOUNT [id number] DATE OF BIRTH:1940-12-07 FACILITY: [facility] LOCATION: [facility] PHYSICIAN:Delana Manganello, MD  OPERATIVE REPORT  DATE OF PROCEDURE:  09/05/2019  PREOPERATIVE DIAGNOSIS:  Gross hematuria, right hydronephrosis, rule out right ureteral neoplasm.  POSTOPERATIVE DIAGNOSIS:  Right renal pelvis cancer.  PROCEDURE: 1.  Cystoscopy, bilateral retrograde pyelograms, interpretation. 2.  Right ureteroscopy with biopsy. 3.  Laser ablation of right renal pelvis tumor.  ESTIMATED BLOOD LOSS:  Nil.  COMPLICATIONS:  None.  SPECIMEN:  Right upper pole renal pelvis mass fragments for pathology.  FINDINGS: 1.  Unremarkable left retrograde pyelogram. 2.  Stable bilobar prostatic hypertrophy. 3.  Unremarkable right retrograde pyelogram. 4.  Right upper pole papillary renal pelvis tumor. 5.  Successful placement of right ureteral stent proximal end in the renal pelvis, distal end urinary bladder.  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  His prostatic hypertrophy is much improved on medical therapy.  He unfortunately has developed recurrent hematuria  and noncontrast imaging revealed some right hydronephrosis without obvious stone or infectious parameters.  This was concerning for possible intraluminal neoplasm on the right side.  Options were discussed including recommended path, diagnostic  ureteroscopy on the right to help maximally rule this out and he wished to proceed.  Informed consent was obtained and placed in medical record.  DESCRIPTION OF PROCEDURE:  The patient was identified.  Procedure being cystoscopy, bilateral retrogrades, right diagnostic ureteroscopy confirmed.  Procedure timeout was performed.  IV antibiotics administered.  General LMA anesthesia induced.  The  patient was placed into a low lithotomy position, sterile field  was created by prepping and draping the patient's penis, perineum and proximal thighs using iodine.  Cystourethroscopy was performed with 21-French rigid cystoscope with offset lens.   Inspection of anterior and posterior urethra revealed stable bilobar prostatic hypertrophy.  Inspection in urinary bladder revealed no diverticula, calcifications, papillary lesions.  The left ureteral orifice was cannulated with 6-French renal catheter  and left retrograde pyelogram was obtained.  Left retrograde pyelogram demonstrates a single left ureter single system left kidney.  No filling defects or narrowing noted.  Similarly, right retrograde pyelogram was obtained.  Right retrograde pyelogram demonstrated a single right ureter single system right kidney.  No filling defects or narrowing noted.  A 0.038 ZIPwire was advanced to the level of the pole and set aside as a safety wire.  An 8-French feeding tube was placed  in the urinary bladder for pressure release, and semirigid ureteroscopy was performed of the distal 4/5 of the right ureter alongside a separate sensor working wire.  No mucosal abnormalities were found.  Next, the semirigid scope was exchanged for the  single channel flexible digital ureteroscope over the Sensor working wire to the level of the kidney and systematic inspection was performed.  Somewhat surprisingly the patient had a significant volume of extreme upper pole renal pelvis tumor nearly  obliterating an upper pole infundibulum and calix.  This was likely felt to be the source of his ongoing hematuria.  It is clearly felt that biopsy and possible ablation would be warranted.  Biopsy was then performed using 2 different techniques, one  with endoscopic cold cup biopsy forceps and the other with escape basket snare representative pieces of the small tumor and these fragments were set aside from pathology.  The volume of tumor does appear amenable to endoscopic resection.  Following his  final  pathology corroborates  low grade.  It was felt that since there was good visualization that first-stage ablation would be warranted at present.  As such, a 200 nanometer fiber was used and holmium laser energy was used to ablate the area of visible  tumor using settings of 1 joule and 5 Hz and an off-focused technique and visible areas of papillary tumors were ablated from the upper pole infundibulum and calix down to what appeared to be level of the superficial fibrous stroma of the upper pole  infundibulum and urothelium.  Hemostasis appeared excellent.  It was clearly felt that interval stenting with a nontethered stent would be warranted.  Access sheath was removed under continuous vision, no mucosal abnormalities were found, and a new 5 x  26 Polaris-type stent was placed with remaining safety wire using fluoroscopic guidance.  Good proximal and distal plane were noted, and the procedure was terminated.  The patient tolerated the procedure well.  No immediate perioperative complications.   The patient was taken to postanesthesia care in stable condition with plan for discharge home.  TN/NUANCE  D:09/05/2019 T:09/05/2019 JOB:[id number]

## 2019-09-05 NOTE — Anesthesia Postprocedure Evaluation (Signed)
Anesthesia Post Note  Patient: Sean Hammond  Procedure(s) Performed: CYSTOSCOPY/ BILATERAL RETROGRADE/ RIGHT  URETEROSCOPY with biopsy with laser ablation of tumor (Bilateral Renal) HOLMIUM LASER APPLICATION (Right Renal)     Patient location during evaluation: PACU Anesthesia Type: General Level of consciousness: awake and alert Pain management: pain level controlled Vital Signs Assessment: post-procedure vital signs reviewed and stable Respiratory status: spontaneous breathing, nonlabored ventilation, respiratory function stable and patient connected to nasal cannula oxygen Cardiovascular status: blood pressure returned to baseline and stable Postop Assessment: no apparent nausea or vomiting Anesthetic complications: no    Last Vitals:  Vitals:   09/05/19 1330 09/05/19 1345  BP: 119/80   Pulse: 81 89  Resp: 15 13  Temp:    SpO2: 98% 100%    Last Pain:  Vitals:   09/05/19 1404  TempSrc:   PainSc: 0-No pain                 [name]

## 2019-09-05 NOTE — Anesthesia Procedure Notes (Signed)
Procedure Name: LMA Insertion Date/Time: 09/05/2019 11:57 AM Performed by: Raenette Rover, CRNA Pre-anesthesia Checklist: Patient identified, Emergency Drugs available, Suction available and Patient being monitored Patient Re-evaluated:Patient Re-evaluated prior to induction Oxygen Delivery Method: Circle system utilized Preoxygenation: Pre-oxygenation with 100% oxygen Induction Type: IV induction LMA: LMA inserted LMA Size: 4.0 Number of attempts: 1 Placement Confirmation: positive ETCO2 and breath sounds checked- equal and bilateral Tube secured with: Tape Dental Injury: Teeth and Oropharynx as per pre-operative assessment

## 2019-09-05 NOTE — Discharge Instructions (Signed)
1 - You may have urinary urgency (bladder spasms) and bloody urine on / off with stent in place. This is normal. ° °2 - Call MD or go to ER for fever >102, severe pain / nausea / vomiting not relieved by medications, or acute change in medical status ° °[facility] °[phone] °Post Ureteroscopy With or Without Stent Instructions ° °Definitions: ° °Ureter: The duct that transports urine from the kidney to the bladder. °Stent:   A plastic hollow tube that is placed into the ureter, from the kidney to the  bladder to prevent the ureter from swelling shut. ° °GENERAL INSTRUCTIONS: ° °Despite the fact that no skin incisions were used, the area around the ureter and bladder is raw and irritated. The stent is a foreign body which will further irritate the bladder wall. This irritation is manifested by increased frequency of urination, both day and night, and by an increase in the urge to urinate. In some, the urge to urinate is present almost always. Sometimes the urge is strong enough that you may not be able to stop yourself from urinating. The only real cure is to remove the stent and then give time for the bladder wall to heal which can't be done until the danger of the ureter swelling shut has passed, which varies. ° °You may see some blood in your urine while the stent is in place and a few days afterwards. Do not be alarmed, even if the urine was clear for a while. Get off your feet and drink lots of fluids until clearing occurs. If you start to pass clots or don't improve, call us. ° °DIET: °You may return to your normal diet immediately. Because of the raw surface of your bladder, alcohol, spicy foods, acid type foods and drinks with caffeine may cause irritation or frequency and should be used in moderation. To keep your urine flowing freely and to avoid constipation, drink plenty of fluids during the day ( 8-10 glasses ). °Tip: Avoid cranberry juice because it is very  acidic. ° °ACTIVITY: °Your physical activity doesn't need to be restricted. However, if you are very active, you may see some blood in your urine. We suggest that you reduce your activity under these circumstances until the bleeding has stopped. ° °BOWELS: °It is important to keep your bowels regular during the postoperative period. Straining with bowel movements can cause bleeding. A bowel movement every other day is reasonable. Use a mild laxative if needed, such as Milk of Magnesia 2-3 tablespoons, or 2 Dulcolax tablets. Call if you continue to have problems. If you have been taking narcotics for pain, before, during or after your surgery, you may be constipated. Take a laxative if necessary. ° ° °MEDICATION: °You should resume your pre-surgery medications unless told not to. In addition you will often be given an antibiotic to prevent infection. These should be taken as prescribed until the bottles are finished unless you are having an unusual reaction to one of the drugs. ° °PROBLEMS YOU SHOULD REPORT TO US: °· Fevers over 100.5 Fahrenheit. °· Heavy bleeding, or clots ( See above notes about blood in urine ). °· Inability to urinate. °· Drug reactions ( hives, rash, nausea, vomiting, diarrhea ). °· Severe burning or pain with urination that is not improving. ° °FOLLOW-UP: °You will need a follow-up appointment to monitor your progress. Call for this appointment at the number listed above. Usually the first appointment will be about three to fourteen days after your surgery. ° ° ° ° ° °  Post Anesthesia Home Care Instructions ° °Activity: °Get plenty of rest for the remainder of the day. A responsible individual must stay with you for 24 hours following the procedure.  °For the next 24 hours, DO NOT: °-Drive a car °-Operate machinery °-Drink alcoholic beverages °-Take any medication unless instructed by your physician °-Make any legal decisions or sign important papers. ° °Meals: °Start with liquid foods such as  gelatin or soup. Progress to regular foods as tolerated. Avoid greasy, spicy, heavy foods. If nausea and/or vomiting occur, drink only clear liquids until the nausea and/or vomiting subsides. Call your physician if vomiting continues. ° °Special Instructions/Symptoms: °Your throat may feel dry or sore from the anesthesia or the breathing tube placed in your throat during surgery. If this causes discomfort, gargle with warm salt water. The discomfort should disappear within 24 hours. ° °If you had a scopolamine patch placed behind your ear for the management of post- operative nausea and/or vomiting: ° °1. The medication in the patch is effective for 72 hours, after which it should be removed.  Wrap patch in a tissue and discard in the trash. Wash hands thoroughly with soap and water. °2. You may remove the patch earlier than 72 hours if you experience unpleasant side effects which may include dry mouth, dizziness or visual disturbances. °3. Avoid touching the patch. Wash your hands with soap and water after contact with the patch. °  ° ° °

## 2019-09-05 NOTE — Brief Op Note (Signed)
09/05/2019  12:55 PM  PATIENT:  Sean Hammond  78 y.o. male  PRE-OPERATIVE DIAGNOSIS:  RIGHT HYDRONEPHROSIS, HEMATURIA  POST-OPERATIVE DIAGNOSIS:  RIGHT HYDRONEPHROSIS, HEMATURIA  PROCEDURE:  Procedure(s): CYSTOSCOPY/ BILATERAL RETROGRADE/ RIGHT  URETEROSCOPY with biopsy with laser ablation of tumor (Bilateral) HOLMIUM LASER APPLICATION (Right)  SURGEON:  Surgeon(s) and Role:    Alexis Frock, MD - Primary  PHYSICIAN ASSISTANT:   ASSISTANTS: none   ANESTHESIA:   general  EBL:  minimal   BLOOD ADMINISTERED:none  DRAINS: none   LOCAL MEDICATIONS USED:  MARCAINE     SPECIMEN:  Source of Specimen:  right renal pelvis mass  DISPOSITION OF SPECIMEN:  PATHOLOGY  COUNTS:  YES  TOURNIQUET:  * No tourniquets in log *  DICTATION: .Other Dictation: Dictation Number  [id number]  PLAN OF CARE: Discharge to home after PACU  PATIENT DISPOSITION:  PACU - hemodynamically stable.   Delay start of Pharmacological VTE agent (>24hrs) due to surgical blood loss or risk of bleeding: yes

## 2019-09-05 NOTE — Anesthesia Preprocedure Evaluation (Addendum)
Anesthesia Evaluation  Patient identified by MRN, date of birth, ID band Patient awake    Reviewed: Allergy & Precautions, NPO status , Patient's Chart, lab work & pertinent test results  Airway Mallampati: II  TM Distance: >3 FB Neck ROM: Full    Dental  (+) Teeth Intact, Dental Advisory Given   Pulmonary former smoker,    breath sounds clear to auscultation       Cardiovascular  Rhythm:Regular Rate:Normal     Neuro/Psych    GI/Hepatic   Endo/Other    Renal/GU      Musculoskeletal   Abdominal   Peds  Hematology   Anesthesia Other Findings   Reproductive/Obstetrics                             Anesthesia Physical Anesthesia Plan  ASA: III  Anesthesia Plan: General   Post-op Pain Management:    Induction: Intravenous  PONV Risk Score and Plan: Ondansetron and Dexamethasone  Airway Management Planned: LMA  Additional Equipment:   Intra-op Plan:   Post-operative Plan:   Informed Consent: I have reviewed the patients History and Physical, chart, labs and discussed the procedure including the risks, benefits and alternatives for the proposed anesthesia with the patient or authorized representative who has indicated his/her understanding and acceptance.   Dental advisory given  Plan Discussed with: CRNA and Anesthesiologist  Anesthesia Plan Comments:         Anesthesia Quick Evaluation  

## 2019-09-05 NOTE — H&P (Signed)
Sean Hammond is an 78 y.o. male.    Chief Complaint: Pre-OP Cysto, Bilateral Retrogrades and RIGHT diagnostic ureteroscopy  HPI:   1 - Urinary retention - new urinary retention after elective cholecystectomy 03/2015. Started on tamsulosin at discharge. Prostate volume 44mL by ellipsoid calculation from CT 03/2015 with medain lobe. No baseline bothersome voiding symptoms whatsoever. Passed subsequetn trial of void.    2 - Left non-complex renal cyst - incidental <2cm left mid renal cyst w/o enhancement, nodules or mass-effect. Stable buy contrast CT 2019.    3 - Prostate screening - PSA 2-3 range x years. PSA 2.63 2019 at age 79 DRE 60 gm smooth ==> STOP PSA based screening.   4 - Gross Hematuria - few days of gross blood 10/2018. UA blood only. Remote 10PY smoker. No chronic solvent exposure. One transient episode left flank pain. Hematuria CT reassuring with stable non-complex renal cyst and no upper tract stones / masses. Cysto 11/2018 with some bilobar prostitc hypertrophy and friability only suggesting BPH source.   5 - Right Hydronephrosis / FLank Pain - new Rt hydro to distal ureter with some stranding by ER CT 07/2019. NO stones noted. UCX negative, Cr 1.6-2 up from normal baseline. PVR 08/2019 "136 mL" (not sig elevated)   PMH sig for CAD/MI/Stent (follows [facility], cardiology, ASA only), skin cancer. 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 His PCP is Dr. Inda Merlin with [facility].    Today " Sean Hammond" is seen to proceed with cyst, retrogrades, Rt diagnostic ureteroscopy to rule out intraluminal neoplasm. Most recent UCX negative. C19 screen negative. No interval fevers.      Past Medical History:  Diagnosis Date  . Allergic rhinitis   . BPH (benign prostatic hyperplasia)    urologist-- dr t. [name]  . CAD (coronary artery disease) cardiologsit--- dr [name]   a. 12-14-2011 s/p PCI with DES x2 to midLCFx // b. Myoview 7/15: inferior ischemia, EF 52%, low  risk  //  c. LHC 6/17: LCx stents patent, no sig CAD elsewhere, EF 65% >> Med Rx  . Chronic headaches    due to sinues  . ED (erectile dysfunction)   . GERD (gastroesophageal reflux disease)   . Hematuria   . History of acute pyelonephritis    08-11-2019  . History of echocardiogram    last one 03-22-2015   mild LVH,   ef 55-60%  . History of kidney stones   . History of melanoma excision    non-malignant excised from ear  . History of MI (myocardial infarction)    per pt remote hx yrs ago prior to 2013  . History of nonmelanoma skin cancer    per pt SCC and BCC from back, head, face  excision  . History of prostatitis   . Hydronephrosis, right   . Hyperlipidemia   . Moderate obstructive sleep apnea    not used cpap since 2014 bought bed that head would elevate   . Osteoarthritis of hand   . PONV (postoperative nausea and vomiting)   . Wears glasses     Past Surgical History:  Procedure Laterality Date  . BONE CYST EXCISION  ~ 2004   left knee  . CARDIAC CATHETERIZATION N/A 05/09/2016   Procedure: Left Heart Cath and Coronary Angiography;  Surgeon: Jerline Pain, MD;  Location: [facility];  Service: Cardiovascular;  Laterality: N/A;  . CHOLECYSTECTOMY N/A 03/23/2015   Procedure: LAPAROSCOPIC CHOLECYSTECTOMY;  Surgeon: Ralene Ok,  MD;  Location: [facility];  Service: General;  Laterality: N/A;  . CORONARY ANGIOPLASTY WITH STENT PLACEMENT  12/14/11   "2"  . ETHMOIDECTOMY Right 09/01/2019   Procedure: RIGHT REVISION ENDOSCOPIC ETHMOIDECTOMY;  Surgeon: Izora Gala, MD;  Location: [place];  Service: ENT;  Laterality: Right;  . FRONTAL SINUSOTOMY Bilateral 07-23-2003   dr Constance Holster   re-do  . KNEE ARTHROSCOPY Left ` 2010  . LEFT HEART CATHETERIZATION WITH CORONARY ANGIOGRAM N/A 12/14/2011   Procedure: LEFT HEART CATHETERIZATION WITH CORONARY ANGIOGRAM;  Surgeon: Candee Furbish, MD;  Location: [facility];  Service: Cardiovascular;  Laterality: N/A;  .  PERCUTANEOUS CORONARY STENT INTERVENTION (PCI-S)  12/14/2011   Procedure: PERCUTANEOUS CORONARY STENT INTERVENTION (PCI-S);  Surgeon: Candee Furbish, MD;  Location: [facility];  Service: Cardiovascular;;  . SEPTOPLASTY WITH ETHMOIDECTOMY, AND MAXILLARY ANTROSTOMY  09-22-2002   dr Constance Holster   and sinusotomy  . TOTAL KNEE ARTHROPLASTY Left 05/09/2017   Procedure: LEFT TOTAL KNEE ARTHROPLASTY;  Surgeon: Latanya Maudlin, MD;  Location: [facility];  Service: Orthopedics;  Laterality: Left;    Family History  Problem Relation Age of Onset  . Heart attack Mother   . Hypertension Mother    Social History:  reports that he quit smoking about 45 years ago. His smoking use included cigarettes. He has a 24.00 pack-year smoking history. He has never used smokeless tobacco. He reports previous alcohol use. He reports that he does not use drugs.  Allergies:  Allergies  Allergen Reactions  . Beef-Derived Products Anaphylaxis, Hives, Shortness Of Breath and Rash    Because of a tick bite, the patient now has "Alpha-gal allergy"  . Pork-Derived Products Anaphylaxis, Hives, Shortness Of Breath and Rash    Because of a tick bite, the patient now has "Alpha-gal allergy"  . Codeine Other (See Comments)    Surgeon advised against this  . Crestor [Rosuvastatin] Other (See Comments)    Muscle aches  . Dilaudid [Hydromorphone Hcl] Hives    All-over hives  . Hydrocodone Itching  . Lipitor [Atorvastatin] Other (See Comments)    Muscle aches   . Percocet [Oxycodone-Acetaminophen] Itching  . Pitavastatin Other (See Comments)    Leg pain    No medications prior to admission.    No results found for this or any previous visit (from the past 48 hour(s)). No results found.  Review of Systems  Genitourinary: Positive for hematuria.  All other systems reviewed and are negative.   Height 6\' 1"  (1.854 m), weight 88.9 kg. Physical Exam  Constitutional: He appears well-developed.  HENT:  Head: Normocephalic.  Eyes:  Pupils are equal, round, and reactive to light.  Neck: Normal range of motion.  Cardiovascular: Normal rate.  Respiratory: Effort normal.  GI: Soft.  Genitourinary:    Genitourinary Comments: NO CVAT at present.    Musculoskeletal: Normal range of motion.  Neurological: He is alert.  Skin: Skin is warm.  Psychiatric: He has a normal mood and affect.     Assessment/Plan  Proceed as planned with cysto, retrogrades, Rt diagnostic ureteroscopy. Risks, benefits, alternatives, expected peri-op course disucssed previously and reiterated today.   Alexis Frock, MD 09/05/2019, 7:09 AM

## 2019-09-05 NOTE — Transfer of Care (Signed)
Immediate Anesthesia Transfer of Care Note  Patient: Sean Hammond  Procedure(s) Performed: CYSTOSCOPY/ BILATERAL RETROGRADE/ RIGHT  URETEROSCOPY with biopsy with laser ablation of tumor (Bilateral Renal) HOLMIUM LASER APPLICATION (Right Renal)  Patient Location: PACU  Anesthesia Type:General  Level of Consciousness: awake, alert , oriented and patient cooperative  Airway & Oxygen Therapy: Patient Spontanous Breathing and Patient connected to nasal cannula oxygen  Post-op Assessment: Report given to RN and Post -op Vital signs reviewed and stable  Post vital signs: Reviewed and stable  Last Vitals:  Vitals Value Taken Time  BP 121/81 09/05/19 1307  Temp    Pulse 94 09/05/19 1309  Resp 16 09/05/19 1309  SpO2 98 % 09/05/19 1309  Vitals shown include unvalidated device data.  Last Pain:  Vitals:   09/05/19 0947  TempSrc: Oral  PainSc: 5       Patients Stated Pain Goal: 5 ([id number])  Complications: No apparent anesthesia complications

## 2019-09-08 ENCOUNTER — Encounter (HOSPITAL_BASED_OUTPATIENT_CLINIC_OR_DEPARTMENT_OTHER): Payer: Self-pay | Admitting: Urology

## 2019-09-08 LAB — SURGICAL PATHOLOGY

## 2019-09-18 DIAGNOSIS — R339 Retention of urine, unspecified: Secondary | ICD-10-CM | POA: Diagnosis not present

## 2019-09-18 DIAGNOSIS — C651 Malignant neoplasm of right renal pelvis: Secondary | ICD-10-CM | POA: Diagnosis not present

## 2019-09-18 DIAGNOSIS — R310 Gross hematuria: Secondary | ICD-10-CM | POA: Diagnosis not present

## 2019-09-22 ENCOUNTER — Other Ambulatory Visit: Payer: Self-pay | Admitting: Urology

## 2019-09-23 DIAGNOSIS — C44329 Squamous cell carcinoma of skin of other parts of face: Secondary | ICD-10-CM | POA: Diagnosis not present

## 2019-09-23 DIAGNOSIS — L570 Actinic keratosis: Secondary | ICD-10-CM | POA: Diagnosis not present

## 2019-09-23 DIAGNOSIS — Z23 Encounter for immunization: Secondary | ICD-10-CM | POA: Diagnosis not present

## 2019-09-23 DIAGNOSIS — C44319 Basal cell carcinoma of skin of other parts of face: Secondary | ICD-10-CM | POA: Diagnosis not present

## 2019-10-08 DIAGNOSIS — Z0001 Encounter for general adult medical examination with abnormal findings: Secondary | ICD-10-CM | POA: Diagnosis not present

## 2019-10-08 DIAGNOSIS — K5730 Diverticulosis of large intestine without perforation or abscess without bleeding: Secondary | ICD-10-CM | POA: Diagnosis not present

## 2019-10-08 DIAGNOSIS — M170 Bilateral primary osteoarthritis of knee: Secondary | ICD-10-CM | POA: Diagnosis not present

## 2019-10-08 DIAGNOSIS — Z0000 Encounter for general adult medical examination without abnormal findings: Secondary | ICD-10-CM | POA: Diagnosis not present

## 2019-10-08 DIAGNOSIS — I2510 Atherosclerotic heart disease of native coronary artery without angina pectoris: Secondary | ICD-10-CM | POA: Diagnosis not present

## 2019-10-08 DIAGNOSIS — R1084 Generalized abdominal pain: Secondary | ICD-10-CM | POA: Diagnosis not present

## 2019-10-08 DIAGNOSIS — I208 Other forms of angina pectoris: Secondary | ICD-10-CM | POA: Diagnosis not present

## 2019-10-08 DIAGNOSIS — G609 Hereditary and idiopathic neuropathy, unspecified: Secondary | ICD-10-CM | POA: Diagnosis not present

## 2019-10-08 DIAGNOSIS — E782 Mixed hyperlipidemia: Secondary | ICD-10-CM | POA: Diagnosis not present

## 2019-10-08 DIAGNOSIS — Z23 Encounter for immunization: Secondary | ICD-10-CM | POA: Diagnosis not present

## 2019-10-08 DIAGNOSIS — Z1389 Encounter for screening for other disorder: Secondary | ICD-10-CM | POA: Diagnosis not present

## 2019-10-08 DIAGNOSIS — G4733 Obstructive sleep apnea (adult) (pediatric): Secondary | ICD-10-CM | POA: Diagnosis not present

## 2019-10-17 DIAGNOSIS — J329 Chronic sinusitis, unspecified: Secondary | ICD-10-CM | POA: Diagnosis not present

## 2019-10-17 DIAGNOSIS — Z20828 Contact with and (suspected) exposure to other viral communicable diseases: Secondary | ICD-10-CM | POA: Diagnosis not present

## 2019-11-17 ENCOUNTER — Other Ambulatory Visit: Payer: Self-pay | Admitting: Cardiology

## 2019-11-18 DIAGNOSIS — R8271 Bacteriuria: Secondary | ICD-10-CM | POA: Diagnosis not present

## 2019-11-19 ENCOUNTER — Telehealth: Payer: Self-pay | Admitting: Cardiology

## 2019-11-19 NOTE — Patient Instructions (Addendum)
DUE TO COVID-19 ONLY ONE VISITOR IS ALLOWED TO COME WITH YOU AND STAY IN THE WAITING ROOM ONLY DURING PRE OP AND PROCEDURE DAY OF SURGERY. THE 1 VISITOR MAY VISIT WITH YOU AFTER SURGERY IN YOUR PRIVATE ROOM DURING VISITING HOURS ONLY!                 Sean Hammond    Your procedure is scheduled on: 11/27/18   Report to [facility] Main  Entrance   Report to Short Stay at 5:30 AM     Call this number if you have problems the morning of surgery [phone]    Remember: Do not eat food or drink liquids :After Midnight.   BRUSH YOUR TEETH MORNING OF SURGERY AND RINSE YOUR MOUTH OUT, NO CHEWING GUM CANDY OR MINTS.     Take these medicines the morning of surgery with A SIP OF WATER: Metoprolol, Protonix                                 You may not have any metal on your body including              piercings  Do not wear jewelry,  lotions, powders or  deodorant                      Men may shave face and neck.   Do not bring valuables to the hospital. [place].  Contacts, dentures or bridgework may not be worn into surgery.       Special Instructions: N/A              Please read over the following fact sheets you were given: _____________________________________________________________________             [facility] - Preparing for Surgery  Before surgery, you can play an important role.   Because skin is not sterile, your skin needs to be as free of germs as possible.   You can reduce the number of germs on your skin by washing with CHG (chlorahexidine gluconate) soap before surgery.   CHG is an antiseptic cleaner which kills germs and bonds with the skin to continue killing germs even after washing. Please DO NOT use if you have an allergy to CHG or antibacterial soaps.   If your skin becomes reddened/irritated stop using the CHG and inform your nurse when you arrive at Short Stay.   You may shave your  face/neck.  Please follow these instructions carefully:  1.  Shower with CHG Soap the night before surgery and the  morning of Surgery.  2.  If you choose to wash your hair, wash your hair first as usual with your  normal  shampoo.  3.  After you shampoo, rinse your hair and body thoroughly to remove the  shampoo.                                        4.  Use CHG as you would any other liquid soap.  You can apply chg directly  to the skin and wash  Gently with a scrungie or clean washcloth.  5.  Apply the CHG Soap to your body ONLY FROM THE NECK DOWN.   Do not use on face/ open                           Wound or open sores. Avoid contact with eyes, ears mouth and genitals (private parts).                       Wash face,  Genitals (private parts) with your normal soap.             6.  Wash thoroughly, paying special attention to the area where your surgery  will be performed.  7.  Thoroughly rinse your body with warm water from the neck down.  8.  DO NOT shower/wash with your normal soap after using and rinsing off  the CHG Soap.             9.  Pat yourself dry with a clean towel.            10.  Wear clean pajamas.            11.  Place clean sheets on your bed the night of your first shower and do not  sleep with pets. Day of Surgery : Do not apply any lotions/deodorants the morning of surgery.  Please wear clean clothes to the hospital/surgery center.  FAILURE TO FOLLOW THESE INSTRUCTIONS MAY RESULT IN THE CANCELLATION OF YOUR SURGERY PATIENT SIGNATURE_________________________________  NURSE SIGNATURE__________________________________  ________________________________________________________________________

## 2019-11-19 NOTE — Telephone Encounter (Signed)
   Primary Cardiologist: Candee Furbish, MD  Chart reviewed and patient contacted by phone today as part of pre-operative protocol coverage. Given past medical history and time since last visit, based on ACC/AHA guidelines, LUISENRIQUE SHAM would be at acceptable risk for the planned procedure without further cardiovascular testing.   OK to hold aspirin 5 days pre op if needed.  I will route this recommendation to the requesting party via Epic fax function and remove from pre-op pool.  Please call with questions.  Kerin Ransom, PA-C 11/19/2019, 3:51 PM

## 2019-11-19 NOTE — Telephone Encounter (Signed)
New Message      [facility] Pre-operative Risk Assessment    Request for surgical clearance:  1. What type of surgery is being performed? Right robotic nephroectomy  2. When is this surgery scheduled? 11/28/2019   3. What type of clearance is required (medical clearance vs. Pharmacy clearance to hold med vs. Both)? Medical  4. Are there any medications that need to be held prior to surgery and how long? N/A   5. Practice name and name of physician performing surgery? [facility], Dr. [name]   6. What is your office phone number [phone]   7.   What is your office fax number [phone]  8.   Anesthesia type (None, local, MAC, general) ? General    Sean Hammond 11/19/2019, 3:27 PM  _________________________________________________________________   (provider comments below)

## 2019-11-24 ENCOUNTER — Encounter (HOSPITAL_COMMUNITY)
Admission: RE | Admit: 2019-11-24 | Discharge: 2019-11-24 | Disposition: A | Discharge status: Home / Self Care | Payer: Medicare Other | Source: Ambulatory Visit | Attending: Urology | Admitting: Urology

## 2019-11-24 ENCOUNTER — Other Ambulatory Visit: Payer: Self-pay

## 2019-11-24 ENCOUNTER — Encounter (HOSPITAL_COMMUNITY): Payer: Self-pay

## 2019-11-24 DIAGNOSIS — I2510 Atherosclerotic heart disease of native coronary artery without angina pectoris: Secondary | ICD-10-CM | POA: Diagnosis not present

## 2019-11-24 DIAGNOSIS — Z79899 Other long term (current) drug therapy: Secondary | ICD-10-CM | POA: Insufficient documentation

## 2019-11-24 DIAGNOSIS — Z87442 Personal history of urinary calculi: Secondary | ICD-10-CM | POA: Diagnosis not present

## 2019-11-24 DIAGNOSIS — G4733 Obstructive sleep apnea (adult) (pediatric): Secondary | ICD-10-CM | POA: Diagnosis not present

## 2019-11-24 DIAGNOSIS — N400 Enlarged prostate without lower urinary tract symptoms: Secondary | ICD-10-CM | POA: Diagnosis not present

## 2019-11-24 DIAGNOSIS — Z955 Presence of coronary angioplasty implant and graft: Secondary | ICD-10-CM | POA: Insufficient documentation

## 2019-11-24 DIAGNOSIS — K219 Gastro-esophageal reflux disease without esophagitis: Secondary | ICD-10-CM | POA: Insufficient documentation

## 2019-11-24 DIAGNOSIS — E785 Hyperlipidemia, unspecified: Secondary | ICD-10-CM | POA: Diagnosis not present

## 2019-11-24 DIAGNOSIS — I252 Old myocardial infarction: Secondary | ICD-10-CM | POA: Insufficient documentation

## 2019-11-24 DIAGNOSIS — Z96652 Presence of left artificial knee joint: Secondary | ICD-10-CM | POA: Diagnosis not present

## 2019-11-24 DIAGNOSIS — C651 Malignant neoplasm of right renal pelvis: Secondary | ICD-10-CM | POA: Insufficient documentation

## 2019-11-24 DIAGNOSIS — Z85820 Personal history of malignant melanoma of skin: Secondary | ICD-10-CM | POA: Insufficient documentation

## 2019-11-24 DIAGNOSIS — Z7982 Long term (current) use of aspirin: Secondary | ICD-10-CM | POA: Diagnosis not present

## 2019-11-24 DIAGNOSIS — Z01818 Encounter for other preprocedural examination: Secondary | ICD-10-CM | POA: Insufficient documentation

## 2019-11-24 DIAGNOSIS — Z87891 Personal history of nicotine dependence: Secondary | ICD-10-CM | POA: Insufficient documentation

## 2019-11-24 LAB — CBC
HCT: 45.5 % (ref 39.0–52.0)
Hemoglobin: 15.1 g/dL (ref 13.0–17.0)
MCH: 31.2 pg (ref 26.0–34.0)
MCHC: 33.2 g/dL (ref 30.0–36.0)
MCV: 94 fL (ref 80.0–100.0)
Platelets: 219 10*3/uL (ref 150–400)
RBC: 4.84 MIL/uL (ref 4.22–5.81)
RDW: 13.3 % (ref 11.5–15.5)
WBC: 5.9 10*3/uL (ref 4.0–10.5)
nRBC: 0 % (ref 0.0–0.2)

## 2019-11-24 LAB — BASIC METABOLIC PANEL
Anion gap: 5 (ref 5–15)
BUN: 24 mg/dL — ABNORMAL HIGH (ref 8–23)
CO2: 27 mmol/L (ref 22–32)
Calcium: 9.4 mg/dL (ref 8.9–10.3)
Chloride: 110 mmol/L (ref 98–111)
Creatinine, Ser: 1.36 mg/dL — ABNORMAL HIGH (ref 0.61–1.24)
GFR calc Af Amer: 58 mL/min — ABNORMAL LOW (ref >60)
GFR calc non Af Amer: 50 mL/min — ABNORMAL LOW (ref >60)
Glucose, Bld: 89 mg/dL (ref 70–99)
Potassium: 4.8 mmol/L (ref 3.5–5.1)
Sodium: 142 mmol/L (ref 135–145)

## 2019-11-24 NOTE — Progress Notes (Signed)
PCP - Dr. Brayton Layman Cardiologist - Dr. Derl Barrow  Chest x-ray - 05/14/18 EKG - 08/13/19 Stress Test -  ECHO - 2016 Cardiac Cath - 2017  Sleep Study - yes CPAP - no  Fasting Blood Sugar - NA Checks Blood Sugar _____ times a day  Blood Thinner Instructions:ASA Aspirin Instructions:hold 5 days prior Last Dose:11/20/19  Anesthesia review:   Patient denies shortness of breath, fever, cough and chest pain at PAT appointment yes  Patient verbalized understanding of instructions that were given to them at the PAT appointment. Patient was also instructed that they will need to review over the PAT instructions again at home before surgery. yes

## 2019-11-26 NOTE — Anesthesia Preprocedure Evaluation (Addendum)
Anesthesia Evaluation  Patient identified by MRN, date of birth, ID band Patient awake    Reviewed: Allergy & Precautions, NPO status , Patient's Chart, lab work & pertinent test results  History of Anesthesia Complications (+) PONV  Airway Mallampati: I  TM Distance: >3 FB Neck ROM: Full    Dental  (+) Caps, Dental Advisory Given   Pulmonary sleep apnea (does not use CPAP) , former smoker,    breath sounds clear to auscultation       Cardiovascular hypertension, Pt. on medications and Pt. on home beta blockers (-) angina+ CAD and + Cardiac Stents   Rhythm:Regular Rate:Normal  '17 cath: Widely patent circumflex stents previously placed, No other angiographically significant coronary artery disease present, Normal left ventriculogram, no mitral regurgitation, no aortic stenosis, EF 65%  '16 ECHO:  mild LVH, EF 55-60%   Neuro/Psych negative neurological ROS     GI/Hepatic Neg liver ROS, GERD  Medicated and Controlled,  Endo/Other  negative endocrine ROS  Renal/GU Renal InsufficiencyRenal disease (creat 1.36)     Musculoskeletal   Abdominal   Peds  Hematology negative hematology ROS (+)   Anesthesia Other Findings   Reproductive/Obstetrics                           Anesthesia Physical Anesthesia Plan  ASA: III  Anesthesia Plan: General   Post-op Pain Management:    Induction: Intravenous  PONV Risk Score and Plan: 4 or greater and Dexamethasone, Ondansetron and Treatment may vary due to age or medical condition  Airway Management Planned: Oral ETT  Additional Equipment:   Intra-op Plan:   Post-operative Plan: Extubation in OR  Informed Consent: I have reviewed the patients History and Physical, chart, labs and discussed the procedure including the risks, benefits and alternatives for the proposed anesthesia with the patient or authorized representative who has indicated his/her  understanding and acceptance.     Dental advisory given  Plan Discussed with: CRNA and Surgeon  Anesthesia Plan Comments: (See PAT note 11/24/2019, Konrad Felix, PA-C)      Anesthesia Quick Evaluation

## 2019-11-26 NOTE — Progress Notes (Signed)
Anesthesia Chart Review   Case: [id number] Date/Time: 11/28/19 0700   Procedure: XI ROBOT ASSITED LAPAROSCOPIC NEPHROURETERECTOMY (Right ) - 3 HRS   Anesthesia type: General   Pre-op diagnosis: RIGHT RENAL PELVIS CANCER   [facility] ROOM 03 / [facility]   Surgeons: Alexis Frock, MD      DISCUSSION:79 y.o. former smoker (24 pack years, quit 09/03/74) with h/o PONV, GERD, HLD, CAD (MI 2013, DES to mid LCFx), sleep apnea, BPH, right renal pelvis cancer scheduled for above procedure 11/28/2019 with Dr. Alexis Frock.   Cleared by cardiology.  Per Kerin Ransom, PA-C, "Chart reviewed and patient contacted by phone today as part of pre-operative protocol coverage. Given past medical history and time since last visit, based on ACC/AHA guidelines, MARCIN MCELHENNEY would be at acceptable risk for the planned procedure without further cardiovascular testing. OK to hold aspirin 5 days pre op if needed."  Anticipate pt can proceed with planned procedure barring acute status change.   VS: BP 131/84   Pulse 82   Temp 36.9 C (Oral)   Resp 16   Ht 6\' 1"  (1.854 m)   Wt 89.4 kg   SpO2 100%   BMI 25.99 kg/m   PROVIDERS: Josetta Huddle, MD is PCP   Candee Furbish, MD is Cardiologist  LABS: Labs reviewed: Acceptable for surgery. (all labs ordered are listed, but only abnormal results are displayed)  Labs Reviewed  BASIC METABOLIC PANEL - Abnormal; Notable for the following components:      Result Value   BUN 24 (*)    Creatinine, Ser 1.36 (*)    GFR calc non Af Amer 50 (*)    GFR calc Af Amer 58 (*)    All other components within normal limits  CBC     IMAGES:   EKG: 08/13/2019 Rate 68 bpm  Normal sinus rhythm   CV: Cardiac Cath 05/09/2016  Widely patent circumflex stents previously placed  No other angiographically significant coronary artery disease present.  Normal left ventriculogram, no mitral regurgitation, no aortic stenosis, EF 65%   Candee Furbish, MD Continue with medical  therapy, reassurance  Echo 03/22/2015 Study Conclusions  - Left ventricle: The cavity size was normal. Wall thickness was   increased in a pattern of mild LVH. Systolic function was normal.   The estimated ejection fraction was in the range of 55% to 60%.   Wall motion was normal; there were no regional wall motion   abnormalities. Left ventricular diastolic function parameters   were normal. - Pulmonary arteries: PA peak pressure: 33 mm Hg (S).  Myocardial Perfusion 06/17/2014 Mild inferior wall ischemia. Overall low risk. Can continue to proceed with medical therapy, exercise. If symptoms progress, we can proceed with heart cath. Please have him come back in 2 months Past Medical History:  Diagnosis Date  . Allergic rhinitis   . BPH (benign prostatic hyperplasia)    urologist-- dr t. [name]  . CAD (coronary artery disease) cardiologsit--- dr [name]   a. 12-14-2011 s/p PCI with DES x2 to midLCFx // b. Myoview 7/15: inferior ischemia, EF 52%, low risk  //  c. LHC 6/17: LCx stents patent, no sig CAD elsewhere, EF 65% >> Med Rx  . Chronic headaches    due to sinues  . ED (erectile dysfunction)   . GERD (gastroesophageal reflux disease)   . Hematuria   . History of acute pyelonephritis    08-11-2019  . History of echocardiogram    last one 03-22-2015  mild LVH,   ef 55-60%  . History of kidney stones   . History of melanoma excision    non-malignant excised from ear  . History of MI (myocardial infarction)    per pt remote hx yrs ago prior to 2013  . History of nonmelanoma skin cancer    per pt SCC and BCC from back, head, face  excision  . History of prostatitis   . Hydronephrosis, right   . Hyperlipidemia   . Moderate obstructive sleep apnea    not used cpap since 2014 bought bed that head would elevate   . Osteoarthritis of hand   . PONV (postoperative nausea and vomiting)    Pt may have been confused after surgery in the past  . Wears glasses     Past Surgical  History:  Procedure Laterality Date  . BONE CYST EXCISION  ~ 2004   left knee  . CARDIAC CATHETERIZATION N/A 05/09/2016   Procedure: Left Heart Cath and Coronary Angiography;  Surgeon: Jerline Pain, MD;  Location: [facility];  Service: Cardiovascular;  Laterality: N/A;  . CHOLECYSTECTOMY N/A 03/23/2015   Procedure: LAPAROSCOPIC CHOLECYSTECTOMY;  Surgeon: Ralene Ok, MD;  Location[facility];  Service: General;  Laterality: N/A;  . CORONARY ANGIOPLASTY WITH STENT PLACEMENT  12/14/11   "2"  . CYSTOSCOPY/RETROGRADE/URETEROSCOPY Bilateral 09/05/2019   Procedure: CYSTOSCOPY/ BILATERAL RETROGRADE/ RIGHT  URETEROSCOPY with biopsy with laser ablation of tumor;  Surgeon: Alexis Frock, MD;  Location: [facility];  Service: Urology;  Laterality: Bilateral;  . ETHMOIDECTOMY Right 09/01/2019   Procedure: RIGHT REVISION ENDOSCOPIC ETHMOIDECTOMY;  Surgeon: Izora Gala, MD;  Location: [place];  Service: ENT;  Laterality: Right;  . FRONTAL SINUSOTOMY Bilateral 07-23-2003   dr Constance Holster   re-do  . HOLMIUM LASER APPLICATION Right [id number]   Procedure: HOLMIUM LASER APPLICATION;  Surgeon: Alexis Frock, MD;  Location: [facility];  Service: Urology;  Laterality: Right;  . KNEE ARTHROSCOPY Left ` 2010  . LEFT HEART CATHETERIZATION WITH CORONARY ANGIOGRAM N/A 12/14/2011   Procedure: LEFT HEART CATHETERIZATION WITH CORONARY ANGIOGRAM;  Surgeon: Candee Furbish, MD;  Location: [facility] C[facility] LAB;  Service: Cardiovascular;  Laterality: N/A;  . PERCUTANEOUS CORONARY STENT INTERVENTION (PCI-S)  12/14/2011   Procedure: PERCUTANEOUS CORONARY STENT INTERVENTION (PCI-S);  Surgeon: Candee Furbish, MD;  Location: [facility] CATH LAB;  Service: Cardiovascular;;  . SEPTOPLASTY WITH ETHMOIDECTOMY, AND MAXILLARY ANTROSTOMY  09-22-2002   dr Constance Holster   and sinusotomy  . TOTAL KNEE ARTHROPLASTY Left 05/09/2017   Procedure: LEFT TOTAL KNEE ARTHROPLASTY;  Surgeon: Latanya Maudlin, MD;  [facility];  Service: Orthopedics;  Laterality: Left;    MEDICATIONS: . aspirin 81 MG chewable tablet  . Cholecalciferol (VITAMIN D HIGH POTENCY) 1000 UNITS capsule  . diphenhydrAMINE (BENADRYL) 25 MG tablet  . EPIPEN 2-PAK 0.3 MG/0.3ML SOAJ injection  . HYDROcodone-acetaminophen (NORCO) 7.5-325 MG tablet  . metoprolol succinate (TOPROL-XL) 25 MG 24 hr tablet  . nitroGLYCERIN (NITROSTAT) 0.4 MG SL tablet  . pantoprazole (PROTONIX) 40 MG tablet  . PREVIDENT 5000 SENSITIVE 1.1-5 % PSTE  . promethazine (PHENERGAN) 25 MG suppository  . tamsulosin (FLOMAX) 0.4 MG CAPS capsule   No current facility-administered medications for this encounter.    Maia Plan [facility] Pre-Surgical Testing [phone] 11/26/19  1:46 PM

## 2019-11-28 ENCOUNTER — Encounter (HOSPITAL_COMMUNITY)
Admission: RE | Disposition: A | Discharge status: Home / Self Care | Payer: Self-pay | Source: Other Acute Inpatient Hospital | Attending: Urology

## 2019-11-28 ENCOUNTER — Inpatient Hospital Stay (HOSPITAL_COMMUNITY)
Admission: RE | Admit: 2019-11-28 | Discharge: 2019-11-30 | DRG: 658 | Disposition: A | Discharge status: Home / Self Care | Payer: Medicare Other | Source: Other Acute Inpatient Hospital | Attending: Urology | Admitting: Urology

## 2019-11-28 ENCOUNTER — Other Ambulatory Visit: Payer: Self-pay

## 2019-11-28 ENCOUNTER — Inpatient Hospital Stay (HOSPITAL_COMMUNITY): Payer: Medicare Other | Admitting: Physician Assistant

## 2019-11-28 ENCOUNTER — Encounter (HOSPITAL_COMMUNITY): Payer: Self-pay | Admitting: Urology

## 2019-11-28 ENCOUNTER — Inpatient Hospital Stay (HOSPITAL_COMMUNITY): Payer: Medicare Other | Admitting: Anesthesiology

## 2019-11-28 DIAGNOSIS — Z85820 Personal history of malignant melanoma of skin: Secondary | ICD-10-CM

## 2019-11-28 DIAGNOSIS — Z955 Presence of coronary angioplasty implant and graft: Secondary | ICD-10-CM | POA: Diagnosis not present

## 2019-11-28 DIAGNOSIS — R3915 Urgency of urination: Secondary | ICD-10-CM | POA: Diagnosis present

## 2019-11-28 DIAGNOSIS — Z20822 Contact with and (suspected) exposure to covid-19: Secondary | ICD-10-CM | POA: Diagnosis present

## 2019-11-28 DIAGNOSIS — Z7982 Long term (current) use of aspirin: Secondary | ICD-10-CM | POA: Diagnosis not present

## 2019-11-28 DIAGNOSIS — Z8249 Family history of ischemic heart disease and other diseases of the circulatory system: Secondary | ICD-10-CM | POA: Diagnosis not present

## 2019-11-28 DIAGNOSIS — I2510 Atherosclerotic heart disease of native coronary artery without angina pectoris: Secondary | ICD-10-CM | POA: Diagnosis present

## 2019-11-28 DIAGNOSIS — R319 Hematuria, unspecified: Secondary | ICD-10-CM | POA: Diagnosis present

## 2019-11-28 DIAGNOSIS — N401 Enlarged prostate with lower urinary tract symptoms: Secondary | ICD-10-CM | POA: Diagnosis present

## 2019-11-28 DIAGNOSIS — R338 Other retention of urine: Secondary | ICD-10-CM | POA: Diagnosis not present

## 2019-11-28 DIAGNOSIS — Z79899 Other long term (current) drug therapy: Secondary | ICD-10-CM | POA: Diagnosis not present

## 2019-11-28 DIAGNOSIS — Z87891 Personal history of nicotine dependence: Secondary | ICD-10-CM | POA: Diagnosis not present

## 2019-11-28 DIAGNOSIS — I10 Essential (primary) hypertension: Secondary | ICD-10-CM | POA: Diagnosis present

## 2019-11-28 DIAGNOSIS — Z96652 Presence of left artificial knee joint: Secondary | ICD-10-CM | POA: Diagnosis present

## 2019-11-28 DIAGNOSIS — E785 Hyperlipidemia, unspecified: Secondary | ICD-10-CM | POA: Diagnosis not present

## 2019-11-28 DIAGNOSIS — I252 Old myocardial infarction: Secondary | ICD-10-CM

## 2019-11-28 DIAGNOSIS — C651 Malignant neoplasm of right renal pelvis: Principal | ICD-10-CM | POA: Diagnosis present

## 2019-11-28 DIAGNOSIS — Z85828 Personal history of other malignant neoplasm of skin: Secondary | ICD-10-CM | POA: Diagnosis not present

## 2019-11-28 DIAGNOSIS — D0919 Carcinoma in situ of other urinary organs: Secondary | ICD-10-CM | POA: Diagnosis not present

## 2019-11-28 DIAGNOSIS — K219 Gastro-esophageal reflux disease without esophagitis: Secondary | ICD-10-CM | POA: Diagnosis present

## 2019-11-28 DIAGNOSIS — N2889 Other specified disorders of kidney and ureter: Secondary | ICD-10-CM | POA: Diagnosis present

## 2019-11-28 HISTORY — PX: ROBOT ASSITED LAPAROSCOPIC NEPHROURETERECTOMY: SHX6077

## 2019-11-28 LAB — TYPE AND SCREEN
ABO/RH(D): O POS
Antibody Screen: NEGATIVE

## 2019-11-28 LAB — HEMOGLOBIN AND HEMATOCRIT, BLOOD
HCT: 42.6 % (ref 39.0–52.0)
Hemoglobin: 13.9 g/dL (ref 13.0–17.0)

## 2019-11-28 SURGERY — NEPHROURETERECTOMY, ROBOT-ASSISTED, LAPAROSCOPIC
Anesthesia: General | Laterality: Right

## 2019-11-28 MED ORDER — TRAMADOL HCL 50 MG PO TABS
50.0000 mg | ORAL_TABLET | Freq: Four times a day (QID) | ORAL | Status: DC | PRN
  Administered 2019-11-29: 22:00:00 50 mg via ORAL
  Filled 2019-11-28 (×2): qty 1

## 2019-11-28 MED ORDER — TAMSULOSIN HCL 0.4 MG PO CAPS
0.4000 mg | ORAL_CAPSULE | Freq: Every day | ORAL | Status: DC
  Administered 2019-11-28 – 2019-11-30 (×3): 0.4 mg via ORAL
  Filled 2019-11-28 (×3): qty 1

## 2019-11-28 MED ORDER — SODIUM CHLORIDE (PF) 0.9 % IJ SOLN
INTRAMUSCULAR | Status: AC
  Filled 2019-11-28: qty 20

## 2019-11-28 MED ORDER — CHLORHEXIDINE GLUCONATE CLOTH 2 % EX PADS
6.0000 | MEDICATED_PAD | Freq: Every day | CUTANEOUS | Status: DC
  Administered 2019-11-29 – 2019-11-30 (×2): 6 via TOPICAL

## 2019-11-28 MED ORDER — MIDAZOLAM HCL 2 MG/2ML IJ SOLN: 0.5000 mg | Freq: Once | INTRAMUSCULAR | Status: DC | PRN

## 2019-11-28 MED ORDER — LACTATED RINGERS IV SOLN: INTRAVENOUS | Status: DC

## 2019-11-28 MED ORDER — BELLADONNA ALKALOIDS-OPIUM 16.2-60 MG RE SUPP
1.0000 | Freq: Four times a day (QID) | RECTAL | Status: DC | PRN
  Administered 2019-11-28: 15:00:00 1 via RECTAL
  Filled 2019-11-28: qty 1

## 2019-11-28 MED ORDER — DIPHENHYDRAMINE HCL 50 MG/ML IJ SOLN: 12.5000 mg | Freq: Four times a day (QID) | INTRAMUSCULAR | Status: DC | PRN

## 2019-11-28 MED ORDER — PHENYLEPHRINE HCL (PRESSORS) 10 MG/ML IV SOLN
INTRAVENOUS | Status: AC
  Filled 2019-11-28: qty 3

## 2019-11-28 MED ORDER — FENTANYL CITRATE (PF) 250 MCG/5ML IJ SOLN
INTRAMUSCULAR | Status: AC
  Filled 2019-11-28: qty 5

## 2019-11-28 MED ORDER — ONDANSETRON HCL 4 MG/2ML IJ SOLN
INTRAMUSCULAR | Status: DC | PRN
  Administered 2019-11-28 (×2): 4 mg via INTRAVENOUS

## 2019-11-28 MED ORDER — FENTANYL CITRATE (PF) 250 MCG/5ML IJ SOLN
INTRAMUSCULAR | Status: DC | PRN
  Administered 2019-11-28 (×10): 50 ug via INTRAVENOUS

## 2019-11-28 MED ORDER — HYDROMORPHONE HCL 1 MG/ML IJ SOLN
1.0000 mg | INTRAMUSCULAR | Status: DC | PRN
  Administered 2019-11-28 – 2019-11-30 (×6): 1 mg via INTRAVENOUS
  Filled 2019-11-28 (×6): qty 1

## 2019-11-28 MED ORDER — LIDOCAINE 2% (20 MG/ML) 5 ML SYRINGE
INTRAMUSCULAR | Status: AC
  Filled 2019-11-28: qty 5

## 2019-11-28 MED ORDER — HYDROMORPHONE HCL 1 MG/ML IJ SOLN: 0.2500 mg | INTRAMUSCULAR | Status: DC | PRN

## 2019-11-28 MED ORDER — DOCUSATE SODIUM 50 MG/5ML PO LIQD
100.0000 mg | Freq: Two times a day (BID) | ORAL | Status: DC
  Administered 2019-11-28 – 2019-11-30 (×3): 100 mg via ORAL
  Filled 2019-11-28 (×5): qty 10

## 2019-11-28 MED ORDER — DEXAMETHASONE SODIUM PHOSPHATE 10 MG/ML IJ SOLN
INTRAMUSCULAR | Status: DC | PRN
  Administered 2019-11-28: 10 mg via INTRAVENOUS

## 2019-11-28 MED ORDER — ROCURONIUM BROMIDE 10 MG/ML (PF) SYRINGE
PREFILLED_SYRINGE | INTRAVENOUS | Status: AC
  Filled 2019-11-28: qty 10

## 2019-11-28 MED ORDER — BACITRACIN-NEOMYCIN-POLYMYXIN 400-5-5000 EX OINT: 1.0000 "application " | TOPICAL_OINTMENT | Freq: Three times a day (TID) | CUTANEOUS | Status: DC | PRN

## 2019-11-28 MED ORDER — LIDOCAINE 2% (20 MG/ML) 5 ML SYRINGE
INTRAMUSCULAR | Status: DC | PRN
  Administered 2019-11-28: 30 mg via INTRAVENOUS
  Administered 2019-11-28: 60 mg via INTRAVENOUS

## 2019-11-28 MED ORDER — FENTANYL CITRATE (PF) 100 MCG/2ML IJ SOLN
INTRAMUSCULAR | Status: DC | PRN
  Administered 2019-11-28 (×2): 50 ug via INTRAVENOUS

## 2019-11-28 MED ORDER — METOPROLOL SUCCINATE ER 25 MG PO TB24
25.0000 mg | ORAL_TABLET | Freq: Every day | ORAL | Status: DC
  Administered 2019-11-29 – 2019-11-30 (×2): 25 mg via ORAL
  Filled 2019-11-28 (×2): qty 1

## 2019-11-28 MED ORDER — SODIUM CHLORIDE (PF) 0.9 % IJ SOLN
INTRAMUSCULAR | Status: DC | PRN
  Administered 2019-11-28: 20 mL

## 2019-11-28 MED ORDER — FENTANYL CITRATE (PF) 100 MCG/2ML IJ SOLN
INTRAMUSCULAR | Status: AC
  Filled 2019-11-28: qty 2

## 2019-11-28 MED ORDER — ONDANSETRON HCL 4 MG/2ML IJ SOLN
4.0000 mg | INTRAMUSCULAR | Status: DC | PRN
  Administered 2019-11-28 – 2019-11-30 (×2): 4 mg via INTRAVENOUS
  Filled 2019-11-28: qty 2

## 2019-11-28 MED ORDER — PHENYLEPHRINE HCL-NACL 10-0.9 MG/250ML-% IV SOLN
INTRAVENOUS | Status: DC | PRN
  Administered 2019-11-28: 25 ug/min via INTRAVENOUS

## 2019-11-28 MED ORDER — ONDANSETRON HCL 4 MG/2ML IJ SOLN
INTRAMUSCULAR | Status: AC
  Filled 2019-11-28: qty 2

## 2019-11-28 MED ORDER — SUCCINYLCHOLINE CHLORIDE 200 MG/10ML IV SOSY
PREFILLED_SYRINGE | INTRAVENOUS | Status: AC
  Filled 2019-11-28: qty 10

## 2019-11-28 MED ORDER — PHENYLEPHRINE 40 MCG/ML (10ML) SYRINGE FOR IV PUSH (FOR BLOOD PRESSURE SUPPORT)
PREFILLED_SYRINGE | INTRAVENOUS | Status: AC
  Filled 2019-11-28: qty 10

## 2019-11-28 MED ORDER — FENTANYL CITRATE (PF) 100 MCG/2ML IJ SOLN
INTRAMUSCULAR | Status: AC
  Filled 2019-11-28: qty 4

## 2019-11-28 MED ORDER — PROMETHAZINE HCL 25 MG/ML IJ SOLN
6.2500 mg | INTRAMUSCULAR | Status: DC | PRN
  Administered 2019-11-28: 11:00:00 6.25 mg via INTRAVENOUS

## 2019-11-28 MED ORDER — BUPIVACAINE LIPOSOME 1.3 % IJ SUSP
20.0000 mL | Freq: Once | INTRAMUSCULAR | Status: AC
  Administered 2019-11-28: 20 mL
  Filled 2019-11-28: qty 20

## 2019-11-28 MED ORDER — EPHEDRINE 5 MG/ML INJ
INTRAVENOUS | Status: AC
  Filled 2019-11-28: qty 10

## 2019-11-28 MED ORDER — DOCUSATE SODIUM 100 MG PO CAPS: 100.0000 mg | ORAL_CAPSULE | Freq: Two times a day (BID) | ORAL | Status: DC

## 2019-11-28 MED ORDER — CEFAZOLIN SODIUM-DEXTROSE 2-4 GM/100ML-% IV SOLN
2.0000 g | INTRAVENOUS | Status: AC
  Administered 2019-11-28: 2 g via INTRAVENOUS

## 2019-11-28 MED ORDER — STERILE WATER FOR IRRIGATION IR SOLN
Status: DC | PRN
  Administered 2019-11-28: 1000 mL

## 2019-11-28 MED ORDER — PROMETHAZINE HCL 25 MG/ML IJ SOLN
INTRAMUSCULAR | Status: AC
  Filled 2019-11-28: qty 1

## 2019-11-28 MED ORDER — DEXTROSE-NACL 5-0.45 % IV SOLN: INTRAVENOUS | Status: DC

## 2019-11-28 MED ORDER — SUGAMMADEX SODIUM 500 MG/5ML IV SOLN
INTRAVENOUS | Status: DC | PRN
  Administered 2019-11-28: 350 mg via INTRAVENOUS

## 2019-11-28 MED ORDER — FENTANYL CITRATE (PF) 100 MCG/2ML IJ SOLN
50.0000 ug | INTRAMUSCULAR | Status: DC | PRN
  Administered 2019-11-28 (×3): 25 ug via INTRAVENOUS
  Administered 2019-11-28: 12:00:00 50 ug via INTRAVENOUS

## 2019-11-28 MED ORDER — PROPOFOL 10 MG/ML IV BOLUS
INTRAVENOUS | Status: AC
  Filled 2019-11-28: qty 20

## 2019-11-28 MED ORDER — HYDRALAZINE HCL 10 MG PO TABS: 10.0000 mg | ORAL_TABLET | Freq: Four times a day (QID) | ORAL | Status: DC | PRN

## 2019-11-28 MED ORDER — LACTATED RINGERS IR SOLN
Status: DC | PRN
  Administered 2019-11-28: 1

## 2019-11-28 MED ORDER — MIDAZOLAM HCL 2 MG/2ML IJ SOLN
INTRAMUSCULAR | Status: DC | PRN
  Administered 2019-11-28: 1 mg via INTRAVENOUS

## 2019-11-28 MED ORDER — LACTATED RINGERS IV SOLN: INTRAVENOUS | Status: DC | PRN

## 2019-11-28 MED ORDER — DIPHENHYDRAMINE HCL 12.5 MG/5ML PO ELIX: 12.5000 mg | ORAL_SOLUTION | Freq: Four times a day (QID) | ORAL | Status: DC | PRN

## 2019-11-28 MED ORDER — ROCURONIUM BROMIDE 10 MG/ML (PF) SYRINGE
PREFILLED_SYRINGE | INTRAVENOUS | Status: DC | PRN
  Administered 2019-11-28: 60 mg via INTRAVENOUS
  Administered 2019-11-28: 30 mg via INTRAVENOUS
  Administered 2019-11-28: 10 mg via INTRAVENOUS

## 2019-11-28 MED ORDER — MIDAZOLAM HCL 2 MG/2ML IJ SOLN
INTRAMUSCULAR | Status: AC
  Filled 2019-11-28: qty 2

## 2019-11-28 MED ORDER — MAGNESIUM CITRATE PO SOLN: 1.0000 | Freq: Once | ORAL | Status: DC

## 2019-11-28 MED ORDER — ACETAMINOPHEN 10 MG/ML IV SOLN
1000.0000 mg | Freq: Four times a day (QID) | INTRAVENOUS | Status: AC
  Administered 2019-11-28 – 2019-11-29 (×4): 1000 mg via INTRAVENOUS
  Filled 2019-11-28 (×4): qty 100

## 2019-11-28 MED ORDER — HYDROMORPHONE HCL 2 MG PO TABS: 2.0000 mg | ORAL_TABLET | ORAL | 0 refills | Status: DC | PRN

## 2019-11-28 MED ORDER — PROPOFOL 10 MG/ML IV BOLUS
INTRAVENOUS | Status: DC | PRN
  Administered 2019-11-28: 120 mg via INTRAVENOUS

## 2019-11-28 MED ORDER — MEPERIDINE HCL 50 MG/ML IJ SOLN: 6.2500 mg | INTRAMUSCULAR | Status: DC | PRN

## 2019-11-28 MED ORDER — NITROGLYCERIN 0.4 MG SL SUBL: 0.4000 mg | SUBLINGUAL_TABLET | SUBLINGUAL | Status: DC | PRN

## 2019-11-28 MED ORDER — CEFAZOLIN SODIUM-DEXTROSE 2-4 GM/100ML-% IV SOLN
INTRAVENOUS | Status: AC
  Filled 2019-11-28: qty 100

## 2019-11-28 MED ORDER — PANTOPRAZOLE SODIUM 40 MG PO TBEC
40.0000 mg | DELAYED_RELEASE_TABLET | Freq: Every day | ORAL | Status: DC
  Administered 2019-11-29 – 2019-11-30 (×2): 40 mg via ORAL
  Filled 2019-11-28 (×2): qty 1

## 2019-11-28 SURGICAL SUPPLY — 73 items
ADH SKN CLS APL DERMABOND .7 (GAUZE/BANDAGES/DRESSINGS) ×1
APL PRP STRL LF DISP 70% ISPRP (MISCELLANEOUS) ×1
BAG LAPAROSCOPIC 12 15 PORT 16 (BASKET) ×1 IMPLANT
BAG RETRIEVAL 12/15 (BASKET) ×2
BAG RETRIEVAL 12/15MM (BASKET) ×1
CHLORAPREP W/TINT 26 (MISCELLANEOUS) ×3 IMPLANT
CLIP VESOLOCK LG 6/CT PURPLE (CLIP) ×3 IMPLANT
CLIP VESOLOCK MED LG 6/CT (CLIP) ×5 IMPLANT
CLIP VESOLOCK XL 6/CT (CLIP) ×4 IMPLANT
COVER SURGICAL LIGHT HANDLE (MISCELLANEOUS) ×3 IMPLANT
COVER TIP SHEARS 8 DVNC (MISCELLANEOUS) ×1 IMPLANT
COVER TIP SHEARS 8MM DA VINCI (MISCELLANEOUS) ×2
COVER WAND RF STERILE (DRAPES) IMPLANT
CUTTER ECHEON FLEX ENDO 45 340 (ENDOMECHANICALS) ×2 IMPLANT
DECANTER SPIKE VIAL GLASS SM (MISCELLANEOUS) ×3 IMPLANT
DERMABOND ADVANCED (GAUZE/BANDAGES/DRESSINGS) ×2
DERMABOND ADVANCED .7 DNX12 (GAUZE/BANDAGES/DRESSINGS) ×2 IMPLANT
DRAIN CHANNEL 15F RND FF 3/16 (WOUND CARE) IMPLANT
DRAPE ARM DVNC X/XI (DISPOSABLE) ×4 IMPLANT
DRAPE COLUMN DVNC XI (DISPOSABLE) ×1 IMPLANT
DRAPE DA VINCI XI ARM (DISPOSABLE) ×8
DRAPE DA VINCI XI COLUMN (DISPOSABLE) ×2
DRAPE INCISE IOBAN 66X45 STRL (DRAPES) ×3 IMPLANT
DRAPE LAPAROSCOPIC ABDOMINAL (DRAPES) ×3 IMPLANT
DRAPE SHEET LG 3/4 BI-LAMINATE (DRAPES) ×3 IMPLANT
DRSG TEGADERM 4X4.75 (GAUZE/BANDAGES/DRESSINGS) ×2 IMPLANT
ELECT REM PT RETURN 15FT ADLT (MISCELLANEOUS) ×3 IMPLANT
EVACUATOR SILICONE 100CC (DRAIN) IMPLANT
GAUZE SPONGE 2X2 8PLY STRL LF (GAUZE/BANDAGES/DRESSINGS) IMPLANT
GLOVE BIO SURGEON STRL SZ 6.5 (GLOVE) ×2 IMPLANT
GLOVE BIO SURGEONS STRL SZ 6.5 (GLOVE) ×1
GLOVE BIOGEL M STRL SZ7.5 (GLOVE) ×6 IMPLANT
GOWN STRL REUS W/TWL LRG LVL3 (GOWN DISPOSABLE) ×9 IMPLANT
IRRIG SUCT STRYKERFLOW 2 WTIP (MISCELLANEOUS) ×3
IRRIGATION SUCT STRKRFLW 2 WTP (MISCELLANEOUS) ×1 IMPLANT
KIT BASIN OR (CUSTOM PROCEDURE TRAY) ×3 IMPLANT
KIT TURNOVER KIT A (KITS) IMPLANT
MARKER SKIN DUAL TIP RULER LAB (MISCELLANEOUS) ×3 IMPLANT
NDL INSUFFLATION 14GA 120MM (NEEDLE) ×1 IMPLANT
NEEDLE INSUFFLATION 14GA 120MM (NEEDLE) ×3 IMPLANT
NS IRRIG 1000ML POUR BTL (IV SOLUTION) ×3 IMPLANT
PENCIL SMOKE EVACUATOR (MISCELLANEOUS) IMPLANT
PORT ACCESS TROCAR AIRSEAL 12 (TROCAR) ×1 IMPLANT
PORT ACCESS TROCAR AIRSEAL 5M (TROCAR) ×4
PROTECTOR NERVE ULNAR (MISCELLANEOUS) ×6 IMPLANT
RELOAD STAPLE 45 2.6 WHT THIN (STAPLE) IMPLANT
RELOAD STAPLE 60 2.6 WHT THN (STAPLE) IMPLANT
RELOAD STAPLER WHITE 60MM (STAPLE) IMPLANT
SEAL CANN UNIV 5-8 DVNC XI (MISCELLANEOUS) ×4 IMPLANT
SEAL XI 5MM-8MM UNIVERSAL (MISCELLANEOUS) ×8
SET BI-LUMEN FLTR TB AIRSEAL (TUBING) IMPLANT
SET TRI-LUMEN FLTR TB AIRSEAL (TUBING) ×3 IMPLANT
SOLUTION ELECTROLUBE (MISCELLANEOUS) ×3 IMPLANT
SPONGE GAUZE 2X2 STER 10/PKG (GAUZE/BANDAGES/DRESSINGS) ×2
STAPLE ECHEON FLEX 60 POW ENDO (STAPLE) IMPLANT
STAPLE RELOAD 45 WHT (STAPLE) ×7 IMPLANT
STAPLE RELOAD 45MM WHITE (STAPLE) ×21
STAPLER RELOAD WHITE 60MM (STAPLE)
SUT ETHILON 3 0 PS 1 (SUTURE) ×3 IMPLANT
SUT MNCRL AB 4-0 PS2 18 (SUTURE) ×6 IMPLANT
SUT PDS AB 1 CT1 27 (SUTURE) ×11 IMPLANT
SUT VIC AB 2-0 SH 27 (SUTURE) ×3
SUT VIC AB 2-0 SH 27X BRD (SUTURE) ×1 IMPLANT
SUT VLOC BARB 180 ABS3/0GR12 (SUTURE) ×3
SUTURE VLOC BRB 180 ABS3/0GR12 (SUTURE) ×1 IMPLANT
TOWEL OR 17X26 10 PK STRL BLUE (TOWEL DISPOSABLE) ×3 IMPLANT
TOWEL OR NON WOVEN STRL DISP B (DISPOSABLE) ×3 IMPLANT
TRAY FOLEY MTR SLVR 16FR STAT (SET/KITS/TRAYS/PACK) ×3 IMPLANT
TRAY LAPAROSCOPIC (CUSTOM PROCEDURE TRAY) ×3 IMPLANT
TROCAR BLADELESS OPT 5 100 (ENDOMECHANICALS) IMPLANT
TROCAR UNIVERSAL OPT 12M 100M (ENDOMECHANICALS) ×3 IMPLANT
TROCAR XCEL 12X100 BLDLESS (ENDOMECHANICALS) ×3 IMPLANT
WATER STERILE IRR 1000ML POUR (IV SOLUTION) ×3 IMPLANT

## 2019-11-28 NOTE — Anesthesia Procedure Notes (Signed)
Date/Time: 11/28/2019 10:56 AM Performed by: Cynda Familia, CRNA Oxygen Delivery Method: Simple face mask Placement Confirmation: positive ETCO2 and breath sounds checked- equal and bilateral Dental Injury: Teeth and Oropharynx as per pre-operative assessment

## 2019-11-28 NOTE — Brief Op Note (Signed)
11/28/2019  8:34 PM  PATIENT:  Sean Hammond  79 y.o. male  PRE-OPERATIVE DIAGNOSIS:  RIGHT RENAL PELVIS CANCER  POST-OPERATIVE DIAGNOSIS:  RIGHT RENAL PELVIS CANCER  PROCEDURE:  Procedure(s) with comments: XI ROBOT ASSITED LAPAROSCOPIC NEPHROURETERECTOMY (Right) - 3 HRS  SURGEON:  Surgeon(s) and Role:    Alexis Frock, MD - Primary  PHYSICIAN ASSISTANT:   ASSISTANTS: Debbrah Alar PA   ANESTHESIA:   local and general  EBL:  100 mL   BLOOD ADMINISTERED:none  DRAINS: 1 - Foley to gravity; 2 - JP to bulb   LOCAL MEDICATIONS USED:  MARCAINE     SPECIMEN:  Source of Specimen:  1 - Rt kidney + adrenal + ureter + bladder cuff en bloc.   DISPOSITION OF SPECIMEN:  PATHOLOGY  COUNTS:  YES  TOURNIQUET:  * No tourniquets in log *  DICTATION: .Other Dictation: Dictation Number [id number]  PLAN OF CARE: Admit to inpatient   PATIENT DISPOSITION:  PACU - hemodynamically stable.   Delay start of Pharmacological VTE agent (>24hrs) due to surgical blood loss or risk of bleeding: yes

## 2019-11-28 NOTE — Transfer of Care (Signed)
Immediate Anesthesia Transfer of Care Note  Patient: Sean Hammond  Procedure(s) Performed: XI ROBOT ASSITED LAPAROSCOPIC NEPHROURETERECTOMY (Right )  Patient Location: PACU  Anesthesia Type:General  Level of Consciousness: awake and alert   Airway & Oxygen Therapy: Patient Spontanous Breathing and Patient connected to face mask oxygen  Post-op Assessment: Report given to RN and Post -op Vital signs reviewed and stable  Post vital signs: Reviewed and stable  Last Vitals:  Vitals Value Taken Time  BP 185/101 11/28/19 1105  Temp    Pulse 90 11/28/19 1106  Resp 16 11/28/19 1107  SpO2 100 % 11/28/19 1106  Vitals shown include unvalidated device data.  Last Pain:  Vitals:   11/28/19 0556  TempSrc:   PainSc: 0-No pain         Complications: No apparent anesthesia complications

## 2019-11-28 NOTE — Plan of Care (Signed)

## 2019-11-28 NOTE — Progress Notes (Signed)
Post-op note  Subjective: The patient is doing well.  Complaining of soreness and mild nausea. No vomiting.  Objective: Vital signs in last 24 hours: Temp:  [97 F (36.1 C)-97.9 F (36.6 C)] 97.9 F (36.6 C) (01/08 1322) Pulse Rate:  [77-100] 95 (01/08 1322) Resp:  [12-20] 16 (01/08 1322) BP: (120-187)/(80-106) 177/95 (01/08 1322) SpO2:  [99 %-100 %] 100 % (01/08 1322) Weight:  [87.5 kg] 87.5 kg (01/08 0533)  Intake/Output from previous day: No intake/output data recorded. Intake/Output this shift: Total I/O In: 3450 [I.V.:3350; IV Piggyback:100] Out: 650 [Urine:470; Drains:80; Blood:100]  Physical Exam:  General: Alert and oriented. Abdomen: Soft, Nondistended. Tender.  Incisions: Clean and dry. Urine: clear  Lab Results: Recent Labs    11/28/19 1114  HGB 13.9  HCT 42.6    Assessment/Plan: POD#0   1) Continue to monitor  2) DVT prophy, clears, IS, amb, pain control  3) Zofran prn  4) B/O now as pt's pain seems to be related more to spasm    LOS: 0 days   Debbrah Alar 11/28/2019, 3:09 PM

## 2019-11-28 NOTE — Discharge Instructions (Signed)

## 2019-11-28 NOTE — H&P (Signed)
Sean Hammond is an 79 y.o. male.    Chief Complaint: Pre-Op RIGHT Robotic Nephroureterectomy  HPI:   1 - Urinary retention - new urinary retention after elective cholecystectomy 03/2015. Started on tamsulosin at discharge. Prostate volume 90mL by ellipsoid calculation from CT 03/2015 with medain lobe. No baseline bothersome voiding symptoms whatsoever. Passed subsequetn trial of void. Cysto 2020 with bilboar prostate hypertrophy.   2 - High Grade Rigth Renal Pelvic Cancer - small volume Rt upper pole high grade cancer by ureteroscopy 08/2019 on eval hematuria / Rt flank pain / Rt hydro. Tumro vol about 2cm and extreme upper pole NO additional lesions. 5x26 polaris stent placed and remains in situ. Baseline Cr 1.0.   PMH sig for CAD/MI/Stent (follows [facility], cardiology, ASA only), skin cancer. 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 His PCP is Dr. Inda Merlin with [facility].   Today " [name]" is seen to proceed with RIGHT robotic nephroureterectomy. NO interval fevers. Most recent UCX negative.    Past Medical History:  Diagnosis Date  . Allergic rhinitis   . BPH (benign prostatic hyperplasia)    urologist-- dr t. [name]  . CAD (coronary artery disease) cardiologsit--- dr [name]   a. 12-14-2011 s/p PCI with DES x2 to midLCFx // b. Myoview 7/15: inferior ischemia, EF 52%, low risk  //  c. LHC 6/17: LCx stents patent, no sig CAD elsewhere, EF 65% >> Med Rx  . Chronic headaches    due to sinues  . ED (erectile dysfunction)   . GERD (gastroesophageal reflux disease)   . Hematuria   . History of acute pyelonephritis    08-11-2019  . History of echocardiogram    last one 03-22-2015   mild LVH,   ef 55-60%  . History of kidney stones   . History of melanoma excision    non-malignant excised from ear  . History of MI (myocardial infarction)    per pt remote hx yrs ago prior to 2013  . History of nonmelanoma skin cancer    per pt SCC and BCC from back, head, face   excision  . History of prostatitis   . Hydronephrosis, right   . Hyperlipidemia   . Moderate obstructive sleep apnea    not used cpap since 2014 bought bed that head would elevate   . Osteoarthritis of hand   . PONV (postoperative nausea and vomiting)    Pt may have been confused after surgery in the past  . Wears glasses     Past Surgical History:  Procedure Laterality Date  . BONE CYST EXCISION  ~ 2004   left knee  . CARDIAC CATHETERIZATION N/A 05/09/2016   Procedure: Left Heart Cath and Coronary Angiography;  Surgeon: Jerline Pain, MD;  Location: [facility];  Service: Cardiovascular;  Laterality: N/A;  . CHOLECYSTECTOMY N/A 03/23/2015   Procedure: LAPAROSCOPIC CHOLECYSTECTOMY;  Surgeon: Ralene Ok, MD;  Location: [facility];  Service: General;  Laterality: N/A;  . CORONARY ANGIOPLASTY WITH STENT PLACEMENT  12/14/11   "2"  . CYSTOSCOPY/RETROGRADE/URETEROSCOPY Bilateral 09/05/2019   Procedure: CYSTOSCOPY/ BILATERAL RETROGRADE/ RIGHT  URETEROSCOPY with biopsy with laser ablation of tumor;  Surgeon: Alexis Frock, MD;  Location: [facility];  Service: Urology;  Laterality: Bilateral;  . ETHMOIDECTOMY Right 09/01/2019   Procedure: RIGHT REVISION ENDOSCOPIC ETHMOIDECTOMY;  Surgeon: Izora Gala, MD;  Location: [facility];  Service: ENT;  Laterality: Right;  . FRONTAL SINUSOTOMY Bilateral 07-23-2003  dr Constance Holster   re-do  . HOLMIUM LASER APPLICATION Right [id number]   Procedure: HOLMIUM LASER APPLICATION;  Surgeon: Alexis Frock, MD;  Location: [facility];  Service: Urology;  Laterality: Right;  . KNEE ARTHROSCOPY Left ` 2010  . LEFT HEART CATHETERIZATION WITH CORONARY ANGIOGRAM N/A 12/14/2011   Procedure: LEFT HEART CATHETERIZATION WITH CORONARY ANGIOGRAM;  Surgeon: Candee Furbish, MD;  Location: [facility];  Service: Cardiovascular;  Laterality: N/A;  . PERCUTANEOUS CORONARY STENT INTERVENTION (PCI-S)  12/14/2011   Procedure:  PERCUTANEOUS CORONARY STENT INTERVENTION (PCI-S);  Surgeon: Candee Furbish, MD;  Location: [facility];  Service: Cardiovascular;;  . SEPTOPLASTY WITH ETHMOIDECTOMY, AND MAXILLARY ANTROSTOMY  09-22-2002   dr Constance Holster   and sinusotomy  . TOTAL KNEE ARTHROPLASTY Left 05/09/2017   Procedure: LEFT TOTAL KNEE ARTHROPLASTY;  Surgeon: Latanya Maudlin, MD;  Location: [facility];  Service: Orthopedics;  Laterality: Left;    Family History  Problem Relation Age of Onset  . Heart attack Mother   . Hypertension Mother    Social History:  reports that he quit smoking about 45 years ago. His smoking use included cigarettes. He has a 24.00 pack-year smoking history. He has never used smokeless tobacco. He reports previous alcohol use. He reports that he does not use drugs.  Allergies:  Allergies  Allergen Reactions  . Beef-Derived Products Anaphylaxis, Hives, Shortness Of Breath and Rash    Because of a tick bite, the patient now has "Alpha-gal allergy"  . Pork-Derived Products Anaphylaxis, Hives, Shortness Of Breath and Rash    Because of a tick bite, the patient now has "Alpha-gal allergy"  . Codeine Other (See Comments)    Surgeon advised against this  . Crestor [Rosuvastatin] Other (See Comments)    Muscle aches  . Dilaudid [Hydromorphone Hcl] Hives    All-over hives  . Hydrocodone Itching  . Lipitor [Atorvastatin] Other (See Comments)    Muscle aches   . Percocet [Oxycodone-Acetaminophen] Itching  . Pitavastatin Other (See Comments)    Leg pain    Medications Prior to Admission  Medication Sig Dispense Refill  . Cholecalciferol (VITAMIN D HIGH POTENCY) 1000 UNITS capsule Take 1,000 Units by mouth daily.    . diphenhydrAMINE (BENADRYL) 25 MG tablet Take 25 mg by mouth every 6 (six) hours as needed for allergies.     Marland Kitchen EPIPEN 2-PAK 0.3 MG/0.3ML SOAJ injection Inject 0.3 mg into the muscle once as needed (for an anaphylactic reaction).     . metoprolol succinate (TOPROL-XL) 25 MG 24 hr tablet TAKE 1  TABLET BY MOUTH EVERY DAY (Patient taking differently: Take 25 mg by mouth daily. ) 90 tablet 0  . nitroGLYCERIN (NITROSTAT) 0.4 MG SL tablet PLACE 1 TABLET UNDER TONGUE EVERY 5 MINUTES AS NEEDED FOR CHEST PAIN (Patient taking differently: Place 0.4 mg under the tongue every 5 (five) minutes as needed for chest pain. ) 25 tablet 3  . pantoprazole (PROTONIX) 40 MG tablet TAKE 1 TABLET BY MOUTH DAILY AT 12 NOON. (Patient taking differently: Take 40 mg by mouth daily after breakfast. ) 90 tablet 2  . PREVIDENT 5000 SENSITIVE [id number] % PSTE 1 application 2 (two) times daily.   6  . promethazine (PHENERGAN) 25 MG suppository Place 1 suppository (25 mg total) rectally every 6 (six) hours as needed for nausea or vomiting. 12 suppository 1  . tamsulosin (FLOMAX) 0.4 MG CAPS capsule Take 1 capsule (0.4 mg total) by mouth daily. 30 capsule 0  . aspirin 81 MG chewable  tablet Chew 81 mg by mouth daily.    Marland Kitchen HYDROcodone-acetaminophen (NORCO) 7.5-325 MG tablet Take 1 tablet by mouth every 6 (six) hours as needed for moderate pain. Post-operatively. (Patient not taking: Reported on 11/18/2019) 20 tablet 0    No results found for this or any previous visit (from the past 48 hour(s)). No results found.  Review of Systems  Constitutional: Negative for fever.  Genitourinary: Positive for hematuria and urgency.  All other systems reviewed and are negative.   Blood pressure 120/80, pulse 77, temperature 97.7 F (36.5 C), temperature source Oral, resp. rate 17, weight 87.5 kg, SpO2 99 %. Physical Exam  Constitutional: He appears well-developed.  HENT:  Head: Normocephalic.  Eyes: Pupils are equal, round, and reactive to light.  Cardiovascular: Normal rate.  Respiratory: Effort normal.  GI: Soft.  Musculoskeletal:        General: Normal range of motion.     Cervical back: Normal range of motion.  Neurological: He is alert.  Skin: Skin is warm.  Psychiatric: He has a normal mood and affect.      Assessment/Plan  Proceed as planned with RIGHT robotic nephroureterectomy. Risks, benefits, alternatives, expected peri-op course discussed previously and reiterated today.   Alexis Frock, MD 11/28/2019, 6:29 AM

## 2019-11-28 NOTE — Anesthesia Procedure Notes (Signed)
Procedure Name: Intubation Date/Time: 11/28/2019 7:38 AM Performed by: Cynda Familia, CRNA Pre-anesthesia Checklist: Patient identified, Emergency Drugs available, Suction available and Patient being monitored Patient Re-evaluated:Patient Re-evaluated prior to induction Oxygen Delivery Method: Circle System Utilized Preoxygenation: Pre-oxygenation with 100% oxygen Induction Type: IV induction Ventilation: Mask ventilation without difficulty Laryngoscope Size: Miller and 2 Grade View: Grade I Tube type: Oral Tube size: 7.5 mm Number of attempts: 1 Airway Equipment and Method: Stylet and Oral airway Placement Confirmation: ETT inserted through vocal cords under direct vision,  positive ETCO2 and breath sounds checked- equal and bilateral Secured at: 22 cm Tube secured with: Tape Dental Injury: Teeth and Oropharynx as per pre-operative assessment  Comments: Smooth IV induction Glennon Mac-- intubation AM CRNA atraumatic-- teeth and mouth as pre op bilat BS [employer]

## 2019-11-29 LAB — BASIC METABOLIC PANEL
Anion gap: 6 (ref 5–15)
BUN: 18 mg/dL (ref 8–23)
CO2: 25 mmol/L (ref 22–32)
Calcium: 7.9 mg/dL — ABNORMAL LOW (ref 8.9–10.3)
Chloride: 106 mmol/L (ref 98–111)
Creatinine, Ser: 1.89 mg/dL — ABNORMAL HIGH (ref 0.61–1.24)
GFR calc Af Amer: 39 mL/min — ABNORMAL LOW (ref >60)
GFR calc non Af Amer: 34 mL/min — ABNORMAL LOW (ref >60)
Glucose, Bld: 122 mg/dL — ABNORMAL HIGH (ref 70–99)
Potassium: 4 mmol/L (ref 3.5–5.1)
Sodium: 137 mmol/L (ref 135–145)

## 2019-11-29 LAB — HEMOGLOBIN AND HEMATOCRIT, BLOOD
HCT: 38 % — ABNORMAL LOW (ref 39.0–52.0)
Hemoglobin: 12.4 g/dL — ABNORMAL LOW (ref 13.0–17.0)

## 2019-11-29 NOTE — Op Note (Signed)
Sean Hammond, CARLE MEDICAL RECORD [id number] ACCOUNT [id number] DATE OF BIRTH:[birth year] FACILITY: [facility] LOCATION: [facility] PHYSICIAN:Aadhya Bustamante, MD  OPERATIVE REPORT  DATE OF PROCEDURE:  11/28/2019  PREOPERATIVE DIAGNOSIS:  Right renal pelvis high-grade cancer.  PROCEDURE PERFORMED:  Robotic-assisted laparoscopic right nephroureterectomy.  ESTIMATED BLOOD LOSS:  100 mL.  COMPLICATIONS:  None.  SPECIMEN:  Right kidney plus adrenal plus ureter plus bladder cuff with stent en bloc.  ASSISTANT:  Debbrah Alar, PA  FINDINGS:  Single artery, single vein, right renovascular anatomy.  DRAINS: 1.  Jackson-Pratt drain bulb suction. 2.  Foley catheter to straight drain.  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  This was not amenable to endoscopic  management given the volume of disease.  Options were discussed for management including recommended path of right radical nephroureterectomy with curative intent and he wished to proceed.  He has a stent in situ from his prior ureteroscopy.  Informed  consent was obtained and placed in the medical record.    PROCEDURE IN DETAIL:  The patient being verified and using right robotic nephroureterectomy was confirmed.  Procedure timeout was performed.  IV antibiotics were administered.  General endotracheal anesthesia was induced.  The patient was placed into the  right side up, full flank position, pulling 15 degrees of table flexion, superior arm elevator access, sequential compression devices, bottom leg bent, top leg straight.  He was further fashioned to the operative table using 3-inch tape over foam  padding across the supraxiphoid chest and his pelvis, and sterile field was created by clipper shaving and then prepping and draping the patient's entire right flank and abdomen  using chlorhexidine gluconate after Foley catheter was placed free to  straight drain.  Next, a high-flow, low-pressure pneumoperitoneum was obtained using Veress technique in the right lower quadrant, having passed the aspiration and drop test.  Next, an 8 mm robotic camera port was placed in position approximately 1  handbreadth superolateral to the umbilicus.  Laparoscopic examination in the peritoneal cavity revealed no significant adhesions, no visceral injury.  Distal ports were placed as follows:  Subcostal 5 mm liver retraction port through which a self-locking  grasper was used to elevate the inferior border of the liver away from the anterior surface of the kidney, right subcostal 8 mm robotic port approximately 1 fingerbreadth below the costal margin, right far lateral 8 mm robotic port approximately 2  fingerbreadths superior and medial to the anterior iliac spine, right paramedian inferior robotic port approximately 4 fingerbreadths superior to the pubic symphysis in a very purposely medial location, and then two 12 mm assistant port sites in the  midline one 4 fingerbreadth above the camera plane port and another 4 fingerbreadths below the plane of the camera port.  Robot was docked and passed the electronic checks.  Initial attention was directed at development of the retroperitoneum on the  right side.  Incision was made lateral to the descending colon from the area of the cecum towards the area of the hepatic flexure, and the colon was carefully swept medially.  The duodenum was encountered and carefully kocherized medially such that it  lie medial to the lateral surface of the inferior vena cava to further the pelvic dissection.  Incision was made lateral to the right medial umbilical ligament from the midline towards the area of the internal  ring and connecting to the previous lateral  colonic incisions and creating a very large retroperitoneal flap to be used for later bowel retraction.   The lower pole of the kidney area was identified, placed on gentle lateral traction.  Dissection proceeded medial to this.  The ureter and gonadal  vessels were encountered.  Gonadal vessels were purposely ligated using surgical clips.  The ureter was placed in lateral traction.  Dissection proceeded within this plane along the psoas musculature towards the area of the renal hilum.  Renal hilum  consisted of a single artery, single vein.  Renal vascular anatomy had dissipated.  The artery was controlled using an extra-large Hem-o-Lok clip proximal, stapler low distal, vascular type, and the vein was controlled using vascular load stapler  proximal to the area of the adrenal vein.  Dissection proceeded superiorly above the renal hilum at the level of the inferior vena cava and medial to the adrenal gland and superior to it.  Superior attachments were taken down using cautery scissors as  lateral attachments.  This completely freed up the renal portion of the dissection.  The ureter below the area of the lower pole of the kidney marked the vessel loop and continued to circumferentially be dissected towards the area of the deep pelvis.   The gonadal vessels were once again taken to the level of the internal ring.  The vas deferens was not manipulated to maintain ____ blood flow, and the aorta was dissected distal to this into the deep pelvis.  Exquisite care was taken to avoid injury to  the iliac vessels which became a roof of dissection, and the floor of dissection becoming the right medial umbilical ligament.  The ureter was circumferentially mobilized such that the superior vesicle artery was identified on the right side, and tenting  of the bladder was noted, denoting the ureterovesical junction.  At this point, a lateral suture of 3-0 V-Loc was placed approximately 6 mm lateral to the presumed plane of bladder cuff dissection, and bladder cuff dissection was performed using cautery  scissors  circumferentially, completely encompassing and not disturbing the in situ stent after an extra-large Hem-o-Lok clip was placed on the ureter to prevent antegrade spillage of tumor cells.  This resulted in approximately 7 mm in diameter bladder  cuff which was then oversewn using the previously placed V-Loc suture which resulted in excellent fascial apposition of the bladder cuff.  This completely freed up the neph-U specimen.  A specimen was placed into an extra-large EndoCatch bag for later retrieval.   Sponge and needle counts were correct.  Hemostasis appeared excellent.  A closed suction drain was brought through the previous right lateral-most robotic port site into the peritoneal cavity.  Robot was undocked.  The specimen was retrieved by  connecting the 2 previous assistant port sites in the midline.  The extraction site was then closed with fascia using figure-of-eight PDS x8 followed by reapproximation of Scarpa's with running Vicryl.  All incision sites were infiltrated with dilute  lipolyzed Marcaine and closed at the level of skin using subcuticular Monocryl followed by Dermabond.  The procedure was then terminated.  The patient tolerated the procedure well.  No immediate perioperative complications.  The patient was taken to  postanesthesia care in stable condition with plan for inpatient admission.  Please note first assistant, Debbrah Alar, was crucial for all portions of the procedure today.  She provided invaluable retraction, specimen manipulation, vascular clipping, vascular stapling, and general first assistance.  LN/NUANCE  D:11/28/2019 T:11/29/2019 JOB:[id number]

## 2019-11-29 NOTE — Progress Notes (Signed)
Urology Inpatient Progress Report  Renal mass [N28.89]  Procedure(s): XI ROBOT ASSITED LAPAROSCOPIC NEPHROURETERECTOMY  1 Day Post-Op   Intv/Subj: No acute events overnight. Patient is without complaint. Creatinine slightly elevated as expected.   Only slight decrease in hemoglobin to 12.4.  Last shift drain output 65.  He is having good urine output.  He has minimal complaints except for some pain with ambulation.  He is about to start ambulating some more.  He is very pleased with his progress thus far  Active Problems:   Renal mass  Current Facility-Administered Medications  Medication Dose Route Frequency Provider Last Rate Last Admin  . acetaminophen (OFIRMEV) IV 1,000 mg  1,000 mg Intravenous Q6H Dancy, Amanda, PA-C 400 mL/hr at 11/29/19 0621 1,000 mg at 11/29/19 [id number]  . Chlorhexidine Gluconate Cloth 2 % PADS 6 each  6 each Topical Daily Alexis Frock, MD      . diphenhydrAMINE (BENADRYL) injection 12.5 mg  12.5 mg Intravenous Q6H PRN Dancy, Amanda, PA-C       Or  . diphenhydrAMINE (BENADRYL) 12.5 MG/5ML elixir 12.5 mg  12.5 mg Oral Q6H PRN Dancy, Amanda, PA-C      . docusate (COLACE) 50 MG/5ML liquid 100 mg  100 mg Oral BID Debbrah Alar, PA-C   100 mg at 11/28/19 2230  . hydrALAZINE (APRESOLINE) tablet 10 mg  10 mg Oral Q6H PRN Tharon Aquas, MD      . HYDROmorphone (DILAUDID) injection 1 mg  1 mg Intravenous Q4H PRN Tharon Aquas, MD   1 mg at 11/29/19 0237  . metoprolol succinate (TOPROL-XL) 24 hr tablet 25 mg  25 mg Oral Daily Dancy, Amanda, PA-C      . neomycin-bacitracin-polymyxin (NEOSPORIN) ointment packet 1 application  1 application Topical TID PRN Dancy, Amanda, PA-C      . nitroGLYCERIN (NITROSTAT) SL tablet 0.4 mg  0.4 mg Sublingual Q5 min PRN Dancy, Amanda, PA-C      . ondansetron (ZOFRAN) injection 4 mg  4 mg Intravenous Q4H PRN Debbrah Alar, PA-C   4 mg at 11/28/19 1503  . opium-belladonna (B&O SUPPRETTES) 16.2-60 MG suppository 1 suppository  1 suppository  Rectal Q6H PRN Debbrah Alar, PA-C   1 suppository at 11/28/19 1511  . pantoprazole (PROTONIX) EC tablet 40 mg  40 mg Oral QPC breakfast Dancy, Amanda, PA-C      . tamsulosin (FLOMAX) capsule 0.4 mg  0.4 mg Oral Daily Dancy, Amanda, PA-C   0.4 mg at 11/28/19 1517  . traMADol (ULTRAM) tablet 50 mg  50 mg Oral Q6H PRN Tharon Aquas, MD         Objective: Vital: Vitals:   11/28/19 2049 11/28/19 2353 11/29/19 0424 11/29/19 0932  BP: 125/66 116/62 96/64 119/73  Pulse: 70 63 66 65  Resp: 18 18 18 18   Temp: 98.2 F (36.8 C) 98.3 F (36.8 C) 97.9 F (36.6 C) 98 F (36.7 C)  TempSrc: Oral Oral Oral Oral  SpO2: 100% 97% 97% 100%  Weight:      Height:       I/Os: I/O last 3 completed shifts: In: 5330.8 [P.O.:120; I.V.:4890.8; Other:20; IV [id number] Out: [id number] [Urine:2745; Drains:195; Blood:100]  Physical Exam:  General: Patient is in no apparent distress Lungs: Normal respiratory effort, chest expands symmetrically. GI: Incisions are c/d/i. The abdomen is soft and nontender without mass. JP drain with serosanguinous drainage Foley:  Draining clear yellow urine  Ext: lower extremities symmetric  Lab Results: Recent Labs    11/28/19 1114 11/29/19 0509  HGB 13.9 12.4*  HCT 42.6 38.0*   Recent Labs    11/29/19 0509  NA 137  K 4.0  CL 106  CO2 25  GLUCOSE 122*  BUN 18  CREATININE 1.89*  CALCIUM 7.9*   No results for input(s): LABPT, INR in the last 72 hours. No results for input(s): LABURIN in the last 72 hours. Results for orders placed or performed during the hospital encounter of 08/28/19  Novel Coronavirus, NAA (Hosp order, Send-out to Ref Lab; TAT 18-24 hrs     Status: None   Collection Time: 08/28/19  9:23 AM   Specimen: Nasopharyngeal Swab; Respiratory  Result Value Ref Range Status   SARS-CoV-2, NAA NOT DETECTED NOT DETECTED Final    Comment: (NOTE) This nucleic acid amplification test was developed and its performance characteristics determined by  [employer]. Nucleic acid amplification tests include PCR and TMA. This test has not been FDA cleared or approved. This test has been authorized by FDA under an Emergency Use Authorization (EUA). This test is only authorized for the duration of time the declaration that circumstances exist justifying the authorization of the emergency use of in vitro diagnostic tests for detection of SARS-CoV-2 virus and/or diagnosis of COVID-19 infection under section 564(b)(1) of the Act, 21 U.S.C. [id number]) (1), unless the authorization is terminated or revoked sooner. When diagnostic testing is negative, the possibility of a false negative result should be considered in the context of a patient's recent exposures and the presence of clinical signs and symptoms consistent with COVID-19. An individual without symptoms of COVID- 19 and who is not shedding SARS-CoV-2 vi rus would expect to have a negative (not detected) result in this assay. Performed At: [facility] [address] [place], [place] [id number] Rush Farmer MD [id number]    [facility] [place]  Final    Comment: Performed at [facility], [place] [address], [place], [place] [address]    Studies/Results: No results found.  Assessment:  right renal pelvis cancer  Procedure(s): XI ROBOT ASSITED LAPAROSCOPIC NEPHROURETERECTOMY, 1 Day Post-Op  doing well.  Plan:  continue Foley catheter  And JP  discontinue IV fluids  advanced diet to general  ambulate, incentive spirometry  SCDs for DVT prophylaxis.  He has severe allergy to pork products.  I spoke with pharmacy regarding this and Lovenox as well as heparin  Are contraindication for people with severe pork allergy.   Link Snuffer, MD Urology 11/29/2019, 10:03 AM

## 2019-11-30 LAB — BASIC METABOLIC PANEL
Anion gap: 7 (ref 5–15)
BUN: 18 mg/dL (ref 8–23)
CO2: 23 mmol/L (ref 22–32)
Calcium: 8.4 mg/dL — ABNORMAL LOW (ref 8.9–10.3)
Chloride: 107 mmol/L (ref 98–111)
Creatinine, Ser: 1.78 mg/dL — ABNORMAL HIGH (ref 0.61–1.24)
GFR calc Af Amer: 42 mL/min — ABNORMAL LOW (ref >60)
GFR calc non Af Amer: 36 mL/min — ABNORMAL LOW (ref >60)
Glucose, Bld: 106 mg/dL — ABNORMAL HIGH (ref 70–99)
Potassium: 4.3 mmol/L (ref 3.5–5.1)
Sodium: 137 mmol/L (ref 135–145)

## 2019-11-30 LAB — HEMOGLOBIN AND HEMATOCRIT, BLOOD
HCT: 40.2 % (ref 39.0–52.0)
Hemoglobin: 13.3 g/dL (ref 13.0–17.0)

## 2019-11-30 NOTE — Discharge Summary (Signed)
Physician Discharge Summary  Patient ID: Sean Hammond MRN: [id number] DOB/AGE: 77-Aug-1942 77 y.o.  Admit date: 11/28/2019 Discharge date: 11/30/2019  Admission Diagnoses:  Discharge Diagnoses:  Active Problems:   Renal mass   Discharged Condition: good  Hospital Course: Patient underwent a robotic assisted laparoscopic right nephro ureterectomy on 11/28/2019.  He tolerated the procedure well and was stable postoperatively.  By the time of discharge, creatinine was stable as was hemoglobin.  He was ambulating well tolerating a diet and was ready for discharge.  He was discharged with the Foley.  Consults: None  Significant Diagnostic Studies: None  Treatments: surgery: As above  Discharge Exam: Blood pressure (!) 152/97, pulse 93, temperature 98.2 F (36.8 C), temperature source Oral, resp. rate 18, height 6\' 1"  (1.854 m), weight 87.5 kg, SpO2 94 %. General appearance: alert, no acute distress Adequate perfusion of extremities Nonlabored respiration, adequate oxygen saturation Abdomen soft, minimally tender, incisions clean dry and intact, minimal JP fluid Foley draining clear yellow urine  Disposition:    Allergies as of 11/30/2019      Reactions   Beef-derived Products Anaphylaxis, Hives, Shortness Of Breath, Rash   Because of a tick bite, the patient now has "Alpha-gal allergy"   Pork-derived Products Anaphylaxis, Hives, Shortness Of Breath, Rash   Because of a tick bite, the patient now has "Alpha-gal allergy"   Codeine Other (See Comments)   Surgeon advised against this   Crestor [rosuvastatin] Other (See Comments)   Muscle aches   Dilaudid [hydromorphone Hcl] Hives   All-over hives   Hydrocodone Itching   Lipitor [atorvastatin] Other (See Comments)   Muscle aches   Percocet [oxycodone-acetaminophen] Itching   Pitavastatin Other (See Comments)   Leg pain      Medication List    STOP taking these medications   aspirin 81 MG chewable tablet    HYDROcodone-acetaminophen 7.5-325 MG tablet Commonly known as: Norco   Vitamin D High Potency 25 MCG (1000 UT) capsule Generic drug: Cholecalciferol     TAKE these medications   diphenhydrAMINE 25 MG tablet Commonly known as: BENADRYL Take 25 mg by mouth every 6 (six) hours as needed for allergies.   EpiPen 2-Pak 0.3 mg/0.3 mL Soaj injection Generic drug: EPINEPHrine Inject 0.3 mg into the muscle once as needed (for an anaphylactic reaction).   HYDROmorphone 2 MG tablet Commonly known as: Dilaudid Take 1 tablet (2 mg total) by mouth every 4 (four) hours as needed for moderate pain or severe pain.   metoprolol succinate 25 MG 24 hr tablet Commonly known as: TOPROL-XL TAKE 1 TABLET BY MOUTH EVERY DAY   nitroGLYCERIN 0.4 MG SL tablet Commonly known as: NITROSTAT PLACE 1 TABLET UNDER TONGUE EVERY 5 MINUTES AS NEEDED FOR CHEST PAIN What changed: See the new instructions.   pantoprazole 40 MG tablet Commonly known as: PROTONIX TAKE 1 TABLET BY MOUTH DAILY AT 12 NOON. What changed: See the new instructions.   PreviDent 5000 Sensitive 1.1-5 % Pste Generic drug: Sod Fluoride-Potassium Nitrate 1 application 2 (two) times daily.   promethazine 25 MG suppository Commonly known as: PHENERGAN Place 1 suppository (25 mg total) rectally every 6 (six) hours as needed for nausea or vomiting.   tamsulosin 0.4 MG Caps capsule Commonly known as: FLOMAX Take 1 capsule (0.4 mg total) by mouth daily.      Follow-up Information    Alexis Frock, MD On 12/08/2019.   Specialty: Urology Why: at 8 AM for pathology reveiw and catheter removal.  Contact information:  [address] [place] [address] [phone]           Signed: Marton Hammond, Sean Hammond 11/30/2019, 9:40 AM

## 2019-12-01 ENCOUNTER — Encounter: Payer: Self-pay | Admitting: *Deleted

## 2019-12-01 LAB — SURGICAL PATHOLOGY

## 2019-12-02 NOTE — Anesthesia Postprocedure Evaluation (Signed)
Anesthesia Post Note  Patient: [name]  Procedure(s) Performed: XI ROBOT ASSITED LAPAROSCOPIC NEPHROURETERECTOMY (Right )     Patient location during evaluation: PACU Anesthesia Type: General Level of consciousness: awake and alert Pain management: pain level controlled Vital Signs Assessment: post-procedure vital signs reviewed and stable Respiratory status: spontaneous breathing, nonlabored ventilation, respiratory function stable and patient connected to nasal cannula oxygen Cardiovascular status: blood pressure returned to baseline and stable Postop Assessment: no apparent nausea or vomiting Anesthetic complications: no    Last Vitals:  Vitals:   11/29/19 2034 11/30/19 0531  BP: (!) 166/89 (!) 152/97  Pulse: 86 93  Resp: 18 18  Temp: 36.9 C 36.8 C  SpO2: 96% 94%    Last Pain:  Vitals:   11/30/19 0901  TempSrc:   PainSc: 1                  Tiajuana Amass

## 2019-12-04 ENCOUNTER — Other Ambulatory Visit: Payer: Self-pay | Admitting: Cardiology

## 2019-12-06 ENCOUNTER — Other Ambulatory Visit: Payer: Self-pay | Admitting: Cardiology

## 2019-12-11 ENCOUNTER — Other Ambulatory Visit: Payer: Self-pay | Admitting: Cardiology

## 2019-12-24 DIAGNOSIS — I2510 Atherosclerotic heart disease of native coronary artery without angina pectoris: Secondary | ICD-10-CM | POA: Diagnosis not present

## 2019-12-24 DIAGNOSIS — I208 Other forms of angina pectoris: Secondary | ICD-10-CM | POA: Diagnosis not present

## 2019-12-24 DIAGNOSIS — M170 Bilateral primary osteoarthritis of knee: Secondary | ICD-10-CM | POA: Diagnosis not present

## 2019-12-24 DIAGNOSIS — E782 Mixed hyperlipidemia: Secondary | ICD-10-CM | POA: Diagnosis not present

## 2019-12-24 DIAGNOSIS — C641 Malignant neoplasm of right kidney, except renal pelvis: Secondary | ICD-10-CM | POA: Diagnosis not present

## 2019-12-26 ENCOUNTER — Other Ambulatory Visit: Payer: Self-pay | Admitting: Cardiology

## 2020-01-07 ENCOUNTER — Ambulatory Visit (INDEPENDENT_AMBULATORY_CARE_PROVIDER_SITE_OTHER): Payer: Medicare Other | Admitting: Cardiology

## 2020-01-07 ENCOUNTER — Other Ambulatory Visit: Payer: Self-pay

## 2020-01-07 ENCOUNTER — Encounter: Payer: Self-pay | Admitting: Cardiology

## 2020-01-07 VITALS — BP 124/64 | HR 57 | Ht 73.0 in | Wt 201.0 lb

## 2020-01-07 DIAGNOSIS — N1831 Chronic kidney disease, stage 3a: Secondary | ICD-10-CM

## 2020-01-07 DIAGNOSIS — E7800 Pure hypercholesterolemia, unspecified: Secondary | ICD-10-CM | POA: Diagnosis not present

## 2020-01-07 DIAGNOSIS — I2510 Atherosclerotic heart disease of native coronary artery without angina pectoris: Secondary | ICD-10-CM

## 2020-01-07 DIAGNOSIS — Z789 Other specified health status: Secondary | ICD-10-CM

## 2020-01-07 MED ORDER — ASPIRIN EC 81 MG PO TBEC: 81.0000 mg | DELAYED_RELEASE_TABLET | Freq: Every day | ORAL | 3 refills | Status: DC

## 2020-01-07 NOTE — Progress Notes (Signed)
Cardiology Office Note:    Date:  01/07/2020   ID:  Sean Hammond, DOB January 13, 1941, MRN [id number]  PCP:  Josetta Huddle, MD  Cardiologist:  Candee Furbish, MD  Electrophysiologist:  None   Referring MD: Josetta Huddle, MD     History of Present Illness:    Sean Hammond is a 79 y.o. male here for follow-up of CAD.  Overlapping drug-eluting stents to his mid circumflex with prior ejection fraction of 44% in January 2013.  Also CPAP for obstructive sleep apnea.  Repeat cardiac catheterization in June 2017 showed patent circumflex stent normal coronaries otherwise.  Normal EF.  Previously stopped Crestor other statins because of leg pain and flank pain.     Past Medical History:  Diagnosis Date  . Allergic rhinitis   . BPH (benign prostatic hyperplasia)    urologist-- dr t. [name]  . CAD (coronary artery disease) cardiologsit--- dr [name]   a. 12-14-2011 s/p PCI with DES x2 to midLCFx // b. Myoview 7/15: inferior ischemia, EF 52%, low risk  //  c. LHC 6/17: LCx stents patent, no sig CAD elsewhere, EF 65% >> Med Rx  . Chronic headaches    due to sinues  . ED (erectile dysfunction)   . GERD (gastroesophageal reflux disease)   . Hematuria   . History of acute pyelonephritis    08-11-2019  . History of echocardiogram    last one 03-22-2015   mild LVH,   ef 55-60%  . History of kidney stones   . History of melanoma excision    non-malignant excised from ear  . History of MI (myocardial infarction)    per pt remote hx yrs ago prior to 2013  . History of nonmelanoma skin cancer    per pt SCC and BCC from back, head, face  excision  . History of prostatitis   . Hydronephrosis, right   . Hyperlipidemia   . Moderate obstructive sleep apnea    not used cpap since 2014 bought bed that head would elevate   . Osteoarthritis of hand   . PONV (postoperative nausea and vomiting)    Pt may have been confused after surgery in the past  . Wears glasses     Past Surgical History:   Procedure Laterality Date  . BONE CYST EXCISION  ~ 2004   left knee  . CARDIAC CATHETERIZATION N/A 05/09/2016   Procedure: Left Heart Cath and Coronary Angiography;  Surgeon: Jerline Pain, MD;  Location: [facility];  Service: Cardiovascular;  Laterality: N/A;  . CHOLECYSTECTOMY N/A 03/23/2015   Procedure: LAPAROSCOPIC CHOLECYSTECTOMY;  Surgeon: Ralene Ok, MD;  Location: [facility];  Service: General;  Laterality: N/A;  . CORONARY ANGIOPLASTY WITH STENT PLACEMENT  12/14/11   "2"  . CYSTOSCOPY/RETROGRADE/URETEROSCOPY Bilateral 09/05/2019   Procedure: CYSTOSCOPY/ BILATERAL RETROGRADE/ RIGHT  URETEROSCOPY with biopsy with laser ablation of tumor;  Surgeon: Alexis Frock, MD;  Location: [facility];  Service: Urology;  Laterality: Bilateral;  . ETHMOIDECTOMY Right 09/01/2019   Procedure: RIGHT REVISION ENDOSCOPIC ETHMOIDECTOMY;  Surgeon: Izora Gala, MD;  Location: [facility];  Service: ENT;  Laterality: Right;  . FRONTAL SINUSOTOMY Bilateral 07-23-2003   dr Constance Holster   re-do  . HOLMIUM LASER APPLICATION Right [id number]   Procedure: HOLMIUM LASER APPLICATION;  Surgeon: Alexis Frock, MD;  Location: [facility];  Service: Urology;  Laterality: Right;  . KNEE ARTHROSCOPY Left ` 2010  . LEFT HEART CATHETERIZATION WITH CORONARY ANGIOGRAM N/A 12/14/2011  Procedure: LEFT HEART CATHETERIZATION WITH CORONARY ANGIOGRAM;  Surgeon: Candee Furbish, MD;  Location: [facility];  Service: Cardiovascular;  Laterality: N/A;  . PERCUTANEOUS CORONARY STENT INTERVENTION (PCI-S)  12/14/2011   Procedure: PERCUTANEOUS CORONARY STENT INTERVENTION (PCI-S);  Surgeon: Candee Furbish, MD;  Location: [facility];  Service: Cardiovascular;;  . ROBOT ASSITED LAPAROSCOPIC NEPHROURETERECTOMY Right 11/28/2019   Procedure: XI ROBOT ASSITED LAPAROSCOPIC NEPHROURETERECTOMY;  Surgeon: Alexis Frock, MD;  Location: [facility];  Service: Urology;  Laterality: Right;  3 HRS  . SEPTOPLASTY WITH  ETHMOIDECTOMY, AND MAXILLARY ANTROSTOMY  09-22-2002   dr Constance Holster   and sinusotomy  . TOTAL KNEE ARTHROPLASTY Left 05/09/2017   Procedure: LEFT TOTAL KNEE ARTHROPLASTY;  Surgeon: Latanya Maudlin, MD;  Location: [facility];  Service: Orthopedics;  Laterality: Left;    Current Medications: Current Meds  Medication Sig  . diphenhydrAMINE (BENADRYL) 25 MG tablet Take 25 mg by mouth every 6 (six) hours as needed for allergies.   Marland Kitchen EPIPEN 2-PAK 0.3 MG/0.3ML SOAJ injection Inject 0.3 mg into the muscle once as needed (for an anaphylactic reaction).   Marland Kitchen HYDROmorphone (DILAUDID) 2 MG tablet Take 1 tablet (2 mg total) by mouth every 4 (four) hours as needed for moderate pain or severe pain.  . metoprolol succinate (TOPROL-XL) 25 MG 24 hr tablet TAKE 1 TABLET BY MOUTH EVERY DAY  . nitroGLYCERIN (NITROSTAT) 0.4 MG SL tablet PLACE 1 TABLET UNDER TONGUE EVERY 5 MINUTES AS NEEDED FOR CHEST PAIN  . pantoprazole (PROTONIX) 40 MG tablet Take 1 tablet (40 mg total) by mouth daily after breakfast.  . PREVIDENT 5000 SENSITIVE 123XX123 % PSTE 1 application 2 (two) times daily.   . promethazine (PHENERGAN) 25 MG suppository Place 1 suppository (25 mg total) rectally every 6 (six) hours as needed for nausea or vomiting.  . tamsulosin (FLOMAX) 0.4 MG CAPS capsule Take 1 capsule (0.4 mg total) by mouth daily.     Allergies:   Beef-derived products, Pork-derived products, Codeine, Crestor [rosuvastatin], Dilaudid [hydromorphone hcl], Hydrocodone, Lipitor [atorvastatin], Percocet [oxycodone-acetaminophen], and Pitavastatin   Social History   Socioeconomic History  . Marital status: Married    Spouse name: Not on file  . Number of children: Not on file  . Years of education: Not on file  . Highest education level: Not on file  Occupational History  . Not on file  Tobacco Use  . Smoking status: Former Smoker    Packs/day: 2.00    Years: 12.00    Pack years: 24.00    Types: Cigarettes    Quit date: 09/03/1974    Years  since quitting: 45.3  . Smokeless tobacco: Never Used  Substance and Sexual Activity  . Alcohol use: Not Currently  . Drug use: Never  . Sexual activity: Not on file  Other Topics Concern  . Not on file  Social History Narrative  . Not on file   Social Determinants of Health   Financial Resource Strain:   . Difficulty of Paying Living Expenses: Not on file  Food Insecurity:   . Worried About Charity fundraiser in the Last Year: Not on file  . Ran Out of Food in the Last Year: Not on file  Transportation Needs:   . Lack of Transportation (Medical): Not on file  . Lack of Transportation (Non-Medical): Not on file  Physical Activity:   . Days of Exercise per Week: Not on file  . Minutes of Exercise per Session: Not on file  Stress:   .  Feeling of Stress : Not on file  Social Connections:   . Frequency of Communication with Friends and Family: Not on file  . Frequency of Social Gatherings with Friends and Family: Not on file  . Attends Religious Services: Not on file  . Active Member of Clubs or Organizations: Not on file  . Attends Archivist Meetings: Not on file  . Marital Status: Not on file     Family History: The patient's family history includes Heart attack in his mother; Hypertension in his mother.  ROS:   Please see the history of present illness.     All other systems reviewed and are negative.  EKGs/Labs/Other Studies Reviewed:    The following studies were reviewed today: Normal EF last check  EKG:  EKG is not ordered today.   prior EKG 12/17/2018 shows sinus rhythm 70 with PACs.  Recent Labs: 08/12/2019: ALT 17 11/24/2019: Platelets 219 11/30/2019: BUN 18; Creatinine, Ser 1.78; Hemoglobin 13.3; Potassium 4.3; Sodium 137  Recent Lipid Panel    Component Value Date/Time   CHOL 165 03/22/2015 0503   TRIG 73 03/22/2015 0503   HDL 45 03/22/2015 0503   CHOLHDL 3.7 03/22/2015 0503   VLDL 15 03/22/2015 0503   LDLCALC 105 (H) 03/22/2015 0503     Physical Exam:    VS:  BP 124/64   Pulse (!) 57   Ht 6\' 1"  (1.854 m)   Wt 201 lb (91.2 kg)   SpO2 (!) 87%   BMI 26.52 kg/m     Wt Readings from Last 3 Encounters:  01/07/20 201 lb (91.2 kg)  11/28/19 193 lb (87.5 kg)  11/24/19 197 lb (89.4 kg)     GEN:  Well nourished, well developed in no acute distress HEENT: Normal NECK: No JVD; No carotid bruits LYMPHATICS: No lymphadenopathy CARDIAC: RRR, no murmurs, rubs, gallops RESPIRATORY:  Clear to auscultation without rales, wheezing or rhonchi  ABDOMEN: Soft, non-tender, non-distended MUSCULOSKELETAL:  No edema; No deformity  SKIN: Warm and dry NEUROLOGIC:  Alert and oriented x 3 PSYCHIATRIC:  Normal affect   ASSESSMENT:    1. Coronary artery disease involving native coronary artery of native heart without angina pectoris   2. Pure hypercholesterolemia   3. Statin intolerance   4. Stage 3a chronic kidney disease    PLAN:    In order of problems listed above:  Coronary artery disease -Circumflex stents.  2013.  Reassuring in 2017 cath.  Angina seems to be reasonably well controlled.  He resumed his aspirin 81 mg after his nephrectomy.  He has not had any bleeding.  Statin intolerance -We have tried pretty much all of them.  Not interested in injectables.  Fatigue -Previously thought to be multifactorial.  Overall doing well.  Hyperlipidemia -Prior LDL 106.  Hemoglobin 13.3 creatinine 1.7  Chronic kidney disease stage III -Prior creatinine 1.8 11/30/2019 from outside labs. -Underwent robotic assisted laparoscopic right nephrectomy to me with ureterectomy as well on 11/28/2019.  This was for renal mass.  Dr. [name].  Doing well.   Medication Adjustments/Labs and Tests Ordered: Current medicines are reviewed at length with the patient today.  Concerns regarding medicines are outlined above.  No orders of the defined types were placed in this encounter.  Meds ordered this encounter  Medications  . aspirin EC 81 MG  tablet    Sig: Take 1 tablet (81 mg total) by mouth daily.    Dispense:  90 tablet    Refill:  3  Patient Instructions  Medication Instructions:  The current medical regimen is effective;  continue present plan and medications.  *If you need a refill on your cardiac medications before your next appointment, please call your pharmacy*  Follow-Up: At [facility], you and your health needs are our priority.  As part of our continuing mission to provide you with exceptional heart care, we have created designated Provider Care Teams.  These Care Teams include your primary Cardiologist (physician) and Advanced Practice Providers (APPs -  Physician Assistants and Nurse Practitioners) who all work together to provide you with the care you need, when you need it.  Your next appointment:   12 month(s)  The format for your next appointment:   In Person  Provider:   Candee Furbish, MD  Thank you for choosing [facility]!!         Signed, Candee Furbish, MD  01/07/2020 12:02 PM    [place]

## 2020-01-07 NOTE — Patient Instructions (Signed)
Medication Instructions:  The current medical regimen is effective;  continue present plan and medications.  *If you need a refill on your cardiac medications before your next appointment, please call your pharmacy*  Follow-Up: At [facility], you and your health needs are our priority.  As part of our continuing mission to provide you with exceptional heart care, we have created designated Provider Care Teams.  These Care Teams include your primary Cardiologist (physician) and Advanced Practice Providers (APPs -  Physician Assistants and Nurse Practitioners) who all work together to provide you with the care you need, when you need it.  Your next appointment:   12 month(s)  The format for your next appointment:   In Person  Provider:   Mark Skains, MD   Thank you for choosing [facility]!!     

## 2020-01-10 DIAGNOSIS — J019 Acute sinusitis, unspecified: Secondary | ICD-10-CM | POA: Diagnosis not present

## 2020-01-10 DIAGNOSIS — J309 Allergic rhinitis, unspecified: Secondary | ICD-10-CM | POA: Diagnosis not present

## 2020-01-18 ENCOUNTER — Other Ambulatory Visit: Payer: Self-pay | Admitting: Cardiology

## 2020-01-23 DIAGNOSIS — I2510 Atherosclerotic heart disease of native coronary artery without angina pectoris: Secondary | ICD-10-CM | POA: Diagnosis not present

## 2020-01-23 DIAGNOSIS — M170 Bilateral primary osteoarthritis of knee: Secondary | ICD-10-CM | POA: Diagnosis not present

## 2020-01-23 DIAGNOSIS — I208 Other forms of angina pectoris: Secondary | ICD-10-CM | POA: Diagnosis not present

## 2020-01-23 DIAGNOSIS — Z23 Encounter for immunization: Secondary | ICD-10-CM | POA: Diagnosis not present

## 2020-01-23 DIAGNOSIS — C641 Malignant neoplasm of right kidney, except renal pelvis: Secondary | ICD-10-CM | POA: Diagnosis not present

## 2020-01-23 DIAGNOSIS — E782 Mixed hyperlipidemia: Secondary | ICD-10-CM | POA: Diagnosis not present

## 2020-02-09 DIAGNOSIS — R8290 Unspecified abnormal findings in urine: Secondary | ICD-10-CM | POA: Diagnosis not present

## 2020-02-09 DIAGNOSIS — M109 Gout, unspecified: Secondary | ICD-10-CM | POA: Diagnosis not present

## 2020-02-09 DIAGNOSIS — R300 Dysuria: Secondary | ICD-10-CM | POA: Diagnosis not present

## 2020-02-20 DIAGNOSIS — Z23 Encounter for immunization: Secondary | ICD-10-CM | POA: Diagnosis not present

## 2020-02-27 DIAGNOSIS — I2510 Atherosclerotic heart disease of native coronary artery without angina pectoris: Secondary | ICD-10-CM | POA: Diagnosis not present

## 2020-02-27 DIAGNOSIS — C641 Malignant neoplasm of right kidney, except renal pelvis: Secondary | ICD-10-CM | POA: Diagnosis not present

## 2020-02-27 DIAGNOSIS — I208 Other forms of angina pectoris: Secondary | ICD-10-CM | POA: Diagnosis not present

## 2020-02-27 DIAGNOSIS — M170 Bilateral primary osteoarthritis of knee: Secondary | ICD-10-CM | POA: Diagnosis not present

## 2020-02-27 DIAGNOSIS — E782 Mixed hyperlipidemia: Secondary | ICD-10-CM | POA: Diagnosis not present

## 2020-03-06 ENCOUNTER — Other Ambulatory Visit: Payer: Self-pay | Admitting: Cardiology

## 2020-03-09 DIAGNOSIS — M109 Gout, unspecified: Secondary | ICD-10-CM | POA: Diagnosis not present

## 2020-03-13 DIAGNOSIS — J0100 Acute maxillary sinusitis, unspecified: Secondary | ICD-10-CM | POA: Diagnosis not present

## 2020-03-23 DIAGNOSIS — C44329 Squamous cell carcinoma of skin of other parts of face: Secondary | ICD-10-CM | POA: Diagnosis not present

## 2020-03-23 DIAGNOSIS — L570 Actinic keratosis: Secondary | ICD-10-CM | POA: Diagnosis not present

## 2020-03-31 ENCOUNTER — Other Ambulatory Visit: Payer: Self-pay | Admitting: Internal Medicine

## 2020-03-31 ENCOUNTER — Ambulatory Visit
Admission: RE | Admit: 2020-03-31 | Discharge: 2020-03-31 | Disposition: A | Discharge status: Home / Self Care | Payer: Medicare Other | Source: Ambulatory Visit | Attending: Geriatric Medicine | Admitting: Geriatric Medicine

## 2020-03-31 ENCOUNTER — Other Ambulatory Visit: Payer: Self-pay | Admitting: Geriatric Medicine

## 2020-03-31 DIAGNOSIS — N281 Cyst of kidney, acquired: Secondary | ICD-10-CM | POA: Diagnosis not present

## 2020-03-31 DIAGNOSIS — R109 Unspecified abdominal pain: Secondary | ICD-10-CM | POA: Diagnosis not present

## 2020-06-01 DIAGNOSIS — C651 Malignant neoplasm of right renal pelvis: Secondary | ICD-10-CM | POA: Diagnosis not present

## 2020-06-03 ENCOUNTER — Other Ambulatory Visit: Payer: Self-pay

## 2020-06-03 ENCOUNTER — Other Ambulatory Visit (HOSPITAL_COMMUNITY): Payer: Self-pay | Admitting: Urology

## 2020-06-03 ENCOUNTER — Ambulatory Visit (HOSPITAL_COMMUNITY)
Admission: RE | Admit: 2020-06-03 | Discharge: 2020-06-03 | Disposition: A | Discharge status: Home / Self Care | Payer: Medicare Other | Source: Ambulatory Visit | Attending: Urology | Admitting: Urology

## 2020-06-03 DIAGNOSIS — C651 Malignant neoplasm of right renal pelvis: Secondary | ICD-10-CM | POA: Insufficient documentation

## 2020-06-03 DIAGNOSIS — Z85528 Personal history of other malignant neoplasm of kidney: Secondary | ICD-10-CM | POA: Diagnosis not present

## 2020-06-03 DIAGNOSIS — M47814 Spondylosis without myelopathy or radiculopathy, thoracic region: Secondary | ICD-10-CM | POA: Diagnosis not present

## 2020-06-03 DIAGNOSIS — K8689 Other specified diseases of pancreas: Secondary | ICD-10-CM | POA: Diagnosis not present

## 2020-06-08 DIAGNOSIS — M545 Low back pain: Secondary | ICD-10-CM | POA: Diagnosis not present

## 2020-06-08 DIAGNOSIS — C651 Malignant neoplasm of right renal pelvis: Secondary | ICD-10-CM | POA: Diagnosis not present

## 2020-06-08 DIAGNOSIS — Z905 Acquired absence of kidney: Secondary | ICD-10-CM | POA: Diagnosis not present

## 2020-06-08 DIAGNOSIS — R339 Retention of urine, unspecified: Secondary | ICD-10-CM | POA: Diagnosis not present

## 2020-06-08 DIAGNOSIS — N281 Cyst of kidney, acquired: Secondary | ICD-10-CM | POA: Diagnosis not present

## 2020-06-17 ENCOUNTER — Other Ambulatory Visit: Payer: Self-pay

## 2020-06-17 ENCOUNTER — Ambulatory Visit (INDEPENDENT_AMBULATORY_CARE_PROVIDER_SITE_OTHER): Payer: Medicare Other | Admitting: Allergy and Immunology

## 2020-06-17 ENCOUNTER — Encounter: Payer: Self-pay | Admitting: Allergy and Immunology

## 2020-06-17 VITALS — BP 144/74 | HR 68 | Temp 98.0°F | Resp 14 | Ht 70.2 in | Wt 198.0 lb

## 2020-06-17 DIAGNOSIS — G4719 Other hypersomnia: Secondary | ICD-10-CM | POA: Diagnosis not present

## 2020-06-17 DIAGNOSIS — T7800XA Anaphylactic reaction due to unspecified food, initial encounter: Secondary | ICD-10-CM

## 2020-06-17 DIAGNOSIS — I2510 Atherosclerotic heart disease of native coronary artery without angina pectoris: Secondary | ICD-10-CM

## 2020-06-17 DIAGNOSIS — R5383 Other fatigue: Secondary | ICD-10-CM

## 2020-06-17 DIAGNOSIS — J3489 Other specified disorders of nose and nasal sinuses: Secondary | ICD-10-CM | POA: Diagnosis not present

## 2020-06-17 MED ORDER — IPRATROPIUM BROMIDE 0.06 % NA SOLN: NASAL | 5 refills | Status: DC

## 2020-06-17 NOTE — Progress Notes (Signed)
[facility] [place] [facility] [employer]   Follow-up Note  Referring Provider: No ref. provider found Primary Provider: Josetta Huddle, MD Date of Office Visit: 06/17/2020  Subjective:   Sean Hammond (DOB: Apr 03, 1941) is a 79 y.o. male who returns to the [facility] [place] [facility] on 06/17/2020 in re-evaluation of the following:  HPI: Sean Hammond presents to this clinic in evaluation of alpha gal syndrome.  His last visit to this clinic was 18 October 2015.  He has been mammal free and has not had any allergic reactions.  He has an injectable epinephrine device.  He complains of having problems with a runny nose.  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  He does not have any other associated upper airway symptoms.  He does not have nasal congestion or sneezing or ugly nasal discharge or anosmia.  He also complains of having fatigue.  Apparently over the course of the past year he has been extremely fatigued.  He wakes up in the morning and by 10:00 AM he has to lay down and rest for a while.  He is sleepy when driving the car.  He does not have any snoring or obvious apneic issues at nighttime.  He cannot remember having blood test performed in investigation of his fatigue.  He has a distant history of being diagnosed with sleep apnea but does not use any therapy for this condition.  Sean Hammond informs me that he had a nephrectomy performed in January 2021 for what sounds like renal cell carcinoma.  He has received 2 Moderna Covid vaccinations  Allergies as of 06/17/2020      Reactions   Beef-derived Products Anaphylaxis, Hives, Shortness Of Breath, Rash   Because of a tick bite, the patient now has "Alpha-gal allergy"   Pork-derived Products Anaphylaxis, Hives, Shortness Of Breath, Rash   Because of a tick bite, the patient now has "Alpha-gal allergy"   Codeine Other (See Comments)   Surgeon  advised against this   Crestor [rosuvastatin] Other (See Comments)   Muscle aches   Dilaudid [hydromorphone Hcl] Hives   All-over hives   Hydrocodone Itching   Lipitor [atorvastatin] Other (See Comments)   Muscle aches   Percocet [oxycodone-acetaminophen] Itching   Pitavastatin Other (See Comments)   Leg pain      Medication List    aspirin EC 81 MG tablet Take 1 tablet (81 mg total) by mouth daily.   diphenhydrAMINE 25 MG tablet Commonly known as: BENADRYL Take 25 mg by mouth every 6 (six) hours as needed for allergies.   EpiPen 2-Pak 0.3 mg/0.3 mL Soaj injection Generic drug: EPINEPHrine Inject 0.3 mg into the muscle once as needed (for an anaphylactic reaction).   metoprolol succinate 25 MG 24 hr tablet Commonly known as: TOPROL-XL TAKE 1 TABLET BY MOUTH EVERY DAY   MULTIVITAMIN PO Take by mouth.   nitroGLYCERIN 0.4 MG SL tablet Commonly known as: NITROSTAT PLACE 1 TABLET UNDER TONGUE EVERY 5 MINUTES AS NEEDED FOR CHEST PAIN MAX 3 TABS/15 MIN   pantoprazole 40 MG tablet Commonly known as: PROTONIX Take 1 tablet (40 mg total) by mouth daily after breakfast.   tamsulosin 0.4 MG Caps capsule Commonly known as: FLOMAX Take 1 capsule (0.4 mg total) by mouth daily.   traMADol 50 MG tablet Commonly known as: ULTRAM Take by mouth every 6 (six) hours as needed.   VITAMIN  C PO Take by mouth.   VITAMIN D PO Take by mouth.       Past Medical History:  Diagnosis Date  . Allergic rhinitis   . BPH (benign prostatic hyperplasia)    urologist-- dr t. [name]  . CAD (coronary artery disease) cardiologsit--- dr [name]   a. 12-14-2011 s/p PCI with DES x2 to midLCFx // b. Myoview 7/15: inferior ischemia, EF 52%, low risk  //  c. LHC 6/17: LCx stents patent, no sig CAD elsewhere, EF 65% >> Med Rx  . Chronic headaches    due to sinues  . ED (erectile dysfunction)   . GERD (gastroesophageal reflux disease)   . Hematuria   . History of acute pyelonephritis    08-11-2019   . History of echocardiogram    last one 03-22-2015   mild LVH,   ef 55-60%  . History of kidney stones   . History of melanoma excision    non-malignant excised from ear  . History of MI (myocardial infarction)    per pt remote hx yrs ago prior to 2013  . History of nonmelanoma skin cancer    per pt SCC and BCC from back, head, face  excision  . History of prostatitis   . Hydronephrosis, right   . Hyperlipidemia   . Moderate obstructive sleep apnea    not used cpap since 2014 bought bed that head would elevate   . Osteoarthritis of hand   . PONV (postoperative nausea and vomiting)    Pt may have been confused after surgery in the past  . Wears glasses     Past Surgical History:  Procedure Laterality Date  . BONE CYST EXCISION  ~ 2004   left knee  . CARDIAC CATHETERIZATION N/A 05/09/2016   Procedure: Left Heart Cath and Coronary Angiography;  Surgeon: Jerline Pain, MD;  Location: [facility];  Service: Cardiovascular;  Laterality: N/A;  . CHOLECYSTECTOMY N/A 03/23/2015   Procedure: LAPAROSCOPIC CHOLECYSTECTOMY;  Surgeon: Ralene Ok, MD;  Location: [facility];  Service: General;  Laterality: N/A;  . CORONARY ANGIOPLASTY WITH STENT PLACEMENT  12/14/11   "2"  . CYSTOSCOPY/RETROGRADE/URETEROSCOPY Bilateral 09/05/2019   Procedure: CYSTOSCOPY/ BILATERAL RETROGRADE/ RIGHT  URETEROSCOPY with biopsy with laser ablation of tumor;  Surgeon: Alexis Frock, MD;  Location: [facility];  Service: Urology;  Laterality: Bilateral;  . ETHMOIDECTOMY Right 09/01/2019   Procedure: RIGHT REVISION ENDOSCOPIC ETHMOIDECTOMY;  Surgeon: Izora Gala, MD;  Location: [facility];  Service: ENT;  Laterality: Right;  . FRONTAL SINUSOTOMY Bilateral 07-23-2003   dr Constance Holster   re-do  . HOLMIUM LASER APPLICATION Right 99/83/3825   Procedure: HOLMIUM LASER APPLICATION;  Surgeon: Alexis Frock, MD;  Location: [facility];  Service: Urology;  Laterality: Right;  .  KNEE ARTHROSCOPY Left ` 2010  . LEFT HEART CATHETERIZATION WITH CORONARY ANGIOGRAM N/A 12/14/2011   Procedure: LEFT HEART CATHETERIZATION WITH CORONARY ANGIOGRAM;  Surgeon: Candee Furbish, MD;  Location: [facility];  Service: Cardiovascular;  Laterality: N/A;  . PERCUTANEOUS CORONARY STENT INTERVENTION (PCI-S)  12/14/2011   Procedure: PERCUTANEOUS CORONARY STENT INTERVENTION (PCI-S);  Surgeon: Candee Furbish, MD;  Location: [facility];  Service: Cardiovascular;;  . ROBOT ASSITED LAPAROSCOPIC NEPHROURETERECTOMY Right 11/28/2019   Procedure: XI ROBOT ASSITED LAPAROSCOPIC NEPHROURETERECTOMY;  Surgeon: Alexis Frock, MD;  Location: [facility];  Service: Urology;  Laterality: Right;  3 HRS  . SEPTOPLASTY WITH ETHMOIDECTOMY, AND MAXILLARY ANTROSTOMY  09-22-2002   dr Constance Holster  and sinusotomy  . TOTAL KNEE ARTHROPLASTY Left 05/09/2017   Procedure: LEFT TOTAL KNEE ARTHROPLASTY;  Surgeon: Latanya Maudlin, MD;  Location: [facility];  Service: Orthopedics;  Laterality: Left;    Review of systems negative except as noted in HPI / PMHx or noted below:  Review of Systems  Constitutional: Negative.   HENT: Negative.   Eyes: Negative.   Respiratory: Negative.   Cardiovascular: Negative.   Gastrointestinal: Negative.   Genitourinary: Negative.   Musculoskeletal: Negative.   Skin: Negative.   Neurological: Negative.   Endo/Heme/Allergies: Negative.   Psychiatric/Behavioral: Negative.      Objective:   Vitals:   06/17/20 1000  BP: (!) 144/74  Pulse: 68  Resp: 14  Temp: 98 F (36.7 C)  SpO2: 98%   Height: 5' 10.2" (178.3 cm)  Weight: 198 lb (89.8 kg)   Physical Exam Constitutional:      Appearance: He is not diaphoretic.  HENT:     Head: Normocephalic.     Right Ear: Tympanic membrane, ear canal and external ear normal.     Left Ear: Tympanic membrane, ear canal and external ear normal.     Nose: Nose normal. No mucosal edema or rhinorrhea.     Mouth/Throat:     Pharynx: Uvula midline. No  oropharyngeal exudate.  Eyes:     Conjunctiva/sclera: Conjunctivae normal.  Neck:     Thyroid: No thyromegaly.     Trachea: Trachea normal. No tracheal tenderness or tracheal deviation.  Cardiovascular:     Rate and Rhythm: Normal rate and regular rhythm.     Heart sounds: Normal heart sounds, S1 normal and S2 normal. No murmur heard.   Pulmonary:     Effort: No respiratory distress.     Breath sounds: Normal breath sounds. No stridor. No wheezing or rales.  Lymphadenopathy:     Head:     Right side of head: No tonsillar adenopathy.     Left side of head: No tonsillar adenopathy.     Cervical: No cervical adenopathy.  Skin:    Findings: No erythema or rash.     Nails: There is no clubbing.  Neurological:     Mental Status: He is alert.     Diagnostics: none  Assessment and Plan:   1. Allergy with anaphylaxis due to food   2. Rhinorrhea   3. Other fatigue   4. Excessive daytime sleepiness     1.  Continue avoidance measures directed against mammal meat  2.  Continue EpiPen, Benadryl, MD/ER evaluation for allergic reaction  3.  Can use nasal ipratropium 0.06% -1-2 sprays each nostril every 6 hours as needed to dry up nose.  Can use prior to eating.  4.  Evaluation for fatigue / sleepiness:   A.  Obtain nocturnal oximetry study  B.  Blood - CBC w/D, TSH, FT4, testosterone (total and free), LFT panel  5.  Further evaluation?  6.  Obtain fall flu vaccine.   7.  Return to clinic in 1 year or earlier if problem  Sean Hammond is doing well regarding his alpha gal syndrome while avoiding mammal meat consumption.  He has chronic rhinorrhea and we will start him on some nasal ipratropium.  He has chronic fatigue and sleepiness and will obtain a nocturnal oximetry study to see if he is developing nocturnal deoxygenation and also perform a few screening blood tests in investigation of his fatigue and sleepiness.  I will contact him with the results of his blood test once they  are  available for review.  Allena Katz, MD [facility] [place]

## 2020-06-17 NOTE — Patient Instructions (Addendum)
  1.  Continue avoidance measures directed against mammal meat  2.  Continue EpiPen, Benadryl, MD/ER evaluation for allergic reaction  3.  Can use nasal ipratropium 0.06% -1-2 sprays each nostril every 6 hours as needed to dry up nose.  Can use prior to eating.  4.  Evaluation for fatigue / sleepiness:   A.  Obtain nocturnal oximetry study  B.  Blood - CBC w/D, TSH, FT4, testosterone (total and free), LFT panel  5.  Further evaluation?  6.  Obtain fall flu vaccine.   7.  Return to clinic in 1 year or earlier if problem

## 2020-06-21 ENCOUNTER — Encounter: Payer: Self-pay | Admitting: Allergy and Immunology

## 2020-06-21 DIAGNOSIS — R5383 Other fatigue: Secondary | ICD-10-CM | POA: Diagnosis not present

## 2020-06-21 DIAGNOSIS — G4719 Other hypersomnia: Secondary | ICD-10-CM | POA: Diagnosis not present

## 2020-06-25 LAB — CBC WITH DIFFERENTIAL/PLATELET
Basophils Absolute: 0.1 10*3/uL (ref 0.0–0.2)
Basos: 1 %
EOS (ABSOLUTE): 0.2 10*3/uL (ref 0.0–0.4)
Eos: 2 %
Hematocrit: 44.8 % (ref 37.5–51.0)
Hemoglobin: 15.1 g/dL (ref 13.0–17.7)
Immature Grans (Abs): 0 10*3/uL (ref 0.0–0.1)
Immature Granulocytes: 0 %
Lymphocytes Absolute: 1.8 10*3/uL (ref 0.7–3.1)
Lymphs: 26 %
MCH: 31.1 pg (ref 26.6–33.0)
MCHC: 33.7 g/dL (ref 31.5–35.7)
MCV: 92 fL (ref 79–97)
Monocytes Absolute: 0.5 10*3/uL (ref 0.1–0.9)
Monocytes: 7 %
Neutrophils Absolute: 4.5 10*3/uL (ref 1.4–7.0)
Neutrophils: 64 %
Platelets: 184 10*3/uL (ref 150–450)
RBC: 4.86 x10E6/uL (ref 4.14–5.80)
RDW: 12.5 % (ref 11.6–15.4)
WBC: 7.1 10*3/uL (ref 3.4–10.8)

## 2020-06-25 LAB — TESTOSTERONE,FREE AND TOTAL
Testosterone, Free: 6.2 pg/mL — ABNORMAL LOW (ref 6.6–18.1)
Testosterone: 555 ng/dL (ref 264–916)

## 2020-06-25 LAB — HEPATIC FUNCTION PANEL
ALT: 17 IU/L (ref 0–44)
AST: 25 IU/L (ref 0–40)
Albumin: 4.4 g/dL (ref 3.7–4.7)
Alkaline Phosphatase: 82 IU/L (ref 48–121)
Bilirubin Total: 0.4 mg/dL (ref 0.0–1.2)
Bilirubin, Direct: 0.11 mg/dL (ref 0.00–0.40)
Total Protein: 7.2 g/dL (ref 6.0–8.5)

## 2020-06-25 LAB — TSH+FREE T4
Free T4: 1.29 ng/dL (ref 0.82–1.77)
TSH: 2 u[IU]/mL (ref 0.450–4.500)

## 2020-06-29 ENCOUNTER — Telehealth: Payer: Self-pay | Admitting: Cardiology

## 2020-06-29 NOTE — Telephone Encounter (Signed)
Spoke with daughter Adonis Huguenin (ok per DPR) pt has been c/o increased SOB (which she can tell when he talks) chest pressure like a ball in his chest with radiation to his back.  She also reports he has had several recent episodes of feeling faint.   He did see Dr Allena Katz 06/17/2020 who ordered labs which included CBC, TSH, Free T4 and hepatic panel - all of which are normal.  He was to be evaluated for fatigue/sleepiness with sleep study however pt has now refused per documentation.   Daughter states her father doesn't usually complain and she is concerned.  She has asked him to be seen in the ER but he refuses to go.  Scheduled appt for 8/11 for further evaluation.

## 2020-06-29 NOTE — Telephone Encounter (Signed)
Pt c/o Shortness Of Breath: STAT if SOB developed within the last 24 hours or pt is noticeably SOB on the phone  1. Are you currently SOB (can you hear that pt is SOB on the phone)? no*  2. How long have you been experiencing SOB? Been going on for a few weeks  3. Are you SOB when sitting or when up moving around?when moving around  4. Are you currently experiencing any other symptoms? Feels like a ball in his chest, chest pains and hurting in his back- pt wants to be seen

## 2020-06-30 ENCOUNTER — Encounter: Payer: Self-pay | Admitting: Physician Assistant

## 2020-06-30 ENCOUNTER — Ambulatory Visit (INDEPENDENT_AMBULATORY_CARE_PROVIDER_SITE_OTHER): Payer: Medicare Other | Admitting: Physician Assistant

## 2020-06-30 ENCOUNTER — Other Ambulatory Visit: Payer: Self-pay

## 2020-06-30 ENCOUNTER — Encounter (HOSPITAL_COMMUNITY): Payer: Self-pay | Admitting: Cardiology

## 2020-06-30 ENCOUNTER — Inpatient Hospital Stay (HOSPITAL_COMMUNITY): Payer: Medicare Other

## 2020-06-30 ENCOUNTER — Inpatient Hospital Stay (HOSPITAL_COMMUNITY)
Admission: AD | Admit: 2020-06-30 | Discharge: 2020-07-05 | DRG: 247 | Disposition: A | Discharge status: Home / Self Care | Payer: Medicare Other | Attending: Cardiology | Admitting: Cardiology

## 2020-06-30 VITALS — BP 112/74 | HR 76 | Ht 70.0 in | Wt 198.2 lb

## 2020-06-30 DIAGNOSIS — N1832 Chronic kidney disease, stage 3b: Secondary | ICD-10-CM | POA: Diagnosis present

## 2020-06-30 DIAGNOSIS — I200 Unstable angina: Secondary | ICD-10-CM

## 2020-06-30 DIAGNOSIS — N1831 Chronic kidney disease, stage 3a: Secondary | ICD-10-CM

## 2020-06-30 DIAGNOSIS — R079 Chest pain, unspecified: Secondary | ICD-10-CM | POA: Diagnosis not present

## 2020-06-30 DIAGNOSIS — N183 Chronic kidney disease, stage 3 unspecified: Secondary | ICD-10-CM

## 2020-06-30 DIAGNOSIS — R0602 Shortness of breath: Secondary | ICD-10-CM | POA: Diagnosis present

## 2020-06-30 DIAGNOSIS — I2510 Atherosclerotic heart disease of native coronary artery without angina pectoris: Secondary | ICD-10-CM | POA: Diagnosis present

## 2020-06-30 DIAGNOSIS — E785 Hyperlipidemia, unspecified: Secondary | ICD-10-CM | POA: Diagnosis present

## 2020-06-30 DIAGNOSIS — Z7982 Long term (current) use of aspirin: Secondary | ICD-10-CM

## 2020-06-30 DIAGNOSIS — I25110 Atherosclerotic heart disease of native coronary artery with unstable angina pectoris: Secondary | ICD-10-CM | POA: Diagnosis present

## 2020-06-30 DIAGNOSIS — I25119 Atherosclerotic heart disease of native coronary artery with unspecified angina pectoris: Secondary | ICD-10-CM | POA: Diagnosis not present

## 2020-06-30 DIAGNOSIS — Z905 Acquired absence of kidney: Secondary | ICD-10-CM

## 2020-06-30 DIAGNOSIS — Z955 Presence of coronary angioplasty implant and graft: Secondary | ICD-10-CM | POA: Diagnosis not present

## 2020-06-30 DIAGNOSIS — G4733 Obstructive sleep apnea (adult) (pediatric): Secondary | ICD-10-CM | POA: Diagnosis present

## 2020-06-30 DIAGNOSIS — Z20822 Contact with and (suspected) exposure to covid-19: Secondary | ICD-10-CM | POA: Diagnosis present

## 2020-06-30 DIAGNOSIS — I214 Non-ST elevation (NSTEMI) myocardial infarction: Principal | ICD-10-CM

## 2020-06-30 HISTORY — DX: Other specified health status: Z78.9

## 2020-06-30 LAB — COMPREHENSIVE METABOLIC PANEL
ALT: 19 U/L (ref 0–44)
AST: 27 U/L (ref 15–41)
Albumin: 3.7 g/dL (ref 3.5–5.0)
Alkaline Phosphatase: 58 U/L (ref 38–126)
Anion gap: 8 (ref 5–15)
BUN: 18 mg/dL (ref 8–23)
CO2: 25 mmol/L (ref 22–32)
Calcium: 9.5 mg/dL (ref 8.9–10.3)
Chloride: 106 mmol/L (ref 98–111)
Creatinine, Ser: 1.8 mg/dL — ABNORMAL HIGH (ref 0.61–1.24)
GFR calc Af Amer: 41 mL/min — ABNORMAL LOW (ref >60)
GFR calc non Af Amer: 36 mL/min — ABNORMAL LOW (ref >60)
Glucose, Bld: 95 mg/dL (ref 70–99)
Potassium: 4.2 mmol/L (ref 3.5–5.1)
Sodium: 139 mmol/L (ref 135–145)
Total Bilirubin: 0.6 mg/dL (ref 0.3–1.2)
Total Protein: 6.4 g/dL — ABNORMAL LOW (ref 6.5–8.1)

## 2020-06-30 LAB — APTT: aPTT: 34 seconds (ref 24–36)

## 2020-06-30 LAB — CBC WITH DIFFERENTIAL/PLATELET
Abs Immature Granulocytes: 0.02 10*3/uL (ref 0.00–0.07)
Basophils Absolute: 0.1 10*3/uL (ref 0.0–0.1)
Basophils Relative: 1 %
Eosinophils Absolute: 0.1 10*3/uL (ref 0.0–0.5)
Eosinophils Relative: 2 %
HCT: 41.6 % (ref 39.0–52.0)
Hemoglobin: 14 g/dL (ref 13.0–17.0)
Immature Granulocytes: 0 %
Lymphocytes Relative: 30 %
Lymphs Abs: 2 10*3/uL (ref 0.7–4.0)
MCH: 31.1 pg (ref 26.0–34.0)
MCHC: 33.7 g/dL (ref 30.0–36.0)
MCV: 92.4 fL (ref 80.0–100.0)
Monocytes Absolute: 0.6 10*3/uL (ref 0.1–1.0)
Monocytes Relative: 8 %
Neutro Abs: 3.9 10*3/uL (ref 1.7–7.7)
Neutrophils Relative %: 59 %
Platelets: 175 10*3/uL (ref 150–400)
RBC: 4.5 MIL/uL (ref 4.22–5.81)
RDW: 13.2 % (ref 11.5–15.5)
WBC: 6.7 10*3/uL (ref 4.0–10.5)
nRBC: 0 % (ref 0.0–0.2)

## 2020-06-30 LAB — SARS CORONAVIRUS 2 BY RT PCR (HOSPITAL ORDER, PERFORMED IN ~~LOC~~ HOSPITAL LAB): SARS Coronavirus 2: NEGATIVE

## 2020-06-30 LAB — PROTIME-INR
INR: 1 (ref 0.8–1.2)
Prothrombin Time: 12.8 seconds (ref 11.4–15.2)

## 2020-06-30 LAB — TROPONIN I (HIGH SENSITIVITY)
Troponin I (High Sensitivity): 463 ng/L (ref <18)
Troponin I (High Sensitivity): 526 ng/L (ref <18)

## 2020-06-30 LAB — TSH: TSH: 1.376 u[IU]/mL (ref 0.350–4.500)

## 2020-06-30 LAB — HEMOGLOBIN A1C
Hgb A1c MFr Bld: 5.7 % — ABNORMAL HIGH (ref 4.8–5.6)
Mean Plasma Glucose: 116.89 mg/dL

## 2020-06-30 LAB — MAGNESIUM: Magnesium: 1.9 mg/dL (ref 1.7–2.4)

## 2020-06-30 MED ORDER — NITROGLYCERIN 0.4 MG SL SUBL: 0.4000 mg | SUBLINGUAL_TABLET | SUBLINGUAL | Status: DC | PRN

## 2020-06-30 MED ORDER — DIPHENHYDRAMINE HCL 25 MG PO CAPS: 25.0000 mg | ORAL_CAPSULE | Freq: Four times a day (QID) | ORAL | Status: DC | PRN

## 2020-06-30 MED ORDER — SODIUM CHLORIDE 0.9 % IV SOLN: 4.0000 mg | Freq: Four times a day (QID) | INTRAVENOUS | Status: DC | PRN

## 2020-06-30 MED ORDER — HEPARIN (PORCINE) 25000 UT/250ML-% IV SOLN
1200.0000 [IU]/h | INTRAVENOUS | Status: DC
  Administered 2020-06-30: 1350 [IU]/h via INTRAVENOUS
  Administered 2020-07-01: 1200 [IU]/h via INTRAVENOUS
  Filled 2020-06-30 (×2): qty 250

## 2020-06-30 MED ORDER — ASPIRIN EC 81 MG PO TBEC
81.0000 mg | DELAYED_RELEASE_TABLET | Freq: Every day | ORAL | Status: DC
  Filled 2020-06-30: qty 1

## 2020-06-30 MED ORDER — SODIUM CHLORIDE 0.9 % WEIGHT BASED INFUSION
1.0000 mL/kg/h | INTRAVENOUS | Status: DC
  Administered 2020-06-30 – 2020-07-01 (×2): 1 mL/kg/h via INTRAVENOUS

## 2020-06-30 MED ORDER — SODIUM CHLORIDE 0.9% FLUSH: 3.0000 mL | INTRAVENOUS | Status: DC | PRN

## 2020-06-30 MED ORDER — SODIUM CHLORIDE 0.9 % IV SOLN: INTRAVENOUS | Status: DC

## 2020-06-30 MED ORDER — ACETAMINOPHEN 325 MG PO TABS: 650.0000 mg | ORAL_TABLET | ORAL | Status: DC | PRN

## 2020-06-30 MED ORDER — SODIUM CHLORIDE 0.9% FLUSH
3.0000 mL | Freq: Two times a day (BID) | INTRAVENOUS | Status: DC
  Administered 2020-06-30 – 2020-07-01 (×2): 3 mL via INTRAVENOUS

## 2020-06-30 MED ORDER — METOPROLOL SUCCINATE ER 25 MG PO TB24
25.0000 mg | ORAL_TABLET | Freq: Every day | ORAL | Status: DC
  Administered 2020-07-01 – 2020-07-05 (×5): 25 mg via ORAL
  Filled 2020-06-30 (×5): qty 1

## 2020-06-30 MED ORDER — DIPHENHYDRAMINE HCL 25 MG PO TABS
25.0000 mg | ORAL_TABLET | Freq: Four times a day (QID) | ORAL | Status: DC | PRN
  Filled 2020-06-30: qty 1

## 2020-06-30 MED ORDER — METOPROLOL SUCCINATE 12.5 MG HALF TABLET: 25.0000 mg | ORAL_TABLET | Freq: Every day | ORAL | Status: DC

## 2020-06-30 MED ORDER — ASPIRIN 81 MG PO CHEW
81.0000 mg | CHEWABLE_TABLET | ORAL | Status: AC
  Administered 2020-07-01: 81 mg via ORAL
  Filled 2020-06-30 (×2): qty 1

## 2020-06-30 MED ORDER — TAMSULOSIN HCL 0.4 MG PO CAPS
0.4000 mg | ORAL_CAPSULE | Freq: Every day | ORAL | Status: DC
  Administered 2020-07-01 – 2020-07-05 (×5): 0.4 mg via ORAL
  Filled 2020-06-30 (×5): qty 1

## 2020-06-30 MED ORDER — ONDANSETRON HCL 4 MG/2ML IJ SOLN: 4.0000 mg | Freq: Four times a day (QID) | INTRAMUSCULAR | Status: DC | PRN

## 2020-06-30 MED ORDER — ASPIRIN EC 81 MG PO TBEC: 81.0000 mg | DELAYED_RELEASE_TABLET | Freq: Every day | ORAL | Status: DC

## 2020-06-30 MED ORDER — HEPARIN BOLUS VIA INFUSION
4000.0000 [IU] | Freq: Once | INTRAVENOUS | Status: AC
  Administered 2020-06-30: 4000 [IU] via INTRAVENOUS
  Filled 2020-06-30: qty 4000

## 2020-06-30 MED ORDER — PANTOPRAZOLE SODIUM 40 MG PO TBEC
40.0000 mg | DELAYED_RELEASE_TABLET | Freq: Every day | ORAL | Status: DC
  Administered 2020-07-01 – 2020-07-05 (×5): 40 mg via ORAL
  Filled 2020-06-30 (×5): qty 1

## 2020-06-30 MED ORDER — ZOLPIDEM TARTRATE 5 MG PO TABS: 5.0000 mg | ORAL_TABLET | Freq: Every evening | ORAL | Status: DC | PRN

## 2020-06-30 MED ORDER — ASPIRIN EC 81 MG PO TBEC
325.0000 mg | DELAYED_RELEASE_TABLET | Freq: Once | ORAL | Status: AC
  Administered 2020-06-30: 325 mg via ORAL

## 2020-06-30 MED ORDER — SODIUM CHLORIDE 0.9 % IV SOLN: 250.0000 mL | INTRAVENOUS | Status: DC | PRN

## 2020-06-30 NOTE — Addendum Note (Signed)
Addended by: Carylon Perches on: 06/30/2020 02:38 PM   Modules accepted: Orders

## 2020-06-30 NOTE — Progress Notes (Signed)
CRITICAL VALUE ALERT  Critical Value:  Troponin 463  Date & Time Notied:  06/30/20 1905  Provider Notified: Cecilie Kicks, NP  Orders Received/Actions taken: Will review chart

## 2020-06-30 NOTE — Progress Notes (Signed)
Cardiology Office Note    Date:  06/30/2020   ID:  Sean Hammond, DOB 03-Sep-1941, MRN [id number]   Primary Cardiologist: Dr. [name]  Chief Complaint  Patient presents with  . Chest Pain    History of Present Illness:  Sean Hammond is a 79 y.o. male with history of CAD overlapping DES Cfx EF 44% 2013.  Repeat cath 04/2016 patent circumflex stent other coronaries normal, normal LVEF.  Hyperlipidemia intolerant to statins and declined PCSK9I, history of OSA not on CPAP, CKD stage 3-robotic lap right nephrectomy with ureterectomy 11/28/19.  Last saw Dr. [name] 01/07/20 and felt to have controlled angina with chronic complaints of fatigue.  Patient added onto my schedule today because of chest pain and shortness of breath.  CBC thyroid studies and LFTs normal 06/21/2020. Last Thurs when he stood up he got dizzy and then had mild chest tightness. When he walks from his church to house he had chest pressure like a softball in his chest lasted 30 min and eased. Didn't used NTG. Was trying to clean out cabinets and was carrying boxes and was very short of breath with chest tightness into shoulders and back. Short of breath with little activity and chest tightness walking in here today.   Past Medical History:  Diagnosis Date  . Allergic rhinitis   . BPH (benign prostatic hyperplasia)    urologist-- dr t. [name]  . CAD (coronary artery disease) cardiologsit--- dr [name]   a. 12-14-2011 s/p PCI with DES x2 to midLCFx // b. Myoview 7/15: inferior ischemia, EF 52%, low risk  //  c. LHC 6/17: LCx stents patent, no sig CAD elsewhere, EF 65% >> Med Rx  . Chronic headaches    due to sinues  . ED (erectile dysfunction)   . GERD (gastroesophageal reflux disease)   . Hematuria   . History of acute pyelonephritis    08-11-2019  . History of echocardiogram    last one 03-22-2015   mild LVH,   ef 55-60%  . History of kidney stones   . History of melanoma excision    non-malignant excised from ear  .  History of MI (myocardial infarction)    per pt remote hx yrs ago prior to 2013  . History of nonmelanoma skin cancer    per pt SCC and BCC from back, head, face  excision  . History of prostatitis   . Hydronephrosis, right   . Hyperlipidemia   . Moderate obstructive sleep apnea    not used cpap since 2014 bought bed that head would elevate   . Osteoarthritis of hand   . PONV (postoperative nausea and vomiting)    Pt may have been confused after surgery in the past  . Wears glasses     Past Surgical History:  Procedure Laterality Date  . BONE CYST EXCISION  ~ 2004   left knee  . CARDIAC CATHETERIZATION N/A 05/09/2016   Procedure: Left Heart Cath and Coronary Angiography;  Surgeon: Jerline Pain, MD;  Location: [facility];  Service: Cardiovascular;  Laterality: N/A;  . CHOLECYSTECTOMY N/A 03/23/2015   Procedure: LAPAROSCOPIC CHOLECYSTECTOMY;  Surgeon: Ralene Ok, MD;  Location: [facility];  Service: General;  Laterality: N/A;  . CORONARY ANGIOPLASTY WITH STENT PLACEMENT  12/14/11   "2"  . CYSTOSCOPY/RETROGRADE/URETEROSCOPY Bilateral 09/05/2019   Procedure: CYSTOSCOPY/ BILATERAL RETROGRADE/ RIGHT  URETEROSCOPY with biopsy with laser ablation of tumor;  Surgeon: Alexis Frock, MD;  Location: [facility];  Service:  Urology;  Laterality: Bilateral;  . ETHMOIDECTOMY Right 09/01/2019   Procedure: RIGHT REVISION ENDOSCOPIC ETHMOIDECTOMY;  Surgeon: Izora Gala, MD;  Location: [facility];  Service: ENT;  Laterality: Right;  . FRONTAL SINUSOTOMY Bilateral 07-23-2003   dr Constance Holster   re-do  . HOLMIUM LASER APPLICATION Right 09/62/8366   Procedure: HOLMIUM LASER APPLICATION;  Surgeon: Alexis Frock, MD;  Location: [facility];  Service: Urology;  Laterality: Right;  . KNEE ARTHROSCOPY Left ` 2010  . LEFT HEART CATHETERIZATION WITH CORONARY ANGIOGRAM N/A 12/14/2011   Procedure: LEFT HEART CATHETERIZATION WITH CORONARY ANGIOGRAM;  Surgeon: Candee Furbish, MD;  Location: [facility];  Service: Cardiovascular;  Laterality: N/A;  . PERCUTANEOUS CORONARY STENT INTERVENTION (PCI-S)  12/14/2011   Procedure: PERCUTANEOUS CORONARY STENT INTERVENTION (PCI-S);  Surgeon: Candee Furbish, MD;  Location: [facility];  Service: Cardiovascular;;  . ROBOT ASSITED LAPAROSCOPIC NEPHROURETERECTOMY Right 11/28/2019   Procedure: XI ROBOT ASSITED LAPAROSCOPIC NEPHROURETERECTOMY;  Surgeon: Alexis Frock, MD;  Location: [facility];  Service: Urology;  Laterality: Right;  3 HRS  . SEPTOPLASTY WITH ETHMOIDECTOMY, AND MAXILLARY ANTROSTOMY  09-22-2002   dr Constance Holster   and sinusotomy  . TOTAL KNEE ARTHROPLASTY Left 05/09/2017   Procedure: LEFT TOTAL KNEE ARTHROPLASTY;  Surgeon: Latanya Maudlin, MD;  Location: [facility];  Service: Orthopedics;  Laterality: Left;    Current Medications: Current Meds  Medication Sig  . Ascorbic Acid (VITAMIN C PO) Take by mouth.  Marland Kitchen aspirin EC 81 MG tablet Take 1 tablet (81 mg total) by mouth daily.  . diphenhydrAMINE (BENADRYL) 25 MG tablet Take 25 mg by mouth every 6 (six) hours as needed for allergies.   Marland Kitchen EPIPEN 2-PAK 0.3 MG/0.3ML SOAJ injection Inject 0.3 mg into the muscle once as needed (for an anaphylactic reaction).   Marland Kitchen ipratropium (ATROVENT) 0.06 % nasal spray Use 1-2 sprays in each nostril every 6 hours if needed to dry up nose.  . metoprolol succinate (TOPROL-XL) 25 MG 24 hr tablet TAKE 1 TABLET BY MOUTH EVERY DAY  . Multiple Vitamin (MULTIVITAMIN PO) Take by mouth.  . nitroGLYCERIN (NITROSTAT) 0.4 MG SL tablet PLACE 1 TABLET UNDER TONGUE EVERY 5 MINUTES AS NEEDED FOR CHEST PAIN MAX 3 TABS/15 MIN  . pantoprazole (PROTONIX) 40 MG tablet Take 1 tablet (40 mg total) by mouth daily after breakfast.  . tamsulosin (FLOMAX) 0.4 MG CAPS capsule Take 1 capsule (0.4 mg total) by mouth daily.  . traMADol (ULTRAM) 50 MG tablet Take by mouth every 6 (six) hours as needed.  Marland Kitchen VITAMIN D PO Take by mouth.   Current Facility-Administered Medications for  the 06/30/20 encounter (Office Visit) with Imogene Burn, PA-C  Medication  . 0.9 %  sodium chloride infusion  . metoprolol succinate (TOPROL-XL) 24 hr tablet 25 mg  . nitroGLYCERIN (NITROSTAT) SL tablet 0.4 mg  . ondansetron (ZOFRAN) 4 mg in sodium chloride 0.9 % 50 mL IVPB  . zolpidem (AMBIEN) tablet 5 mg     Allergies:   Beef-derived products, Pork-derived products, Codeine, Crestor [rosuvastatin], Dilaudid [hydromorphone hcl], Hydrocodone, Lipitor [atorvastatin], Percocet [oxycodone-acetaminophen], and Pitavastatin   Social History   Socioeconomic History  . Marital status: Married    Spouse name: Not on file  . Number of children: Not on file  . Years of education: Not on file  . Highest education level: Not on file  Occupational History  . Not on file  Tobacco Use  . Smoking status: Former Smoker    Packs/day: 2.00  Years: 12.00    Pack years: 24.00    Types: Cigarettes    Quit date: 09/03/1974    Years since quitting: 45.8  . Smokeless tobacco: Never Used  Vaping Use  . Vaping Use: Never used  Substance and Sexual Activity  . Alcohol use: Not Currently  . Drug use: Never  . Sexual activity: Not on file  Other Topics Concern  . Not on file  Social History Narrative  . Not on file   Social Determinants of Health   Financial Resource Strain:   . Difficulty of Paying Living Expenses:   Food Insecurity:   . Worried About Charity fundraiser in the Last Year:   . Arboriculturist in the Last Year:   Transportation Needs:   . Film/video editor (Medical):   Marland Kitchen Lack of Transportation (Non-Medical):   Physical Activity:   . Days of Exercise per Week:   . Minutes of Exercise per Session:   Stress:   . Feeling of Stress :   Social Connections:   . Frequency of Communication with Friends and Family:   . Frequency of Social Gatherings with Friends and Family:   . Attends Religious Services:   . Active Member of Clubs or Organizations:   . Attends Theatre manager Meetings:   Marland Kitchen Marital Status:      Family History:  The patient's   family history includes Heart attack in his mother; Hypertension in his mother.   ROS:   Please see the history of present illness.    ROS All other systems reviewed and are negative.   PHYSICAL EXAM:   VS:  BP 112/74   Pulse 76   Ht 5\' 10"  (1.778 m)   Wt 198 lb 3.2 oz (89.9 kg)   SpO2 97%   BMI 28.44 kg/m   Physical Exam  HEENT: normal GEN: Well nourished, well developed, in no acute distress  Neck: no JVD, carotid bruits, or masses Cardiac:RRR; no murmurs, rubs, or gallops  Respiratory:  clear to auscultation bilaterally, normal work of breathing GI: soft, nontender, nondistended, + BS Ext: without cyanosis, clubbing, or edema, Good distal pulses bilaterally Neuro:  Alert and Oriented x 3 Psych: euthymic mood, full affect  Wt Readings from Last 3 Encounters:  06/30/20 198 lb 3.2 oz (89.9 kg)  06/17/20 198 lb (89.8 kg)  01/07/20 201 lb (91.2 kg)      Studies/Labs Reviewed:   EKG:  EKG is  ordered today.  The ekg ordered today demonstrates normal sinus rhythm with PVC nonspecific ST-T wave changes anterior lateral  Recent Labs: 11/30/2019: BUN 18; Creatinine, Ser 1.78; Potassium 4.3; Sodium 137 06/21/2020: ALT 17; Hemoglobin 15.1; Platelets 184; TSH 2.000   Lipid Panel    Component Value Date/Time   CHOL 165 03/22/2015 0503   TRIG 73 03/22/2015 0503   HDL 45 03/22/2015 0503   CHOLHDL 3.7 03/22/2015 0503   VLDL 15 03/22/2015 0503   LDLCALC 105 (H) 03/22/2015 0503    Additional studies/ records that were reviewed today include:    Cardiac catheterization 05/09/2016  Widely patent circumflex stents previously placed  No other angiographically significant coronary artery disease present.  Normal left ventriculogram, no mitral regurgitation, no aortic stenosis, EF 65%   Candee Furbish, MD Continue with medical therapy, reassurance  2D echo 03/22/2015 Study Conclusions   - Left  ventricle: The cavity size was normal. Wall thickness was    increased in a pattern of mild LVH.  Systolic function was normal.    The estimated ejection fraction was in the range of 55% to 60%.    Wall motion was normal; there were no regional wall motion    abnormalities. Left ventricular diastolic function parameters    were normal.  - Pulmonary arteries: PA peak pressure: 33 mm Hg (S).   Transthoracic echocardiography.  M-mode, complete 2D, spectral  Doppler, and color Doppler.  Birthdate:  Patient birthdate:  [birth year].  Age:  Patient is 79 yr old.  Sex:  Gender: male.  BMI: 24.8 kg/m^2.  Blood pressure:     128/74  Patient status:  Inpatient.  Study date:  Study date: 03/22/2015. Study time: 10:25  AM.  Location:  Bedside.    ASSESSMENT:    1. Coronary artery disease involving native coronary artery of native heart with angina pectoris ([place])   2. Hyperlipidemia, unspecified hyperlipidemia type   3. Stage 3a chronic kidney disease   4. Unstable angina (HCC)      PLAN:  In order of problems listed above:  USAP-chest pain off and on for 3 weeks associated with activity and relieved with rest.  Had chest pain walking into the office today.  Discussed with Dr. [name] who concurs that he needs to be admitted to the hospital for unstable angina and will schedule cardiac catheterization tomorrow.  We will give IV fluids with CKD.  Creatinine 1.8 06/01/2020 I have reviewed the risks, indications, and alternatives to angioplasty and stenting with the patient. Risks include but are not limited to bleeding, infection, vascular injury, stroke, myocardial infection, arrhythmia, kidney injury, radiation-related injury in the case of prolonged fluoroscopy use, emergency cardiac surgery, and death. The patient understands the risks of serious complication is low (<2%) and patient agrees to proceed.     CAD status post overlapping stents to the circumflex 2013, cardiac cath 2017 patent stents  otherwise normal coronaries and normal LV function now with unstable angina  Hyperlipidemia with statin intolerance not on PCSK9 inhibitors  CKD stage III status post robotic assisted laparoscopic right nephrectomy with ureterectomy 11/28/2019 for renal mass.  Creatinine 1.8 06/01/2020    Medication Adjustments/Labs and Tests Ordered: Current medicines are reviewed at length with the patient today.  Concerns regarding medicines are outlined above.  Medication changes, Labs and Tests ordered today are listed in the Patient Instructions below. There are no Patient Instructions on file for this visit.   Signed, Ermalinda Barrios, PA-C  06/30/2020 2:25 PM    [facility] [place], [place], [place]  [address] Phone: [phone]; Fax: [phone]

## 2020-06-30 NOTE — H&P (Signed)
CAD history.  Prior circumflex stents. Prolonged chest pain with abnormal troponin I. CKD stage IIIa

## 2020-06-30 NOTE — Plan of Care (Signed)
  Problem: Education: Goal: Knowledge of General Education information will improve Description: Including pain rating scale, medication(s)/side effects and non-pharmacologic comfort measures Outcome: Progressing   Problem: Clinical Measurements: Goal: Will remain free from infection Outcome: Progressing   Problem: Activity: Goal: Risk for activity intolerance will decrease Outcome: Progressing   Problem: Coping: Goal: Level of anxiety will decrease Outcome: Completed/Met

## 2020-06-30 NOTE — Progress Notes (Signed)
ANTICOAGULATION CONSULT NOTE - Initial Consult  Pharmacy Consult for heparin Indication: chest pain/ACS  Allergies  Allergen Reactions  . Beef-Derived Products Anaphylaxis, Hives, Shortness Of Breath and Rash    Because of a tick bite, the patient now has "Alpha-gal allergy"  . Pork-Derived Products Anaphylaxis, Hives, Shortness Of Breath and Rash    Because of a tick bite, the patient now has "Alpha-gal allergy"  . Codeine Other (See Comments)    Surgeon advised against this  . Crestor [Rosuvastatin] Other (See Comments)    Muscle aches  . Dilaudid [Hydromorphone Hcl] Hives    All-over hives  . Hydrocodone Itching  . Lipitor [Atorvastatin] Other (See Comments)    Muscle aches   . Percocet [Oxycodone-Acetaminophen] Itching  . Pitavastatin Other (See Comments)    Leg pain    Patient Measurements: Height: 6\' 1"  (185.4 cm) Weight: 89.3 kg (196 lb 13.9 oz) IBW/kg (Calculated) : 79.9 Heparin Dosing Weight: 89kg  Vital Signs: Temp: 97.9 F (36.6 C) (08/11 1649) Temp Source: Oral (08/11 1649) BP: 159/84 (08/11 1649) Pulse Rate: 74 (08/11 1649)  Labs: No results for input(s): HGB, HCT, PLT, APTT, LABPROT, INR, HEPARINUNFRC, HEPRLOWMOCWT, CREATININE, CKTOTAL, CKMB, TROPONINIHS in the last 72 hours.  CrCl cannot be calculated (Patient's most recent lab result is older than the maximum 21 days allowed.).   Medical History: Past Medical History:  Diagnosis Date  . Allergic rhinitis   . BPH (benign prostatic hyperplasia)    urologist-- dr t. [name]  . CAD (coronary artery disease) cardiologsit--- dr [name]   a. 12-14-2011 s/p PCI with DES x2 to midLCFx // b. Myoview 7/15: inferior ischemia, EF 52%, low risk  //  c. LHC 6/17: LCx stents patent, no sig CAD elsewhere, EF 65% >> Med Rx  . Chronic headaches    due to sinues  . ED (erectile dysfunction)   . GERD (gastroesophageal reflux disease)   . Hematuria   . History of acute pyelonephritis    08-11-2019  . History of  echocardiogram    last one 03-22-2015   mild LVH,   ef 55-60%  . History of kidney stones   . History of melanoma excision    non-malignant excised from ear  . History of MI (myocardial infarction)    per pt remote hx yrs ago prior to 2013  . History of nonmelanoma skin cancer    per pt SCC and BCC from back, head, face  excision  . History of prostatitis   . Hydronephrosis, right   . Hyperlipidemia   . Moderate obstructive sleep apnea    not used cpap since 2014 bought bed that head would elevate   . Osteoarthritis of hand   . PONV (postoperative nausea and vomiting)    Pt may have been confused after surgery in the past  . Wears glasses     Medications:  Facility-Administered Medications Prior to Admission  Medication Dose Route Frequency Provider Last Rate Last Admin  . 0.9 %  sodium chloride infusion   Intravenous Continuous Imogene Burn, PA-C      . metoprolol succinate (TOPROL-XL) 24 hr tablet 25 mg  25 mg Oral Daily Ermalinda Barrios M, PA-C      . nitroGLYCERIN (NITROSTAT) SL tablet 0.4 mg  0.4 mg Sublingual Q5 Min x 3 PRN Imogene Burn, PA-C      . ondansetron (ZOFRAN) 4 mg in sodium chloride 0.9 % 50 mL IVPB  4 mg Intravenous Q6H PRN Imogene Burn, PA-C      .  zolpidem (AMBIEN) tablet 5 mg  5 mg Oral QHS PRN Imogene Burn, PA-C       Medications Prior to Admission  Medication Sig Dispense Refill Last Dose  . Ascorbic Acid (VITAMIN C PO) Take by mouth.     Marland Kitchen aspirin EC 81 MG tablet Take 1 tablet (81 mg total) by mouth daily. 90 tablet 3   . diphenhydrAMINE (BENADRYL) 25 MG tablet Take 25 mg by mouth every 6 (six) hours as needed for allergies.      Marland Kitchen EPIPEN 2-PAK 0.3 MG/0.3ML SOAJ injection Inject 0.3 mg into the muscle once as needed (for an anaphylactic reaction).      Marland Kitchen ipratropium (ATROVENT) 0.06 % nasal spray Use 1-2 sprays in each nostril every 6 hours if needed to dry up nose. 15 mL 5   . metoprolol succinate (TOPROL-XL) 25 MG 24 hr tablet TAKE 1 TABLET  BY MOUTH EVERY DAY 90 tablet 3   . Multiple Vitamin (MULTIVITAMIN PO) Take by mouth.     . nitroGLYCERIN (NITROSTAT) 0.4 MG SL tablet PLACE 1 TABLET UNDER TONGUE EVERY 5 MINUTES AS NEEDED FOR CHEST PAIN MAX 3 TABS/15 MIN 25 tablet 3   . pantoprazole (PROTONIX) 40 MG tablet Take 1 tablet (40 mg total) by mouth daily after breakfast. 90 tablet 2   . tamsulosin (FLOMAX) 0.4 MG CAPS capsule Take 1 capsule (0.4 mg total) by mouth daily. 30 capsule 0   . traMADol (ULTRAM) 50 MG tablet Take by mouth every 6 (six) hours as needed.     Marland Kitchen VITAMIN D PO Take by mouth.      Scheduled:  . aspirin EC  81 mg Oral Daily  . [START ON 07/01/2020] metoprolol succinate  25 mg Oral Daily  . [START ON 07/01/2020] pantoprazole  40 mg Oral QPC breakfast  . tamsulosin  0.4 mg Oral Daily   Infusions:    Assessment: Pt with a hx of CAD presented with CP. Plan for cath in AM. No anticoagulation PTA.  Scr 1.78 in 1/21. Hgb/plt wnl on 8/11 Current labs pending  Goal of Therapy:  Heparin level 0.3-0.7 units/ml Monitor platelets by anticoagulation protocol: Yes   Plan:  Heparin bolus 4000 units x1 Heparin infusion 1350 units/hr Heparin level in 8 hr Daily HL and CBC  Onnie Boer, PharmD, BCIDP, AAHIVP, CPP Infectious Disease Pharmacist 06/30/2020 5:51 PM

## 2020-06-30 NOTE — H&P (Signed)
Cardiology Office Note    Date:  06/30/2020   ID:  Sean Hammond, DOB 30-Mar-1941, MRN [id number]   Primary Cardiologist: Dr. [name]  Chief Complaint  Patient presents with  . Chest Pain    History of Present Illness:  Sean Hammond is a 79 y.o. male with history of CAD overlapping DES Cfx EF 44% 2013.  Repeat cath 04/2016 patent circumflex stent other coronaries normal, normal LVEF.  Hyperlipidemia intolerant to statins and declined PCSK9I, history of OSA not on CPAP, CKD stage 3-robotic lap right nephrectomy with ureterectomy 11/28/19.  Last saw Dr. [name] 01/07/20 and felt to have controlled angina with chronic complaints of fatigue.  Patient added onto my schedule today because of chest pain and shortness of breath.  CBC thyroid studies and LFTs normal 06/21/2020. Last Thurs when he stood up he got dizzy and then had mild chest tightness. When he walks from his church to house he had chest pressure like a softball in his chest lasted 30 min and eased. Didn't used NTG. Was trying to clean out cabinets and was carrying boxes and was very short of breath with chest tightness into shoulders and back. Short of breath with little activity and chest tightness walking in here today.   Past Medical History:  Diagnosis Date  . Allergic rhinitis   . BPH (benign prostatic hyperplasia)    urologist-- dr t. [name]  . CAD (coronary artery disease) cardiologsit--- dr [name]   a. 12-14-2011 s/p PCI with DES x2 to midLCFx // b. Myoview 7/15: inferior ischemia, EF 52%, low risk  //  c. LHC 6/17: LCx stents patent, no sig CAD elsewhere, EF 65% >> Med Rx  . Chronic headaches    due to sinues  . ED (erectile dysfunction)   . GERD (gastroesophageal reflux disease)   . Hematuria   . History of acute pyelonephritis    08-11-2019  . History of echocardiogram    last one 03-22-2015   mild LVH,   ef 55-60%  . History of kidney stones   . History of melanoma excision    non-malignant excised from ear  .  History of MI (myocardial infarction)    per pt remote hx yrs ago prior to 2013  . History of nonmelanoma skin cancer    per pt SCC and BCC from back, head, face  excision  . History of prostatitis   . Hydronephrosis, right   . Hyperlipidemia   . Moderate obstructive sleep apnea    not used cpap since 2014 bought bed that head would elevate   . Osteoarthritis of hand   . PONV (postoperative nausea and vomiting)    Pt may have been confused after surgery in the past  . Wears glasses     Past Surgical History:  Procedure Laterality Date  . BONE CYST EXCISION  ~ 2004   left knee  . CARDIAC CATHETERIZATION N/A 05/09/2016   Procedure: Left Heart Cath and Coronary Angiography;  Surgeon: Jerline Pain, MD;  Location: [place] [facility];  Service: Cardiovascular;  Laterality: N/A;  . CHOLECYSTECTOMY N/A 03/23/2015   Procedure: LAPAROSCOPIC CHOLECYSTECTOMY;  Surgeon: Ralene Ok, MD;  Location: [facility];  Service: General;  Laterality: N/A;  . CORONARY ANGIOPLASTY WITH STENT PLACEMENT  12/14/11   "2"  . CYSTOSCOPY/RETROGRADE/URETEROSCOPY Bilateral 09/05/2019   Procedure: CYSTOSCOPY/ BILATERAL RETROGRADE/ RIGHT  URETEROSCOPY with biopsy with laser ablation of tumor;  Surgeon: Alexis Frock, MD;  Location: [facility];  Service:  Urology;  Laterality: Bilateral;  . ETHMOIDECTOMY Right 09/01/2019   Procedure: RIGHT REVISION ENDOSCOPIC ETHMOIDECTOMY;  Surgeon: Izora Gala, MD;  Location: [place];  Service: ENT;  Laterality: Right;  . FRONTAL SINUSOTOMY Bilateral 07-23-2003   dr Constance Holster   re-do  . HOLMIUM LASER APPLICATION Right 32/95/1884   Procedure: HOLMIUM LASER APPLICATION;  Surgeon: Alexis Frock, MD;  Location: [facility];  Service: Urology;  Laterality: Right;  . KNEE ARTHROSCOPY Left ` 2010  . LEFT HEART CATHETERIZATION WITH CORONARY ANGIOGRAM N/A 12/14/2011   Procedure: LEFT HEART CATHETERIZATION WITH CORONARY ANGIOGRAM;  Surgeon: Candee Furbish, MD;  Location: [facility];  Service: Cardiovascular;  Laterality: N/A;  . PERCUTANEOUS CORONARY STENT INTERVENTION (PCI-S)  12/14/2011   Procedure: PERCUTANEOUS CORONARY STENT INTERVENTION (PCI-S);  Surgeon: Candee Furbish, MD;  Location: [facility] C[facility];  Service: Cardiovascular;;  . ROBOT ASSITED LAPAROSCOPIC NEPHROURETERECTOMY Right 11/28/2019   Procedure: XI ROBOT ASSITED LAPAROSCOPIC NEPHROURETERECTOMY;  Surgeon: Alexis Frock, MD;  Location: [facility];  Service: Urology;  Laterality: Right;  3 HRS  . SEPTOPLASTY WITH ETHMOIDECTOMY, AND MAXILLARY ANTROSTOMY  09-22-2002   dr Constance Holster   and sinusotomy  . TOTAL KNEE ARTHROPLASTY Left 05/09/2017   Procedure: LEFT TOTAL KNEE ARTHROPLASTY;  Surgeon: Latanya Maudlin, MD;  Location: [facility];  Service: Orthopedics;  Laterality: Left;    Current Medications: Current Meds  Medication Sig  . Ascorbic Acid (VITAMIN C PO) Take by mouth.  Marland Kitchen aspirin EC 81 MG tablet Take 1 tablet (81 mg total) by mouth daily.  . diphenhydrAMINE (BENADRYL) 25 MG tablet Take 25 mg by mouth every 6 (six) hours as needed for allergies.   Marland Kitchen EPIPEN 2-PAK 0.3 MG/0.3ML SOAJ injection Inject 0.3 mg into the muscle once as needed (for an anaphylactic reaction).   Marland Kitchen ipratropium (ATROVENT) 0.06 % nasal spray Use 1-2 sprays in each nostril every 6 hours if needed to dry up nose.  . metoprolol succinate (TOPROL-XL) 25 MG 24 hr tablet TAKE 1 TABLET BY MOUTH EVERY DAY  . Multiple Vitamin (MULTIVITAMIN PO) Take by mouth.  . nitroGLYCERIN (NITROSTAT) 0.4 MG SL tablet PLACE 1 TABLET UNDER TONGUE EVERY 5 MINUTES AS NEEDED FOR CHEST PAIN MAX 3 TABS/15 MIN  . pantoprazole (PROTONIX) 40 MG tablet Take 1 tablet (40 mg total) by mouth daily after breakfast.  . tamsulosin (FLOMAX) 0.4 MG CAPS capsule Take 1 capsule (0.4 mg total) by mouth daily.  . traMADol (ULTRAM) 50 MG tablet Take by mouth every 6 (six) hours as needed.  Marland Kitchen VITAMIN D PO Take by mouth.     Allergies:   Beef-derived products,  Pork-derived products, Codeine, Crestor [rosuvastatin], Dilaudid [hydromorphone hcl], Hydrocodone, Lipitor [atorvastatin], Percocet [oxycodone-acetaminophen], and Pitavastatin   Social History   Socioeconomic History  . Marital status: Married    Spouse name: Not on file  . Number of children: Not on file  . Years of education: Not on file  . Highest education level: Not on file  Occupational History  . Not on file  Tobacco Use  . Smoking status: Former Smoker    Packs/day: 2.00    Years: 12.00    Pack years: 24.00    Types: Cigarettes    Quit date: 09/03/1974    Years since quitting: 45.8  . Smokeless tobacco: Never Used  Vaping Use  . Vaping Use: Never used  Substance and Sexual Activity  . Alcohol use: Not Currently  . Drug use: Never  . Sexual activity: Not on  file  Other Topics Concern  . Not on file  Social History Narrative  . Not on file   Social Determinants of Health   Financial Resource Strain:   . Difficulty of Paying Living Expenses:   Food Insecurity:   . Worried About Charity fundraiser in the Last Year:   . Arboriculturist in the Last Year:   Transportation Needs:   . Film/video editor (Medical):   Marland Kitchen Lack of Transportation (Non-Medical):   Physical Activity:   . Days of Exercise per Week:   . Minutes of Exercise per Session:   Stress:   . Feeling of Stress :   Social Connections:   . Frequency of Communication with Friends and Family:   . Frequency of Social Gatherings with Friends and Family:   . Attends Religious Services:   . Active Member of Clubs or Organizations:   . Attends Archivist Meetings:   Marland Kitchen Marital Status:      Family History:  The patient's   family history includes Heart attack in his mother; Hypertension in his mother.   ROS:   Please see the history of present illness.    ROS All other systems reviewed and are negative.   PHYSICAL EXAM:   VS:  BP 112/74   Pulse 76   Ht 5\' 10"  (1.778 m)   Wt 198 lb  3.2 oz (89.9 kg)   SpO2 97%   BMI 28.44 kg/m   Physical Exam  HEENT: normal GEN: Well nourished, well developed, in no acute distress  Neck: no JVD, carotid bruits, or masses Cardiac:RRR; no murmurs, rubs, or gallops  Respiratory:  clear to auscultation bilaterally, normal work of breathing GI: soft, nontender, nondistended, + BS Ext: without cyanosis, clubbing, or edema, Good distal pulses bilaterally Neuro:  Alert and Oriented x 3 Psych: euthymic mood, full affect  Wt Readings from Last 3 Encounters:  06/30/20 198 lb 3.2 oz (89.9 kg)  06/17/20 198 lb (89.8 kg)  01/07/20 201 lb (91.2 kg)      Studies/Labs Reviewed:   EKG:  EKG is  ordered today.  The ekg ordered today demonstrates normal sinus rhythm with PVC nonspecific ST-T wave changes anterior lateral  Recent Labs: 11/30/2019: BUN 18; Creatinine, Ser 1.78; Potassium 4.3; Sodium 137 06/21/2020: ALT 17; Hemoglobin 15.1; Platelets 184; TSH 2.000   Lipid Panel    Component Value Date/Time   CHOL 165 03/22/2015 0503   TRIG 73 03/22/2015 0503   HDL 45 03/22/2015 0503   CHOLHDL 3.7 03/22/2015 0503   VLDL 15 03/22/2015 0503   LDLCALC 105 (H) 03/22/2015 0503    Additional studies/ records that were reviewed today include:    Cardiac catheterization 05/09/2016  Widely patent circumflex stents previously placed  No other angiographically significant coronary artery disease present.  Normal left ventriculogram, no mitral regurgitation, no aortic stenosis, EF 65%   Candee Furbish, MD Continue with medical therapy, reassurance  2D echo 03/22/2015 Study Conclusions   - Left ventricle: The cavity size was normal. Wall thickness was    increased in a pattern of mild LVH. Systolic function was normal.    The estimated ejection fraction was in the range of 55% to 60%.    Wall motion was normal; there were no regional wall motion    abnormalities. Left ventricular diastolic function parameters    were normal.  - Pulmonary  arteries: PA peak pressure: 33 mm Hg (S).   Transthoracic echocardiography.  M-mode,  complete 2D, spectral  Doppler, and color Doppler.  Birthdate:  Patient birthdate:  1940/11/29.  Age:  Patient is 79 yr old.  Sex:  Gender: male.  BMI: 24.8 kg/m^2.  Blood pressure:     128/74  Patient status:  Inpatient.  Study date:  Study date: 03/22/2015. Study time: 10:25  AM.  Location:  Bedside.    ASSESSMENT:    1. Coronary artery disease involving native coronary artery of native heart with angina pectoris ([place])   2. Hyperlipidemia, unspecified hyperlipidemia type   3. Stage 3a chronic kidney disease      PLAN:  In order of problems listed above:  USAP-chest pain off and on for 3 weeks associated with activity and relieved with rest.  Had chest pain walking into the office today.  Discussed with Dr. [name] who concurs that he needs to be admitted to the hospital for unstable angina and will schedule cardiac catheterization tomorrow.  We will give IV fluids with CKD.  Creatinine 1.8 06/01/2020 I have reviewed the risks, indications, and alternatives to angioplasty and stenting with the patient. Risks include but are not limited to bleeding, infection, vascular injury, stroke, myocardial infection, arrhythmia, kidney injury, radiation-related injury in the case of prolonged fluoroscopy use, emergency cardiac surgery, and death. The patient understands the risks of serious complication is low (<9%) and patient agrees to proceed.     CAD status post overlapping stents to the circumflex 2013, cardiac cath 2017 patent stents otherwise normal coronaries and normal LV function now with unstable angina  Hyperlipidemia with statin intolerance not on PCSK9 inhibitors  CKD stage III status post robotic assisted laparoscopic right nephrectomy with ureterectomy 11/28/2019 for renal mass.  Creatinine 1.8 06/01/2020    Medication Adjustments/Labs and Tests Ordered: Current medicines are reviewed at length  with the patient today.  Concerns regarding medicines are outlined above.  Medication changes, Labs and Tests ordered today are listed in the Patient Instructions below. There are no Patient Instructions on file for this visit.   Sumner Boast, PA-C  06/30/2020 1:56 PM    [facility] [address], [place], [place]  [address] Phone: [phone]; Fax: [phone]

## 2020-07-01 ENCOUNTER — Encounter (HOSPITAL_COMMUNITY)
Admission: AD | Disposition: A | Discharge status: Home / Self Care | Payer: Self-pay | Source: Home / Self Care | Attending: Cardiology

## 2020-07-01 ENCOUNTER — Ambulatory Visit (HOSPITAL_COMMUNITY)
Admission: RE | Admit: 2020-07-01 | Payer: Medicare Other | Source: Home / Self Care | Admitting: Interventional Cardiology

## 2020-07-01 DIAGNOSIS — I25110 Atherosclerotic heart disease of native coronary artery with unstable angina pectoris: Secondary | ICD-10-CM | POA: Diagnosis not present

## 2020-07-01 DIAGNOSIS — N1831 Chronic kidney disease, stage 3a: Secondary | ICD-10-CM | POA: Diagnosis not present

## 2020-07-01 DIAGNOSIS — E785 Hyperlipidemia, unspecified: Secondary | ICD-10-CM

## 2020-07-01 DIAGNOSIS — I200 Unstable angina: Secondary | ICD-10-CM

## 2020-07-01 HISTORY — PX: LEFT HEART CATH AND CORONARY ANGIOGRAPHY: CATH118249

## 2020-07-01 HISTORY — PX: CORONARY STENT INTERVENTION: CATH118234

## 2020-07-01 LAB — CBC
HCT: 38.5 % — ABNORMAL LOW (ref 39.0–52.0)
Hemoglobin: 13 g/dL (ref 13.0–17.0)
MCH: 30.9 pg (ref 26.0–34.0)
MCHC: 33.8 g/dL (ref 30.0–36.0)
MCV: 91.4 fL (ref 80.0–100.0)
Platelets: 154 10*3/uL (ref 150–400)
RBC: 4.21 MIL/uL — ABNORMAL LOW (ref 4.22–5.81)
RDW: 13.4 % (ref 11.5–15.5)
WBC: 5.7 10*3/uL (ref 4.0–10.5)
nRBC: 0 % (ref 0.0–0.2)

## 2020-07-01 LAB — BASIC METABOLIC PANEL
Anion gap: 9 (ref 5–15)
BUN: 20 mg/dL (ref 8–23)
CO2: 22 mmol/L (ref 22–32)
Calcium: 9.1 mg/dL (ref 8.9–10.3)
Chloride: 109 mmol/L (ref 98–111)
Creatinine, Ser: 1.71 mg/dL — ABNORMAL HIGH (ref 0.61–1.24)
GFR calc Af Amer: 44 mL/min — ABNORMAL LOW (ref >60)
GFR calc non Af Amer: 38 mL/min — ABNORMAL LOW (ref >60)
Glucose, Bld: 104 mg/dL — ABNORMAL HIGH (ref 70–99)
Potassium: 4.7 mmol/L (ref 3.5–5.1)
Sodium: 140 mmol/L (ref 135–145)

## 2020-07-01 LAB — LIPID PANEL
Cholesterol: 158 mg/dL (ref 0–200)
HDL: 44 mg/dL (ref >40)
LDL Cholesterol: 104 mg/dL — ABNORMAL HIGH (ref 0–99)
Total CHOL/HDL Ratio: 3.6 RATIO
Triglycerides: 49 mg/dL (ref <150)
VLDL: 10 mg/dL (ref 0–40)

## 2020-07-01 LAB — POCT ACTIVATED CLOTTING TIME: Activated Clotting Time: 290 seconds

## 2020-07-01 LAB — HEPARIN LEVEL (UNFRACTIONATED)
Heparin Unfractionated: 0.79 IU/mL — ABNORMAL HIGH (ref 0.30–0.70)
Heparin Unfractionated: 0.81 IU/mL — ABNORMAL HIGH (ref 0.30–0.70)

## 2020-07-01 SURGERY — LEFT HEART CATH AND CORONARY ANGIOGRAPHY
Anesthesia: LOCAL

## 2020-07-01 MED ORDER — LABETALOL HCL 5 MG/ML IV SOLN: 10.0000 mg | INTRAVENOUS | Status: AC | PRN

## 2020-07-01 MED ORDER — ASPIRIN 81 MG PO CHEW
81.0000 mg | CHEWABLE_TABLET | Freq: Every day | ORAL | Status: DC
  Administered 2020-07-02 – 2020-07-04 (×3): 81 mg via ORAL
  Filled 2020-07-01 (×3): qty 1

## 2020-07-01 MED ORDER — CLOPIDOGREL BISULFATE 300 MG PO TABS
ORAL_TABLET | ORAL | Status: DC | PRN
  Administered 2020-07-01: 600 mg via ORAL

## 2020-07-01 MED ORDER — HYDRALAZINE HCL 20 MG/ML IJ SOLN: 10.0000 mg | INTRAMUSCULAR | Status: AC | PRN

## 2020-07-01 MED ORDER — SODIUM CHLORIDE 0.9 % WEIGHT BASED INFUSION: 1.0000 mL/kg/h | INTRAVENOUS | Status: DC

## 2020-07-01 MED ORDER — SODIUM CHLORIDE 0.9% FLUSH
3.0000 mL | Freq: Two times a day (BID) | INTRAVENOUS | Status: DC
  Administered 2020-07-02 – 2020-07-04 (×4): 3 mL via INTRAVENOUS

## 2020-07-01 MED ORDER — HEPARIN SODIUM (PORCINE) 1000 UNIT/ML IJ SOLN
INTRAMUSCULAR | Status: DC | PRN
  Administered 2020-07-01: 7500 [IU] via INTRAVENOUS
  Administered 2020-07-01: 2500 [IU] via INTRAVENOUS
  Administered 2020-07-01: 5000 [IU] via INTRAVENOUS

## 2020-07-01 MED ORDER — SODIUM CHLORIDE 0.9% FLUSH: 3.0000 mL | INTRAVENOUS | Status: DC | PRN

## 2020-07-01 MED ORDER — SODIUM CHLORIDE 0.9 % IV SOLN: 250.0000 mL | INTRAVENOUS | Status: DC | PRN

## 2020-07-01 MED ORDER — MIDAZOLAM HCL 2 MG/2ML IJ SOLN
INTRAMUSCULAR | Status: DC | PRN
  Administered 2020-07-01 (×2): 1 mg via INTRAVENOUS

## 2020-07-01 MED ORDER — HEPARIN (PORCINE) IN NACL 1000-0.9 UT/500ML-% IV SOLN
INTRAVENOUS | Status: DC | PRN
  Administered 2020-07-01 (×2): 500 mL

## 2020-07-01 MED ORDER — IOHEXOL 350 MG/ML SOLN
INTRAVENOUS | Status: DC | PRN
  Administered 2020-07-01: 98 mL

## 2020-07-01 MED ORDER — ACETAMINOPHEN 325 MG PO TABS
650.0000 mg | ORAL_TABLET | ORAL | Status: DC | PRN
  Administered 2020-07-02 – 2020-07-04 (×2): 650 mg via ORAL
  Filled 2020-07-01 (×2): qty 2

## 2020-07-01 MED ORDER — LIDOCAINE HCL (PF) 1 % IJ SOLN
INTRAMUSCULAR | Status: DC | PRN
  Administered 2020-07-01: 2 mL

## 2020-07-01 MED ORDER — NITROGLYCERIN 1 MG/10 ML FOR IR/CATH LAB
INTRA_ARTERIAL | Status: DC | PRN
  Administered 2020-07-01: 200 ug via INTRACORONARY
  Administered 2020-07-01: 200 ug
  Administered 2020-07-01: 200 ug via INTRACORONARY

## 2020-07-01 MED ORDER — VERAPAMIL HCL 2.5 MG/ML IV SOLN: INTRAVENOUS | Status: DC | PRN

## 2020-07-01 MED ORDER — ONDANSETRON HCL 4 MG/2ML IJ SOLN: 4.0000 mg | Freq: Four times a day (QID) | INTRAMUSCULAR | Status: DC | PRN

## 2020-07-01 MED ORDER — FENTANYL CITRATE (PF) 100 MCG/2ML IJ SOLN
INTRAMUSCULAR | Status: DC | PRN
  Administered 2020-07-01 (×2): 25 ug via INTRAVENOUS

## 2020-07-01 MED ORDER — CLOPIDOGREL BISULFATE 75 MG PO TABS
75.0000 mg | ORAL_TABLET | Freq: Every day | ORAL | Status: DC
  Administered 2020-07-02 – 2020-07-05 (×4): 75 mg via ORAL
  Filled 2020-07-01 (×4): qty 1

## 2020-07-01 MED ORDER — HEPARIN SODIUM (PORCINE) 5000 UNIT/ML IJ SOLN
5000.0000 [IU] | Freq: Three times a day (TID) | INTRAMUSCULAR | Status: DC
  Administered 2020-07-02 – 2020-07-04 (×8): 5000 [IU] via SUBCUTANEOUS
  Filled 2020-07-01 (×10): qty 1

## 2020-07-01 SURGICAL SUPPLY — 19 items
BALLN EUPHORA RX 2.5X20 (BALLOONS) ×2
BALLN ~~LOC~~ EUPHORA RX 3.0X15 (BALLOONS) ×2
BALLOON EUPHORA RX 2.5X20 (BALLOONS) IMPLANT
BALLOON ~~LOC~~ EUPHORA RX 3.0X15 (BALLOONS) IMPLANT
CATH 5FR JL3.5 JR4 ANG PIG MP (CATHETERS) ×1 IMPLANT
CATH VISTA GUIDE 6FR JR4 (CATHETERS) ×1 IMPLANT
DEVICE RAD COMP TR BAND LRG (VASCULAR PRODUCTS) ×1 IMPLANT
GLIDESHEATH SLEND A-KIT 6F 22G (SHEATH) ×1 IMPLANT
GLIDESHEATH SLEND SS 6F .021 (SHEATH) IMPLANT
GUIDEWIRE INQWIRE 1.5J.035X260 (WIRE) IMPLANT
INQWIRE 1.5J .035X260CM (WIRE) ×2
KIT ENCORE 26 ADVANTAGE (KITS) ×1 IMPLANT
KIT HEART LEFT (KITS) ×2 IMPLANT
PACK CARDIAC CATHETERIZATION (CUSTOM PROCEDURE TRAY) ×2 IMPLANT
SHEATH PROBE COVER 6X72 (BAG) ×1 IMPLANT
STENT RESOLUTE ONYX 2.75X30 (Permanent Stent) ×1 IMPLANT
TRANSDUCER W/STOPCOCK (MISCELLANEOUS) ×2 IMPLANT
TUBING CIL FLEX 10 FLL-RA (TUBING) ×2 IMPLANT
WIRE ASAHI PROWATER 180CM (WIRE) ×1 IMPLANT

## 2020-07-01 NOTE — Progress Notes (Signed)
[place] for heparin Indication: chest pain/ACS  Allergies  Allergen Reactions  . Beef-Derived Products Anaphylaxis, Hives, Shortness Of Breath and Rash    Because of a tick bite, the patient now has "Alpha-gal allergy"  . Pork-Derived Products Anaphylaxis, Hives, Shortness Of Breath and Rash    Because of a tick bite, the patient now has "Alpha-gal allergy"  . Codeine Other (See Comments)    Surgeon advised against this  . Crestor [Rosuvastatin] Other (See Comments)    Muscle aches  . Dilaudid [Hydromorphone Hcl] Hives    All-over hives  . Hydrocodone Itching  . Lipitor [Atorvastatin] Other (See Comments)    Muscle aches   . Percocet [Oxycodone-Acetaminophen] Itching  . Pitavastatin Other (See Comments)    Leg pain    Patient Measurements: Height: 6\' 1"  (185.4 cm) Weight: 89.3 kg (196 lb 13.9 oz) IBW/kg (Calculated) : 79.9 Heparin Dosing Weight: 89kg  Vital Signs: Temp: 98 F (36.7 C) (08/12 0056) Temp Source: Oral (08/12 0056) BP: 131/94 (08/12 0056) Pulse Rate: 60 (08/12 0056)  Labs: Recent Labs    06/30/20 1752 06/30/20 1919 07/01/20 0301  HGB 14.0  --  13.0  HCT 41.6  --  38.5*  PLT 175  --  154  APTT 34  --   --   LABPROT 12.8  --   --   INR 1.0  --   --   HEPARINUNFRC  --   --  0.79*  CREATININE 1.80*  --   --   TROPONINIHS 463* 526*  --     Estimated Creatinine Clearance: 38.8 mL/min (A) (by C-G formula based on SCr of 1.8 mg/dL (H)).   Medical History: Past Medical History:  Diagnosis Date  . Allergic rhinitis   . BPH (benign prostatic hyperplasia)    urologist-- dr t. [name]  . CAD (coronary artery disease) cardiologsit--- dr [name]   a. 12-14-2011 s/p PCI with DES x2 to midLCFx // b. Myoview 7/15: inferior ischemia, EF 52%, low risk  //  c. LHC 6/17: LCx stents patent, no sig CAD elsewhere, EF 65% >> Med Rx  . Chronic headaches    due to sinues  . ED (erectile dysfunction)   . GERD (gastroesophageal  reflux disease)   . Hematuria   . History of acute pyelonephritis    08-11-2019  . History of echocardiogram    last one 03-22-2015   mild LVH,   ef 55-60%  . History of kidney stones   . History of melanoma excision    non-malignant excised from ear  . History of MI (myocardial infarction)    per pt remote hx yrs ago prior to 2013  . History of nonmelanoma skin cancer    per pt SCC and BCC from back, head, face  excision  . History of prostatitis   . Hydronephrosis, right   . Hyperlipidemia   . Moderate obstructive sleep apnea    not used cpap since 2014 bought bed that head would elevate   . Osteoarthritis of hand   . PONV (postoperative nausea and vomiting)    Pt may have been confused after surgery in the past  . Wears glasses     Medications:  Facility-Administered Medications Prior to Admission  Medication Dose Route Frequency Provider Last Rate Last Admin  . 0.9 %  sodium chloride infusion   Intravenous Continuous Imogene Burn, PA-C      . metoprolol succinate (TOPROL-XL) 24 hr tablet 25 mg  25 mg Oral Daily Ermalinda Barrios M, PA-C      . nitroGLYCERIN (NITROSTAT) SL tablet 0.4 mg  0.4 mg Sublingual Q5 Min x 3 PRN Ermalinda Barrios M, PA-C      . ondansetron [facility]) 4 mg in sodium chloride 0.9 % 50 mL IVPB  4 mg Intravenous Q6H PRN Imogene Burn, PA-C      . zolpidem (AMBIEN) tablet 5 mg  5 mg Oral QHS PRN Imogene Burn, PA-C       Medications Prior to Admission  Medication Sig Dispense Refill Last Dose  . Ascorbic Acid (VITAMIN C PO) Take 1 tablet by mouth daily.    06/30/2020 at Unknown time  . aspirin EC 81 MG tablet Take 1 tablet (81 mg total) by mouth daily. 90 tablet 3 06/30/2020 at Unknown time  . diphenhydrAMINE (BENADRYL) 25 MG tablet Take 25 mg by mouth every 6 (six) hours as needed for allergies.    unknown  . EPIPEN 2-PAK 0.3 MG/0.3ML SOAJ injection Inject 0.3 mg into the muscle once as needed (for an anaphylactic reaction).    unknown  . metoprolol  succinate (TOPROL-XL) 25 MG 24 hr tablet TAKE 1 TABLET BY MOUTH EVERY DAY (Patient taking differently: Take 25 mg by mouth daily. ) 90 tablet 3 06/30/2020 at 0800  . Multiple Vitamin (MULTIVITAMIN PO) Take 1 tablet by mouth daily.    06/30/2020 at Unknown time  . nitroGLYCERIN (NITROSTAT) 0.4 MG SL tablet PLACE 1 TABLET UNDER TONGUE EVERY 5 MINUTES AS NEEDED FOR CHEST PAIN MAX 3 TABS/15 MIN (Patient taking differently: Place 0.4 mg under the tongue every 5 (five) minutes as needed for chest pain. ) 25 tablet 3 unknown  . pantoprazole (PROTONIX) 40 MG tablet Take 1 tablet (40 mg total) by mouth daily after breakfast. 90 tablet 2 06/30/2020 at Unknown time  . tamsulosin (FLOMAX) 0.4 MG CAPS capsule Take 1 capsule (0.4 mg total) by mouth daily. 30 capsule 0 06/30/2020 at Unknown time  . traMADol (ULTRAM) 50 MG tablet Take 50 mg by mouth every 6 (six) hours as needed (pain).    Past Week at Unknown time  . VITAMIN D PO Take 1 tablet by mouth daily.    06/30/2020 at Unknown time  . ipratropium (ATROVENT) 0.06 % nasal spray Use 1-2 sprays in each nostril every 6 hours if needed to dry up nose. (Patient not taking: Reported on 06/30/2020) 15 mL 5 Not Taking at Unknown time   Scheduled:  . aspirin  81 mg Oral Pre-Cath  . aspirin EC  81 mg Oral Daily  . metoprolol succinate  25 mg Oral Daily  . pantoprazole  40 mg Oral QPC breakfast  . sodium chloride flush  3 mL Intravenous Q12H  . tamsulosin  0.4 mg Oral Daily   Infusions:  . sodium chloride    . sodium chloride 1 mL/kg/hr (06/30/20 3762)  . heparin 1,350 Units/hr (06/30/20 1900)    Assessment: Pt with a hx of CAD presented with CP. Plan for cath in AM. No anticoagulation PTA.  8/12 AM update:  Heparin level elevated   Goal of Therapy:  Heparin level 0.3-0.7 units/ml Monitor platelets by anticoagulation protocol: Yes   Plan:  Dec heparin to 1200 units/hr 1200 heparin level  Narda Bonds, PharmD, [place] Pharmacist Phone:  [phone]

## 2020-07-01 NOTE — Progress Notes (Addendum)
Progress Note  Patient Name: Sean Hammond Date of Encounter: 07/01/2020  [facility] Cardiologist: Candee Furbish, MD   Subjective   Plan for cardiac cath today. Creatinine 1.71 after hydration. Had a twinge of chest pain this morning. Breathing stable.   Inpatient Medications    Scheduled Meds: . aspirin EC  81 mg Oral Daily  . metoprolol succinate  25 mg Oral Daily  . pantoprazole  40 mg Oral QPC breakfast  . sodium chloride flush  3 mL Intravenous Q12H  . tamsulosin  0.4 mg Oral Daily   Continuous Infusions: . sodium chloride    . sodium chloride 1 mL/kg/hr (07/01/20 0843)  . heparin 1,200 Units/hr (07/01/20 0845)   PRN Meds: sodium chloride, acetaminophen, diphenhydrAMINE, nitroGLYCERIN, ondansetron (ZOFRAN) IV, sodium chloride flush   Vital Signs    Vitals:   06/30/20 2005 07/01/20 0056 07/01/20 0421 07/01/20 0750  BP: 103/65 (!) 131/94 (!) 153/87 128/85  Pulse: 68 60 65 61  Resp: 18 20 19 16   Temp: 98.6 F (37 C) 98 F (36.7 C) 97.7 F (36.5 C) 98 F (36.7 C)  TempSrc: Oral Oral Oral Oral  SpO2: 98%  99% 99%  Weight:   88 kg   Height:        Intake/Output Summary (Last 24 hours) at 07/01/2020 0935 Last data filed at 07/01/2020 0848 Gross per 24 hour  Intake 1237.47 ml  Output 1900 ml  Net -662.53 ml   Last 3 Weights 07/01/2020 06/30/2020 06/30/2020  Weight (lbs) 194 lb 1.6 oz 196 lb 13.9 oz 198 lb 3.2 oz  Weight (kg) 88.043 kg 89.3 kg 89.903 kg      Telemetry    NSR, HR 60, sPVCs - Personally Reviewed  ECG     NSR, PVCs, nonspecific ST changes- Personally Reviewed  Physical Exam   GEN: No acute distress.   Neck: No JVD Cardiac: RRR, no murmurs, rubs, or gallops.  Respiratory: Clear to auscultation bilaterally. GI: Soft, nontender, non-distended  MS: No edema; No deformity. Neuro:  Nonfocal  Psych: Normal affect   Labs    High Sensitivity Troponin:   Recent Labs  Lab 06/30/20 1752 06/30/20 1919  TROPONINIHS 463* 526*       Chemistry Recent Labs  Lab 06/30/20 1752 07/01/20 0301  NA 139 140  K 4.2 4.7  CL 106 109  CO2 25 22  GLUCOSE 95 104*  BUN 18 20  CREATININE 1.80* 1.71*  CALCIUM 9.5 9.1  PROT 6.4*  --   ALBUMIN 3.7  --   AST 27  --   ALT 19  --   ALKPHOS 58  --   BILITOT 0.6  --   GFRNONAA 36* 38*  GFRAA 41* 44*  ANIONGAP 8 9     Hematology Recent Labs  Lab 06/30/20 1752 07/01/20 0301  WBC 6.7 5.7  RBC 4.50 4.21*  HGB 14.0 13.0  HCT 41.6 38.5*  MCV 92.4 91.4  MCH 31.1 30.9  MCHC 33.7 33.8  RDW 13.2 13.4  PLT 175 154    BNPNo results for input(s): BNP, PROBNP in the last 168 hours.   DDimer No results for input(s): DDIMER in the last 168 hours.   Radiology    DG CHEST PORT 1 VIEW  Result Date: 06/30/2020 CLINICAL DATA:  Chest pain EXAM: PORTABLE CHEST 1 VIEW COMPARISON:  06/03/2020 FINDINGS: The heart size and mediastinal contours are within normal limits. Both lungs are clear. The visualized skeletal structures are unremarkable. IMPRESSION: No  active disease. Electronically Signed   By: Donavan Foil M.D.   On: 06/30/2020 19:29    Cardiac Studies   Cardiac cath 04/2016  Widely patent circumflex stents previously placed  No other angiographically significant coronary artery disease present.  Normal left ventriculogram, no mitral regurgitation, no aortic stenosis, EF 65%   Candee Furbish, MD Continue with medical therapy, reassurance   Patient Profile     79 y.o. male with pmh of CAD with overlapping DES Cfx EF 44% in 2013, repeat cath in 04/2016 patent Cx stent and other coronaries normal with normal EF, HLD intolerant to statins and declined PCSK91, history of OSA not on CPAP, CKD stage 3a s/p R nephrectomy who was admitted from the office for chest pain and sob.    Assessment & Plan    Unstable Angina/history of CAD s/p DES Cx in 2013 - admitted form the office withy 3 weeks of progressive UA relieved with rest. Given IVF with creatinine 1.8 and admitted for  cath - Aspirin 81 mg daily, Toprol-XL 25mg  daily - Hs troponin 463>526 - Last cath in 04/2016 showed patent stent in cx and otherwise normal coronaries with EF 65%  - Plan for cardiac cath today  HLD - statin intolerance, not on PCSK9i  - lipid panel 8/12: LDL 49, HDL 104, TG 49, total 158  CKD stage 3 s/p R nephrectomy 11/2019 - creatinine 1.8>1.71 - continue to monitor  For questions or updates, please contact [facility] Please consult [email or web address] for contact info under        Signed, Cadence Ninfa Meeker, PA-C  07/01/2020, 9:35 AM    History and all data above reviewed.  Patient examined.  I agree with the findings as above.  Very nice gentleman with chest pain and SOB consistent with [place] and now with elevated trop.  Suspect obstructive CAD.  Cath today.  No further chest pain.  The patient exam reveals COR:RRR  ,  Lungs: Clear  ,  Abd: Positive bowel sounds, no rebound no guarding, Ext No edema  .  All available labs, radiology testing, previous records reviewed. Agree with documented assessment and plan.   Chest pain:  Cath today.  The patient is aware of risk benefits.  No LV gram with CKD.  Minimize dye.  He has been hydrated.   Dyslipidemia:  He would consent to starting PCSK9 inhibitor as an outpatient.  CKD IIIIa.  Follow creat and minimize dye.      Jeneen Rinks Zykeria Laguardia  10:01 AM  07/01/2020

## 2020-07-01 NOTE — H&P (View-Only) (Signed)
Progress Note  Patient Name: Sean Hammond Date of Encounter: 07/01/2020  [facility] Cardiologist: Candee Furbish, MD   Subjective   Plan for cardiac cath today. Creatinine 1.71 after hydration. Had a twinge of chest pain this morning. Breathing stable.   Inpatient Medications    Scheduled Meds: . aspirin EC  81 mg Oral Daily  . metoprolol succinate  25 mg Oral Daily  . pantoprazole  40 mg Oral QPC breakfast  . sodium chloride flush  3 mL Intravenous Q12H  . tamsulosin  0.4 mg Oral Daily   Continuous Infusions: . sodium chloride    . sodium chloride 1 mL/kg/hr (07/01/20 0843)  . heparin 1,200 Units/hr (07/01/20 0845)   PRN Meds: sodium chloride, acetaminophen, diphenhydrAMINE, nitroGLYCERIN, ondansetron (ZOFRAN) IV, sodium chloride flush   Vital Signs    Vitals:   06/30/20 2005 07/01/20 0056 07/01/20 0421 07/01/20 0750  BP: 103/65 (!) 131/94 (!) 153/87 128/85  Pulse: 68 60 65 61  Resp: 18 20 19 16   Temp: 98.6 F (37 C) 98 F (36.7 C) 97.7 F (36.5 C) 98 F (36.7 C)  TempSrc: Oral Oral Oral Oral  SpO2: 98%  99% 99%  Weight:   88 kg   Height:        Intake/Output Summary (Last 24 hours) at 07/01/2020 0935 Last data filed at 07/01/2020 0848 Gross per 24 hour  Intake 1237.47 ml  Output 1900 ml  Net -662.53 ml   Last 3 Weights 07/01/2020 06/30/2020 06/30/2020  Weight (lbs) 194 lb 1.6 oz 196 lb 13.9 oz 198 lb 3.2 oz  Weight (kg) 88.043 kg 89.3 kg 89.903 kg      Telemetry    NSR, HR 60, sPVCs - Personally Reviewed  ECG     NSR, PVCs, nonspecific ST changes- Personally Reviewed  Physical Exam   GEN: No acute distress.   Neck: No JVD Cardiac: RRR, no murmurs, rubs, or gallops.  Respiratory: Clear to auscultation bilaterally. GI: Soft, nontender, non-distended  MS: No edema; No deformity. Neuro:  Nonfocal  Psych: Normal affect   Labs    High Sensitivity Troponin:   Recent Labs  Lab 06/30/20 1752 06/30/20 1919  TROPONINIHS 463* 526*       Chemistry Recent Labs  Lab 06/30/20 1752 07/01/20 0301  NA 139 140  K 4.2 4.7  CL 106 109  CO2 25 22  GLUCOSE 95 104*  BUN 18 20  CREATININE 1.80* 1.71*  CALCIUM 9.5 9.1  PROT 6.4*  --   ALBUMIN 3.7  --   AST 27  --   ALT 19  --   ALKPHOS 58  --   BILITOT 0.6  --   GFRNONAA 36* 38*  GFRAA 41* 44*  ANIONGAP 8 9     Hematology Recent Labs  Lab 06/30/20 1752 07/01/20 0301  WBC 6.7 5.7  RBC 4.50 4.21*  HGB 14.0 13.0  HCT 41.6 38.5*  MCV 92.4 91.4  MCH 31.1 30.9  MCHC 33.7 33.8  RDW 13.2 13.4  PLT 175 154    BNPNo results for input(s): BNP, PROBNP in the last 168 hours.   DDimer No results for input(s): DDIMER in the last 168 hours.   Radiology    DG CHEST PORT 1 VIEW  Result Date: 06/30/2020 CLINICAL DATA:  Chest pain EXAM: PORTABLE CHEST 1 VIEW COMPARISON:  06/03/2020 FINDINGS: The heart size and mediastinal contours are within normal limits. Both lungs are clear. The visualized skeletal structures are unremarkable. IMPRESSION: No  active disease. Electronically Signed   By: Donavan Foil M.D.   On: 06/30/2020 19:29    Cardiac Studies   Cardiac cath 04/2016  Widely patent circumflex stents previously placed  No other angiographically significant coronary artery disease present.  Normal left ventriculogram, no mitral regurgitation, no aortic stenosis, EF 65%   Candee Furbish, MD Continue with medical therapy, reassurance   Patient Profile     79 y.o. male with pmh of CAD with overlapping DES Cfx EF 44% in 2013, repeat cath in 04/2016 patent Cx stent and other coronaries normal with normal EF, HLD intolerant to statins and declined PCSK91, history of OSA not on CPAP, CKD stage 3a s/p R nephrectomy who was admitted from the office for chest pain and sob.    Assessment & Plan    Unstable Angina/history of CAD s/p DES Cx in 2013 - admitted form the office withy 3 weeks of progressive UA relieved with rest. Given IVF with creatinine 1.8 and admitted for  cath - Aspirin 81 mg daily, Toprol-XL 25mg  daily - Hs troponin 463>526 - Last cath in 04/2016 showed patent stent in cx and otherwise normal coronaries with EF 65%  - Plan for cardiac cath today  HLD - statin intolerance, not on PCSK9i  - lipid panel 8/12: LDL 49, HDL 104, TG 49, total 158  CKD stage 3 s/p R nephrectomy 11/2019 - creatinine 1.8>1.71 - continue to monitor  For questions or updates, please contact [facility] Please consult [email or web address] for contact info under        Signed, Cadence Ninfa Meeker, PA-C  07/01/2020, 9:35 AM    History and all data above reviewed.  Patient examined.  I agree with the findings as above.  Very nice gentleman with chest pain and SOB consistent with [place] and now with elevated trop.  Suspect obstructive CAD.  Cath today.  No further chest pain.  The patient exam reveals COR:RRR  ,  Lungs: Clear  ,  Abd: Positive bowel sounds, no rebound no guarding, Ext No edema  .  All available labs, radiology testing, previous records reviewed. Agree with documented assessment and plan.   Chest pain:  Cath today.  The patient is aware of risk benefits.  No LV gram with CKD.  Minimize dye.  He has been hydrated.   Dyslipidemia:  He would consent to starting PCSK9 inhibitor as an outpatient.  CKD IIIIa.  Follow creat and minimize dye.      Jeneen Rinks Chloeanne Poteet  10:01 AM  07/01/2020

## 2020-07-01 NOTE — Interval H&P Note (Signed)
Cath Lab Visit (complete for each Cath Lab visit)  Clinical Evaluation Leading to the Procedure:   ACS: Yes.    Non-ACS:    Anginal Classification: CCS III  Anti-ischemic medical therapy: Minimal Therapy (1 class of medications)  Non-Invasive Test Results: No non-invasive testing performed  Prior CABG: No previous CABG      History and Physical Interval Note:  07/01/2020 12:47 PM  Sean Hammond  has presented today for surgery, with the diagnosis of unstable angin.  The various methods of treatment have been discussed with the patient and family. After consideration of risks, benefits and other options for treatment, the patient has consented to  Procedure(s): LEFT HEART CATH AND CORONARY ANGIOGRAPHY (N/A) as a surgical intervention.  The patient's history has been reviewed, patient examined, no change in status, stable for surgery.  I have reviewed the patient's chart and labs.  Questions were answered to the patient's satisfaction.     Belva Crome III

## 2020-07-01 NOTE — CV Procedure (Signed)
·   Left heart cath, coronary angiography, left ventriculography, and PCI with stent of the proximal to mid LAD.  Real-time vascular ultrasound used for right radial access.  Subtotally occluded right coronary from proximal to mid vessel, greater than 95% with reduced flow.  Faint left-to-right collaterals noted in the left coronary visualization.  PCI and stent of the RCA from 99% to 0% with TIMI grade III flow using a 2.75 x 30 Onyx postdilated to 3.0 mm in diameter.  Left main is widely patent  Moderate diffuse disease proximal to mid LAD up to 60%.  Circumflex contains proximal to mid eccentric 75 to 80% stenosis above the previously placed stent and the first obtuse marginal which arises just beyond this lesion contains relatively focal 85% stenosis.  This marginal could be causing symptoms as well.  Interventional therapy was not applied today because of CKD and contrast limitations.  Left ventriculography demonstrates EF of 60% with suggestion of inferobasal hypokinesis.  LVEDP is normal.  Recommend aspirin and Plavix for 12 months (secondary to long stent).  Also recommend PCI on circumflex marginal will need to be done at a separate setting.  If the patient is able to ambulate tomorrow without recurrent angina and feels well it would be okay to discharge home and have him come back next week for PCI of the circumflex.  Will likely need to have stent placed in the first obtuse marginal and then circumflex proximal to the previous stent would need to be stented and it will detail the obtuse marginal.  Could be a complicated procedure and hence not undertaken as an add-on to today's procedure.

## 2020-07-02 ENCOUNTER — Encounter (HOSPITAL_COMMUNITY): Payer: Self-pay | Admitting: Interventional Cardiology

## 2020-07-02 DIAGNOSIS — N183 Chronic kidney disease, stage 3 unspecified: Secondary | ICD-10-CM

## 2020-07-02 DIAGNOSIS — I214 Non-ST elevation (NSTEMI) myocardial infarction: Principal | ICD-10-CM

## 2020-07-02 LAB — BASIC METABOLIC PANEL
Anion gap: 6 (ref 5–15)
BUN: 18 mg/dL (ref 8–23)
CO2: 25 mmol/L (ref 22–32)
Calcium: 8.9 mg/dL (ref 8.9–10.3)
Chloride: 108 mmol/L (ref 98–111)
Creatinine, Ser: 1.66 mg/dL — ABNORMAL HIGH (ref 0.61–1.24)
GFR calc Af Amer: 45 mL/min — ABNORMAL LOW (ref >60)
GFR calc non Af Amer: 39 mL/min — ABNORMAL LOW (ref >60)
Glucose, Bld: 95 mg/dL (ref 70–99)
Potassium: 4.5 mmol/L (ref 3.5–5.1)
Sodium: 139 mmol/L (ref 135–145)

## 2020-07-02 LAB — CBC
HCT: 38.1 % — ABNORMAL LOW (ref 39.0–52.0)
Hemoglobin: 12.9 g/dL — ABNORMAL LOW (ref 13.0–17.0)
MCH: 31.2 pg (ref 26.0–34.0)
MCHC: 33.9 g/dL (ref 30.0–36.0)
MCV: 92 fL (ref 80.0–100.0)
Platelets: 157 10*3/uL (ref 150–400)
RBC: 4.14 MIL/uL — ABNORMAL LOW (ref 4.22–5.81)
RDW: 13.5 % (ref 11.5–15.5)
WBC: 6.3 10*3/uL (ref 4.0–10.5)
nRBC: 0 % (ref 0.0–0.2)

## 2020-07-02 MED ORDER — SODIUM CHLORIDE 0.9% FLUSH
3.0000 mL | Freq: Two times a day (BID) | INTRAVENOUS | Status: DC
  Administered 2020-07-03 – 2020-07-05 (×4): 3 mL via INTRAVENOUS

## 2020-07-02 NOTE — Progress Notes (Addendum)
Progress Note  Patient Name: Sean Hammond Date of Encounter: 07/02/2020  Primary Cardiologist: Candee Furbish, MD  Subjective   Feeling well this morning. Just spoke to cardiac rehab team - they plan to walk him shortly to assess for recurrent angina. Patient states he was told by Dr. Tamala Julian he would plan to stay in the hospital regardless, so awaiting plan for +/- PCI on Monday.  Inpatient Medications    Scheduled Meds: . aspirin  81 mg Oral Daily  . clopidogrel  75 mg Oral Q breakfast  . heparin  5,000 Units Subcutaneous Q8H  . metoprolol succinate  25 mg Oral Daily  . pantoprazole  40 mg Oral QPC breakfast  . sodium chloride flush  3 mL Intravenous Q12H  . tamsulosin  0.4 mg Oral Daily   Continuous Infusions: . sodium chloride     PRN Meds: sodium chloride, acetaminophen, diphenhydrAMINE, nitroGLYCERIN, ondansetron (ZOFRAN) IV, sodium chloride flush   Vital Signs    Vitals:   07/01/20 2022 07/01/20 2105 07/02/20 0022 07/02/20 0630  BP: 130/72  (!) 126/56 125/86  Pulse: 65  62 75  Resp:   16 18  Temp:  97.9 F (36.6 C) 97.8 F (36.6 C) 98.1 F (36.7 C)  TempSrc:  Oral Oral Oral  SpO2: 99%  98% 98%  Weight:    87.8 kg  Height:        Intake/Output Summary (Last 24 hours) at 07/02/2020 0825 Last data filed at 07/02/2020 0600 Gross per 24 hour  Intake 997.56 ml  Output 1950 ml  Net -952.44 ml   Last 3 Weights 07/02/2020 07/01/2020 06/30/2020  Weight (lbs) 193 lb 9.6 oz 194 lb 1.6 oz 196 lb 13.9 oz  Weight (kg) 87.816 kg 88.043 kg 89.3 kg     Telemetry    NSR - Personally Reviewed  ECG    NSR 74bpm nonspecific ST changes V2-V3 - Personally Reviewed  Physical Exam   GEN: No acute distress.  HEENT: Normocephalic, atraumatic, sclera non-icteric. Neck: No JVD or bruits. Cardiac: RRR no murmurs, rubs, or gallops.  Radials/DP/PT 1+ and equal bilaterally.  Respiratory: Clear to auscultation bilaterally. Breathing is unlabored. GI: Soft, nontender,  non-distended, BS +x 4. MS: no deformity. Extremities: No clubbing or cyanosis. No edema. Distal pedal pulses are 2+ and equal bilaterally. Right radial cath site without hematoma or ecchymosis; good pulse. Neuro:  AAOx3. Follows commands. Psych:  Responds to questions appropriately with a normal affect.  Labs    High Sensitivity Troponin:   Recent Labs  Lab 06/30/20 1752 06/30/20 1919  TROPONINIHS 463* 526*      Cardiac EnzymesNo results for input(s): TROPONINI in the last 168 hours. No results for input(s): TROPIPOC in the last 168 hours.   Chemistry Recent Labs  Lab 06/30/20 1752 07/01/20 0301 07/02/20 0346  NA 139 140 139  K 4.2 4.7 4.5  CL 106 109 108  CO2 25 22 25   GLUCOSE 95 104* 95  BUN 18 20 18   CREATININE 1.80* 1.71* 1.66*  CALCIUM 9.5 9.1 8.9  PROT 6.4*  --   --   ALBUMIN 3.7  --   --   AST 27  --   --   ALT 19  --   --   ALKPHOS 58  --   --   BILITOT 0.6  --   --   GFRNONAA 36* 38* 39*  GFRAA 41* 44* 45*  ANIONGAP 8 9 6      Hematology Recent Labs  Lab 06/30/20 1752 07/01/20 0301 07/02/20 0346  WBC 6.7 5.7 6.3  RBC 4.50 4.21* 4.14*  HGB 14.0 13.0 12.9*  HCT 41.6 38.5* 38.1*  MCV 92.4 91.4 92.0  MCH 31.1 30.9 31.2  MCHC 33.7 33.8 33.9  RDW 13.2 13.4 13.5  PLT 175 154 157    BNPNo results for input(s): BNP, PROBNP in the last 168 hours.   DDimer No results for input(s): DDIMER in the last 168 hours.   Radiology    CARDIAC CATHETERIZATION  Result Date: 07/01/2020  Subtotal occlusion with greater than 95% stenosis from proximal to mid RCA.  Week left-to-right collaterals are noted.  Successful stenting of the right coronary with reduction in stenosis to 0% and TIMI grade III flow using a 30 x 2.75 mm Onyx postdilated to 3.0 mm in diameter.  Left main is widely patent  LAD contains moderate to moderately severe mid atherosclerosis.  No high-grade obstruction is noted.  Circumflex contains mid vessel stents.  The stents started just below  the first obtuse marginal.  The first marginal contains a proximal 85% stenosis.  The circumflex proximal to the origin of the first obtuse marginal contains eccentric 70 to 80% stenosis.  The obtuse marginal and circumflex will need percutaneous intervention.  Should be staged he electively performed after 72 hours post contrast exposure on this procedure.  Inferior wall hypokinesis.  EF is 50%. RECOMMENDATIONS:  Aspirin and Plavix for least 12 months and eventually consider Plavix monotherapy.  If no recurrent angina with ambulation tomorrow, will be okay to discharge the patient and have him return next week for PCI on circumflex and obtuse marginal.  If he has angina with physical activity prior to discharge, he should be kept in house over the weekend to undergo staged PCI on Monday.  If he has elective PCI next week it will be performed by me later in the week.  If he stays over the weekend, he will need to be put on the schedule of one of the interventional cardiology team on Monday.  Aggressive risk factor modification.  DG CHEST PORT 1 VIEW  Result Date: 06/30/2020 CLINICAL DATA:  Chest pain EXAM: PORTABLE CHEST 1 VIEW COMPARISON:  06/03/2020 FINDINGS: The heart size and mediastinal contours are within normal limits. Both lungs are clear. The visualized skeletal structures are unremarkable. IMPRESSION: No active disease. Electronically Signed   By: Donavan Foil M.D.   On: 06/30/2020 19:29    Cardiac Studies   LHC 07/01/20  Subtotal occlusion with greater than 95% stenosis from proximal to mid RCA.  Week left-to-right collaterals are noted.  Successful stenting of the right coronary with reduction in stenosis to 0% and TIMI grade III flow using a 30 x 2.75 mm Onyx postdilated to 3.0 mm in diameter.  Left main is widely patent  LAD contains moderate to moderately severe mid atherosclerosis.  No high-grade obstruction is noted.  Circumflex contains mid vessel stents.  The stents started  just below the first obtuse marginal.  The first marginal contains a proximal 85% stenosis.  The circumflex proximal to the origin of the first obtuse marginal contains eccentric 70 to 80% stenosis.  The obtuse marginal and circumflex will need percutaneous intervention.  Should be staged he electively performed after 72 hours post contrast exposure on this procedure.  Inferior wall hypokinesis.  EF is 50%.  RECOMMENDATIONS:  Aspirin and Plavix for least 12 months and eventually consider Plavix monotherapy.  If no recurrent angina with ambulation tomorrow, will  be okay to discharge the patient and have him return next week for PCI on circumflex and obtuse marginal.  If he has angina with physical activity prior to discharge, he should be kept in house over the weekend to undergo staged PCI on Monday.  If he has elective PCI next week it will be performed by me later in the week.  If he stays over the weekend, he will need to be put on the schedule of one of the interventional cardiology team on Monday.  Aggressive risk factor modification.   Patient Profile     79 y.o. male with CAD with overlapping DES to Cx in 2013, HLD intolerant to statins and declined PCSK9, OSA not on CPAP, CKD stage 3a s/p R nephrectomy who was admitted from the office for chest pain and SOB concerning for unstable angina -> troponins elevated c/w NSTEMI.  Assessment & Plan    1. NSTEMI/CAD (trop [phone]) - cath results as above with subtotal RCA s/p DES, moderate 60% mLAD, 75-80% prox-mid Cx and 85% OM1 (latter of which could be causing symptoms as well), LVEF 60% with inferobasal hypokinesis - Hgb 14 on admission and 13 pre-cath, stable at 12.9 today - cardiac rehab to see today to review for recurrent angina to decide cath this admission on Monday  - continue ASA, Plavix, BB  2. Hyperlipidemia - lipid profile 07/01/20 with LDL of 104 -> intolerant of statins, previously declined PCSK9i - recommend revisiting  PCSK9i as OP given progressive CAD  3. CKD stage III s/p R nephrectomy 11/2019 (baseline Cr ~1.8) - stable post-cath at 1.66  For questions or updates, please contact [place] Please consult [email or web address] for contact info under Cardiology/STEMI.  Signed, Charlie Pitter, PA-C 07/02/2020, 8:25 AM     History and all data above reviewed.  Patient examined.  I agree with the findings as above.   Patient with mutlivessel disease with plan for staged PCI with adequate interval for hydration between procedures.  The patient exam reveals COR:RRR,  Lungs:   Clear to auscultation  ,  Abd: Positive bowel sounds, no rebound no guarding, Ext No edema  .  All available labs, radiology testing, previous records reviewed. Agree with documented assessment and plan. CAD/Unstable angina:  PCI yesterday as above.  Plan is for staged PCI on Monday.    Needs to stay in hospital given high risk nature with his unstable symptoms and his single kidney.    Sean Hammond  10:13 AM  07/02/2020

## 2020-07-02 NOTE — Progress Notes (Signed)
CARDIAC REHAB PHASE I   PRE:  Rate/Rhythm: 79 SR  BP:  Supine:   Sitting: 134/96  Standing:    SaO2: 97%RA  MODE:  Ambulation: 800 ft   POST:  Rate/Rhythm: 96 SR  BP:  Supine:   Sitting: 156/90  Standing:    SaO2: 97%RA 0820-0915 Pt walked 800 ft on RA with steady gait and no CP. Tolerated well. Discussed importance of plavix with stent. Reviewed NTG use and heart healthy food choices. Pt has attended [place] [facility] before. Will refer back to program. Will continue to follow.   Graylon Good, RN BSN  07/02/2020 9:13 AM

## 2020-07-03 DIAGNOSIS — I214 Non-ST elevation (NSTEMI) myocardial infarction: Secondary | ICD-10-CM | POA: Diagnosis not present

## 2020-07-03 LAB — CBC
HCT: 40.4 % (ref 39.0–52.0)
Hemoglobin: 13.8 g/dL (ref 13.0–17.0)
MCH: 31.4 pg (ref 26.0–34.0)
MCHC: 34.2 g/dL (ref 30.0–36.0)
MCV: 91.8 fL (ref 80.0–100.0)
Platelets: 171 10*3/uL (ref 150–400)
RBC: 4.4 MIL/uL (ref 4.22–5.81)
RDW: 13.4 % (ref 11.5–15.5)
WBC: 5.2 10*3/uL (ref 4.0–10.5)
nRBC: 0 % (ref 0.0–0.2)

## 2020-07-03 LAB — BASIC METABOLIC PANEL
Anion gap: 9 (ref 5–15)
BUN: 22 mg/dL (ref 8–23)
CO2: 22 mmol/L (ref 22–32)
Calcium: 9.3 mg/dL (ref 8.9–10.3)
Chloride: 107 mmol/L (ref 98–111)
Creatinine, Ser: 1.87 mg/dL — ABNORMAL HIGH (ref 0.61–1.24)
GFR calc Af Amer: 39 mL/min — ABNORMAL LOW (ref >60)
GFR calc non Af Amer: 34 mL/min — ABNORMAL LOW (ref >60)
Glucose, Bld: 117 mg/dL — ABNORMAL HIGH (ref 70–99)
Potassium: 4.3 mmol/L (ref 3.5–5.1)
Sodium: 138 mmol/L (ref 135–145)

## 2020-07-03 NOTE — Progress Notes (Signed)
Progress Note  Patient Name: Sean Hammond Date of Encounter: 07/03/2020  Primary Cardiologist:   Candee Furbish, MD   Subjective   No chest pain.  No SOB.   Inpatient Medications    Scheduled Meds: . aspirin  81 mg Oral Daily  . clopidogrel  75 mg Oral Q breakfast  . heparin  5,000 Units Subcutaneous Q8H  . metoprolol succinate  25 mg Oral Daily  . pantoprazole  40 mg Oral QPC breakfast  . sodium chloride flush  3 mL Intravenous Q12H  . sodium chloride flush  3 mL Intravenous Q12H  . tamsulosin  0.4 mg Oral Daily   Continuous Infusions: . sodium chloride     PRN Meds: sodium chloride, acetaminophen, diphenhydrAMINE, nitroGLYCERIN, ondansetron (ZOFRAN) IV, sodium chloride flush   Vital Signs    Vitals:   07/02/20 1209 07/02/20 2046 07/03/20 0656 07/03/20 0823  BP: (!) 157/98 124/73 128/89 (!) 149/94  Pulse: 83 70 71 82  Resp: 20 18 18 18   Temp: 98 F (36.7 C) 97.7 F (36.5 C) 98.1 F (36.7 C) 98 F (36.7 C)  TempSrc: Oral Oral Oral Oral  SpO2: 96% 99% 95% 95%  Weight:   86.8 kg   Height:        Intake/Output Summary (Last 24 hours) at 07/03/2020 1118 Last data filed at 07/02/2020 1400 Gross per 24 hour  Intake 240 ml  Output 600 ml  Net -360 ml   Filed Weights   07/01/20 0421 07/02/20 0630 07/03/20 0656  Weight: 88 kg 87.8 kg 86.8 kg    Telemetry    NSR - Personally Reviewed  ECG    NA - Personally Reviewed  Physical Exam   GEN: No acute distress.   Neck: No  JVD Cardiac: RRR, no murmurs, rubs, or gallops.  Respiratory: Clear  to auscultation bilaterally. GI: Soft, nontender, non-distended  MS: No  edema; No deformity. Neuro:  Nonfocal  Psych: Normal affect   Labs    Chemistry Recent Labs  Lab 06/30/20 1752 06/30/20 1752 07/01/20 0301 07/02/20 0346 07/03/20 0352  NA 139   < > 140 139 138  K 4.2   < > 4.7 4.5 4.3  CL 106   < > 109 108 107  CO2 25   < > 22 25 22   GLUCOSE 95   < > 104* 95 117*  BUN 18   < > 20 18 22   CREATININE  1.80*   < > 1.71* 1.66* 1.87*  CALCIUM 9.5   < > 9.1 8.9 9.3  PROT 6.4*  --   --   --   --   ALBUMIN 3.7  --   --   --   --   AST 27  --   --   --   --   ALT 19  --   --   --   --   ALKPHOS 58  --   --   --   --   BILITOT 0.6  --   --   --   --   GFRNONAA 36*   < > 38* 39* 34*  GFRAA 41*   < > 44* 45* 39*  ANIONGAP 8   < > 9 6 9    < > = values in this interval not displayed.     Hematology Recent Labs  Lab 07/01/20 0301 07/02/20 0346 07/03/20 0352  WBC 5.7 6.3 5.2  RBC 4.21* 4.14* 4.40  HGB 13.0 12.9* 13.8  HCT 38.5* 38.1* 40.4  MCV 91.4 92.0 91.8  MCH 30.9 31.2 31.4  MCHC 33.8 33.9 34.2  RDW 13.4 13.5 13.4  PLT 154 157 171    Cardiac EnzymesNo results for input(s): TROPONINI in the last 168 hours. No results for input(s): TROPIPOC in the last 168 hours.   BNPNo results for input(s): BNP, PROBNP in the last 168 hours.   DDimer No results for input(s): DDIMER in the last 168 hours.   Radiology    CARDIAC CATHETERIZATION  Result Date: 07/01/2020  Subtotal occlusion with greater than 95% stenosis from proximal to mid RCA.  Week left-to-right collaterals are noted.  Successful stenting of the right coronary with reduction in stenosis to 0% and TIMI grade III flow using a 30 x 2.75 mm Onyx postdilated to 3.0 mm in diameter.  Left main is widely patent  LAD contains moderate to moderately severe mid atherosclerosis.  No high-grade obstruction is noted.  Circumflex contains mid vessel stents.  The stents started just below the first obtuse marginal.  The first marginal contains a proximal 85% stenosis.  The circumflex proximal to the origin of the first obtuse marginal contains eccentric 70 to 80% stenosis.  The obtuse marginal and circumflex will need percutaneous intervention.  Should be staged he electively performed after 72 hours post contrast exposure on this procedure.  Inferior wall hypokinesis.  EF is 50%. RECOMMENDATIONS:  Aspirin and Plavix for least 12 months and  eventually consider Plavix monotherapy.  If no recurrent angina with ambulation tomorrow, will be okay to discharge the patient and have him return next week for PCI on circumflex and obtuse marginal.  If he has angina with physical activity prior to discharge, he should be kept in house over the weekend to undergo staged PCI on Monday.  If he has elective PCI next week it will be performed by me later in the week.  If he stays over the weekend, he will need to be put on the schedule of one of the interventional cardiology team on Monday.  Aggressive risk factor modification.   Cardiac Studies   LHC 07/01/20  Subtotal occlusion with greater than 95% stenosis from proximal to mid RCA. Week left-to-right collaterals are noted.  Successful stenting of the right coronary with reduction in stenosis to 0% and TIMI grade III flow using a 30 x 2.75 mm Onyx postdilated to 3.0 mm in diameter.  Left main is widely patent  LAD contains moderate to moderately severe mid atherosclerosis. No high-grade obstruction is noted.  Circumflex contains mid vessel stents. The stents started just below the first obtuse marginal. The first marginal contains a proximal 85% stenosis. The circumflex proximal to the origin of the first obtuse marginal contains eccentric 70 to 80% stenosis. The obtuse marginal and circumflex will need percutaneous intervention. Should be staged he electively performed after 72 hours post contrast exposure on this procedure.  Inferior wall hypokinesis. EF is 50%.  RECOMMENDATIONS:  Aspirin and Plavix for least 12 months and eventually consider Plavix monotherapy.  If no recurrent angina with ambulation tomorrow, will be okay to discharge the patient and have him return next week for PCI on circumflex and obtuse marginal. If he has angina with physical activity prior to discharge, he should be kept in house over the weekend to undergo staged PCI on Monday. If he has elective PCI  next week it will be performed by me later in the week. If he stays over the weekend, he will need to  be put on the schedule of one of the interventional cardiology team on Monday.  Aggressive risk factor modification.   Patient Profile     79 y.o. male with overlapping DES to Cx in 2013, HLD intolerant to statins and declined PCSK9, OSA not on CPAP, CKD stage 3a s/p R nephrectomy who was admitted from the office for chest pain and SOB concerning for unstable angina -> troponins elevated c/w NSTEMI.  Assessment & Plan    NSTEMI:  On for staged PCI on Monday.  DYSLIPIDEMIA:  Will start PCSK9 as an out patient.   CKD IIIB:  Creat creeping up.  He will get hydration for his next procedure.   For questions or updates, please contact [place] Please consult [email or web address] for contact info under Cardiology/STEMI.   Signed, Minus Breeding, MD  07/03/2020, 11:18 AM

## 2020-07-03 NOTE — Progress Notes (Signed)
CARDIAC REHAB PHASE I   PRE:  Rate/Rhythm: NSR/83  BP:  Sitting: 122/89      SaO2: 97%  MODE:  Ambulation: 400 ft   POST:  Rate/Rhythm: NSR/94  BP:  Sitting: 125/84      SaO2: 96%  0930-0958 Pt walked 400 ft on RA with strong, steady gait and no complaints. Pt voices that he has been up and walking around with no complaints. Pt back to chair, call bell within reach. Will continue to follow. Staged PCI for 07/05/2020.  Lesly Rubenstein, MS, ACSM EP-C, CCRP 07/03/2020  09:59 am

## 2020-07-04 DIAGNOSIS — N1831 Chronic kidney disease, stage 3a: Secondary | ICD-10-CM | POA: Diagnosis not present

## 2020-07-04 LAB — CBC
HCT: 39.4 % (ref 39.0–52.0)
Hemoglobin: 12.9 g/dL — ABNORMAL LOW (ref 13.0–17.0)
MCH: 30 pg (ref 26.0–34.0)
MCHC: 32.7 g/dL (ref 30.0–36.0)
MCV: 91.6 fL (ref 80.0–100.0)
Platelets: 174 10*3/uL (ref 150–400)
RBC: 4.3 MIL/uL (ref 4.22–5.81)
RDW: 13.5 % (ref 11.5–15.5)
WBC: 6 10*3/uL (ref 4.0–10.5)
nRBC: 0 % (ref 0.0–0.2)

## 2020-07-04 LAB — BASIC METABOLIC PANEL
Anion gap: 7 (ref 5–15)
BUN: 31 mg/dL — ABNORMAL HIGH (ref 8–23)
CO2: 23 mmol/L (ref 22–32)
Calcium: 9.2 mg/dL (ref 8.9–10.3)
Chloride: 106 mmol/L (ref 98–111)
Creatinine, Ser: 1.92 mg/dL — ABNORMAL HIGH (ref 0.61–1.24)
GFR calc Af Amer: 38 mL/min — ABNORMAL LOW (ref >60)
GFR calc non Af Amer: 33 mL/min — ABNORMAL LOW (ref >60)
Glucose, Bld: 100 mg/dL — ABNORMAL HIGH (ref 70–99)
Potassium: 4.4 mmol/L (ref 3.5–5.1)
Sodium: 136 mmol/L (ref 135–145)

## 2020-07-04 MED ORDER — SODIUM CHLORIDE 0.9% FLUSH: 3.0000 mL | INTRAVENOUS | Status: DC | PRN

## 2020-07-04 MED ORDER — ASPIRIN 81 MG PO CHEW
81.0000 mg | CHEWABLE_TABLET | ORAL | Status: AC
  Administered 2020-07-05: 81 mg via ORAL
  Filled 2020-07-04: qty 1

## 2020-07-04 MED ORDER — ASPIRIN 81 MG PO CHEW: 81.0000 mg | CHEWABLE_TABLET | Freq: Every day | ORAL | Status: DC

## 2020-07-04 MED ORDER — SODIUM CHLORIDE 0.9 % WEIGHT BASED INFUSION: 1.0000 mL/kg/h | INTRAVENOUS | Status: DC

## 2020-07-04 MED ORDER — SODIUM CHLORIDE 0.9 % WEIGHT BASED INFUSION
3.0000 mL/kg/h | INTRAVENOUS | Status: AC
  Administered 2020-07-04: 3 mL/kg/h via INTRAVENOUS

## 2020-07-04 MED ORDER — SODIUM CHLORIDE 0.9 % IV SOLN: 250.0000 mL | INTRAVENOUS | Status: DC | PRN

## 2020-07-04 MED ORDER — SODIUM CHLORIDE 0.9 % WEIGHT BASED INFUSION
1.0000 mL/kg/h | INTRAVENOUS | Status: DC
  Administered 2020-07-05: 1 mL/kg/h via INTRAVENOUS

## 2020-07-04 MED ORDER — SODIUM CHLORIDE 0.9 % WEIGHT BASED INFUSION: 3.0000 mL/kg/h | INTRAVENOUS | Status: DC

## 2020-07-04 NOTE — Progress Notes (Signed)
Patient refused cath video  

## 2020-07-04 NOTE — Progress Notes (Signed)
Progress Note  Patient Name: TYRON MANETTA Date of Encounter: 07/04/2020  Primary Cardiologist:   Candee Furbish, MD   Subjective   No complaints overnight.   Inpatient Medications    Scheduled Meds: . aspirin  81 mg Oral Daily  . clopidogrel  75 mg Oral Q breakfast  . heparin  5,000 Units Subcutaneous Q8H  . metoprolol succinate  25 mg Oral Daily  . pantoprazole  40 mg Oral QPC breakfast  . sodium chloride flush  3 mL Intravenous Q12H  . sodium chloride flush  3 mL Intravenous Q12H  . tamsulosin  0.4 mg Oral Daily   Continuous Infusions: . sodium chloride     PRN Meds: sodium chloride, acetaminophen, diphenhydrAMINE, nitroGLYCERIN, ondansetron (ZOFRAN) IV, sodium chloride flush   Vital Signs    Vitals:   07/03/20 1329 07/03/20 1653 07/04/20 0500 07/04/20 0916  BP:  105/84 (!) 145/92 (!) 144/84  Pulse: 82 73 88   Resp:  18 20   Temp: 98.2 F (36.8 C) (!) 97.5 F (36.4 C) 98 F (36.7 C)   TempSrc: Skin Oral Oral   SpO2:  96% 98%   Weight:   85.7 kg   Height:        Intake/Output Summary (Last 24 hours) at 07/04/2020 1131 Last data filed at 07/04/2020 1110 Gross per 24 hour  Intake 720 ml  Output 1400 ml  Net -680 ml   Filed Weights   07/02/20 0630 07/03/20 0656 07/04/20 0500  Weight: 87.8 kg 86.8 kg 85.7 kg    Telemetry    NSR- Personally Reviewed  ECG    NA - Personally Reviewed  Physical Exam   GEN: No acute distress.   Cardiac: RRR, no murmurs, rubs, or gallops.  Respiratory: Clear  to auscultation bilaterally. GI: Soft, nontender, non-distended  MS: No  edema; No deformity. Right radial bruising.  No bleeding  Labs    Chemistry Recent Labs  Lab 06/30/20 1752 07/01/20 0301 07/02/20 0346 07/03/20 0352 07/04/20 0855  NA 139   < > 139 138 136  K 4.2   < > 4.5 4.3 4.4  CL 106   < > 108 107 106  CO2 25   < > 25 22 23   GLUCOSE 95   < > 95 117* 100*  BUN 18   < > 18 22 31*  CREATININE 1.80*   < > 1.66* 1.87* 1.92*  CALCIUM 9.5   < >  8.9 9.3 9.2  PROT 6.4*  --   --   --   --   ALBUMIN 3.7  --   --   --   --   AST 27  --   --   --   --   ALT 19  --   --   --   --   ALKPHOS 58  --   --   --   --   BILITOT 0.6  --   --   --   --   GFRNONAA 36*   < > 39* 34* 33*  GFRAA 41*   < > 45* 39* 38*  ANIONGAP 8   < > 6 9 7    < > = values in this interval not displayed.     Hematology Recent Labs  Lab 07/02/20 0346 07/03/20 0352 07/04/20 0855  WBC 6.3 5.2 6.0  RBC 4.14* 4.40 4.30  HGB 12.9* 13.8 12.9*  HCT 38.1* 40.4 39.4  MCV 92.0 91.8 91.6  MCH 31.2  31.4 30.0  MCHC 33.9 34.2 32.7  RDW 13.5 13.4 13.5  PLT 157 171 174    Cardiac EnzymesNo results for input(s): TROPONINI in the last 168 hours. No results for input(s): TROPIPOC in the last 168 hours.   BNPNo results for input(s): BNP, PROBNP in the last 168 hours.   DDimer No results for input(s): DDIMER in the last 168 hours.   Radiology    No results found.  Cardiac Studies   LHC 07/01/20  Subtotal occlusion with greater than 95% stenosis from proximal to mid RCA. Week left-to-right collaterals are noted.  Successful stenting of the right coronary with reduction in stenosis to 0% and TIMI grade III flow using a 30 x 2.75 mm Onyx postdilated to 3.0 mm in diameter.  Left main is widely patent  LAD contains moderate to moderately severe mid atherosclerosis. No high-grade obstruction is noted.  Circumflex contains mid vessel stents. The stents started just below the first obtuse marginal. The first marginal contains a proximal 85% stenosis. The circumflex proximal to the origin of the first obtuse marginal contains eccentric 70 to 80% stenosis. The obtuse marginal and circumflex will need percutaneous intervention. Should be staged he electively performed after 72 hours post contrast exposure on this procedure.  Inferior wall hypokinesis. EF is 50%.  RECOMMENDATIONS:  Aspirin and Plavix for least 12 months and eventually consider Plavix  monotherapy.  If no recurrent angina with ambulation tomorrow, will be okay to discharge the patient and have him return next week for PCI on circumflex and obtuse marginal. If he has angina with physical activity prior to discharge, he should be kept in house over the weekend to undergo staged PCI on Monday. If he has elective PCI next week it will be performed by me later in the week. If he stays over the weekend, he will need to be put on the schedule of one of the interventional cardiology team on Monday.  Aggressive risk factor modification.   Patient Profile     79 y.o. male with overlapping DES to Cx in 2013, HLD intolerant to statins and declined PCSK9, OSA not on CPAP, CKD stage 3a s/p R nephrectomy who was admitted from the office for chest pain and SOB concerning for unstable angina -> troponins elevated c/w NSTEMI.  Assessment & Plan    NSTEMI:  On for staged PCI on Monday.  DYSLIPIDEMIA:  Will start PCSK9 as an out patient.   CKD IIIB:  Creat creeping up.  We will need to check the creat tomorrow before cath.  Hydrate starting tonight.    For questions or updates, please contact [place] Please consult [email or web address] for contact info under Cardiology/STEMI.   Signed, Minus Breeding, MD  07/04/2020, 11:31 AM

## 2020-07-05 ENCOUNTER — Encounter (HOSPITAL_COMMUNITY)
Admission: AD | Disposition: A | Discharge status: Home / Self Care | Payer: Self-pay | Source: Home / Self Care | Attending: Cardiology

## 2020-07-05 ENCOUNTER — Telehealth: Payer: Self-pay | Admitting: Physician Assistant

## 2020-07-05 ENCOUNTER — Other Ambulatory Visit: Payer: Self-pay | Admitting: Physician Assistant

## 2020-07-05 ENCOUNTER — Encounter (HOSPITAL_COMMUNITY): Payer: Self-pay | Admitting: Cardiology

## 2020-07-05 DIAGNOSIS — I2510 Atherosclerotic heart disease of native coronary artery without angina pectoris: Secondary | ICD-10-CM | POA: Diagnosis not present

## 2020-07-05 DIAGNOSIS — N1832 Chronic kidney disease, stage 3b: Secondary | ICD-10-CM

## 2020-07-05 DIAGNOSIS — Z955 Presence of coronary angioplasty implant and graft: Secondary | ICD-10-CM

## 2020-07-05 DIAGNOSIS — R079 Chest pain, unspecified: Secondary | ICD-10-CM

## 2020-07-05 DIAGNOSIS — I25110 Atherosclerotic heart disease of native coronary artery with unstable angina pectoris: Secondary | ICD-10-CM | POA: Diagnosis not present

## 2020-07-05 DIAGNOSIS — I214 Non-ST elevation (NSTEMI) myocardial infarction: Secondary | ICD-10-CM | POA: Diagnosis not present

## 2020-07-05 HISTORY — PX: CORONARY STENT INTERVENTION: CATH118234

## 2020-07-05 LAB — BASIC METABOLIC PANEL
Anion gap: 9 (ref 5–15)
BUN: 26 mg/dL — ABNORMAL HIGH (ref 8–23)
CO2: 22 mmol/L (ref 22–32)
Calcium: 9 mg/dL (ref 8.9–10.3)
Chloride: 108 mmol/L (ref 98–111)
Creatinine, Ser: 1.77 mg/dL — ABNORMAL HIGH (ref 0.61–1.24)
GFR calc Af Amer: 42 mL/min — ABNORMAL LOW (ref >60)
GFR calc non Af Amer: 36 mL/min — ABNORMAL LOW (ref >60)
Glucose, Bld: 114 mg/dL — ABNORMAL HIGH (ref 70–99)
Potassium: 4.2 mmol/L (ref 3.5–5.1)
Sodium: 139 mmol/L (ref 135–145)

## 2020-07-05 LAB — CBC
HCT: 37.2 % — ABNORMAL LOW (ref 39.0–52.0)
Hemoglobin: 12.9 g/dL — ABNORMAL LOW (ref 13.0–17.0)
MCH: 31.4 pg (ref 26.0–34.0)
MCHC: 34.7 g/dL (ref 30.0–36.0)
MCV: 90.5 fL (ref 80.0–100.0)
Platelets: 173 10*3/uL (ref 150–400)
RBC: 4.11 MIL/uL — ABNORMAL LOW (ref 4.22–5.81)
RDW: 13.4 % (ref 11.5–15.5)
WBC: 4.9 10*3/uL (ref 4.0–10.5)
nRBC: 0 % (ref 0.0–0.2)

## 2020-07-05 LAB — POCT ACTIVATED CLOTTING TIME
Activated Clotting Time: 235 seconds
Activated Clotting Time: 329 seconds

## 2020-07-05 SURGERY — CORONARY STENT INTERVENTION
Anesthesia: LOCAL

## 2020-07-05 MED ORDER — LIDOCAINE HCL (PF) 1 % IJ SOLN
INTRAMUSCULAR | Status: AC
  Filled 2020-07-05: qty 30

## 2020-07-05 MED ORDER — FENTANYL CITRATE (PF) 100 MCG/2ML IJ SOLN
INTRAMUSCULAR | Status: AC
  Filled 2020-07-05: qty 2

## 2020-07-05 MED ORDER — VERAPAMIL HCL 2.5 MG/ML IV SOLN
INTRAVENOUS | Status: AC
  Filled 2020-07-05: qty 2

## 2020-07-05 MED ORDER — CLOPIDOGREL BISULFATE 75 MG PO TABS: 75.0000 mg | ORAL_TABLET | Freq: Every day | ORAL | 3 refills | Status: DC

## 2020-07-05 MED ORDER — HYDRALAZINE HCL 20 MG/ML IJ SOLN: 10.0000 mg | INTRAMUSCULAR | Status: AC | PRN

## 2020-07-05 MED ORDER — FENTANYL CITRATE (PF) 100 MCG/2ML IJ SOLN
INTRAMUSCULAR | Status: DC | PRN
  Administered 2020-07-05: 25 ug via INTRAVENOUS

## 2020-07-05 MED ORDER — SODIUM CHLORIDE 0.9% FLUSH: 3.0000 mL | Freq: Two times a day (BID) | INTRAVENOUS | Status: DC

## 2020-07-05 MED ORDER — NITROGLYCERIN 1 MG/10 ML FOR IR/CATH LAB
INTRA_ARTERIAL | Status: AC
  Filled 2020-07-05: qty 10

## 2020-07-05 MED ORDER — NITROGLYCERIN 1 MG/10 ML FOR IR/CATH LAB
INTRA_ARTERIAL | Status: DC | PRN
  Administered 2020-07-05 (×2): 200 ug via INTRACORONARY

## 2020-07-05 MED ORDER — HEPARIN SODIUM (PORCINE) 1000 UNIT/ML IJ SOLN
INTRAMUSCULAR | Status: DC | PRN
  Administered 2020-07-05: 10000 [IU] via INTRAVENOUS

## 2020-07-05 MED ORDER — HEPARIN SODIUM (PORCINE) 1000 UNIT/ML IJ SOLN
INTRAMUSCULAR | Status: AC
  Filled 2020-07-05: qty 1

## 2020-07-05 MED ORDER — VERAPAMIL HCL 2.5 MG/ML IV SOLN
INTRAVENOUS | Status: DC | PRN
  Administered 2020-07-05: 10 mL via INTRA_ARTERIAL

## 2020-07-05 MED ORDER — HEPARIN (PORCINE) IN NACL 1000-0.9 UT/500ML-% IV SOLN
INTRAVENOUS | Status: DC | PRN
  Administered 2020-07-05 (×2): 500 mL

## 2020-07-05 MED ORDER — SODIUM CHLORIDE 0.9% FLUSH: 3.0000 mL | INTRAVENOUS | Status: DC | PRN

## 2020-07-05 MED ORDER — LIDOCAINE HCL (PF) 1 % IJ SOLN
INTRAMUSCULAR | Status: DC | PRN
  Administered 2020-07-05: 2 mL

## 2020-07-05 MED ORDER — ONDANSETRON HCL 4 MG/2ML IJ SOLN: 4.0000 mg | Freq: Four times a day (QID) | INTRAMUSCULAR | Status: DC | PRN

## 2020-07-05 MED ORDER — HEPARIN (PORCINE) IN NACL 1000-0.9 UT/500ML-% IV SOLN
INTRAVENOUS | Status: AC
  Filled 2020-07-05: qty 1000

## 2020-07-05 MED ORDER — MIDAZOLAM HCL 2 MG/2ML IJ SOLN
INTRAMUSCULAR | Status: DC | PRN
  Administered 2020-07-05: 1 mg via INTRAVENOUS

## 2020-07-05 MED ORDER — ASPIRIN 81 MG PO CHEW: 81.0000 mg | CHEWABLE_TABLET | Freq: Every day | ORAL | Status: DC

## 2020-07-05 MED ORDER — SODIUM CHLORIDE 0.9 % IV SOLN: 250.0000 mL | INTRAVENOUS | Status: DC | PRN

## 2020-07-05 MED ORDER — MIDAZOLAM HCL 2 MG/2ML IJ SOLN
INTRAMUSCULAR | Status: AC
  Filled 2020-07-05: qty 2

## 2020-07-05 MED ORDER — CLOPIDOGREL BISULFATE 75 MG PO TABS: 75.0000 mg | ORAL_TABLET | Freq: Every day | ORAL | Status: DC

## 2020-07-05 MED ORDER — SODIUM CHLORIDE 0.9 % IV SOLN: INTRAVENOUS | Status: AC

## 2020-07-05 MED ORDER — IOHEXOL 350 MG/ML SOLN
INTRAVENOUS | Status: DC | PRN
  Administered 2020-07-05: 65 mL

## 2020-07-05 MED ORDER — LABETALOL HCL 5 MG/ML IV SOLN: 10.0000 mg | INTRAVENOUS | Status: AC | PRN

## 2020-07-05 MED ORDER — ACETAMINOPHEN 325 MG PO TABS: 650.0000 mg | ORAL_TABLET | ORAL | Status: DC | PRN

## 2020-07-05 SURGICAL SUPPLY — 15 items
BALLN EMERGE MR 2.0X8 (BALLOONS) ×2
BALLOON EMERGE MR 2.0X8 (BALLOONS) IMPLANT
CATH VISTA GUIDE 6FR XB3.5 (CATHETERS) ×1 IMPLANT
DEVICE RAD COMP TR BAND LRG (VASCULAR PRODUCTS) ×1 IMPLANT
GLIDESHEATH SLEND A-KIT 6F 22G (SHEATH) ×1 IMPLANT
GUIDEWIRE INQWIRE 1.5J.035X260 (WIRE) IMPLANT
INQWIRE 1.5J .035X260CM (WIRE) ×2
KIT ENCORE 26 ADVANTAGE (KITS) ×1 IMPLANT
KIT HEART LEFT (KITS) ×2 IMPLANT
PACK CARDIAC CATHETERIZATION (CUSTOM PROCEDURE TRAY) ×2 IMPLANT
SHEATH PROBE COVER 6X72 (BAG) ×1 IMPLANT
STENT RESOLUTE ONYX 2.25X12 (Permanent Stent) ×1 IMPLANT
TRANSDUCER W/STOPCOCK (MISCELLANEOUS) ×2 IMPLANT
TUBING CIL FLEX 10 FLL-RA (TUBING) ×2 IMPLANT
WIRE ASAHI PROWATER 180CM (WIRE) ×1 IMPLANT

## 2020-07-05 NOTE — Progress Notes (Signed)
   Went to bedside around 6:50pm to examine right radial cath site following removal of TR band. Right radial cath site soft with no signs of new hematoma (bruising/resolving hematoma present from prior cath on 07/01/2020). OK for discharge. Reviewed post cath instructions one more time.  Darreld Mclean, PA-C 07/05/2020 9:56 PM

## 2020-07-05 NOTE — Telephone Encounter (Signed)
    Attention TOC pool,  This patient will need a TOC phone call after discharge. They are being discharged today. Follow-up appointment has already been arranged with: Truitt Merle on 8/25 They are a patient of Candee Furbish, MD.  Thank you! Charlie Pitter, PA-C

## 2020-07-05 NOTE — Progress Notes (Signed)
Progress Note  Patient Name: Sean Hammond Date of Encounter: 07/05/2020  [facility] Cardiologist: Candee Furbish, MD   Subjective   No chest pain in chair, talkative this morning.  Cardiac rehab in room.  Ambulating well.  Inpatient Medications    Scheduled Meds: . [START ON 07/06/2020] aspirin  81 mg Oral Daily  . clopidogrel  75 mg Oral Q breakfast  . heparin  5,000 Units Subcutaneous Q8H  . metoprolol succinate  25 mg Oral Daily  . pantoprazole  40 mg Oral QPC breakfast  . sodium chloride flush  3 mL Intravenous Q12H  . sodium chloride flush  3 mL Intravenous Q12H  . tamsulosin  0.4 mg Oral Daily   Continuous Infusions: . sodium chloride    . sodium chloride    . sodium chloride 1 mL/kg/hr (07/05/20 0819)   PRN Meds: sodium chloride, sodium chloride, acetaminophen, diphenhydrAMINE, nitroGLYCERIN, ondansetron (ZOFRAN) IV, sodium chloride flush, sodium chloride flush   Vital Signs    Vitals:   07/04/20 1305 07/04/20 1948 07/05/20 0501 07/05/20 0751  BP: 138/75 (!) 142/80 138/77 (!) 143/85  Pulse: 75 76 92 64  Resp: 15 16 18 20   Temp: 98.2 F (36.8 C) 98 F (36.7 C) 98.5 F (36.9 C) 98 F (36.7 C)  TempSrc: Oral Oral Oral Oral  SpO2: 97% 99% 99% 100%  Weight:   87.5 kg   Height:        Intake/Output Summary (Last 24 hours) at 07/05/2020 0852 Last data filed at 07/05/2020 0502 Gross per 24 hour  Intake 1018.51 ml  Output 3100 ml  Net -2081.49 ml   Last 3 Weights 07/05/2020 07/04/2020 07/03/2020  Weight (lbs) 193 lb 188 lb 15 oz 191 lb 4.8 oz  Weight (kg) 87.544 kg 85.7 kg 86.773 kg      Telemetry    Sinus rhythm- Personally Reviewed  ECG    No new- Personally Reviewed  Physical Exam   GEN: No acute distress.   Neck: No JVD Cardiac: RRR, no murmurs, rubs, or gallops.  Respiratory: Clear to auscultation bilaterally. GI: Soft, nontender, non-distended  MS: No edema; No deformity. Neuro:  Nonfocal  Psych: Normal affect   Labs    High  Sensitivity Troponin:   Recent Labs  Lab 06/30/20 1752 06/30/20 1919  TROPONINIHS 463* 526*      Chemistry Recent Labs  Lab 06/30/20 1752 07/01/20 0301 07/03/20 0352 07/04/20 0855 07/05/20 0339  NA 139   < > 138 136 139  K 4.2   < > 4.3 4.4 4.2  CL 106   < > 107 106 108  CO2 25   < > 22 23 22   GLUCOSE 95   < > 117* 100* 114*  BUN 18   < > 22 31* 26*  CREATININE 1.80*   < > 1.87* 1.92* 1.77*  CALCIUM 9.5   < > 9.3 9.2 9.0  PROT 6.4*  --   --   --   --   ALBUMIN 3.7  --   --   --   --   AST 27  --   --   --   --   ALT 19  --   --   --   --   ALKPHOS 58  --   --   --   --   BILITOT 0.6  --   --   --   --   GFRNONAA 36*   < > 34* 33* 36*  GFRAA 41*   < > 39* 38* 42*  ANIONGAP 8   < > 9 7 9    < > = values in this interval not displayed.     Hematology Recent Labs  Lab 07/03/20 0352 07/04/20 0855 07/05/20 0339  WBC 5.2 6.0 4.9  RBC 4.40 4.30 4.11*  HGB 13.8 12.9* 12.9*  HCT 40.4 39.4 37.2*  MCV 91.8 91.6 90.5  MCH 31.4 30.0 31.4  MCHC 34.2 32.7 34.7  RDW 13.4 13.5 13.4  PLT 171 174 173    BNPNo results for input(s): BNP, PROBNP in the last 168 hours.   DDimer No results for input(s): DDIMER in the last 168 hours.   Radiology    No results found.  Cardiac Studies   Cardiac catheterization reviewed  Patient Profile     79 y.o. male with progressive coronary artery disease and/catheterization 2017 here for staged PCI, chronic kidney disease stage IIIb, hyperlipidemia, statin intolerance  Assessment & Plan    Progressive coronary artery disease/non-ST elevation myocardial infarction -Second part of staged PCI today.  NPO.  Hydration.  Creatinine has come down this morning.  Compatible with previous creatinine levels.  Discussed with him disease process and how it is very important for him to lower his lipids, PCSK9. -Prior DES to circumflex 2013 -RCA intervention last week. -Personally reviewed prior cardiac catheterization from 2017, impressive  increase in CAD over that time.  Risk factor modification.  Dyslipidemia -PCSK9 as outpatient.  We have discussed previously several times in clinic.  He will now visit lipid clinic.  Statin intolerance -Has tried several different statins, unable to tolerate he states.  Chronic kidney disease stage IIIb post nephrectomy -Creatinine slightly down this morning back to baseline.  Continue with hydration prior to procedure.  OSA -Not on CPAP, encourage    For questions or updates, please contact [place] Please consult [email or web address] for contact info under        Signed, Candee Furbish, MD  07/05/2020, 8:52 AM

## 2020-07-05 NOTE — Discharge Instructions (Addendum)
Coronary Angiogram With Stent Coronary angiogram with stent placement is a procedure to widen or open a narrow blood vessel of the heart (coronary artery). Arteries may become blocked by cholesterol buildup (plaques) in the lining of the artery wall. When a coronary artery becomes partially blocked, blood flow to that area decreases. This may lead to chest pain or a heart attack (myocardial infarction). A stent is a small piece of metal that looks like mesh or spring. Stent placement may be done as treatment after a heart attack, or to prevent a heart attack if a blocked artery is found by a coronary angiogram. Let your health care provider know about:  Any allergies you have, including allergies to medicines or contrast dye.  All medicines you are taking, including vitamins, herbs, eye drops, creams, and over-the-counter medicines.  Any problems you or family members have had with anesthetic medicines.  Any blood disorders you have.  Any surgeries you have had.  Any medical conditions you have, including kidney problems or kidney failure.  Whether you are pregnant or may be pregnant.  Whether you are breastfeeding. What are the risks? Generally, this is a safe procedure. However, serious problems may occur, including:  Damage to nearby structures or organs, such as the heart, blood vessels, or kidneys.  A return of blockage.  Bleeding, infection, or bruising at the insertion site.  A collection of blood under the skin (hematoma) at the insertion site.  A blood clot in another part of the body.  Allergic reaction to medicines or dyes.  Bleeding into the abdomen (retroperitoneal bleeding).  Stroke (rare).  Heart attack (rare). What happens before the procedure? Staying hydrated Follow instructions from your health care provider about hydration, which may include:  Up to 2 hours before the procedure - you may continue to drink clear liquids, such as water, clear fruit juice,  black coffee, and plain tea.  Eating and drinking restrictions Follow instructions from your health care provider about eating and drinking, which may include:  8 hours before the procedure - stop eating heavy meals or foods, such as meat, fried foods, or fatty foods.  6 hours before the procedure - stop eating light meals or foods, such as toast or cereal.  2 hours before the procedure - stop drinking clear liquids. Medicines Ask your health care provider about:  Changing or stopping your regular medicines. This is especially important if you are taking diabetes medicines or blood thinners.  Taking medicines such as aspirin and ibuprofen. These medicines can thin your blood. Do not take these medicines unless your health care provider tells you to take them. ? Generally, aspirin is recommended before a thin tube, called a catheter, is passed through a blood vessel and inserted into the heart (cardiac catheterization).  Taking over-the-counter medicines, vitamins, herbs, and supplements. General instructions  Do not use any products that contain nicotine or tobacco for at least 4 weeks before the procedure. These products include cigarettes, e-cigarettes, and chewing tobacco. If you need help quitting, ask your health care provider.  Plan to have someone take you home from the hospital or clinic.  If you will be going home right after the procedure, plan to have someone with you for 24 hours.  You may have tests and imaging procedures.  Ask your health care provider: ? How your insertion site will be marked. Ask which artery will be used for the procedure. ? What steps will be taken to help prevent infection. These may include:    Removing hair at the insertion site.  Washing skin with a germ-killing soap.  Taking antibiotic medicine. What happens during the procedure?   An IV will be inserted into one of your veins.  Electrodes may be placed on your chest to monitor your  heart rate during the procedure.  You will be given one or more of the following: ? A medicine to help you relax (sedative). ? A medicine to numb the area (local anesthetic) for catheter insertion.  A small incision will be made for catheter insertion.  The catheter will be inserted into an artery using a guide wire. The location may be in your groin, your wrist, or the fold of your arm (near your elbow).  An X-ray procedure (fluoroscopy) will be used to help guide the catheter to the opening of the heart arteries.  A dye will be injected into the catheter. X-rays will be taken. The dye helps to show where any narrowing or blockages are located in the arteries.  Tell your health care provider if you have chest pain or trouble breathing.  A tiny wire will be guided to the blocked spot, and a balloon will be inflated to make the artery wider.  The stent will be expanded to crush the plaques into the wall of the vessel. The stent will hold the area open and improve the blood flow. Most stents have a drug coating to reduce the risk of the stent narrowing over time.  The artery may be made wider using a drill, laser, or other tools that remove plaques.  The catheter will be removed when the blood flow improves. The stent will stay where it was placed, and the lining of the artery will grow over it.  A bandage (dressing) will be placed on the insertion site. Pressure will be applied to stop bleeding.  The IV will be removed. This procedure may vary among health care providers and hospitals. What happens after the procedure?  Your blood pressure, heart rate, breathing rate, and blood oxygen level will be monitored until you leave the hospital or clinic.  If the procedure is done through the leg, you will lie flat in bed for a few hours or for as long as told by your health care provider. You will be instructed not to bend or cross your legs.  The insertion site and the pulse in your foot  or wrist will be checked often.  You may have more blood tests, X-rays, and a test that records the electrical activity of your heart (electrocardiogram, or ECG).  See first page of After-Visit Summary for activity instructions Summary  Coronary angiogram with stent placement is a procedure to widen or open a narrowed coronary artery. This is done to treat heart problems.  Before the procedure, let your health care provider know about all the medical conditions and surgeries you have or have had.  This is a safe procedure. However, some problems may occur, including damage to nearby structures or organs, bleeding, blood clots, or allergies.  Follow your health care provider's instructions about eating, drinking, medicines, and other lifestyle changes, such as quitting tobacco use before the procedure. This information is not intended to replace advice given to you by your health care provider. Make sure you discuss any questions you have with your health care provider. Document Revised: 05/28/2019 Document Reviewed: 05/28/2019 Elsevier Patient Education  [employer]  This sheet gives you information about how to care for yourself  after your procedure. Your health care provider may also give you more specific instructions. If you have problems or questions, contact your health care provider. What can I expect after the procedure? After the procedure, it is common to have:  Bruising and tenderness at the catheter insertion area. Follow these instructions at home: Medicines  Take over-the-counter and prescription medicines only as told by your health care provider. Insertion site care  Follow instructions from your health care provider about how to take care of your insertion site. Make sure you: ? Wash your hands with soap and water before you change your bandage (dressing). If soap and water are not available, use hand sanitizer. ? Change your dressing as  told by your health care provider. ? Leave stitches (sutures), skin glue, or adhesive strips in place. These skin closures may need to stay in place for 2 weeks or longer. If adhesive strip edges start to loosen and curl up, you may trim the loose edges. Do not remove adhesive strips completely unless your health care provider tells you to do that.  Check your insertion site every day for signs of infection. Check for: ? Redness, swelling, or pain. ? Fluid or blood. ? Pus or a bad smell. ? Warmth.  Do not take baths, swim, or use a hot tub until your health care provider approves.  You may shower 24-48 hours after the procedure, or as directed by your health care provider. ? Remove the dressing and gently wash the site with plain soap and water. ? Pat the area dry with a clean towel. ? Do not rub the site. That could cause bleeding.  Do not apply powder or lotion to the site. Activity   For 24 hours after the procedure, or as directed by your health care provider: ? Do not flex or bend the affected arm. ? Do not push or pull heavy objects with the affected arm. ? Do not drive yourself home from the hospital or clinic.  ? Do not operate machinery or power tools. ? Otherwise see first page of After-Visit Summary for activity instructions  Ask your health care provider when it is okay to: ? Return to work or school. ? Resume usual physical activities or sports. ? Resume sexual activity. General instructions  If the catheter site starts to bleed, raise your arm and put firm pressure on the site. If the bleeding does not stop, get help right away. This is a medical emergency.  If you went home on the same day as your procedure, a responsible adult should be with you for the first 24 hours after you arrive home.  Keep all follow-up visits as told by your health care provider. This is important. Contact a health care provider if:  You have a fever.  You have redness, swelling, or  yellow drainage around your insertion site. Get help right away if:  You have unusual pain at the radial site.  The catheter insertion area swells very fast.  The insertion area is bleeding, and the bleeding does not stop when you hold steady pressure on the area.  Your arm or hand becomes pale, cool, tingly, or numb. These symptoms may represent a serious problem that is an emergency. Do not wait to see if the symptoms will go away. Get medical help right away. Call your local emergency services (911 in the [place] Do not drive yourself to the hospital. Summary  After the procedure, it is common to have bruising and tenderness  at the site.  Follow instructions from your health care provider about how to take care of your radial site wound. Check the wound every day for signs of infection.  Do not lift anything that is heavier than 10 lb (4.5 kg), or the limit that you are told, until your health care provider says that it is safe. This information is not intended to replace advice given to you by your health care provider. Make sure you discuss any questions you have with your health care provider. Document Revised: 12/12/2017 Document Reviewed: 12/12/2017 Elsevier Patient Education  2020 [employer].

## 2020-07-05 NOTE — Progress Notes (Signed)
TR band removed per protocol. Tegaderm and 2x2 in place. Patient ambulated ~355ft without difficulty. VSS. Discharge instructions reviewed with patient and his daughter. All questions answered. Patient discharged via wheelchair in stable condition into the care of his daughter.

## 2020-07-05 NOTE — H&P (View-Only) (Signed)
Progress Note  Patient Name: Sean Hammond Date of Encounter: 07/05/2020  [facility] Cardiologist: Candee Furbish, MD   Subjective   No chest pain in chair, talkative this morning.  Cardiac rehab in room.  Ambulating well.  Inpatient Medications    Scheduled Meds:  [START ON 07/06/2020] aspirin  81 mg Oral Daily   clopidogrel  75 mg Oral Q breakfast   heparin  5,000 Units Subcutaneous Q8H   metoprolol succinate  25 mg Oral Daily   pantoprazole  40 mg Oral QPC breakfast   sodium chloride flush  3 mL Intravenous Q12H   sodium chloride flush  3 mL Intravenous Q12H   tamsulosin  0.4 mg Oral Daily   Continuous Infusions:  sodium chloride     sodium chloride     sodium chloride 1 mL/kg/hr (07/05/20 0819)   PRN Meds: sodium chloride, sodium chloride, acetaminophen, diphenhydrAMINE, nitroGLYCERIN, ondansetron (ZOFRAN) IV, sodium chloride flush, sodium chloride flush   Vital Signs    Vitals:   07/04/20 1305 07/04/20 1948 07/05/20 0501 07/05/20 0751  BP: 138/75 (!) 142/80 138/77 (!) 143/85  Pulse: 75 76 92 64  Resp: 15 16 18 20   Temp: 98.2 F (36.8 C) 98 F (36.7 C) 98.5 F (36.9 C) 98 F (36.7 C)  TempSrc: Oral Oral Oral Oral  SpO2: 97% 99% 99% 100%  Weight:   87.5 kg   Height:        Intake/Output Summary (Last 24 hours) at 07/05/2020 0852 Last data filed at 07/05/2020 0502 Gross per 24 hour  Intake 1018.51 ml  Output 3100 ml  Net -2081.49 ml   Last 3 Weights 07/05/2020 07/04/2020 07/03/2020  Weight (lbs) 193 lb 188 lb 15 oz 191 lb 4.8 oz  Weight (kg) 87.544 kg 85.7 kg 86.773 kg      Telemetry    Sinus rhythm- Personally Reviewed  ECG    No new- Personally Reviewed  Physical Exam   GEN: No acute distress.   Neck: No JVD Cardiac: RRR, no murmurs, rubs, or gallops.  Respiratory: Clear to auscultation bilaterally. GI: Soft, nontender, non-distended  MS: No edema; No deformity. Neuro:  Nonfocal  Psych: Normal affect   Labs    High  Sensitivity Troponin:   Recent Labs  Lab 06/30/20 1752 06/30/20 1919  TROPONINIHS 463* 526*      Chemistry Recent Labs  Lab 06/30/20 1752 07/01/20 0301 07/03/20 0352 07/04/20 0855 07/05/20 0339  NA 139   < > 138 136 139  K 4.2   < > 4.3 4.4 4.2  CL 106   < > 107 106 108  CO2 25   < > 22 23 22   GLUCOSE 95   < > 117* 100* 114*  BUN 18   < > 22 31* 26*  CREATININE 1.80*   < > 1.87* 1.92* 1.77*  CALCIUM 9.5   < > 9.3 9.2 9.0  PROT 6.4*  --   --   --   --   ALBUMIN 3.7  --   --   --   --   AST 27  --   --   --   --   ALT 19  --   --   --   --   ALKPHOS 58  --   --   --   --   BILITOT 0.6  --   --   --   --   GFRNONAA 36*   < > 34* 33* 36*  GFRAA 41*   < > 39* 38* 42*  ANIONGAP 8   < > 9 7 9    < > = values in this interval not displayed.     Hematology Recent Labs  Lab 07/03/20 0352 07/04/20 0855 07/05/20 0339  WBC 5.2 6.0 4.9  RBC 4.40 4.30 4.11*  HGB 13.8 12.9* 12.9*  HCT 40.4 39.4 37.2*  MCV 91.8 91.6 90.5  MCH 31.4 30.0 31.4  MCHC 34.2 32.7 34.7  RDW 13.4 13.5 13.4  PLT 171 174 173    BNPNo results for input(s): BNP, PROBNP in the last 168 hours.   DDimer No results for input(s): DDIMER in the last 168 hours.   Radiology    No results found.  Cardiac Studies   Cardiac catheterization reviewed  Patient Profile     79 y.o. male with progressive coronary artery disease and/catheterization 2017 here for staged PCI, chronic kidney disease stage IIIb, hyperlipidemia, statin intolerance  Assessment & Plan    Progressive coronary artery disease/non-ST elevation myocardial infarction -Second part of staged PCI today.  NPO.  Hydration.  Creatinine has come down this morning.  Compatible with previous creatinine levels.  Discussed with him disease process and how it is very important for him to lower his lipids, PCSK9. -Prior DES to circumflex 2013 -RCA intervention last week. -Personally reviewed prior cardiac catheterization from 2017, impressive  increase in CAD over that time.  Risk factor modification.  Dyslipidemia -PCSK9 as outpatient.  We have discussed previously several times in clinic.  He will now visit lipid clinic.  Statin intolerance -Has tried several different statins, unable to tolerate he states.  Chronic kidney disease stage IIIb post nephrectomy -Creatinine slightly down this morning back to baseline.  Continue with hydration prior to procedure.  OSA -Not on CPAP, encourage    For questions or updates, please contact [place] Please consult [email or web address] for contact info under        Signed, Candee Furbish, MD  07/05/2020, 8:52 AM

## 2020-07-05 NOTE — Discharge Summary (Addendum)
Discharge Summary    Patient ID: Sean Hammond MRN: [id number]; DOB: Nov 25, 1940  Admit date: 06/30/2020 Discharge date: 07/05/2020  Primary Care Provider: Josetta Huddle, MD  Primary Cardiologist: Candee Furbish, MD  Primary Electrophysiologist:  None   Discharge Diagnoses    Principal Problem:   Non-ST elevation (NSTEMI) myocardial infarction [facility]) Active Problems:   Coronary atherosclerosis of native coronary artery   Shortness of breath on exertion   Hyperlipidemia   Moderate obstructive sleep apnea   CKD (chronic kidney disease), stage III   Chest pain  Diagnostic Studies/Procedures    [facility] 07/01/20  Subtotal occlusion with greater than 95% stenosis from proximal to mid RCA.  Week left-to-right collaterals are noted.  Successful stenting of the right coronary with reduction in stenosis to 0% and TIMI grade III flow using a 30 x 2.75 mm Onyx postdilated to 3.0 mm in diameter.  Left main is widely patent  LAD contains moderate to moderately severe mid atherosclerosis.  No high-grade obstruction is noted.  Circumflex contains mid vessel stents.  The stents started just below the first obtuse marginal.  The first marginal contains a proximal 85% stenosis.  The circumflex proximal to the origin of the first obtuse marginal contains eccentric 70 to 80% stenosis.  The obtuse marginal and circumflex will need percutaneous intervention.  Should be staged he electively performed after 72 hours post contrast exposure on this procedure.  Inferior wall hypokinesis.  EF is 50%.  RECOMMENDATIONS:  Aspirin and Plavix for least 12 months and eventually consider Plavix monotherapy.  If no recurrent angina with ambulation tomorrow, will be okay to discharge the patient and have him return next week for PCI on circumflex and obtuse marginal.  If he has angina with physical activity prior to discharge, he should be kept in house over the weekend to undergo staged PCI on Monday.  If he has  elective PCI next week it will be performed by me later in the week.  If he stays over the weekend, he will need to be put on the schedule of one of the interventional cardiology team on Monday.  Aggressive risk factor modification.  [facility] 07/05/20  A stent was successfully placed.    First obtuse marginal 85% mid stenosis reduced to 0% with TIMI grade III flow using a 2.25 x 12 Onyx deployed at 12 atm.  RECOMMENDATIONS:  Aspirin and Plavix x12 months.  Then consider decreasing to Plavix monotherapy thereafter.  Plan discharge later today.   _____________   History of Present Illness     Sean Hammond is a 79 y.o. male with CAD with overlapping DES to Cx in 2013, HLD intolerant to statins and declined PCSK9, OSA not on CPAP, CKD stage 3a s/p R nephrectomy who was admitted from the office for chest pain and SOB concerning for unstable angina. Upon arrival to the hospital, he was found to have an elevated troponin concerning for NSTEMI. He was placed on IV heparin with plan for cath.  Hospital Course     1. NSTEMI/CAD (trop [phone]) - underwent initial cath 07/01/20 with subtotal RCA s/p DES, moderate 60% mLAD, 75-80% prox-mid Cx and 85% OM1 (latter of which could be causing symptoms as well), LVEF 60% with inferobasal hypokinesis - he underwent planned staged PCI/DES to OM1 today (07/05/20) - recommended to continue ASA + new addition of Plavix for 12 months, then consider decreasing to Plavix monotherapy thereafter  2. Hyperlipidemia - lipid profile 07/01/20 with LDL of 104 -> intolerant  of statins, previously declined PCSK9i - recommend revisiting PCSK9i as OP given progressive CAD  - sent message to scheduling to help arrange  3. CKD stage III s/p R nephrectomy 11/2019 (baseline Cr ~1.8) - patient's Cr remained relatively stable this admission between the 1.6-1.9 range - per Dr. Tamala Julian, recommend BMET in 48-72 hours post-cath - have arranged for 07/07/20  4. OSA - not on CPAP,  continued encouragement to revisit  He tolerated staged PCI well and has remained hemodynamically stable post-cath. My colleague on call this evening will assess cath site prior to formal discharge home, otherwise discharge has been finalized. TOC call request placed, along with request for office to call with lipid clinic appt. Plavix rx was sent in. Ipratropium spray was removed from his list since he indicated he is no longer taking this.  Dr. [name] has seen and examined the patient today and feels he is stable for discharge.  Did the patient have an acute coronary syndrome (MI, NSTEMI, STEMI, etc) this admission?:  Yes                               AHA/ACC Clinical Performance & Quality Measures: 5. Aspirin prescribed? - Yes 6. ADP Receptor Inhibitor (Plavix/Clopidogrel, Brilinta/Ticagrelor or Effient/Prasugrel) prescribed (includes medically managed patients)? - Yes 7. Beta Blocker prescribed? - Yes 8. High Intensity Statin (Lipitor 40-16m or Crestor 20-479m prescribed? - No - statin intolerance, plan to assess for PCSK9i as outpatient 9. EF assessed during THIS hospitalization? - Yes 10. For EF <40%, was ACEI/ARB prescribed? - Not Applicable (EF >/= 4001%11. For EF <40%, Aldosterone Antagonist (Spironolactone or Eplerenone) prescribed? - Not Applicable (EF >/= 4060%12. Cardiac Rehab Phase II ordered (including medically managed patients)? - Yes   _____________  Discharge Vitals Blood pressure 137/78, pulse 70, temperature 97.7 F (36.5 C), temperature source Oral, resp. rate 20, height _0  (1.854 m), weight 87.5 kg, SpO2 98 %.  Filed Weights   07/03/20 0656 07/04/20 0500 07/05/20 0501  Weight: 86.8 kg 85.7 kg 87.5 kg    Labs & Radiologic Studies    CBC Recent Labs    07/04/20 0855 07/05/20 0339  WBC 6.0 4.9  HGB 12.9* 12.9*  HCT 39.4 37.2*  MCV 91.6 90.5  PLT 174 17109 Basic Metabolic Panel Recent Labs    07/04/20 0855 07/05/20 0339  NA 136 139  K 4.4 4.2  CL  106 108  CO2 23 22  GLUCOSE 100* 114*  BUN 31* 26*  CREATININE 1.92* 1.77*  CALCIUM 9.2 9.0   Liver Function Tests No results for input(s): AST, ALT, ALKPHOS, BILITOT, PROT, ALBUMIN in the last 72 hours. No results for input(s): LIPASE, AMYLASE in the last 72 hours. High Sensitivity Troponin:   Recent Labs  Lab 06/30/20 1752 06/30/20 1919  TROPONINIHS 463* 526*    BNP Invalid input(s): POCBNP D-Dimer No results for input(s): DDIMER in the last 72 hours. Hemoglobin A1C No results for input(s): HGBA1C in the last 72 hours. Fasting Lipid Panel No results for input(s): CHOL, HDL, LDLCALC, TRIG, CHOLHDL, LDLDIRECT in the last 72 hours. Thyroid Function Tests No results for input(s): TSH, T4TOTAL, T3FREE, THYROIDAB in the last 72 hours.  Invalid input(s): FREET3 _____________  CARDIAC CATHETERIZATION  Result Date: 07/05/2020  A stent was successfully placed.   First obtuse marginal 85% mid stenosis reduced to 0% with TIMI grade III flow using a 2.25 x 12 Onyx  deployed at 12 atm. RECOMMENDATIONS:  Aspirin and Plavix x12 months.  Then consider decreasing to Plavix monotherapy thereafter.  Plan discharge later today.   CARDIAC CATHETERIZATION  Result Date: 07/01/2020  Subtotal occlusion with greater than 95% stenosis from proximal to mid RCA.  Week left-to-right collaterals are noted.  Successful stenting of the right coronary with reduction in stenosis to 0% and TIMI grade III flow using a 30 x 2.75 mm Onyx postdilated to 3.0 mm in diameter.  Left main is widely patent  LAD contains moderate to moderately severe mid atherosclerosis.  No high-grade obstruction is noted.  Circumflex contains mid vessel stents.  The stents started just below the first obtuse marginal.  The first marginal contains a proximal 85% stenosis.  The circumflex proximal to the origin of the first obtuse marginal contains eccentric 70 to 80% stenosis.  The obtuse marginal and circumflex will need  percutaneous intervention.  Should be staged he electively performed after 72 hours post contrast exposure on this procedure.  Inferior wall hypokinesis.  EF is 50%. RECOMMENDATIONS:  Aspirin and Plavix for least 12 months and eventually consider Plavix monotherapy.  If no recurrent angina with ambulation tomorrow, will be okay to discharge the patient and have him return next week for PCI on circumflex and obtuse marginal.  If he has angina with physical activity prior to discharge, he should be kept in house over the weekend to undergo staged PCI on Monday.  If he has elective PCI next week it will be performed by me later in the week.  If he stays over the weekend, he will need to be put on the schedule of one of the interventional cardiology team on Monday.  Aggressive risk factor modification.  DG CHEST PORT 1 VIEW  Result Date: 06/30/2020 CLINICAL DATA:  Chest pain EXAM: PORTABLE CHEST 1 VIEW COMPARISON:  06/03/2020 FINDINGS: The heart size and mediastinal contours are within normal limits. Both lungs are clear. The visualized skeletal structures are unremarkable. IMPRESSION: No active disease. Electronically Signed   By: Donavan Foil M.D.   On: 06/30/2020 19:29   Disposition   Pt is being discharged home today in good condition.  Follow-up Plans & Appointments     Follow-up Information    [facility] Follow up.   Specialty: Cardiology Why: [facility] - we have scheduled an appointment on Wednesday August 18th at [place] for labwork. Please arrive 10 minutes early to check in. Contact information: [address], [address] [place] [phone]       Burtis Junes, NP Follow up.   Specialties: Nurse Practitioner, Interventional Cardiology, Cardiology, Radiology Why: A follow-up has been arranged for you on Wednesday Jul 14, 2020 at 10:45 AM (Arrive by 10:30 AM). This is with Truitt Merle, one of our nurse practitioners that  works closely with Dr. [name]. Contact information: [place]. [address] [address] [phone]        [facility].   Why: The office will also call you to set up an appointment with one of our pharmacy team members to discuss your cholesterol management and additional options for treatment.             Discharge Instructions    Amb Referral to Cardiac Rehabilitation   Complete by: As directed    Referring to [facility] CRP 2   Diagnosis: Coronary Stents   After initial evaluation and assessments completed: Virtual Based  Care may be provided alone or in conjunction with Phase 2 Cardiac Rehab based on patient barriers.: Yes   Diet - low sodium heart healthy   Complete by: As directed    Discharge instructions   Complete by: As directed    You were started on a new medicine called clopidogrel/Plavix to take in addition to your aspirin. If you notice any bleeding such as blood in stool, black tarry stools, blood in urine, nosebleeds or any other unusual bleeding, call your doctor immediately. It is not normal to have this kind of bleeding while on a blood thinner and usually indicates there is an underlying problem with one of your body systems that needs to be checked out.   Ipratropium spray was removed from your list since you indicated you're no longer taking this.   Increase activity slowly   Complete by: As directed    No driving for 1 week. No lifting over 10 lbs for 2 weeks. No sexual activity for 2 weeks. Keep procedure site clean & dry. If you notice increased pain, swelling, bleeding or pus, call/return!  You may shower, but no soaking baths/hot tubs/pools for 1 week.      Discharge Medications   Allergies as of 07/05/2020      Reactions   Beef-derived Products Anaphylaxis, Hives, Shortness Of Breath, Rash   Because of a tick bite, the patient now has "Alpha-gal allergy"   Pork-derived Products Anaphylaxis, Hives, Shortness Of  Breath, Rash   Because of a tick bite, the patient now has "Alpha-gal allergy"   Codeine Other (See Comments)   Surgeon advised against this   Crestor [rosuvastatin] Other (See Comments)   Muscle aches   Dilaudid [hydromorphone Hcl] Hives   All-over hives   Hydrocodone Itching   Lipitor [atorvastatin] Other (See Comments)   Muscle aches   Percocet [oxycodone-acetaminophen] Itching   Pitavastatin Other (See Comments)   Leg pain      Medication List    STOP taking these medications   ipratropium 0.06 % nasal spray Commonly known as: ATROVENT     TAKE these medications   aspirin EC 81 MG tablet Take 1 tablet (81 mg total) by mouth daily.   clopidogrel 75 MG tablet Commonly known as: PLAVIX Take 1 tablet (75 mg total) by mouth daily.   diphenhydrAMINE 25 MG tablet Commonly known as: BENADRYL Take 25 mg by mouth every 6 (six) hours as needed for allergies.   EpiPen 2-Pak 0.3 mg/0.3 mL Soaj injection Generic drug: EPINEPHrine Inject 0.3 mg into the muscle once as needed (for an anaphylactic reaction).   metoprolol succinate 25 MG 24 hr tablet Commonly known as: TOPROL-XL TAKE 1 TABLET BY MOUTH EVERY DAY   MULTIVITAMIN PO Take 1 tablet by mouth daily.   nitroGLYCERIN 0.4 MG SL tablet Commonly known as: NITROSTAT PLACE 1 TABLET UNDER TONGUE EVERY 5 MINUTES AS NEEDED FOR CHEST PAIN MAX 3 TABS/15 MIN What changed: See the new instructions.   pantoprazole 40 MG tablet Commonly known as: PROTONIX Take 1 tablet (40 mg total) by mouth daily after breakfast.   tamsulosin 0.4 MG Caps capsule Commonly known as: FLOMAX Take 1 capsule (0.4 mg total) by mouth daily.   traMADol 50 MG tablet Commonly known as: ULTRAM Take 50 mg by mouth every 6 (six) hours as needed (pain).   VITAMIN C PO Take 1 tablet by mouth daily.   VITAMIN D PO Take 1 tablet by mouth daily.  Outstanding Labs/Studies   BMET on 8/18   Duration of Discharge Encounter   Greater than  30 minutes including physician time.  Signed, Charlie Pitter, PA-C 07/05/2020, 5:43 PM   Personally seen and examined. Agree with above.   Doing well post catheterization, staged procedure.  Minimal dye exposure.  No chest pain no fevers chills nausea vomiting syncope bleeding shortness of breath.  See cath reports as above.  #1-RCA stent x2 #2-OM1 stent x1  Non-STEMI -Aspirin Plavix 12 months, Plavix monotherapy thereafter consideration  Hyperlipidemia -PCSK9 as outpatient.  Chronic kidney disease stage IIIb -Baseline creatinine approximately 1.8.  Hydration protocols in place.  No ACE inhibitor.  Okay for discharge.  Candee Furbish, MD

## 2020-07-05 NOTE — Interval H&P Note (Signed)
Cath Lab Visit (complete for each Cath Lab visit)  Clinical Evaluation Leading to the Procedure:   ACS: Yes.    Non-ACS:    Anginal Classification: CCS Hammond  Anti-ischemic medical therapy: Maximal Therapy (2 or more classes of medications)  Non-Invasive Test Results: No non-invasive testing performed  Prior CABG: No previous CABG      History and Physical Interval Note:  07/05/2020 10:36 AM  Sean Hammond  has presented today for surgery, with the diagnosis of cad.  The various methods of treatment have been discussed with the patient and family. After consideration of risks, benefits and other options for treatment, the patient has consented to  Procedure(s): CORONARY STENT INTERVENTION (N/A) as a surgical intervention.  The patient's history has been reviewed, patient examined, no change in status, stable for surgery.  I have reviewed the patient's chart and labs.  Questions were answered to the patient's satisfaction.     Sean Hammond

## 2020-07-05 NOTE — Progress Notes (Signed)
CARDIAC REHAB PHASE I   PRE:  Rate/Rhythm: 102 ST  In bathroom      MODE:  Ambulation: 400 ft   POST:  Rate/Rhythm: 79 SR  BP:  Supine:   Sitting: 163/97, 148/84  Standing:    SaO2:  0830-0915 Pt walked 400 ft on RA with steady gait and no CP. Tolerated well. Ex ed completed with pt who voiced understanding.    Graylon Good, RN BSN  07/05/2020 9:12 AM

## 2020-07-05 NOTE — CV Procedure (Addendum)
   85% mid first obtuse marginal reduced to 0% with TIMI grade III flow 12 x 2.25 mm Onyx deployed at 14 atm.  Moderate disease proximal to the origin of the obtuse marginal and proximal OM less than 50% narrowing will be managed medically.  Discharge later today per treating team.  IV hydration at 150 cc/h of normal saline x4 hours prior to discharge.  Arrange basic metabolic panel for 48 to 72 hours post this procedure as an outpatient.  If no cath site bleeding or angina, it will be okay to discharge  Total contrast used, 55 cc.

## 2020-07-06 ENCOUNTER — Other Ambulatory Visit: Payer: Self-pay | Admitting: Cardiology

## 2020-07-06 ENCOUNTER — Telehealth: Payer: Self-pay

## 2020-07-06 ENCOUNTER — Encounter (HOSPITAL_COMMUNITY): Payer: Self-pay | Admitting: Interventional Cardiology

## 2020-07-06 NOTE — Progress Notes (Deleted)
CARDIOLOGY OFFICE NOTE  Date:  07/06/2020    Sean Hammond Date of Birth: June 23, 1941 Medical Record #[id number]  PCP:  Josetta Huddle, MD  Cardiologist:  Servando Snare & ***    No chief complaint on file.   History of Present Illness: Sean Hammond is a 79 y.o. male who presents today for a ***  Diagnostic Studies/Procedures    LHC 07/01/20  Subtotal occlusion with greater than 95% stenosis from proximal to mid RCA. Week left-to-right collaterals are noted.  Successful stenting of the right coronary with reduction in stenosis to 0% and TIMI grade III flow using a 30 x 2.75 mm Onyx postdilated to 3.0 mm in diameter.  Left main is widely patent  LAD contains moderate to moderately severe mid atherosclerosis. No high-grade obstruction is noted.  Circumflex contains mid vessel stents. The stents started just below the first obtuse marginal. The first marginal contains a proximal 85% stenosis. The circumflex proximal to the origin of the first obtuse marginal contains eccentric 70 to 80% stenosis. The obtuse marginal and circumflex will need percutaneous intervention. Should be staged he electively performed after 72 hours post contrast exposure on this procedure.  Inferior wall hypokinesis. EF is 50%.  RECOMMENDATIONS:  Aspirin and Plavix for least 12 months and eventually consider Plavix monotherapy.  If no recurrent angina with ambulation tomorrow, will be okay to discharge the patient and have him return next week for PCI on circumflex and obtuse marginal. If he has angina with physical activity prior to discharge, he should be kept in house over the weekend to undergo staged PCI on Monday. If he has elective PCI next week it will be performed by me later in the week. If he stays over the weekend, he will need to be put on the schedule of one of the interventional cardiology team on Monday.  Aggressive risk factor modification.  LHC 07/05/20  A stent was  successfully placed.   First obtuse marginal 85% mid stenosis reduced to 0% with TIMI grade III flow using a 2.25 x 12 Onyx deployed at 12 atm.  RECOMMENDATIONS:  Aspirin and Plavix x12 months. Then consider decreasing to Plavix monotherapy thereafter.  Plan discharge later today.   _____________   History of Present Illness     Sean Hammond is a 79 y.o.malewithCAD with overlapping DESto Cx in 2013,HLD intolerant to statins and declined PCSK9, OSA not on CPAP, CKD stage 3a s/p R nephrectomy who was admitted from the office for chest pain andSOB concerning for unstable angina. Upon arrival to the hospital, he was found to have an elevated troponin concerning for NSTEMI. He was placed on IV heparin with plan for cath.  Hospital Course     1. NSTEMI/CAD(trop [id number]) - underwent initial cath 07/01/20 with subtotal RCA s/p DES, moderate 60% mLAD, 75-80% prox-mid Cx and 85% OM1 (latter of which could be causing symptoms as well), LVEF 60% with inferobasal hypokinesis - he underwent planned staged PCI/DES to OM1 today (07/05/20) - recommended to continue ASA + new addition of Plavix for 12 months, then consider decreasing to Plavix monotherapy thereafter  2. Hyperlipidemia - lipid profile 07/01/20 with LDL of 104 -> intolerant of statins, previously declined PCSK9i - recommend revisiting PCSK9i as OP given progressive CAD  - sent message to scheduling to help arrange  3. CKD stage III s/p R nephrectomy 11/2019 (baseline Cr ~1.8) - patient's Cr remained relatively stable this admission between the 1.6-1.9 range - per Dr. Tamala Julian,  recommend BMET in 48-72 hours post-cath - have arranged for 07/07/20  4. OSA - not on CPAP, continued encouragement to revisit  He tolerated staged PCI well and has remained hemodynamically stable post-cath. My colleague on call this evening will assess cath site prior to formal discharge home, otherwise discharge has been finalized. TOC call request  placed, along with request for office to call with lipid clinic appt. Plavix rx was sent in. Ipratropium spray was removed from his list since he indicated he is no longer taking this.  Dr. [name] has seen and examined the patient today and feels he is stable for discharge.  Comes in today. Here with   Past Medical History:  Diagnosis Date  . Allergic rhinitis   . BPH (benign prostatic hyperplasia)    urologist-- dr t. [name]  . CAD (coronary artery disease) cardiologsit--- dr [name]   a. 12-14-2011 s/p PCI with DES x2 to midLCFx. b. NSTEMI 06/2020 s/p DES to RCA and staged DES to OM1.  . Chronic headaches    due to sinues  . ED (erectile dysfunction)   . GERD (gastroesophageal reflux disease)   . Hematuria   . History of acute pyelonephritis    08-11-2019  . History of echocardiogram    last one 03-22-2015   mild LVH,   ef 55-60%  . History of kidney stones   . History of melanoma excision    non-malignant excised from ear  . History of MI (myocardial infarction)    per pt remote hx yrs ago prior to 2013  . History of nonmelanoma skin cancer    per pt SCC and BCC from back, head, face  excision  . History of prostatitis   . Hydronephrosis, right   . Hyperlipidemia   . Moderate obstructive sleep apnea    not used cpap since 2014 bought bed that head would elevate   . Osteoarthritis of hand   . PONV (postoperative nausea and vomiting)    Pt may have been confused after surgery in the past  . Statin intolerance   . Wears glasses     Past Surgical History:  Procedure Laterality Date  . BONE CYST EXCISION  ~ 2004   left knee  . CARDIAC CATHETERIZATION N/A 05/09/2016   Procedure: Left Heart Cath and Coronary Angiography;  Surgeon: Jerline Pain, MD;  Location[facility];  Service: Cardiovascular;  Laterality: N/A;  . CHOLECYSTECTOMY N/A 03/23/2015   Procedure: LAPAROSCOPIC CHOLECYSTECTOMY;  Surgeon: Ralene Ok, MD;  Location: [facility];  Service: General;  Laterality:  N/A;  . CORONARY ANGIOPLASTY WITH STENT PLACEMENT  12/14/11   "2"  . CORONARY STENT INTERVENTION N/A 07/01/2020   Procedure: CORONARY STENT INTERVENTION;  Surgeon: Belva Crome, MD;  Location: [facility];  Service: Cardiovascular;  Laterality: N/A;  . CORONARY STENT INTERVENTION N/A 07/05/2020   Procedure: CORONARY STENT INTERVENTION;  Surgeon: Belva Crome, MD;  Location: [facility];  Service: Cardiovascular;  Laterality: N/A;  . CYSTOSCOPY/RETROGRADE/URETEROSCOPY Bilateral 09/05/2019   Procedure: CYSTOSCOPY/ BILATERAL RETROGRADE/ RIGHT  URETEROSCOPY with biopsy with laser ablation of tumor;  Surgeon: Alexis Frock, MD;  Location: [facility];  Service: Urology;  Laterality: Bilateral;  . ETHMOIDECTOMY Right 09/01/2019   Procedure: RIGHT REVISION ENDOSCOPIC ETHMOIDECTOMY;  Surgeon: Izora Gala, MD;  Location: [facility];  Service: ENT;  Laterality: Right;  . FRONTAL SINUSOTOMY Bilateral 07-23-2003   dr Constance Holster   re-do  . HOLMIUM LASER APPLICATION  Right 09/05/2019   Procedure: HOLMIUM LASER APPLICATION;  Surgeon: Alexis Frock, MD;  Location: [facility];  Service: Urology;  Laterality: Right;  . KNEE ARTHROSCOPY Left ` 2010  . LEFT HEART CATH AND CORONARY ANGIOGRAPHY N/A 07/01/2020   Procedure: LEFT HEART CATH AND CORONARY ANGIOGRAPHY;  Surgeon: Belva Crome, MD;  Location: [facility];  Service: Cardiovascular;  Laterality: N/A;  . LEFT HEART CATHETERIZATION WITH CORONARY ANGIOGRAM N/A 12/14/2011   Procedure: LEFT HEART CATHETERIZATION WITH CORONARY ANGIOGRAM;  Surgeon: Candee Furbish, MD;  Location: [facility];  Service: Cardiovascular;  Laterality: N/A;  . PERCUTANEOUS CORONARY STENT INTERVENTION (PCI-S)  12/14/2011   Procedure: PERCUTANEOUS CORONARY STENT INTERVENTION (PCI-S);  Surgeon: Candee Furbish, MD;  Location: [facility];  Service: Cardiovascular;;  . ROBOT ASSITED LAPAROSCOPIC NEPHROURETERECTOMY Right 11/28/2019    Procedure: XI ROBOT ASSITED LAPAROSCOPIC NEPHROURETERECTOMY;  Surgeon: Alexis Frock, MD;  Location: [facility];  Service: Urology;  Laterality: Right;  3 HRS  . SEPTOPLASTY WITH ETHMOIDECTOMY, AND MAXILLARY ANTROSTOMY  09-22-2002   dr Constance Holster   and sinusotomy  . TOTAL KNEE ARTHROPLASTY Left 05/09/2017   Procedure: LEFT TOTAL KNEE ARTHROPLASTY;  Surgeon: Latanya Maudlin, MD;  Location: [facility];  Service: Orthopedics;  Laterality: Left;     Medications: No outpatient medications have been marked as taking for the 07/14/20 encounter (Appointment) with Burtis Junes, NP.     Allergies: Allergies  Allergen Reactions  . Beef-Derived Products Anaphylaxis, Hives, Shortness Of Breath and Rash    Because of a tick bite, the patient now has "Alpha-gal allergy"  . Pork-Derived Products Anaphylaxis, Hives, Shortness Of Breath and Rash    Because of a tick bite, the patient now has "Alpha-gal allergy"  . Codeine Other (See Comments)    Surgeon advised against this  . Crestor [Rosuvastatin] Other (See Comments)    Muscle aches  . Dilaudid [Hydromorphone Hcl] Hives    All-over hives  . Hydrocodone Itching  . Lipitor [Atorvastatin] Other (See Comments)    Muscle aches   . Percocet [Oxycodone-Acetaminophen] Itching  . Pitavastatin Other (See Comments)    Leg pain    Social History: The patient  reports that he quit smoking about 45 years ago. His smoking use included cigarettes. He has a 24.00 pack-year smoking history. He has never used smokeless tobacco. He reports previous alcohol use. He reports that he does not use drugs.   Family History: The patient's ***family history includes Heart attack in his mother; Hypertension in his mother.   Review of Systems: Please see the history of present illness.   All other systems are reviewed and negative.   Physical Exam: VS:  There were no vitals taken for this visit. Marland Kitchen  BMI There is no height or weight on file to calculate BMI.  Wt Readings from  Last 3 Encounters:  07/05/20 193 lb (87.5 kg)  06/30/20 198 lb 3.2 oz (89.9 kg)  06/17/20 198 lb (89.8 kg)    General: Pleasant. Well developed, well nourished and in no acute distress.   HEENT: Normal.  Neck: Supple, no JVD, carotid bruits, or masses noted.  Cardiac: ***Regular rate and rhythm. No murmurs, rubs, or gallops. No edema.  Respiratory:  Lungs are clear to auscultation bilaterally with normal work of breathing.  GI: Soft and nontender.  MS: No deformity or atrophy. Gait and ROM intact.  Skin: Warm and dry. Color is normal.  Neuro:  Strength and sensation are intact and no gross focal  deficits noted.  Psych: Alert, appropriate and with normal affect.   LABORATORY DATA:  EKG:  EKG {ACTION; IS/IS IOM:[id number]} ordered today.  Personally reviewed by me. This demonstrates ***.  Lab Results  Component Value Date   WBC 4.9 07/05/2020   HGB 12.9 (L) 07/05/2020   HCT 37.2 (L) 07/05/2020   PLT 173 07/05/2020   GLUCOSE 114 (H) 07/05/2020   CHOL 158 07/01/2020   TRIG 49 07/01/2020   HDL 44 07/01/2020   LDLCALC 104 (H) 07/01/2020   ALT 19 06/30/2020   AST 27 06/30/2020   NA 139 07/05/2020   K 4.2 07/05/2020   CL 108 07/05/2020   CREATININE 1.77 (H) 07/05/2020   BUN 26 (H) 07/05/2020   CO2 22 07/05/2020   TSH 1.376 06/30/2020   INR 1.0 06/30/2020   HGBA1C 5.7 (H) 06/30/2020     BNP (last 3 results) No results for input(s): BNP in the last 8760 hours.  ProBNP (last 3 results) No results for input(s): PROBNP in the last 8760 hours.   Other Studies Reviewed Today:   Assessment/Plan:    Current medicines are reviewed with the patient today.  The patient does not have concerns regarding medicines other than what has been noted above.  The following changes have been made:  See above.  Labs/ tests ordered today include:   No orders of the defined types were placed in this encounter.    Disposition:   FU with *** in {gen number [id number]} {Days to  years:10300}.   Patient is agreeable to this plan and will call if any problems develop in the interim.   SignedTruitt Merle, NP  07/06/2020 1:26 PM  [facility] [address] [place], [place]  [address] Phone: [phone] Fax: [phone]

## 2020-07-06 NOTE — Telephone Encounter (Signed)
**Note De-identified Ji Fairburn Obfuscation** Error

## 2020-07-06 NOTE — Telephone Encounter (Signed)
**Note De-Identified Keali Mccraw Obfuscation** Patient's wife Adonis Huguenin contacted regarding the pts discharge from [facility] on 07/05/2020.  Patient's wife Adonis Huguenin understands that the pt is to follow up with provider Truitt Merle, NP on 07/14/2020 at 10:45 at [address] in [place], [place], [address] Vivian understands the pts discharge instructions? Yes Adonis Huguenin understands the pts medications and regiment? Yes Adonis Huguenin understands to bring all of the pts medications to this visit? Yes  Ask patient:  Are you enrolled in My Chart : Yes

## 2020-07-07 ENCOUNTER — Other Ambulatory Visit: Payer: Self-pay

## 2020-07-07 ENCOUNTER — Other Ambulatory Visit: Payer: Medicare Other | Admitting: *Deleted

## 2020-07-07 DIAGNOSIS — N1832 Chronic kidney disease, stage 3b: Secondary | ICD-10-CM

## 2020-07-07 LAB — BASIC METABOLIC PANEL
BUN/Creatinine Ratio: 13 (ref 10–24)
BUN: 22 mg/dL (ref 8–27)
CO2: 22 mmol/L (ref 20–29)
Calcium: 9.5 mg/dL (ref 8.6–10.2)
Chloride: 104 mmol/L (ref 96–106)
Creatinine, Ser: 1.72 mg/dL — ABNORMAL HIGH (ref 0.76–1.27)
GFR calc Af Amer: 43 mL/min/{1.73_m2} — ABNORMAL LOW (ref >59)
GFR calc non Af Amer: 38 mL/min/{1.73_m2} — ABNORMAL LOW (ref >59)
Glucose: 82 mg/dL (ref 65–99)
Potassium: 4.7 mmol/L (ref 3.5–5.2)
Sodium: 137 mmol/L (ref 134–144)

## 2020-07-07 NOTE — Progress Notes (Signed)
Cardiology Office Note    Date:  07/13/2020   ID:  Sean, Hammond [birth year], MRN [id number]  PCP:  Josetta Huddle, MD  Cardiologist: Candee Furbish, MD EPS: None  Chief Complaint  Patient presents with  . Hospitalization Follow-up    History of Present Illness:  Sean Hammond is a 79 y.o. male with history of CAD overlapping DES Cfx EF 44% 2013. Repeat cath 04/2016 patent circumflex stent other coronaries normal, normal LVEF. Hyperlipidemia intolerant to statins and declined PCSK9I, history of OSA not on CPAP, CKD stage 3-robotic lap right nephrectomy with ureterectomy 11/28/19.   I saw the patient in the office 06/30/2020 with unstable angina and arranged hospital admission for cardiac cath.  Troponins came back positive/NSTEMI treated with DES to the RCA 07/01/2020 with staged DES to [place] 07/05/2020, residual moderate to moderately severe mid LAD no high-grade lesion noted, inferior wall hypokinesis EF 50%.  Plan for Plavix and aspirin for at least 12 months and consider Plavix monotherapy after.  Scheduled to see lipid clinic for possible PCSK9 inhibitor.  Creatinine 1.77 at discharge. Crt 1.72 07/07/20.  Patient comes in for f/u. Doing well. No chest pain, dyspnea, dizziness or presyncope.Walking 1/2 mile twice a day and increasing daily. Eating healthier too. Had appt with Dr. [name] yest and afraid to take zetia because of a single kidney.   Past Medical History:  Diagnosis Date  . Allergic rhinitis   . BPH (benign prostatic hyperplasia)    urologist-- dr t. [name]  . CAD (coronary artery disease) cardiologsit--- dr [name]   a. 12-14-2011 s/p PCI with DES x2 to midLCFx. b. NSTEMI 06/2020 s/p DES to RCA and staged DES to OM1.  . Chronic headaches    due to sinues  . ED (erectile dysfunction)   . GERD (gastroesophageal reflux disease)   . Hematuria   . History of acute pyelonephritis    08-11-2019  . History of echocardiogram    last one 03-22-2015   mild LVH,   ef 55-60%  . History  of kidney stones   . History of melanoma excision    non-malignant excised from ear  . History of MI (myocardial infarction)    per pt remote hx yrs ago prior to 2013  . History of nonmelanoma skin cancer    per pt SCC and BCC from back, head, face  excision  . History of prostatitis   . Hydronephrosis, right   . Hyperlipidemia   . Moderate obstructive sleep apnea    not used cpap since 2014 bought bed that head would elevate   . Osteoarthritis of hand   . PONV (postoperative nausea and vomiting)    Pt may have been confused after surgery in the past  . Statin intolerance   . Wears glasses     Past Surgical History:  Procedure Laterality Date  . BONE CYST EXCISION  ~ 2004   left knee  . CARDIAC CATHETERIZATION N/A 05/09/2016   Procedure: Left Heart Cath and Coronary Angiography;  Surgeon: Jerline Pain, MD;  Location: [place] [facility];  Service: Cardiovascular;  Laterality: N/A;  . CHOLECYSTECTOMY N/A 03/23/2015   Procedure: LAPAROSCOPIC CHOLECYSTECTOMY;  Surgeon: Ralene Ok, MD;  Location: [facility];  Service: General;  Laterality: N/A;  . CORONARY ANGIOPLASTY WITH STENT PLACEMENT  12/14/11   "2"  . CORONARY STENT INTERVENTION N/A 07/01/2020   Procedure: CORONARY STENT INTERVENTION;  Surgeon: Belva Crome, MD;  Location: [place] [facility];  Service:  Cardiovascular;  Laterality: N/A;  . CORONARY STENT INTERVENTION N/A 07/05/2020   Procedure: CORONARY STENT INTERVENTION;  Surgeon: Belva Crome, MD;  Location: [place] [facility];  Service: Cardiovascular;  Laterality: N/A;  . CYSTOSCOPY/RETROGRADE/URETEROSCOPY Bilateral 09/05/2019   Procedure: CYSTOSCOPY/ BILATERAL RETROGRADE/ RIGHT  URETEROSCOPY with biopsy with laser ablation of tumor;  Surgeon: Alexis Frock, MD;  Location: [facility];  Service: Urology;  Laterality: Bilateral;  . ETHMOIDECTOMY Right 09/01/2019   Procedure: RIGHT REVISION ENDOSCOPIC ETHMOIDECTOMY;  Surgeon: Izora Gala, MD;  Location: [place];  Service: ENT;  Laterality: Right;  . FRONTAL SINUSOTOMY Bilateral 07-23-2003   dr Constance Holster   re-do  . HOLMIUM LASER APPLICATION Right 60/73/7106   Procedure: HOLMIUM LASER APPLICATION;  Surgeon: Alexis Frock, MD;  Location: [facility];  Service: Urology;  Laterality: Right;  . KNEE ARTHROSCOPY Left ` 2010  . LEFT HEART CATH AND CORONARY ANGIOGRAPHY N/A 07/01/2020   Procedure: LEFT HEART CATH AND CORONARY ANGIOGRAPHY;  Surgeon: Belva Crome, MD;  Location: [place] [facility];  Service: Cardiovascular;  Laterality: N/A;  . LEFT HEART CATHETERIZATION WITH CORONARY ANGIOGRAM N/A 12/14/2011   Procedure: LEFT HEART CATHETERIZATION WITH CORONARY ANGIOGRAM;  Surgeon: Candee Furbish, MD;  Location[facility];  Service: Cardiovascular;  Laterality: N/A;  . PERCUTANEOUS CORONARY STENT INTERVENTION (PCI-S)  12/14/2011   Procedure: PERCUTANEOUS CORONARY STENT INTERVENTION (PCI-S);  Surgeon: Candee Furbish, MD;  Location: [facility] CATH LAB;  Service: Cardiovascular;;  . ROBOT ASSITED LAPAROSCOPIC NEPHROURETERECTOMY Right 11/28/2019   Procedure: XI ROBOT ASSITED LAPAROSCOPIC NEPHROURETERECTOMY;  Surgeon: Alexis Frock, MD;  Location: [facility];  Service: Urology;  Laterality: Right;  3 HRS  . SEPTOPLASTY WITH ETHMOIDECTOMY, AND MAXILLARY ANTROSTOMY  09-22-2002   dr Constance Holster   and sinusotomy  . TOTAL KNEE ARTHROPLASTY Left 05/09/2017   Procedure: LEFT TOTAL KNEE ARTHROPLASTY;  Surgeon: Latanya Maudlin, MD;  Location: [facility];  Service: Orthopedics;  Laterality: Left;    Current Medications: Current Meds  Medication Sig  . Ascorbic Acid (VITAMIN C PO) Take 1 tablet by mouth daily.   Marland Kitchen aspirin EC 81 MG tablet Take 1 tablet (81 mg total) by mouth daily.  . clopidogrel (PLAVIX) 75 MG tablet Take 1 tablet (75 mg total) by mouth daily.  . diphenhydrAMINE (BENADRYL) 25 MG tablet Take 25 mg by mouth every 6 (six) hours as needed for allergies.   Marland Kitchen EPIPEN 2-PAK 0.3 MG/0.3ML SOAJ injection Inject  0.3 mg into the muscle once as needed (for an anaphylactic reaction).   . ezetimibe (ZETIA) 10 MG tablet Take 1 tablet (10 mg total) by mouth daily.  . metoprolol succinate (TOPROL-XL) 25 MG 24 hr tablet TAKE 1 TABLET BY MOUTH EVERY DAY  . Multiple Vitamin (MULTIVITAMIN PO) Take 1 tablet by mouth daily.   . nitroGLYCERIN (NITROSTAT) 0.4 MG SL tablet PLACE 1 TABLET UNDER TONGUE EVERY 5 MINUTES AS NEEDED FOR CHEST PAIN MAX 3 TABS/15 MIN  . pantoprazole (PROTONIX) 40 MG tablet TAKE 1 TABLET (40 MG TOTAL) BY MOUTH DAILY AFTER BREAKFAST.  . tamsulosin (FLOMAX) 0.4 MG CAPS capsule Take 1 capsule (0.4 mg total) by mouth daily.  . traMADol (ULTRAM) 50 MG tablet Take 50 mg by mouth every 6 (six) hours as needed (pain).   Marland Kitchen VITAMIN D PO Take 1 tablet by mouth daily.      Allergies:   Beef-derived products, Pork-derived products, Codeine, Crestor [rosuvastatin], Dilaudid [hydromorphone hcl], Hydrocodone, Lipitor [atorvastatin], Percocet [oxycodone-acetaminophen], and Pitavastatin  Social History   Socioeconomic History  . Marital status: Married    Spouse name: Not on file  . Number of children: Not on file  . Years of education: Not on file  . Highest education level: Not on file  Occupational History  . Not on file  Tobacco Use  . Smoking status: Former Smoker    Packs/day: 2.00    Years: 12.00    Pack years: 24.00    Types: Cigarettes    Quit date: 09/03/1974    Years since quitting: 45.8  . Smokeless tobacco: Never Used  Vaping Use  . Vaping Use: Never used  Substance and Sexual Activity  . Alcohol use: Not Currently  . Drug use: Never  . Sexual activity: Not on file  Other Topics Concern  . Not on file  Social History Narrative  . Not on file   Social Determinants of Health   Financial Resource Strain:   . Difficulty of Paying Living Expenses: Not on file  Food Insecurity:   . Worried About Charity fundraiser in the Last Year: Not on file  . Ran Out of Food in the Last  Year: Not on file  Transportation Needs:   . Lack of Transportation (Medical): Not on file  . Lack of Transportation (Non-Medical): Not on file  Physical Activity:   . Days of Exercise per Week: Not on file  . Minutes of Exercise per Session: Not on file  Stress:   . Feeling of Stress : Not on file  Social Connections:   . Frequency of Communication with Friends and Family: Not on file  . Frequency of Social Gatherings with Friends and Family: Not on file  . Attends Religious Services: Not on file  . Active Member of Clubs or Organizations: Not on file  . Attends Archivist Meetings: Not on file  . Marital Status: Not on file     Family History:  The patient's family history includes Heart attack in his mother; Hypertension in his mother.   ROS:   Please see the history of present illness.    ROS All other systems reviewed and are negative.   PHYSICAL EXAM:   VS:  BP 128/68   Pulse 93   Ht 6' 1" (1.854 m)   Wt 196 lb (88.9 kg)   SpO2 96%   BMI 25.86 kg/m   Physical Exam  GEN: Well nourished, well developed, in no acute distress  Neck: no JVD, carotid bruits, or masses Cardiac:RRR; no murmurs, rubs, or gallops  Respiratory:  clear to auscultation bilaterally, normal work of breathing GI: soft, nontender, nondistended, + BS Ext: Right arm bruised without hematoma or hemorrhage good radial and brachial pulses.  Lower extremities without cyanosis, clubbing, or edema, Good distal pulses bilaterally Neuro:  Alert and Oriented x 3 Psych: euthymic mood, full affect  Wt Readings from Last 3 Encounters:  07/13/20 196 lb (88.9 kg)  07/05/20 193 lb (87.5 kg)  06/30/20 198 lb 3.2 oz (89.9 kg)      Studies/Labs Reviewed:   EKG:  EKG is not ordered today.   Recent Labs: 06/30/2020: ALT 19; Magnesium 1.9; TSH 1.376 07/05/2020: Hemoglobin 12.9; Platelets 173 07/07/2020: BUN 22; Creatinine, Ser 1.72; Potassium 4.7; Sodium 137   Lipid Panel    Component Value Date/Time    CHOL 158 07/01/2020 0301   TRIG 49 07/01/2020 0301   HDL 44 07/01/2020 0301   CHOLHDL 3.6 07/01/2020 0301   VLDL 10 07/01/2020  0301   LDLCALC 104 (H) 07/01/2020 0301    Additional studies/ records that were reviewed today include:   LHC 07/01/20  Subtotal occlusion with greater than 95% stenosis from proximal to mid RCA.  Week left-to-right collaterals are noted.  Successful stenting of the right coronary with reduction in stenosis to 0% and TIMI grade III flow using a 30 x 2.75 mm Onyx postdilated to 3.0 mm in diameter.  Left main is widely patent  LAD contains moderate to moderately severe mid atherosclerosis.  No high-grade obstruction is noted.  Circumflex contains mid vessel stents.  The stents started just below the first obtuse marginal.  The first marginal contains a proximal 85% stenosis.  The circumflex proximal to the origin of the first obtuse marginal contains eccentric 70 to 80% stenosis.  The obtuse marginal and circumflex will need percutaneous intervention.  Should be staged he electively performed after 72 hours post contrast exposure on this procedure.  Inferior wall hypokinesis.  EF is 50%.   RECOMMENDATIONS:  Aspirin and Plavix for least 12 months and eventually consider Plavix monotherapy.  If no recurrent angina with ambulation tomorrow, will be okay to discharge the patient and have him return next week for PCI on circumflex and obtuse marginal.  If he has angina with physical activity prior to discharge, he should be kept in house over the weekend to undergo staged PCI on Monday.  If he has elective PCI next week it will be performed by me later in the week.  If he stays over the weekend, he will need to be put on the schedule of one of the interventional cardiology team on Monday.  Aggressive risk factor modification.   LHC 07/05/20  A stent was successfully placed.    First obtuse marginal 85% mid stenosis reduced to 0% with TIMI grade III flow using a  2.25 x 12 Onyx deployed at 12 atm.   RECOMMENDATIONS:  Aspirin and Plavix x12 months.  Then consider decreasing to Plavix monotherapy thereafter.  Plan discharge later today.       ASSESSMENT:    1. Coronary artery disease involving native coronary artery of native heart without angina pectoris   2. Hyperlipidemia, unspecified hyperlipidemia type   3. Stage 3 chronic kidney disease, unspecified whether stage 3a or 3b CKD      PLAN:  In order of problems listed above:  CAD with overlapping DES to Cx in 2013, admitted from the office with unstable angina but troponins were positive so NSTEMI 07/01/2020 treated with DES to the RCA 07/01/2020, with staged DES to the OM on 07/05/2020 aspirin and Plavix for at least 12 months and consider Plavix monotherapy thereafter.  No angina.  Exercising daily and watching his diet more closely.  Follow-up in 2 months.  Hyperlipidemia has been intolerant to statins and Zetia recommended by Dr. [name] but patient reluctant to take because of a single kidney.  He would prefer to try PCSK9 inhibitor.  CKD stage III status post right nephrectomy 11/2019 creatinine baseline about 1.8.  Creatinine 1.72 on 07/07/2020.      Medication Adjustments/Labs and Tests Ordered: Current medicines are reviewed at length with the patient today.  Concerns regarding medicines are outlined above.  Medication changes, Labs and Tests ordered today are listed in the Patient Instructions below. Patient Instructions  Medication Instructions:  Your physician recommends that you continue on your current medications as directed. Please refer to the Current Medication list given to you today.  *If you need  a refill on your cardiac medications before your next appointment, please call your pharmacy*   Lab Work: None If you have labs (blood work) drawn today and your tests are completely normal, you will receive your results only by: Marland Kitchen MyChart Message (if you have MyChart)  OR . A paper copy in the mail If you have any lab test that is abnormal or we need to change your treatment, we will call you to review the results.   Testing/Procedures: None   Follow-Up: At [facility], you and your health needs are our priority.  As part of our continuing mission to provide you with exceptional heart care, we have created designated Provider Care Teams.  These Care Teams include your primary Cardiologist (physician) and Advanced Practice Providers (APPs -  Physician Assistants and Nurse Practitioners) who all work together to provide you with the care you need, when you need it.   Your next appointment:   09/20/2020  The format for your next appointment:   In Person  Provider:   Candee Furbish, MD   Other Instructions None     Signed, Ermalinda Barrios, PA-C  07/13/2020 10:24 AM    [address], [place], [place]  [address] Phone: [phone]; Fax: [phone]

## 2020-07-12 ENCOUNTER — Telehealth (INDEPENDENT_AMBULATORY_CARE_PROVIDER_SITE_OTHER): Payer: Medicare Other | Admitting: Internal Medicine

## 2020-07-12 ENCOUNTER — Encounter: Payer: Self-pay | Admitting: Internal Medicine

## 2020-07-12 DIAGNOSIS — E785 Hyperlipidemia, unspecified: Secondary | ICD-10-CM

## 2020-07-12 DIAGNOSIS — N1831 Chronic kidney disease, stage 3a: Secondary | ICD-10-CM

## 2020-07-12 DIAGNOSIS — I200 Unstable angina: Secondary | ICD-10-CM

## 2020-07-12 DIAGNOSIS — I25119 Atherosclerotic heart disease of native coronary artery with unspecified angina pectoris: Secondary | ICD-10-CM | POA: Diagnosis not present

## 2020-07-12 DIAGNOSIS — T466X5A Adverse effect of antihyperlipidemic and antiarteriosclerotic drugs, initial encounter: Secondary | ICD-10-CM | POA: Diagnosis not present

## 2020-07-12 DIAGNOSIS — M791 Myalgia, unspecified site: Secondary | ICD-10-CM | POA: Diagnosis not present

## 2020-07-12 MED ORDER — EZETIMIBE 10 MG PO TABS: 10.0000 mg | ORAL_TABLET | Freq: Every day | ORAL | 3 refills | Status: DC

## 2020-07-12 NOTE — Patient Instructions (Signed)
Medication Instructions:  START zetia 10mg  daily  *If you need a refill on your cardiac medications before your next appointment, please call your pharmacy*   Lab Work: FASTING lipid panel in 3 months to check cholesterol  If you have labs (blood work) drawn today and your tests are completely normal, you will receive your results only by: Marland Kitchen MyChart Message (if you have MyChart) OR . A paper copy in the mail If you have any lab test that is abnormal or we need to change your treatment, we will call you to review the results.   Testing/Procedures: NONE   Follow-Up: At [facility], you and your health needs are our priority.  As part of our continuing mission to provide you with exceptional heart care, we have created designated Provider Care Teams.  These Care Teams include your primary Cardiologist (physician) and Advanced Practice Providers (APPs -  Physician Assistants and Nurse Practitioners) who all work together to provide you with the care you need, when you need it.  We recommend signing up for the patient portal called "MyChart".  Sign up information is provided on this After Visit Summary.  MyChart is used to connect with patients for Virtual Visits (Telemedicine).  Patients are able to view lab/test results, encounter notes, upcoming appointments, etc.  Non-urgent messages can be sent to your provider as well.   To learn more about what you can do with MyChart, go to [email or web address].    Your next appointment:   3 month(s) - lipid clinic  The format for your next appointment:   Virtual Visit   Provider:   K. Mali Hilty, MD   Other Instructions

## 2020-07-12 NOTE — Addendum Note (Signed)
Addended by: Fidel Levy on: 07/12/2020 08:42 AM   Modules accepted: Orders

## 2020-07-12 NOTE — Progress Notes (Signed)
Virtual Visit via Telephone Note   This visit type was conducted due to national recommendations for restrictions regarding the COVID-19 Pandemic (e.g. social distancing) in an effort to limit this patient's exposure and mitigate transmission in our community.  Due to his co-morbid illnesses, this patient is at least at moderate risk for complications without adequate follow up.  This format is felt to be most appropriate for this patient at this time.  The patient did not have access to video technology/had technical difficulties with video requiring transitioning to audio format only (telephone).  All issues noted in this document were discussed and addressed.  No physical exam could be performed with this format.  Please refer to the patient's chart for his  consent to telehealth for [facility].   Date:  07/12/2020   ID:  Sean Hammond, DOB 28-Oct-1941, MRN [id number] The patient was identified using 2 identifiers.  Evaluation Performed:  New Patient Evaluation  Patient Location:  [address] [id number]  Provider location:   [address] [place], [place] [address]  PCP:  Josetta Huddle, MD  Cardiologist:  Candee Furbish, MD Electrophysiologist:  None   Chief Complaint:  Manage dyslipidemia  History of Present Illness:    Sean Hammond is a 79 y.o. male who presents via audio/video conferencing for a telehealth visit today.  This is a 79 year old male kindly referred for evaluation and management of dyslipidemia.  He had a recent hospitalization for chest pain/NSTEMI and was found to have subtotal occlusion of the proximal to mid RCA with weak left to right collaterals, then underwent PCI to the RCA.  The first obtuse marginal branch contained an 85% proximal stenosis.  He subsequently underwent staged PCI 4 days later of that vessel due to chronic kidney disease in the setting of prior unilateral nephrectomy.  LVEF was 50%.  He has a history of statin  intolerance having tried multiple statins losing rosuvastatin, atorvastatin, and pitavasatin.  Lipid profile from July 01, 2020 showed total cholesterol 158, triglycerides 49, HDL 44 and LDL 104, essentially unchanged from labs 5 years ago.  The patient does not have symptoms concerning for COVID-19 infection (fever, chills, cough, or new SHORTNESS OF BREATH).    Prior CV studies:   The following studies were reviewed today:  Chart review, lab work  PMHx:  Past Medical History:  Diagnosis Date  . Allergic rhinitis   . BPH (benign prostatic hyperplasia)    urologist-- dr t. [name]  . CAD (coronary artery disease) cardiologsit--- dr [name]   a. 12-14-2011 s/p PCI with DES x2 to midLCFx. b. NSTEMI 06/2020 s/p DES to RCA and staged DES to OM1.  . Chronic headaches    due to sinues  . ED (erectile dysfunction)   . GERD (gastroesophageal reflux disease)   . Hematuria   . History of acute pyelonephritis    08-11-2019  . History of echocardiogram    last one 03-22-2015   mild LVH,   ef 55-60%  . History of kidney stones   . History of melanoma excision    non-malignant excised from ear  . History of MI (myocardial infarction)    per pt remote hx yrs ago prior to 2013  . History of nonmelanoma skin cancer    per pt SCC and BCC from back, head, face  excision  . History of prostatitis   . Hydronephrosis, right   . Hyperlipidemia   . Moderate obstructive sleep apnea  not used cpap since 2014 bought bed that head would elevate   . Osteoarthritis of hand   . PONV (postoperative nausea and vomiting)    Pt may have been confused after surgery in the past  . Statin intolerance   . Wears glasses     Past Surgical History:  Procedure Laterality Date  . BONE CYST EXCISION  ~ 2004   left knee  . CARDIAC CATHETERIZATION N/A 05/09/2016   Procedure: Left Heart Cath and Coronary Angiography;  Surgeon: Jerline Pain, MD;  Location: [place] [facility];  Service: Cardiovascular;  Laterality:  N/A;  . CHOLECYSTECTOMY N/A 03/23/2015   Procedure: LAPAROSCOPIC CHOLECYSTECTOMY;  Surgeon: Ralene Ok, MD;  Location: [facility];  Service: General;  Laterality: N/A;  . CORONARY ANGIOPLASTY WITH STENT PLACEMENT  12/14/11   "2"  . CORONARY STENT INTERVENTION N/A 07/01/2020   Procedure: CORONARY STENT INTERVENTION;  Surgeon: Belva Crome, MD;  Location: [place] [facility];  Service: Cardiovascular;  Laterality: N/A;  . CORONARY STENT INTERVENTION N/A 07/05/2020   Procedure: CORONARY STENT INTERVENTION;  Surgeon: Belva Crome, MD;  Location: [place] [facility];  Service: Cardiovascular;  Laterality: N/A;  . CYSTOSCOPY/RETROGRADE/URETEROSCOPY Bilateral 09/05/2019   Procedure: CYSTOSCOPY/ BILATERAL RETROGRADE/ RIGHT  URETEROSCOPY with biopsy with laser ablation of tumor;  Surgeon: Alexis Frock, MD;  Location: [facility];  Service: Urology;  Laterality: Bilateral;  . ETHMOIDECTOMY Right 09/01/2019   Procedure: RIGHT REVISION ENDOSCOPIC ETHMOIDECTOMY;  Surgeon: Izora Gala, MD;  Location: [place];  Service: ENT;  Laterality: Right;  . FRONTAL SINUSOTOMY Bilateral 07-23-2003   dr Constance Holster   re-do  . HOLMIUM LASER APPLICATION Right 86/57/8469   Procedure: HOLMIUM LASER APPLICATION;  Surgeon: Alexis Frock, MD;  Location: [facility];  Service: Urology;  Laterality: Right;  . KNEE ARTHROSCOPY Left ` 2010  . LEFT HEART CATH AND CORONARY ANGIOGRAPHY N/A 07/01/2020   Procedure: LEFT HEART CATH AND CORONARY ANGIOGRAPHY;  Surgeon: Belva Crome, MD;  Location: [place] [facility];  Service: Cardiovascular;  Laterality: N/A;  . LEFT HEART CATHETERIZATION WITH CORONARY ANGIOGRAM N/A 12/14/2011   Procedure: LEFT HEART CATHETERIZATION WITH CORONARY ANGIOGRAM;  Surgeon: Candee Furbish, MD;  Location[facility];  Service: Cardiovascular;  Laterality: N/A;  . PERCUTANEOUS CORONARY STENT INTERVENTION (PCI-S)  12/14/2011   Procedure: PERCUTANEOUS CORONARY STENT  INTERVENTION (PCI-S);  Surgeon: Candee Furbish, MD;  Location: [facility] CATH LAB;  Service: Cardiovascular;;  . ROBOT ASSITED LAPAROSCOPIC NEPHROURETERECTOMY Right 11/28/2019   Procedure: XI ROBOT ASSITED LAPAROSCOPIC NEPHROURETERECTOMY;  Surgeon: Alexis Frock, MD;  [facility]: [facility];  Service: Urology;  Laterality: Right;  3 HRS  . SEPTOPLASTY WITH ETHMOIDECTOMY, AND MAXILLARY ANTROSTOMY  09-22-2002   dr Constance Holster   and sinusotomy  . TOTAL KNEE ARTHROPLASTY Left 05/09/2017   Procedure: LEFT TOTAL KNEE ARTHROPLASTY;  Surgeon: Latanya Maudlin, MD;  Location: [facility];  Service: Orthopedics;  Laterality: Left;    FAMHx:  Family History  Problem Relation Age of Onset  . Heart attack Mother   . Hypertension Mother     SOCHx:   reports that he quit smoking about 45 years ago. His smoking use included cigarettes. He has a 24.00 pack-year smoking history. He has never used smokeless tobacco. He reports previous alcohol use. He reports that he does not use drugs.  ALLERGIES:  Allergies  Allergen Reactions  . Beef-Derived Products Anaphylaxis, Hives, Shortness Of Breath and Rash    Because of a tick bite,  the patient now has "Alpha-gal allergy"  . Pork-Derived Products Anaphylaxis, Hives, Shortness Of Breath and Rash    Because of a tick bite, the patient now has "Alpha-gal allergy"  . Codeine Other (See Comments)    Surgeon advised against this  . Crestor [Rosuvastatin] Other (See Comments)    Muscle aches  . Dilaudid [Hydromorphone Hcl] Hives    All-over hives  . Hydrocodone Itching  . Lipitor [Atorvastatin] Other (See Comments)    Muscle aches   . Percocet [Oxycodone-Acetaminophen] Itching  . Pitavastatin Other (See Comments)    Leg pain    MEDS:  Current Meds  Medication Sig  . Ascorbic Acid (VITAMIN C PO) Take 1 tablet by mouth daily.   Marland Kitchen aspirin EC 81 MG tablet Take 1 tablet (81 mg total) by mouth daily.  . clopidogrel (PLAVIX) 75 MG tablet Take 1 tablet (75 mg total) by mouth daily.  .  diphenhydrAMINE (BENADRYL) 25 MG tablet Take 25 mg by mouth every 6 (six) hours as needed for allergies.   Marland Kitchen EPIPEN 2-PAK 0.3 MG/0.3ML SOAJ injection Inject 0.3 mg into the muscle once as needed (for an anaphylactic reaction).   . metoprolol succinate (TOPROL-XL) 25 MG 24 hr tablet TAKE 1 TABLET BY MOUTH EVERY DAY (Patient taking differently: Take 25 mg by mouth daily. )  . Multiple Vitamin (MULTIVITAMIN PO) Take 1 tablet by mouth daily.   . nitroGLYCERIN (NITROSTAT) 0.4 MG SL tablet PLACE 1 TABLET UNDER TONGUE EVERY 5 MINUTES AS NEEDED FOR CHEST PAIN MAX 3 TABS/15 MIN (Patient taking differently: Place 0.4 mg under the tongue every 5 (five) minutes as needed for chest pain. )  . pantoprazole (PROTONIX) 40 MG tablet TAKE 1 TABLET (40 MG TOTAL) BY MOUTH DAILY AFTER BREAKFAST.  . tamsulosin (FLOMAX) 0.4 MG CAPS capsule Take 1 capsule (0.4 mg total) by mouth daily.  . traMADol (ULTRAM) 50 MG tablet Take 50 mg by mouth every 6 (six) hours as needed (pain).   Marland Kitchen VITAMIN D PO Take 1 tablet by mouth daily.      ROS: Pertinent items noted in HPI and remainder of comprehensive ROS otherwise negative.  Labs/Other Tests and Data Reviewed:    Recent Labs: 06/30/2020: ALT 19; Magnesium 1.9; TSH 1.376 07/05/2020: Hemoglobin 12.9; Platelets 173 07/07/2020: BUN 22; Creatinine, Ser 1.72; Potassium 4.7; Sodium 137   Recent Lipid Panel Lab Results  Component Value Date/Time   CHOL 158 07/01/2020 03:01 AM   TRIG 49 07/01/2020 03:01 AM   HDL 44 07/01/2020 03:01 AM   CHOLHDL 3.6 07/01/2020 03:01 AM   LDLCALC 104 (H) 07/01/2020 03:01 AM    Wt Readings from Last 3 Encounters:  07/05/20 193 lb (87.5 kg)  06/30/20 198 lb 3.2 oz (89.9 kg)  06/17/20 198 lb (89.8 kg)     Exam:    Vital Signs:  There were no vitals taken for this visit.   Not performed due to telephone visit  ASSESSMENT & PLAN:    1. Mixed dyslipidemia, goal LDL less than 70 2. CAD status post recent PCI to the RCA and first OM branch  for NSTEMI 3. Statin intolerance-myalgias 4. CKD 3A right nephrectomy 5. OSA not on CPAP  Mr. Milnes has a mixed dyslipidemia but has been intolerant of 3 statins all causing similar side effects including significant myalgias and leg weakness.  LDL is currently just above 100 and I think he could reach target LDL less than 70 with ezetimibe.  This would follow current guidelines prior  to considering a PCSK9 inhibitor.  We will start ezetimibe 10 mg daily.  Repeat lipids in 3 months and follow-up with me virtually at that time.  Thanks for the kind referral.  COVID-19 Education: The signs and symptoms of COVID-19 were discussed with the patient and how to seek care for testing (follow up with PCP or arrange E-visit).  The importance of social distancing was discussed today.  Patient Risk:   After full review of this patients clinical status, I feel that they are at least moderate risk at this time.  Time:   Today, I have spent 25 minutes with the patient with telehealth technology discussing dyslipidemia, goal LDL, coronary disease, statin intolerance.     Medication Adjustments/Labs and Tests Ordered: Current medicines are reviewed at length with the patient today.  Concerns regarding medicines are outlined above.   Tests Ordered: No orders of the defined types were placed in this encounter.   Medication Changes: No orders of the defined types were placed in this encounter.   Disposition:  in 3 month(s)  Pixie Casino, MD, [facility], [place] Director of the Advanced Lipid Disorders &  Cardiovascular Risk Reduction Clinic Diplomate of the American Board of Clinical Lipidology Attending Cardiologist  Direct Dial: [phone]  Fax: [phone]  Website:  [email or web address]  Pixie Casino, MD  07/12/2020 8:15 AM

## 2020-07-13 ENCOUNTER — Ambulatory Visit (INDEPENDENT_AMBULATORY_CARE_PROVIDER_SITE_OTHER): Payer: Medicare Other | Admitting: Physician Assistant

## 2020-07-13 ENCOUNTER — Other Ambulatory Visit: Payer: Self-pay

## 2020-07-13 ENCOUNTER — Encounter: Payer: Self-pay | Admitting: Physician Assistant

## 2020-07-13 ENCOUNTER — Telehealth: Payer: Self-pay | Admitting: Pharmacist

## 2020-07-13 VITALS — BP 128/68 | HR 93 | Ht 73.0 in | Wt 196.0 lb

## 2020-07-13 DIAGNOSIS — E785 Hyperlipidemia, unspecified: Secondary | ICD-10-CM | POA: Diagnosis not present

## 2020-07-13 DIAGNOSIS — I2510 Atherosclerotic heart disease of native coronary artery without angina pectoris: Secondary | ICD-10-CM

## 2020-07-13 DIAGNOSIS — E7800 Pure hypercholesterolemia, unspecified: Secondary | ICD-10-CM

## 2020-07-13 DIAGNOSIS — I200 Unstable angina: Secondary | ICD-10-CM

## 2020-07-13 DIAGNOSIS — N183 Chronic kidney disease, stage 3 unspecified: Secondary | ICD-10-CM

## 2020-07-13 DIAGNOSIS — G4733 Obstructive sleep apnea (adult) (pediatric): Secondary | ICD-10-CM

## 2020-07-13 NOTE — Telephone Encounter (Signed)
Consulted by Sean Hammond at appt today to discuss alternative lipid lowering therapy. Pt seen by Dr [name] yesterday and started on Zetia. However, pt states he does not wish to take Zetia as he is concerned about his solitary kidney. Advised pt that Zetia will not affect his kidney function, however mentioned PCSK9i therapy as an option as well. Pt plans to discuss this with his wife and will call clinic if he wishes to start [place]. With progressive ASCVD (stents placed in 2013 as well as recent NSTEMI on 07/05/20 treated with DES), would recommend more aggressive LDL goal < 55mg /dL in accordance with AACE/ACE and ESC guidelines. His LDL was 104 during recent admission which may be falsely low due to NSTEMI; PCSK9i would bring his LDL to goal < 55 more effectively.  If pt does elect to start PCSK9i therapy, his Part D insurance was obtained from his pharmacy since he did not have his card with him and we don't have it scanned into Epic:  ID: [id number] BIN: [id number] PCN: [id number]

## 2020-07-13 NOTE — Patient Instructions (Signed)
Medication Instructions:  Your physician recommends that you continue on your current medications as directed. Please refer to the Current Medication list given to you today.  *If you need a refill on your cardiac medications before your next appointment, please call your pharmacy*   Lab Work: None If you have labs (blood work) drawn today and your tests are completely normal, you will receive your results only by: Marland Kitchen MyChart Message (if you have MyChart) OR . A paper copy in the mail If you have any lab test that is abnormal or we need to change your treatment, we will call you to review the results.   Testing/Procedures: None   Follow-Up: At [facility], you and your health needs are our priority.  As part of our continuing mission to provide you with exceptional heart care, we have created designated Provider Care Teams.  These Care Teams include your primary Cardiologist (physician) and Advanced Practice Providers (APPs -  Physician Assistants and Nurse Practitioners) who all work together to provide you with the care you need, when you need it.   Your next appointment:   09/20/2020  The format for your next appointment:   In Person  Provider:   Candee Furbish, MD   Other Instructions None

## 2020-07-14 ENCOUNTER — Ambulatory Visit: Payer: Medicare Other | Admitting: Nurse Practitioner

## 2020-07-14 NOTE — Telephone Encounter (Signed)
Called pt to follow up. He states he would like to call his nephrologist first to make sure they're ok with him taking Repatha. Advised pt that Repatha does not affect his kidney function and is safe for him to take. He wishes for [place] to wait to start the prior auth process until he discusses with them.

## 2020-07-22 ENCOUNTER — Telehealth: Payer: Self-pay | Admitting: Cardiology

## 2020-07-22 MED ORDER — PRALUENT 75 MG/ML ~~LOC~~ SOAJ: 1.0000 "pen " | SUBCUTANEOUS | 11 refills | Status: DC

## 2020-07-22 NOTE — Telephone Encounter (Signed)
Pt c/o medication issue:  1. Name of Medication: Repatha  2. How are you currently taking this medication (dosage and times per day)? n/a  3. Are you having a reaction (difficulty breathing--STAT)? no  4. What is your medication issue? Patient's insurance did not approve Repatha but has approved Praluent. Patient would like to leave a message for Ermalinda Barrios to make sure she is OK with this medication change. Please advise.

## 2020-07-22 NOTE — Addendum Note (Signed)
Addended by: Marcelle Overlie D on: 07/22/2020 01:54 PM   Modules accepted: Orders

## 2020-07-22 NOTE — Telephone Encounter (Signed)
Praluent approved through 07/22/2021. Rx sent to pharmacy. Cost is $95.73 Called patient and reviewed. Patient is ok with the cost. Will do labs at appointment with Dr. [name] in Nov

## 2020-07-22 NOTE — Telephone Encounter (Signed)
Patient called stating that he would like to proceed with Repatha. I will submit a PA for Repatha and will let patient know when its approved.  Addendum: Patient has [employer] as a PBM and they only cover Praluent. PA for praluent submitted.

## 2020-07-22 NOTE — Telephone Encounter (Signed)
I have already discussed this with patient. He is looking for [facility] approval. I will send to her

## 2020-07-23 ENCOUNTER — Telehealth (HOSPITAL_COMMUNITY): Payer: Self-pay

## 2020-07-23 NOTE — Telephone Encounter (Signed)
Please let patient know I agree with him taking Repatha. thanks

## 2020-07-23 NOTE — Telephone Encounter (Signed)
Faxed referral for Phase II Cardiac Rehab to [facility]. °

## 2020-09-13 ENCOUNTER — Other Ambulatory Visit: Payer: Self-pay | Admitting: Cardiology

## 2020-09-20 ENCOUNTER — Other Ambulatory Visit: Payer: Medicare Other

## 2020-09-20 ENCOUNTER — Ambulatory Visit (INDEPENDENT_AMBULATORY_CARE_PROVIDER_SITE_OTHER): Payer: Medicare Other | Admitting: Cardiology

## 2020-09-20 ENCOUNTER — Other Ambulatory Visit: Payer: Self-pay

## 2020-09-20 ENCOUNTER — Encounter: Payer: Self-pay | Admitting: Cardiology

## 2020-09-20 ENCOUNTER — Other Ambulatory Visit: Payer: Medicare Other | Admitting: *Deleted

## 2020-09-20 VITALS — BP 122/62 | HR 72 | Ht 73.0 in | Wt 192.4 lb

## 2020-09-20 DIAGNOSIS — E785 Hyperlipidemia, unspecified: Secondary | ICD-10-CM

## 2020-09-20 DIAGNOSIS — I200 Unstable angina: Secondary | ICD-10-CM | POA: Diagnosis not present

## 2020-09-20 DIAGNOSIS — E7800 Pure hypercholesterolemia, unspecified: Secondary | ICD-10-CM | POA: Diagnosis not present

## 2020-09-20 DIAGNOSIS — N1831 Chronic kidney disease, stage 3a: Secondary | ICD-10-CM

## 2020-09-20 DIAGNOSIS — I2510 Atherosclerotic heart disease of native coronary artery without angina pectoris: Secondary | ICD-10-CM | POA: Diagnosis not present

## 2020-09-20 DIAGNOSIS — I209 Angina pectoris, unspecified: Secondary | ICD-10-CM | POA: Diagnosis not present

## 2020-09-20 LAB — LIPID PANEL
Chol/HDL Ratio: 2.9 ratio (ref 0.0–5.0)
Cholesterol, Total: 121 mg/dL (ref 100–199)
HDL: 42 mg/dL (ref >39)
LDL Chol Calc (NIH): 62 mg/dL (ref 0–99)
Triglycerides: 89 mg/dL (ref 0–149)
VLDL Cholesterol Cal: 17 mg/dL (ref 5–40)

## 2020-09-20 LAB — HEPATIC FUNCTION PANEL
ALT: 12 IU/L (ref 0–44)
AST: 21 IU/L (ref 0–40)
Albumin: 4.3 g/dL (ref 3.7–4.7)
Alkaline Phosphatase: 72 IU/L (ref 44–121)
Bilirubin Total: 0.5 mg/dL (ref 0.0–1.2)
Bilirubin, Direct: 0.15 mg/dL (ref 0.00–0.40)
Total Protein: 6.6 g/dL (ref 6.0–8.5)

## 2020-09-20 NOTE — Patient Instructions (Addendum)
Medication Instructions:  Your physician recommends that you continue on your current medications as directed. Please refer to the Current Medication list given to you today.  *If you need a refill on your cardiac medications before your next appointment, please call your pharmacy*   Lab Work: None ordered  If you have labs (blood work) drawn today and your tests are completely normal, you will receive your results only by: . MyChart Message (if you have MyChart) OR . A paper copy in the mail If you have any lab test that is abnormal or we need to change your treatment, we will call you to review the results.   Testing/Procedures: None ordered   Follow-Up: At [facility], you and your health needs are our priority.  As part of our continuing mission to provide you with exceptional heart care, we have created designated Provider Care Teams.  These Care Teams include your primary Cardiologist (physician) and Advanced Practice Providers (APPs -  Physician Assistants and Nurse Practitioners) who all work together to provide you with the care you need, when you need it.  We recommend signing up for the patient portal called "MyChart".  Sign up information is provided on this After Visit Summary.  MyChart is used to connect with patients for Virtual Visits (Telemedicine).  Patients are able to view lab/test results, encounter notes, upcoming appointments, etc.  Non-urgent messages can be sent to your provider as well.   To learn more about what you can do with MyChart, go to [email or web address].    Your next appointment:   6 month(s)  The format for your next appointment:   In Person  Provider:   You may see Mark Skains, MD or one of the following Advanced Practice Providers on your designated Care Team:    Lori Gerhardt, NP  Laura Ingold, NP  Jill McDaniel, NP    Other Instructions   

## 2020-09-20 NOTE — Progress Notes (Signed)
Cardiology Office Note:    Date:  09/20/2020   ID:  Sean Hammond, DOB [birth year], MRN [id number]  PCP:  Josetta Huddle, MD  [facility] Cardiologist:  Candee Furbish, MD  [facility] Electrophysiologist:  None   Referring MD: Josetta Huddle, MD     History of Present Illness:    Sean Hammond is a 79 y.o. male here for the follow-up of coronary artery disease, hyperlipidemia with new start Praluent.  No longer on Zetia.  Not able to take statins secondary to intolerance.  Appreciate lipid clinic.  Notes reviewed.  Back on 07/01/2019 when he was admitted with unstable angina and ended up having cardiac catheterization with DES to the RCA and DES to the obtuse marginal.  Has a mid LAD lesion.  Creatinine 1.77  Overall been doing quite well.  Previously had talked about Zetia but afraid to take it because of single kidney.    Past Medical History:  Diagnosis Date  . Allergic rhinitis   . BPH (benign prostatic hyperplasia)    urologist-- dr t. [name]  . CAD (coronary artery disease) cardiologsit--- dr [name]   a. 12-14-2011 s/p PCI with DES x2 to midLCFx. b. NSTEMI 06/2020 s/p DES to RCA and staged DES to OM1.  . Chronic headaches    due to sinues  . ED (erectile dysfunction)   . GERD (gastroesophageal reflux disease)   . Hematuria   . History of acute pyelonephritis    08-11-2019  . History of echocardiogram    last one 03-22-2015   mild LVH,   ef 55-60%  . History of kidney stones   . History of melanoma excision    non-malignant excised from ear  . History of MI (myocardial infarction)    per pt remote hx yrs ago prior to 2013  . History of nonmelanoma skin cancer    per pt SCC and BCC from back, head, face  excision  . History of prostatitis   . Hydronephrosis, right   . Hyperlipidemia   . Moderate obstructive sleep apnea    not used cpap since 2014 bought bed that head would elevate   . Osteoarthritis of hand   . PONV (postoperative nausea and vomiting)    Pt may  have been confused after surgery in the past  . Statin intolerance   . Wears glasses     Past Surgical History:  Procedure Laterality Date  . BONE CYST EXCISION  ~ 2004   left knee  . CARDIAC CATHETERIZATION N/A 05/09/2016   Procedure: Left Heart Cath and Coronary Angiography;  Surgeon: Jerline Pain, MD;  Location: [place] [facility];  Service: Cardiovascular;  Laterality: N/A;  . CHOLECYSTECTOMY N/A 03/23/2015   Procedure: LAPAROSCOPIC CHOLECYSTECTOMY;  Surgeon: Ralene Ok, MD;  Location: [facility];  Service: General;  Laterality: N/A;  . CORONARY ANGIOPLASTY WITH STENT PLACEMENT  12/14/11   "2"  . CORONARY STENT INTERVENTION N/A 07/01/2020   Procedure: CORONARY STENT INTERVENTION;  Surgeon: Belva Crome, MD;  Location: [place] [facility];  Service: Cardiovascular;  Laterality: N/A;  . CORONARY STENT INTERVENTION N/A 07/05/2020   Procedure: CORONARY STENT INTERVENTION;  Surgeon: Belva Crome, MD;  Location: [place] [facility];  Service: Cardiovascular;  Laterality: N/A;  . CYSTOSCOPY/RETROGRADE/URETEROSCOPY Bilateral 09/05/2019   Procedure: CYSTOSCOPY/ BILATERAL RETROGRADE/ RIGHT  URETEROSCOPY with biopsy with laser ablation of tumor;  Surgeon: Alexis Frock, MD;  Location: [facility];  Service: Urology;  Laterality: Bilateral;  .  ETHMOIDECTOMY Right 09/01/2019   Procedure: RIGHT REVISION ENDOSCOPIC ETHMOIDECTOMY;  Surgeon: Izora Gala, MD;  Location: [place];  Service: ENT;  Laterality: Right;  . FRONTAL SINUSOTOMY Bilateral 07-23-2003   dr Constance Holster   re-do  . HOLMIUM LASER APPLICATION Right 67/20/9470   Procedure: HOLMIUM LASER APPLICATION;  Surgeon: Alexis Frock, MD;  Location: [facility];  Service: Urology;  Laterality: Right;  . KNEE ARTHROSCOPY Left ` 2010  . LEFT HEART CATH AND CORONARY ANGIOGRAPHY N/A 07/01/2020   Procedure: LEFT HEART CATH AND CORONARY ANGIOGRAPHY;  Surgeon: Belva Crome, MD;  Location: [place] [facility];   Service: Cardiovascular;  Laterality: N/A;  . LEFT HEART CATHETERIZATION WITH CORONARY ANGIOGRAM N/A 12/14/2011   Procedure: LEFT HEART CATHETERIZATION WITH CORONARY ANGIOGRAM;  Surgeon: Candee Furbish, MD;  Location: [facility];  Service: Cardiovascular;  Laterality: N/A;  . PERCUTANEOUS CORONARY STENT INTERVENTION (PCI-S)  12/14/2011   Procedure: PERCUTANEOUS CORONARY STENT INTERVENTION (PCI-S);  Surgeon: Candee Furbish, MD;  Location: [facility];  Service: Cardiovascular;;  . ROBOT ASSITED LAPAROSCOPIC NEPHROURETERECTOMY Right 11/28/2019   Procedure: XI ROBOT ASSITED LAPAROSCOPIC NEPHROURETERECTOMY;  Surgeon: Alexis Frock, MD;  Location: [facility];  Service: Urology;  Laterality: Right;  3 HRS  . SEPTOPLASTY WITH ETHMOIDECTOMY, AND MAXILLARY ANTROSTOMY  09-22-2002   dr Constance Holster   and sinusotomy  . TOTAL KNEE ARTHROPLASTY Left 05/09/2017   Procedure: LEFT TOTAL KNEE ARTHROPLASTY;  Surgeon: Latanya Maudlin, MD;  Location: [facility];  Service: Orthopedics;  Laterality: Left;    Current Medications: Current Meds  Medication Sig  . Alirocumab (PRALUENT) 75 MG/ML SOAJ Inject 1 pen into the skin every 14 (fourteen) days.  . Ascorbic Acid (VITAMIN C PO) Take 1 tablet by mouth daily.   Marland Kitchen aspirin EC 81 MG tablet Take 1 tablet (81 mg total) by mouth daily.  . clopidogrel (PLAVIX) 75 MG tablet Take 1 tablet (75 mg total) by mouth daily.  . diphenhydrAMINE (BENADRYL) 25 MG tablet Take 25 mg by mouth every 6 (six) hours as needed for allergies.   Marland Kitchen EPIPEN 2-PAK 0.3 MG/0.3ML SOAJ injection Inject 0.3 mg into the muscle once as needed (for an anaphylactic reaction).   . metoprolol succinate (TOPROL-XL) 25 MG 24 hr tablet TAKE 1 TABLET BY MOUTH EVERY DAY  . Multiple Vitamin (MULTIVITAMIN PO) Take 1 tablet by mouth daily.   . nitroGLYCERIN (NITROSTAT) 0.4 MG SL tablet PLACE 1 TABLET UNDER TONGUE EVERY 5 MINUTES AS NEEDED FOR CHEST PAIN MAX 3 TABS/15 MIN  . pantoprazole (PROTONIX) 40 MG tablet TAKE 1 TABLET (40 MG TOTAL)  BY MOUTH DAILY AFTER BREAKFAST.  . tamsulosin (FLOMAX) 0.4 MG CAPS capsule Take 1 capsule (0.4 mg total) by mouth daily.  . traMADol (ULTRAM) 50 MG tablet Take 50 mg by mouth every 6 (six) hours as needed (pain).   Marland Kitchen VITAMIN D PO Take 1 tablet by mouth daily.      Allergies:   Beef-derived products, Pork-derived products, Codeine, Crestor [rosuvastatin], Dilaudid [hydromorphone hcl], Hydrocodone, Lipitor [atorvastatin], Percocet [oxycodone-acetaminophen], and Pitavastatin   Social History   Socioeconomic History  . Marital status: Married    Spouse name: Not on file  . Number of children: Not on file  . Years of education: Not on file  . Highest education level: Not on file  Occupational History  . Not on file  Tobacco Use  . Smoking status: Former Smoker    Packs/day: 2.00    Years: 12.00    Pack  years: 24.00    Types: Cigarettes    Quit date: 09/03/1974    Years since quitting: 46.0  . Smokeless tobacco: Never Used  Vaping Use  . Vaping Use: Never used  Substance and Sexual Activity  . Alcohol use: Not Currently  . Drug use: Never  . Sexual activity: Not on file  Other Topics Concern  . Not on file  Social History Narrative  . Not on file   Social Determinants of Health   Financial Resource Strain:   . Difficulty of Paying Living Expenses: Not on file  Food Insecurity:   . Worried About Charity fundraiser in the Last Year: Not on file  . Ran Out of Food in the Last Year: Not on file  Transportation Needs:   . Lack of Transportation (Medical): Not on file  . Lack of Transportation (Non-Medical): Not on file  Physical Activity:   . Days of Exercise per Week: Not on file  . Minutes of Exercise per Session: Not on file  Stress:   . Feeling of Stress : Not on file  Social Connections:   . Frequency of Communication with Friends and Family: Not on file  . Frequency of Social Gatherings with Friends and Family: Not on file  . Attends Religious Services: Not on file   . Active Member of Clubs or Organizations: Not on file  . Attends Archivist Meetings: Not on file  . Marital Status: Not on file     Family History: The patient's family history includes Heart attack in his mother; Hypertension in his mother.  ROS:   Please see the history of present illness.     All other systems reviewed and are negative.  EKGs/Labs/Other Studies Reviewed:    The following studies were reviewed today:  Cath 8/12.Marland KitchenMarland Kitchen8/16/2021  Subtotal occlusion with greater than 95% stenosis from proximal to mid RCA.  Week left-to-right collaterals are noted.  Successful stenting of the right coronary with reduction in stenosis to 0% and TIMI grade III flow using a 30 x 2.75 mm Onyx postdilated to 3.0 mm in diameter.  Left main is widely patent  LAD contains moderate to moderately severe mid atherosclerosis.  No high-grade obstruction is noted.  Circumflex contains mid vessel stents.  The stents started just below the first obtuse marginal.  The first marginal contains a proximal 85% stenosis.  The circumflex proximal to the origin of the first obtuse marginal contains eccentric 70 to 80% stenosis.  The obtuse marginal and circumflex will need percutaneous intervention.  Should be staged he electively performed after 72 hours post contrast exposure on this procedure.  Inferior wall hypokinesis.  EF is 50%.   First obtuse marginal 85% mid stenosis reduced to 0% with TIMI grade III flow using a 2.25 x 12 Onyx deployed at 12 atm.  RECOMMENDATIONS:  Aspirin and Plavix x12 months.  Then consider decreasing to Plavix monotherapy thereafter.    Recent Labs: 06/30/2020: ALT 19; Magnesium 1.9; TSH 1.376 07/05/2020: Hemoglobin 12.9; Platelets 173 07/07/2020: BUN 22; Creatinine, Ser 1.72; Potassium 4.7; Sodium 137  Recent Lipid Panel    Component Value Date/Time   CHOL 158 07/01/2020 0301   TRIG 49 07/01/2020 0301   HDL 44 07/01/2020 0301   CHOLHDL 3.6 07/01/2020  0301   VLDL 10 07/01/2020 0301   LDLCALC 104 (H) 07/01/2020 0301     Risk Assessment/Calculations:       Physical Exam:    VS:  BP 122/62  Pulse 72   Ht _0  (1.854 m)   Wt 192 lb 6.4 oz (87.3 kg)   SpO2 96%   BMI 25.38 kg/m     Wt Readings from Last 3 Encounters:  09/20/20 192 lb 6.4 oz (87.3 kg)  07/13/20 196 lb (88.9 kg)  07/05/20 193 lb (87.5 kg)     GEN:  Well nourished, well developed in no acute distress HEENT: Normal NECK: No JVD; No carotid bruits LYMPHATICS: No lymphadenopathy CARDIAC: RRR, no murmurs, rubs, gallops RESPIRATORY:  Clear to auscultation without rales, wheezing or rhonchi  ABDOMEN: Soft, non-tender, non-distended MUSCULOSKELETAL:  No edema; No deformity  SKIN: Warm and dry NEUROLOGIC:  Alert and oriented x 3 PSYCHIATRIC:  Normal affect   ASSESSMENT:    1. Coronary artery disease involving native coronary artery of native heart without angina pectoris   2. Stage 3a chronic kidney disease ([place])   3. Hyperlipidemia, unspecified hyperlipidemia type   4. Angina pectoris ([place])    PLAN:    In order of problems listed above:  Coronary artery disease/angina -Prior circumflex stent 2013 with subsequent RCA stent and obtuse marginal stent in 2021 07/05/2020 LAD stent.  Continue with dual antiplatelet therapy for 1 year. Minimal SOB, much better.  No significant anginal symptoms.  Occasional twinge of chest discomfort.  Reassurance.  On aspirin and Plavix.  We will continue dual antiplatelet therapy until August 2022.  Continue with metoprolol 25 mg to help control angina.  Hyperlipidemia -Statin intolerance.  Now on Praluent.  Tolerating this well.  Insurance would not pay for Repatha.  We will check his lipids today.  Last LDL was 104.  Goal is less than 70.  CKD stage III -Nephrectomy in January 2021.  Baseline creatinine about 1.8.  Continue to monitor.  Avoid NSAIDs.   Medication Adjustments/Labs and Tests Ordered: Current medicines are  reviewed at length with the patient today.  Concerns regarding medicines are outlined above.  No orders of the defined types were placed in this encounter.  No orders of the defined types were placed in this encounter.   Patient Instructions  Medication Instructions:  Your physician recommends that you continue on your current medications as directed. Please refer to the Current Medication list given to you today.  *If you need a refill on your cardiac medications before your next appointment, please call your pharmacy*   Lab Work: None ordered  If you have labs (blood work) drawn today and your tests are completely normal, you will receive your results only by: Marland Kitchen MyChart Message (if you have MyChart) OR . A paper copy in the mail If you have any lab test that is abnormal or we need to change your treatment, we will call you to review the results.   Testing/Procedures: None ordered   Follow-Up: At [facility], you and your health needs are our priority.  As part of our continuing mission to provide you with exceptional heart care, we have created designated Provider Care Teams.  These Care Teams include your primary Cardiologist (physician) and Advanced Practice Providers (APPs -  Physician Assistants and Nurse Practitioners) who all work together to provide you with the care you need, when you need it.  We recommend signing up for the patient portal called "MyChart".  Sign up information is provided on this After Visit Summary.  MyChart is used to connect with patients for Virtual Visits (Telemedicine).  Patients are able to view lab/test results, encounter notes, upcoming appointments, etc.  Non-urgent  messages can be sent to your provider as well.   To learn more about what you can do with MyChart, go to [email or web address].    Your next appointment:   6 month(s)  The format for your next appointment:   In Person  Provider:   You may see Candee Furbish, MD or one of the  following Advanced Practice Providers on your designated Care Team:    Truitt Merle, NP  Cecilie Kicks, NP  Kathyrn Drown, NP    Other Instructions      Signed, Candee Furbish, MD  09/20/2020 9:05 AM    [place]

## 2020-10-06 DIAGNOSIS — R519 Headache, unspecified: Secondary | ICD-10-CM | POA: Diagnosis not present

## 2020-10-06 DIAGNOSIS — Z20828 Contact with and (suspected) exposure to other viral communicable diseases: Secondary | ICD-10-CM | POA: Diagnosis not present

## 2020-10-06 DIAGNOSIS — J019 Acute sinusitis, unspecified: Secondary | ICD-10-CM | POA: Diagnosis not present

## 2020-10-22 DIAGNOSIS — J0100 Acute maxillary sinusitis, unspecified: Secondary | ICD-10-CM | POA: Diagnosis not present

## 2020-10-22 DIAGNOSIS — J209 Acute bronchitis, unspecified: Secondary | ICD-10-CM | POA: Diagnosis not present

## 2020-10-22 DIAGNOSIS — Z20828 Contact with and (suspected) exposure to other viral communicable diseases: Secondary | ICD-10-CM | POA: Diagnosis not present

## 2020-11-09 DIAGNOSIS — C641 Malignant neoplasm of right kidney, except renal pelvis: Secondary | ICD-10-CM | POA: Diagnosis not present

## 2020-11-09 DIAGNOSIS — Z23 Encounter for immunization: Secondary | ICD-10-CM | POA: Diagnosis not present

## 2020-11-09 DIAGNOSIS — I2510 Atherosclerotic heart disease of native coronary artery without angina pectoris: Secondary | ICD-10-CM | POA: Diagnosis not present

## 2020-11-09 DIAGNOSIS — M170 Bilateral primary osteoarthritis of knee: Secondary | ICD-10-CM | POA: Diagnosis not present

## 2020-11-09 DIAGNOSIS — G4733 Obstructive sleep apnea (adult) (pediatric): Secondary | ICD-10-CM | POA: Diagnosis not present

## 2020-11-09 DIAGNOSIS — E559 Vitamin D deficiency, unspecified: Secondary | ICD-10-CM | POA: Diagnosis not present

## 2020-11-09 DIAGNOSIS — Z79899 Other long term (current) drug therapy: Secondary | ICD-10-CM | POA: Diagnosis not present

## 2020-11-09 DIAGNOSIS — I700 Atherosclerosis of aorta: Secondary | ICD-10-CM | POA: Diagnosis not present

## 2020-11-09 DIAGNOSIS — G609 Hereditary and idiopathic neuropathy, unspecified: Secondary | ICD-10-CM | POA: Diagnosis not present

## 2020-11-09 DIAGNOSIS — E782 Mixed hyperlipidemia: Secondary | ICD-10-CM | POA: Diagnosis not present

## 2020-11-09 DIAGNOSIS — Z0001 Encounter for general adult medical examination with abnormal findings: Secondary | ICD-10-CM | POA: Diagnosis not present

## 2020-11-09 DIAGNOSIS — M109 Gout, unspecified: Secondary | ICD-10-CM | POA: Diagnosis not present

## 2020-12-13 DIAGNOSIS — I2510 Atherosclerotic heart disease of native coronary artery without angina pectoris: Secondary | ICD-10-CM | POA: Diagnosis not present

## 2020-12-13 DIAGNOSIS — G609 Hereditary and idiopathic neuropathy, unspecified: Secondary | ICD-10-CM | POA: Diagnosis not present

## 2020-12-13 DIAGNOSIS — Z789 Other specified health status: Secondary | ICD-10-CM | POA: Diagnosis not present

## 2020-12-13 DIAGNOSIS — I10 Essential (primary) hypertension: Secondary | ICD-10-CM | POA: Diagnosis not present

## 2020-12-22 DIAGNOSIS — C651 Malignant neoplasm of right renal pelvis: Secondary | ICD-10-CM | POA: Diagnosis not present

## 2020-12-22 DIAGNOSIS — R339 Retention of urine, unspecified: Secondary | ICD-10-CM | POA: Diagnosis not present

## 2021-01-03 ENCOUNTER — Ambulatory Visit (INDEPENDENT_AMBULATORY_CARE_PROVIDER_SITE_OTHER): Payer: Medicare Other | Admitting: Cardiology

## 2021-01-03 ENCOUNTER — Encounter: Payer: Self-pay | Admitting: Cardiology

## 2021-01-03 ENCOUNTER — Other Ambulatory Visit: Payer: Self-pay

## 2021-01-03 VITALS — BP 140/60 | HR 67 | Ht 73.0 in | Wt 198.0 lb

## 2021-01-03 DIAGNOSIS — Z789 Other specified health status: Secondary | ICD-10-CM

## 2021-01-03 DIAGNOSIS — I2510 Atherosclerotic heart disease of native coronary artery without angina pectoris: Secondary | ICD-10-CM | POA: Diagnosis not present

## 2021-01-03 DIAGNOSIS — N1831 Chronic kidney disease, stage 3a: Secondary | ICD-10-CM | POA: Diagnosis not present

## 2021-01-03 DIAGNOSIS — E785 Hyperlipidemia, unspecified: Secondary | ICD-10-CM

## 2021-01-03 DIAGNOSIS — I209 Angina pectoris, unspecified: Secondary | ICD-10-CM | POA: Diagnosis not present

## 2021-01-03 NOTE — Patient Instructions (Signed)

## 2021-01-03 NOTE — Progress Notes (Signed)
Cardiology Office Note:    Date:  01/03/2021   ID:  Sean Hammond, DOB 11-06-1941, MRN [id number]  PCP:  Josetta Huddle, MD   [place]  Cardiologist:  Candee Furbish, MD  Advanced Practice Provider:  No care team member to display Electrophysiologist:  None       Referring MD: Josetta Huddle, MD     History of Present Illness:    Sean Hammond is a 80 y.o. male here for the follow-up of CAD, hyperlipidemia, statin intolerance, on Praluent.  Prior creatinine 1.77 with chronic kidney disease, single kidney.  In August 20 20 was admitted with unstable angina, cardiac catheterization resulted in DES to RCA as well as DES to obtuse marginal.  He also has a mid LAD lesion.  Overall been doing very well.  Occasional shortness of breath when bending over to tie shoes.  Doing rabbit hunting.  Praluent was very expensive at the beginning of the year.  Discussed deductible.  Past Medical History:  Diagnosis Date  . Allergic rhinitis   . BPH (benign prostatic hyperplasia)    urologist-- dr t. [name]  . CAD (coronary artery disease) cardiologsit--- dr [name]   a. 12-14-2011 s/p PCI with DES x2 to midLCFx. b. NSTEMI 06/2020 s/p DES to RCA and staged DES to OM1.  . Chronic headaches    due to sinues  . ED (erectile dysfunction)   . GERD (gastroesophageal reflux disease)   . Hematuria   . History of acute pyelonephritis    08-11-2019  . History of echocardiogram    last one 03-22-2015   mild LVH,   ef 55-60%  . History of kidney stones   . History of melanoma excision    non-malignant excised from ear  . History of MI (myocardial infarction)    per pt remote hx yrs ago prior to 2013  . History of nonmelanoma skin cancer    per pt SCC and BCC from back, head, face  excision  . History of prostatitis   . Hydronephrosis, right   . Hyperlipidemia   . Moderate obstructive sleep apnea    not used cpap since 2014 bought bed that head would elevate   . Osteoarthritis of  hand   . PONV (postoperative nausea and vomiting)    Pt may have been confused after surgery in the past  . Statin intolerance   . Wears glasses     Past Surgical History:  Procedure Laterality Date  . BONE CYST EXCISION  ~ 2004   left knee  . CARDIAC CATHETERIZATION N/A 05/09/2016   Procedure: Left Heart Cath and Coronary Angiography;  Surgeon: Jerline Pain, MD;  Location: [place] [facility];  Service: Cardiovascular;  Laterality: N/A;  . CHOLECYSTECTOMY N/A 03/23/2015   Procedure: LAPAROSCOPIC CHOLECYSTECTOMY;  Surgeon: Ralene Ok, MD;  Location: [facility];  Service: General;  Laterality: N/A;  . CORONARY ANGIOPLASTY WITH STENT PLACEMENT  12/14/11   "2"  . CORONARY STENT INTERVENTION N/A 07/01/2020   Procedure: CORONARY STENT INTERVENTION;  Surgeon: Belva Crome, MD;  Location: [place] [facility];  Service: Cardiovascular;  Laterality: N/A;  . CORONARY STENT INTERVENTION N/A 07/05/2020   Procedure: CORONARY STENT INTERVENTION;  Surgeon: Belva Crome, MD;  Location: [place] [facility];  Service: Cardiovascular;  Laterality: N/A;  . CYSTOSCOPY/RETROGRADE/URETEROSCOPY Bilateral 09/05/2019   Procedure: CYSTOSCOPY/ BILATERAL RETROGRADE/ RIGHT  URETEROSCOPY with biopsy with laser ablation of tumor;  Surgeon: Alexis Frock, MD;  Location: [facility];  Service: Urology;  Laterality: Bilateral;  . ETHMOIDECTOMY Right 09/01/2019   Procedure: RIGHT REVISION ENDOSCOPIC ETHMOIDECTOMY;  Surgeon: Izora Gala, MD;  Location: [facility];  Service: ENT;  Laterality: Right;  . FRONTAL SINUSOTOMY Bilateral 07-23-2003   dr Constance Holster   re-do  . HOLMIUM LASER APPLICATION Right 29/52/8413   Procedure: HOLMIUM LASER APPLICATION;  Surgeon: Alexis Frock, MD;  Location: [facility];  Service: Urology;  Laterality: Right;  . KNEE ARTHROSCOPY Left ` 2010  . LEFT HEART CATH AND CORONARY ANGIOGRAPHY N/A 07/01/2020   Procedure: LEFT HEART CATH AND CORONARY ANGIOGRAPHY;   Surgeon: Belva Crome, MD;  Location: [place] [facility];  Service: Cardiovascular;  Laterality: N/A;  . LEFT HEART CATHETERIZATION WITH CORONARY ANGIOGRAM N/A 12/14/2011   Procedure: LEFT HEART CATHETERIZATION WITH CORONARY ANGIOGRAM;  Surgeon: Candee Furbish, MD;  Location: [facility];  Service: Cardiovascular;  Laterality: N/A;  . PERCUTANEOUS CORONARY STENT INTERVENTION (PCI-S)  12/14/2011   Procedure: PERCUTANEOUS CORONARY STENT INTERVENTION (PCI-S);  Surgeon: Candee Furbish, MD;  Location: [facility];  Service: Cardiovascular;;  . ROBOT ASSITED LAPAROSCOPIC NEPHROURETERECTOMY Right 11/28/2019   Procedure: XI ROBOT ASSITED LAPAROSCOPIC NEPHROURETERECTOMY;  Surgeon: Alexis Frock, MD;  Location: [facility];  Service: Urology;  Laterality: Right;  3 HRS  . SEPTOPLASTY WITH ETHMOIDECTOMY, AND MAXILLARY ANTROSTOMY  09-22-2002   dr Constance Holster   and sinusotomy  . TOTAL KNEE ARTHROPLASTY Left 05/09/2017   Procedure: LEFT TOTAL KNEE ARTHROPLASTY;  Surgeon: Latanya Maudlin, MD;  Location: [facility];  Service: Orthopedics;  Laterality: Left;    Current Medications: Current Meds  Medication Sig  . Alirocumab (PRALUENT) 75 MG/ML SOAJ Inject 1 pen into the skin every 14 (fourteen) days.  . Ascorbic Acid (VITAMIN C PO) Take 1 tablet by mouth daily.   Marland Kitchen aspirin EC 81 MG tablet Take 1 tablet (81 mg total) by mouth daily.  . clopidogrel (PLAVIX) 75 MG tablet Take 1 tablet (75 mg total) by mouth daily.  . diphenhydrAMINE (BENADRYL) 25 MG tablet Take 25 mg by mouth every 6 (six) hours as needed for allergies.   Marland Kitchen EPIPEN 2-PAK 0.3 MG/0.3ML SOAJ injection Inject 0.3 mg into the muscle once as needed (for an anaphylactic reaction).   . metoprolol succinate (TOPROL-XL) 25 MG 24 hr tablet TAKE 1 TABLET BY MOUTH EVERY DAY  . Multiple Vitamin (MULTIVITAMIN PO) Take 1 tablet by mouth daily.   . nitroGLYCERIN (NITROSTAT) 0.4 MG SL tablet PLACE 1 TABLET UNDER TONGUE EVERY 5 MINUTES AS NEEDED FOR CHEST PAIN MAX 3 TABS/15 MIN  .  pantoprazole (PROTONIX) 40 MG tablet TAKE 1 TABLET (40 MG TOTAL) BY MOUTH DAILY AFTER BREAKFAST.  . tamsulosin (FLOMAX) 0.4 MG CAPS capsule Take 1 capsule (0.4 mg total) by mouth daily.  . traMADol (ULTRAM) 50 MG tablet Take 50 mg by mouth every 6 (six) hours as needed (pain).   . valsartan (DIOVAN) 160 MG tablet Take 160 mg by mouth daily.  Marland Kitchen VITAMIN D PO Take 1 tablet by mouth daily.      Allergies:   Beef-derived products, Pork-derived products, Codeine, Crestor [rosuvastatin], Dilaudid [hydromorphone hcl], Hydrocodone, Lipitor [atorvastatin], Percocet [oxycodone-acetaminophen], and Pitavastatin   Social History   Socioeconomic History  . Marital status: Married    Spouse name: Not on file  . Number of children: Not on file  . Years of education: Not on file  . Highest education level: Not on file  Occupational History  . Not on  file  Tobacco Use  . Smoking status: Former Smoker    Packs/day: 2.00    Years: 12.00    Pack years: 24.00    Types: Cigarettes    Quit date: 09/03/1974    Years since quitting: 46.3  . Smokeless tobacco: Never Used  Vaping Use  . Vaping Use: Never used  Substance and Sexual Activity  . Alcohol use: Not Currently  . Drug use: Never  . Sexual activity: Not on file  Other Topics Concern  . Not on file  Social History Narrative  . Not on file   Social Determinants of Health   Financial Resource Strain: Not on file  Food Insecurity: Not on file  Transportation Needs: Not on file  Physical Activity: Not on file  Stress: Not on file  Social Connections: Not on file     Family History: The patient's family history includes Heart attack in his mother; Hypertension in his mother.  ROS:   Please see the history of present illness.     All other systems reviewed and are negative.  EKGs/Labs/Other Studies Reviewed:    The following studies were reviewed today:  Cath 06/2020: Intervention     Recent Labs: 06/30/2020: Magnesium 1.9; TSH  1.376 07/05/2020: Hemoglobin 12.9; Platelets 173 07/07/2020: BUN 22; Creatinine, Ser 1.72; Potassium 4.7; Sodium 137 09/20/2020: ALT 12  Recent Lipid Panel    Component Value Date/Time   CHOL 121 09/20/2020 0904   TRIG 89 09/20/2020 0904   HDL 42 09/20/2020 0904   CHOLHDL 2.9 09/20/2020 0904   CHOLHDL 3.6 07/01/2020 0301   VLDL 10 07/01/2020 0301   LDLCALC 62 09/20/2020 0904     Risk Assessment/Calculations:      Physical Exam:    VS:  BP 140/60 (BP Location: Left Arm, Patient Position: Sitting, Cuff Size: Normal)   Pulse 67   Ht 6\' 1"  (1.854 m)   Wt 198 lb (89.8 kg)   SpO2 97%   BMI 26.12 kg/m     Wt Readings from Last 3 Encounters:  01/03/21 198 lb (89.8 kg)  09/20/20 192 lb 6.4 oz (87.3 kg)  07/13/20 196 lb (88.9 kg)     GEN:  Well nourished, well developed in no acute distress HEENT: Normal NECK: No JVD; No carotid bruits LYMPHATICS: No lymphadenopathy CARDIAC: RRR, no murmurs, rubs, gallops RESPIRATORY:  Clear to auscultation without rales, wheezing or rhonchi  ABDOMEN: Soft, non-tender, non-distended MUSCULOSKELETAL:  No edema; No deformity  SKIN: Warm and dry NEUROLOGIC:  Alert and oriented x 3 PSYCHIATRIC:  Normal affect   ASSESSMENT:    1. Coronary artery disease involving native coronary artery of native heart without angina pectoris   2. Stage 3a chronic kidney disease ([place])   3. Hyperlipidemia, unspecified hyperlipidemia type   4. Angina pectoris ([place])   5. Statin intolerance    PLAN:    In order of problems listed above:  Coronary artery disease -Circumflex stent 2013 -RCA and OM stent 07/05/2020 -LAD stent as well. -Plan is to continue dual antiplatelet therapy for a year.  He is on both aspirin and Plavix. -Metoprolol 25 to help control angina. -Occasional shortness of breath especially when bending over and tying shoes.  We discussed.   Hyperlipidemia with statin intolerance -Now on PCSK9 inhibitors Praluent.  Insurance did not cover  [place]. -Goal LDL less than 70 -Appreciate help from Dr. [name] and lipid clinic.  CKD stage IIIa -Prior nephrectomy in January 2021.  Baseline creatinine is about 1.8.  Avoid NSAIDs.   32-month follow-up    Medication Adjustments/Labs and Tests Ordered: Current medicines are reviewed at length with the patient today.  Concerns regarding medicines are outlined above.  No orders of the defined types were placed in this encounter.  No orders of the defined types were placed in this encounter.   Patient Instructions  Medication Instructions:  The current medical regimen is effective;  continue present plan and medications.  *If you need a refill on your cardiac medications before your next appointment, please call your pharmacy*  Follow-Up: At [facility], you and your health needs are our priority.  As part of our continuing mission to provide you with exceptional heart care, we have created designated Provider Care Teams.  These Care Teams include your primary Cardiologist (physician) and Advanced Practice Providers (APPs -  Physician Assistants and Nurse Practitioners) who all work together to provide you with the care you need, when you need it.  We recommend signing up for the patient portal called "MyChart".  Sign up information is provided on this After Visit Summary.  MyChart is used to connect with patients for Virtual Visits (Telemedicine).  Patients are able to view lab/test results, encounter notes, upcoming appointments, etc.  Non-urgent messages can be sent to your provider as well.   To learn more about what you can do with MyChart, go to [email or web address].    Your next appointment:   6 month(s)  The format for your next appointment:   In Person  Provider:   Candee Furbish, MD   Thank you for choosing [facility]!!        Signed, Candee Furbish, MD  01/03/2021 8:26 AM    [place]

## 2021-01-11 DIAGNOSIS — L814 Other melanin hyperpigmentation: Secondary | ICD-10-CM | POA: Diagnosis not present

## 2021-01-11 DIAGNOSIS — L570 Actinic keratosis: Secondary | ICD-10-CM | POA: Diagnosis not present

## 2021-01-11 DIAGNOSIS — L821 Other seborrheic keratosis: Secondary | ICD-10-CM | POA: Diagnosis not present

## 2021-01-21 ENCOUNTER — Other Ambulatory Visit: Payer: Self-pay | Admitting: Allergy and Immunology

## 2021-01-21 DIAGNOSIS — M5459 Other low back pain: Secondary | ICD-10-CM | POA: Diagnosis not present

## 2021-01-21 NOTE — Telephone Encounter (Signed)
Dr. Neldon Mc, prescription says it was discontinued on 07/05/2020 and refill might not be appropriate.  I cannot figure out why.  Please advise.

## 2021-01-24 NOTE — Telephone Encounter (Signed)
We can refill this nasal ipratropium if the [place] desires to do so.

## 2021-03-02 DIAGNOSIS — R1032 Left lower quadrant pain: Secondary | ICD-10-CM | POA: Diagnosis not present

## 2021-03-02 DIAGNOSIS — K5900 Constipation, unspecified: Secondary | ICD-10-CM | POA: Diagnosis not present

## 2021-03-02 DIAGNOSIS — K219 Gastro-esophageal reflux disease without esophagitis: Secondary | ICD-10-CM | POA: Diagnosis not present

## 2021-03-08 ENCOUNTER — Other Ambulatory Visit: Payer: Self-pay | Admitting: Cardiology

## 2021-04-01 ENCOUNTER — Other Ambulatory Visit: Payer: Self-pay

## 2021-04-01 MED ORDER — CLOPIDOGREL BISULFATE 75 MG PO TABS: 75.0000 mg | ORAL_TABLET | Freq: Every day | ORAL | 3 refills | Status: DC

## 2021-04-14 DIAGNOSIS — R1032 Left lower quadrant pain: Secondary | ICD-10-CM | POA: Diagnosis not present

## 2021-04-14 DIAGNOSIS — K5900 Constipation, unspecified: Secondary | ICD-10-CM | POA: Diagnosis not present

## 2021-04-14 DIAGNOSIS — K219 Gastro-esophageal reflux disease without esophagitis: Secondary | ICD-10-CM | POA: Diagnosis not present

## 2021-04-27 DIAGNOSIS — J0100 Acute maxillary sinusitis, unspecified: Secondary | ICD-10-CM | POA: Diagnosis not present

## 2021-05-09 DIAGNOSIS — R599 Enlarged lymph nodes, unspecified: Secondary | ICD-10-CM | POA: Diagnosis not present

## 2021-05-25 DIAGNOSIS — Z6824 Body mass index (BMI) 24.0-24.9, adult: Secondary | ICD-10-CM | POA: Diagnosis not present

## 2021-05-25 DIAGNOSIS — H6982 Other specified disorders of Eustachian tube, left ear: Secondary | ICD-10-CM | POA: Diagnosis not present

## 2021-05-25 DIAGNOSIS — H81319 Aural vertigo, unspecified ear: Secondary | ICD-10-CM | POA: Diagnosis not present

## 2021-06-14 DIAGNOSIS — C651 Malignant neoplasm of right renal pelvis: Secondary | ICD-10-CM | POA: Diagnosis not present

## 2021-06-16 DIAGNOSIS — K5730 Diverticulosis of large intestine without perforation or abscess without bleeding: Secondary | ICD-10-CM | POA: Diagnosis not present

## 2021-06-16 DIAGNOSIS — N281 Cyst of kidney, acquired: Secondary | ICD-10-CM | POA: Diagnosis not present

## 2021-06-16 DIAGNOSIS — C651 Malignant neoplasm of right renal pelvis: Secondary | ICD-10-CM | POA: Diagnosis not present

## 2021-06-16 DIAGNOSIS — N3289 Other specified disorders of bladder: Secondary | ICD-10-CM | POA: Diagnosis not present

## 2021-06-16 DIAGNOSIS — K753 Granulomatous hepatitis, not elsewhere classified: Secondary | ICD-10-CM | POA: Diagnosis not present

## 2021-06-18 ENCOUNTER — Other Ambulatory Visit: Payer: Self-pay | Admitting: Cardiology

## 2021-06-18 DIAGNOSIS — I2510 Atherosclerotic heart disease of native coronary artery without angina pectoris: Secondary | ICD-10-CM

## 2021-06-21 DIAGNOSIS — C651 Malignant neoplasm of right renal pelvis: Secondary | ICD-10-CM | POA: Diagnosis not present

## 2021-06-21 DIAGNOSIS — R339 Retention of urine, unspecified: Secondary | ICD-10-CM | POA: Diagnosis not present

## 2021-06-21 DIAGNOSIS — Z905 Acquired absence of kidney: Secondary | ICD-10-CM | POA: Diagnosis not present

## 2021-06-22 DIAGNOSIS — M545 Low back pain, unspecified: Secondary | ICD-10-CM | POA: Diagnosis not present

## 2021-06-22 DIAGNOSIS — H81319 Aural vertigo, unspecified ear: Secondary | ICD-10-CM | POA: Diagnosis not present

## 2021-06-22 DIAGNOSIS — Z6824 Body mass index (BMI) 24.0-24.9, adult: Secondary | ICD-10-CM | POA: Diagnosis not present

## 2021-07-19 DIAGNOSIS — L570 Actinic keratosis: Secondary | ICD-10-CM | POA: Diagnosis not present

## 2021-07-19 DIAGNOSIS — D045 Carcinoma in situ of skin of trunk: Secondary | ICD-10-CM | POA: Diagnosis not present

## 2021-07-28 DIAGNOSIS — R519 Headache, unspecified: Secondary | ICD-10-CM | POA: Diagnosis not present

## 2021-07-28 DIAGNOSIS — G8929 Other chronic pain: Secondary | ICD-10-CM | POA: Diagnosis not present

## 2021-09-02 ENCOUNTER — Ambulatory Visit: Payer: Medicare Other | Admitting: Cardiology

## 2021-09-06 ENCOUNTER — Encounter: Payer: Self-pay | Admitting: Cardiology

## 2021-09-06 ENCOUNTER — Ambulatory Visit (INDEPENDENT_AMBULATORY_CARE_PROVIDER_SITE_OTHER): Payer: Medicare Other | Admitting: Cardiology

## 2021-09-06 ENCOUNTER — Other Ambulatory Visit: Payer: Self-pay

## 2021-09-06 VITALS — BP 140/84 | HR 69 | Ht 73.0 in | Wt 197.0 lb

## 2021-09-06 DIAGNOSIS — N1831 Chronic kidney disease, stage 3a: Secondary | ICD-10-CM | POA: Diagnosis not present

## 2021-09-06 DIAGNOSIS — I209 Angina pectoris, unspecified: Secondary | ICD-10-CM

## 2021-09-06 DIAGNOSIS — E785 Hyperlipidemia, unspecified: Secondary | ICD-10-CM | POA: Diagnosis not present

## 2021-09-06 DIAGNOSIS — I2510 Atherosclerotic heart disease of native coronary artery without angina pectoris: Secondary | ICD-10-CM

## 2021-09-06 NOTE — Assessment & Plan Note (Signed)
-  Circumflex stent 2013 -RCA and OM stent 07/05/2020 -LAD stent as well. -Occasional shortness of breath especially when bending over and tying shoes.  We discussed.  -I think it would be fine for [place] to discontinue the aspirin 81 mg and continue the Plavix 75 at this point.  This will also minimize some of the bruising. - Continue with Toprol-XL 25 mg once a day for antianginal.

## 2021-09-06 NOTE — Patient Instructions (Signed)
Medication Instructions:   STOP TAKING ASPIRIN NOW BUT PLEASE CONTINUE ALL YOUR OTHER MEDICATIONS AND PLAVIX DAILY  *If you need a refill on your cardiac medications before your next appointment, please call your pharmacy*   Follow-Up: At [facility], you and your health needs are our priority.  As part of our continuing mission to provide you with exceptional heart care, we have created designated Provider Care Teams.  These Care Teams include your primary Cardiologist (physician) and Advanced Practice Providers (APPs -  Physician Assistants and Nurse Practitioners) who all work together to provide you with the care you need, when you need it.  We recommend signing up for the patient portal called "MyChart".  Sign up information is provided on this After Visit Summary.  MyChart is used to connect with patients for Virtual Visits (Telemedicine).  Patients are able to view lab/test results, encounter notes, upcoming appointments, etc.  Non-urgent messages can be sent to your provider as well.   To learn more about what you can do with MyChart, go to [email or web address].    Your next appointment:   1 year(s)  The format for your next appointment:   In Person  Provider:   Candee Furbish, MD

## 2021-09-06 NOTE — Assessment & Plan Note (Signed)
On Praluent.  Doing well.  Insurance did not cover [place] previously.  He is at goal LDL of less than 70, currently 50.  Excellent.  Appreciate Dr. Lysbeth Penner help.  No side effects.

## 2021-09-06 NOTE — Assessment & Plan Note (Signed)
Creatinine ranges from 1.8-1.5.  Prior nephrectomy in January 2021.  Avoiding NSAIDs.

## 2021-09-06 NOTE — Progress Notes (Signed)
Cardiology Office Note:    Date:  09/06/2021   ID:  Sean Hammond, DOB Mar 25, 1941, MRN [id number]  PCP:  Josetta Huddle, MD   [facility] Providers Cardiologist:  Candee Furbish, MD     Referring MD: Josetta Huddle, MD    History of Present Illness:    Sean Hammond is a 80 y.o. male here for follow up of CAD.  Has hyperlipidemia, statin intolerance, on Praluent. Praluent was very expensive at the beginning of the year.  Discussed deductible.  Prior creatinine 1.77 with chronic kidney disease, single kidney.  In August 2020 was admitted with unstable angina, cardiac catheterization resulted in DES to RCA as well as DES to obtuse marginal.  He also has a mid LAD lesion.  Occasional shortness of breath when bending over to tie shoes.  Past Medical History:  Diagnosis Date   Allergic rhinitis    BPH (benign prostatic hyperplasia)    urologist-- dr t. [name]   CAD (coronary artery disease) cardiologsit--- dr [name]   a. 12-14-2011 s/p PCI with DES x2 to midLCFx. b. NSTEMI 06/2020 s/p DES to RCA and staged DES to OM1.   Chronic headaches    due to sinues   ED (erectile dysfunction)    GERD (gastroesophageal reflux disease)    Hematuria    History of acute pyelonephritis    08-11-2019   History of echocardiogram    last one 03-22-2015   mild LVH,   ef 55-60%   History of kidney stones    History of melanoma excision    non-malignant excised from ear   History of MI (myocardial infarction)    per pt remote hx yrs ago prior to 2013   History of nonmelanoma skin cancer    per pt SCC and BCC from back, head, face  excision   History of prostatitis    Hydronephrosis, right    Hyperlipidemia    Moderate obstructive sleep apnea    not used cpap since 2014 bought bed that head would elevate    Osteoarthritis of hand    PONV (postoperative nausea and vomiting)    Pt may have been confused after surgery in the past   Statin intolerance    Wears glasses     Past Surgical History:   Procedure Laterality Date   BONE CYST EXCISION  ~ 2004   left knee   CARDIAC CATHETERIZATION N/A 05/09/2016   Procedure: Left Heart Cath and Coronary Angiography;  Surgeon: Jerline Pain, MD;  Location: [place] [facility];  Service: Cardiovascular;  Laterality: N/A;   CHOLECYSTECTOMY N/A 03/23/2015   Procedure: LAPAROSCOPIC CHOLECYSTECTOMY;  Surgeon: Ralene Ok, MD;  Location: [facility];  Service: General;  Laterality: N/A;   CORONARY ANGIOPLASTY WITH STENT PLACEMENT  12/14/11   "2"   CORONARY STENT INTERVENTION N/A 07/01/2020   Procedure: CORONARY STENT INTERVENTION;  Surgeon: Belva Crome, MD;  Location: [place] [facility];  Service: Cardiovascular;  Laterality: N/A;   CORONARY STENT INTERVENTION N/A 07/05/2020   Procedure: CORONARY STENT INTERVENTION;  Surgeon: Belva Crome, MD;  Location: [place] [facility];  Service: Cardiovascular;  Laterality: N/A;   CYSTOSCOPY/RETROGRADE/URETEROSCOPY Bilateral 09/05/2019   Procedure: CYSTOSCOPY/ BILATERAL RETROGRADE/ RIGHT  URETEROSCOPY with biopsy with laser ablation of tumor;  Surgeon: Alexis Frock, MD;  Location: [facility];  Service: Urology;  Laterality: Bilateral;   ETHMOIDECTOMY Right 09/01/2019   Procedure: RIGHT REVISION ENDOSCOPIC ETHMOIDECTOMY;  Surgeon: Izora Gala, MD;  Location: [facility];  Service: ENT;  Laterality: Right;   FRONTAL SINUSOTOMY Bilateral 07-23-2003   dr Constance Holster   re-do   HOLMIUM LASER APPLICATION Right 60/73/7106   Procedure: HOLMIUM LASER APPLICATION;  Surgeon: Alexis Frock, MD;  Location: [facility];  Service: Urology;  Laterality: Right;   KNEE ARTHROSCOPY Left ` 2010   LEFT HEART CATH AND CORONARY ANGIOGRAPHY N/A 07/01/2020   Procedure: LEFT HEART CATH AND CORONARY ANGIOGRAPHY;  Surgeon: Belva Crome, MD;  Location: [place] [facility];  Service: Cardiovascular;  Laterality: N/A;   LEFT HEART CATHETERIZATION WITH CORONARY ANGIOGRAM N/A 12/14/2011   Procedure:  LEFT HEART CATHETERIZATION WITH CORONARY ANGIOGRAM;  Surgeon: Candee Furbish, MD;  Location: [facility];  Service: Cardiovascular;  Laterality: N/A;   PERCUTANEOUS CORONARY STENT INTERVENTION (PCI-S)  12/14/2011   Procedure: PERCUTANEOUS CORONARY STENT INTERVENTION (PCI-S);  Surgeon: Candee Furbish, MD;  Location: [facility];  Service: Cardiovascular;;   ROBOT ASSITED LAPAROSCOPIC NEPHROURETERECTOMY Right 11/28/2019   Procedure: XI ROBOT ASSITED LAPAROSCOPIC NEPHROURETERECTOMY;  Surgeon: Alexis Frock, MD;  Location: [facility];  Service: Urology;  Laterality: Right;  3 HRS   SEPTOPLASTY WITH ETHMOIDECTOMY, AND MAXILLARY ANTROSTOMY  09-22-2002   dr Constance Holster   and sinusotomy   TOTAL KNEE ARTHROPLASTY Left 05/09/2017   Procedure: LEFT TOTAL KNEE ARTHROPLASTY;  Surgeon: Latanya Maudlin, MD;  Location: [facility];  Service: Orthopedics;  Laterality: Left;    Current Medications: Current Meds  Medication Sig   Alirocumab (PRALUENT) 75 MG/ML SOAJ INJECT 1 PEN INTO THE SKIN EVERY 14 (FOURTEEN) DAYS.   Ascorbic Acid (VITAMIN C PO) Take 1 tablet by mouth daily.    Azelastine HCl 137 MCG/SPRAY SOLN Place 2 sprays into both nostrils 2 (two) times daily.   clopidogrel (PLAVIX) 75 MG tablet Take 1 tablet (75 mg total) by mouth daily.   diphenhydrAMINE (BENADRYL) 25 MG tablet Take 25 mg by mouth every 6 (six) hours as needed for allergies.    EPIPEN 2-PAK 0.3 MG/0.3ML SOAJ injection Inject 0.3 mg into the muscle once as needed (for an anaphylactic reaction).    ipratropium (ATROVENT) 0.06 % nasal spray USE 1-2 SPRAYS IN EACH NOSTRIL EVERY 6 HOURS IF NEEDED TO DRY UP NOSE.   loratadine (CLARITIN) 10 MG tablet Take 10 mg by mouth daily.   metoprolol succinate (TOPROL-XL) 25 MG 24 hr tablet TAKE 1 TABLET BY MOUTH EVERY DAY   Multiple Vitamin (MULTIVITAMIN PO) Take 1 tablet by mouth daily.    nitroGLYCERIN (NITROSTAT) 0.4 MG SL tablet PLACE 1 TABLET UNDER TONGUE EVERY 5 MINUTES AS NEEDED FOR CHEST PAIN MAX 3 TABS/15 MIN    pantoprazole (PROTONIX) 40 MG tablet TAKE 1 TABLET (40 MG TOTAL) BY MOUTH DAILY AFTER BREAKFAST.   sodium fluoride (FLUORISHIELD) 1.1 % GEL dental gel See admin instructions.   tamsulosin (FLOMAX) 0.4 MG CAPS capsule Take 1 capsule (0.4 mg total) by mouth daily.   traMADol (ULTRAM) 50 MG tablet Take 50 mg by mouth every 6 (six) hours as needed (pain).    valsartan (DIOVAN) 160 MG tablet Take 160 mg by mouth daily.   VITAMIN D PO Take 1 tablet by mouth daily.    [DISCONTINUED] aspirin EC 81 MG tablet Take 1 tablet (81 mg total) by mouth daily.     Allergies:   Beef-derived products, Pork-derived products, Codeine, Crestor [rosuvastatin], Dilaudid [hydromorphone hcl], Hydrocodone, Lipitor [atorvastatin], Percocet [oxycodone-acetaminophen], and Pitavastatin   Social History   Socioeconomic History   Marital status: Married  Spouse name: Not on file   Number of children: Not on file   Years of education: Not on file   Highest education level: Not on file  Occupational History   Not on file  Tobacco Use   Smoking status: Former    Packs/day: 2.00    Years: 12.00    Pack years: 24.00    Types: Cigarettes    Quit date: 09/03/1974    Years since quitting: 47.0   Smokeless tobacco: Never  Vaping Use   Vaping Use: Never used  Substance and Sexual Activity   Alcohol use: Not Currently   Drug use: Never   Sexual activity: Not on file  Other Topics Concern   Not on file  Social History Narrative   Not on file   Social Determinants of Health   Financial Resource Strain: Not on file  Food Insecurity: Not on file  Transportation Needs: Not on file  Physical Activity: Not on file  Stress: Not on file  Social Connections: Not on file     Family History: The patient's family history includes Heart attack in his mother; Hypertension in his mother.  ROS:   Please see the history of present illness.     All other systems reviewed and are negative.  EKGs/Labs/Other Studies  Reviewed:    The following studies were reviewed today: Cath 06/2020: Intervention        EKG:  EKG is  ordered today.  The ekg ordered today demonstrates SR 69 with PAC  Recent Labs: 09/20/2020: ALT 12  Recent Lipid Panel    Component Value Date/Time   CHOL 121 09/20/2020 0904   TRIG 89 09/20/2020 0904   HDL 42 09/20/2020 0904   CHOLHDL 2.9 09/20/2020 0904   CHOLHDL 3.6 07/01/2020 0301   VLDL 10 07/01/2020 0301   LDLCALC 62 09/20/2020 0904     Risk Assessment/Calculations:          Physical Exam:    VS:  BP 140/84   Pulse 69   Ht 6\' 1"  (1.854 m)   Wt 197 lb (89.4 kg)   SpO2 98%   BMI 25.99 kg/m     Wt Readings from Last 3 Encounters:  09/06/21 197 lb (89.4 kg)  01/03/21 198 lb (89.8 kg)  09/20/20 192 lb 6.4 oz (87.3 kg)     GEN:  Well nourished, well developed in no acute distress HEENT: Normal NECK: No JVD; No carotid bruits LYMPHATICS: No lymphadenopathy CARDIAC: RRR, no murmurs, rubs, gallops RESPIRATORY:  Clear to auscultation without rales, wheezing or rhonchi  ABDOMEN: Soft, non-tender, non-distended MUSCULOSKELETAL:  No edema; No deformity  SKIN: Warm and dry NEUROLOGIC:  Alert and oriented x 3 PSYCHIATRIC:  Normal affect   ASSESSMENT:    1. Coronary artery disease involving native coronary artery of native heart without angina pectoris   2. Hyperlipidemia, unspecified hyperlipidemia type   3. Atherosclerosis of native coronary artery of native heart without angina pectoris   4. Stage 3a chronic kidney disease (HCC)    PLAN:    In order of problems listed above:  Coronary atherosclerosis of native coronary artery -Circumflex stent 2013 -RCA and OM stent 07/05/2020 -LAD stent as well. -Occasional shortness of breath especially when bending over and tying shoes.  We discussed.  -I think it would be fine for [place] to discontinue the aspirin 81 mg and continue the Plavix 75 at this point.  This will also minimize some of the bruising. -  Continue with Toprol-XL  25 mg once a day for antianginal.  CKD (chronic kidney disease), stage III Creatinine ranges from 1.8-1.5.  Prior nephrectomy in January 2021.  Avoiding NSAIDs.  Hyperlipidemia On Praluent.  Doing well.  Insurance did not cover [place] previously.  He is at goal LDL of less than 70, currently 50.  Excellent.  Appreciate Dr. Lysbeth Penner help.  No side effects.        Medication Adjustments/Labs and Tests Ordered: Current medicines are reviewed at length with the patient today.  Concerns regarding medicines are outlined above.  Orders Placed This Encounter  Procedures   EKG 12-Lead   No orders of the defined types were placed in this encounter.   Patient Instructions  Medication Instructions:   STOP TAKING ASPIRIN NOW BUT PLEASE CONTINUE ALL YOUR OTHER MEDICATIONS AND PLAVIX DAILY  *If you need a refill on your cardiac medications before your next appointment, please call your pharmacy*   Follow-Up: At [facility], you and your health needs are our priority.  As part of our continuing mission to provide you with exceptional heart care, we have created designated Provider Care Teams.  These Care Teams include your primary Cardiologist (physician) and Advanced Practice Providers (APPs -  Physician Assistants and Nurse Practitioners) who all work together to provide you with the care you need, when you need it.  We recommend signing up for the patient portal called "MyChart".  Sign up information is provided on this After Visit Summary.  MyChart is used to connect with patients for Virtual Visits (Telemedicine).  Patients are able to view lab/test results, encounter notes, upcoming appointments, etc.  Non-urgent messages can be sent to your provider as well.   To learn more about what you can do with MyChart, go to [email or web address].    Your next appointment:   1 year(s)  The format for your next appointment:   In Person  Provider:   Candee Furbish,  MD     Signed, Candee Furbish, MD  09/06/2021 10:58 AM    [place]

## 2021-10-15 ENCOUNTER — Other Ambulatory Visit: Payer: Self-pay | Admitting: Cardiology

## 2021-11-09 DIAGNOSIS — R051 Acute cough: Secondary | ICD-10-CM | POA: Diagnosis not present

## 2021-11-09 DIAGNOSIS — J0100 Acute maxillary sinusitis, unspecified: Secondary | ICD-10-CM | POA: Diagnosis not present

## 2021-11-09 DIAGNOSIS — R0981 Nasal congestion: Secondary | ICD-10-CM | POA: Diagnosis not present

## 2021-11-09 DIAGNOSIS — Z20828 Contact with and (suspected) exposure to other viral communicable diseases: Secondary | ICD-10-CM | POA: Diagnosis not present

## 2021-11-22 ENCOUNTER — Telehealth: Payer: Self-pay

## 2021-11-22 DIAGNOSIS — Z79899 Other long term (current) drug therapy: Secondary | ICD-10-CM | POA: Diagnosis not present

## 2021-11-22 DIAGNOSIS — E782 Mixed hyperlipidemia: Secondary | ICD-10-CM | POA: Diagnosis not present

## 2021-11-22 DIAGNOSIS — I2510 Atherosclerotic heart disease of native coronary artery without angina pectoris: Secondary | ICD-10-CM | POA: Diagnosis not present

## 2021-11-22 DIAGNOSIS — Z0000 Encounter for general adult medical examination without abnormal findings: Secondary | ICD-10-CM | POA: Diagnosis not present

## 2021-11-22 DIAGNOSIS — Z23 Encounter for immunization: Secondary | ICD-10-CM | POA: Diagnosis not present

## 2021-11-22 DIAGNOSIS — Z955 Presence of coronary angioplasty implant and graft: Secondary | ICD-10-CM | POA: Diagnosis not present

## 2021-11-22 DIAGNOSIS — R0989 Other specified symptoms and signs involving the circulatory and respiratory systems: Secondary | ICD-10-CM | POA: Diagnosis not present

## 2021-11-22 NOTE — Telephone Encounter (Signed)
Pt's Daughter called concerned because he had labs done at his PCP this morning ([facility] - Serita Grammes, MD). They called the pt and told him that his Potassium was 6 and they want to repeat in the morning. The Daughter was concerned and called [place] to get our input. I spoke with Ermalinda Barrios, PA and she suggested he have a STAT repeat lab today.   I called the Daughter back and she ask me to call the PCP office to get more details.  I called and spoke with the PCP and she stated that the lab was repeated twice and was 6 both times but that his Renal Function was worse and she feels like that may be playing a part in this number. She listened to the pt and his rhythm sounded good and he had no sx.  She advised me to tell the Daughter to have him at her office first thing in the morning for a STAT repeat lab and if it is still elevated she wants to admit him since his Renal function is worse she feels like he needs to be monitored closely.  She was also advised to get him to the ED if he starts having palpitations or feeling bad through the night.    I called the Daughter and she agreed with this plan and will call us tomorrow as soon as they know the labs and the plan from the PCP.

## 2021-11-23 DIAGNOSIS — E875 Hyperkalemia: Secondary | ICD-10-CM | POA: Diagnosis not present

## 2021-11-25 DIAGNOSIS — E871 Hypo-osmolality and hyponatremia: Secondary | ICD-10-CM | POA: Diagnosis not present

## 2021-12-08 ENCOUNTER — Other Ambulatory Visit: Payer: Self-pay | Admitting: Cardiology

## 2021-12-15 DIAGNOSIS — C651 Malignant neoplasm of right renal pelvis: Secondary | ICD-10-CM | POA: Diagnosis not present

## 2021-12-22 DIAGNOSIS — R339 Retention of urine, unspecified: Secondary | ICD-10-CM | POA: Diagnosis not present

## 2021-12-22 DIAGNOSIS — M545 Low back pain, unspecified: Secondary | ICD-10-CM | POA: Diagnosis not present

## 2021-12-22 DIAGNOSIS — C651 Malignant neoplasm of right renal pelvis: Secondary | ICD-10-CM | POA: Diagnosis not present

## 2021-12-22 DIAGNOSIS — Z905 Acquired absence of kidney: Secondary | ICD-10-CM | POA: Diagnosis not present

## 2022-02-21 DIAGNOSIS — C44319 Basal cell carcinoma of skin of other parts of face: Secondary | ICD-10-CM | POA: Diagnosis not present

## 2022-02-21 DIAGNOSIS — L578 Other skin changes due to chronic exposure to nonionizing radiation: Secondary | ICD-10-CM | POA: Diagnosis not present

## 2022-02-21 DIAGNOSIS — L570 Actinic keratosis: Secondary | ICD-10-CM | POA: Diagnosis not present

## 2022-02-21 DIAGNOSIS — L821 Other seborrheic keratosis: Secondary | ICD-10-CM | POA: Diagnosis not present

## 2022-03-09 DIAGNOSIS — J019 Acute sinusitis, unspecified: Secondary | ICD-10-CM | POA: Diagnosis not present

## 2022-03-09 DIAGNOSIS — H9202 Otalgia, left ear: Secondary | ICD-10-CM | POA: Diagnosis not present

## 2022-04-24 ENCOUNTER — Other Ambulatory Visit: Payer: Self-pay | Admitting: Cardiology

## 2022-05-20 ENCOUNTER — Other Ambulatory Visit: Payer: Self-pay | Admitting: Cardiology

## 2022-05-20 DIAGNOSIS — I2510 Atherosclerotic heart disease of native coronary artery without angina pectoris: Secondary | ICD-10-CM

## 2022-06-23 ENCOUNTER — Other Ambulatory Visit: Payer: Self-pay | Admitting: Cardiology

## 2022-06-23 DIAGNOSIS — I2510 Atherosclerotic heart disease of native coronary artery without angina pectoris: Secondary | ICD-10-CM

## 2022-08-28 ENCOUNTER — Other Ambulatory Visit: Payer: Self-pay | Admitting: Cardiology

## 2022-09-27 ENCOUNTER — Ambulatory Visit: Payer: Medicare Other | Attending: Cardiology | Admitting: Cardiology

## 2022-09-27 VITALS — BP 116/78 | HR 95 | Ht 73.0 in | Wt 187.6 lb

## 2022-09-27 DIAGNOSIS — E785 Hyperlipidemia, unspecified: Secondary | ICD-10-CM | POA: Diagnosis not present

## 2022-09-27 DIAGNOSIS — I209 Angina pectoris, unspecified: Secondary | ICD-10-CM

## 2022-09-27 DIAGNOSIS — I2510 Atherosclerotic heart disease of native coronary artery without angina pectoris: Secondary | ICD-10-CM

## 2022-09-27 DIAGNOSIS — Z789 Other specified health status: Secondary | ICD-10-CM | POA: Diagnosis not present

## 2022-09-27 DIAGNOSIS — R079 Chest pain, unspecified: Secondary | ICD-10-CM

## 2022-09-27 DIAGNOSIS — N1831 Chronic kidney disease, stage 3a: Secondary | ICD-10-CM | POA: Diagnosis not present

## 2022-09-27 MED ORDER — ISOSORBIDE MONONITRATE ER 30 MG PO TB24: 30.0000 mg | ORAL_TABLET | Freq: Every day | ORAL | 3 refills | Status: DC

## 2022-09-27 NOTE — Patient Instructions (Signed)
Medication Instructions:  Your physician has recommended you make the following change in your medication:   Start Imdur '30mg'$  daily   *If you need a refill on your cardiac medications before your next appointment, please call your pharmacy*   Follow-Up: At [facility], you and your health needs are our priority.  As part of our continuing mission to provide you with exceptional heart care, we have created designated Provider Care Teams.  These Care Teams include your primary Cardiologist (physician) and Advanced Practice Providers (APPs -  Physician Assistants and Nurse Practitioners) who all work together to provide you with the care you need, when you need it.  We recommend signing up for the patient portal called "MyChart".  Sign up information is provided on this After Visit Summary.  MyChart is used to connect with patients for Virtual Visits (Telemedicine).  Patients are able to view lab/test results, encounter notes, upcoming appointments, etc.  Non-urgent messages can be sent to your provider as well.   To learn more about what you can do with MyChart, go to [email or web address].    Your next appointment:   6 month(s)  The format for your next appointment:   In Person  Provider:   Candee Furbish, MD     Important Information About Sugar

## 2022-09-27 NOTE — Progress Notes (Signed)
Cardiology Office Note:    Date:  09/27/2022   ID:  Sean Hammond, DOB 09-23-1941, MRN [id number]  PCP:  Josetta Huddle, MD   [facility] Providers Cardiologist:  Candee Furbish, MD     Referring MD: Josetta Huddle, MD    History of Present Illness:    Sean Hammond is a 81 y.o. male here for follow up of CAD.  He had labs performed at a PCP visit 11/22/2021 which revealed Potassium levels of 6. Repeat labs showed the same result. His daughter reported that his Renal function was worse and was concerned this might be contributing.   Has hyperlipidemia, statin intolerance, on Praluent. Praluent was very expensive at the beginning of the year.  Discussed deductible.  Prior creatinine 1.77 with chronic kidney disease, single kidney.  In August 2020 was admitted with unstable angina, cardiac catheterization resulted in DES to RCA as well as DES to obtuse marginal.  He also has a mid LAD lesion.  Occasional shortness of breath when bending over to tie shoes.  Today, he appears to be doing well.  He states that he experiences occasional chest pain.  Additionally, he sometimes has some exertional shortness of breath.  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   He denies any palpitations or peripheral edema. No lightheadedness, headaches, syncope, orthopnea, or PND.  Past Medical History:  Diagnosis Date   Allergic rhinitis    BPH (benign prostatic hyperplasia)    urologist-- dr t. [name]   CAD (coronary artery disease) cardiologsit--- dr [name]   a. 12-14-2011 s/p PCI with DES x2 to midLCFx. b. NSTEMI 06/2020 s/p DES to RCA and staged DES to OM1.   Chronic headaches    due to sinues   ED (erectile dysfunction)    GERD (gastroesophageal reflux disease)    Hematuria    History of acute pyelonephritis    08-11-2019   History of echocardiogram    last one 03-22-2015   mild LVH,   ef 55-60%   History of  kidney stones    History of melanoma excision    non-malignant excised from ear   History of MI (myocardial infarction)    per pt remote hx yrs ago prior to 2013   History of nonmelanoma skin cancer    per pt SCC and BCC from back, head, face  excision   History of prostatitis    Hydronephrosis, right    Hyperlipidemia    Moderate obstructive sleep apnea    not used cpap since 2014 bought bed that head would elevate    Osteoarthritis of hand    PONV (postoperative nausea and vomiting)    Pt may have been confused after surgery in the past   Statin intolerance    Wears glasses     Past Surgical History:  Procedure Laterality Date   BONE CYST EXCISION  ~ 2004   left knee   CARDIAC CATHETERIZATION N/A 05/09/2016   Procedure: Left Heart Cath and Coronary Angiography;  Surgeon: Jerline Pain, MD;  Location: [place] [facility];  Service: Cardiovascular;  Laterality: N/A;   CHOLECYSTECTOMY N/A 03/23/2015   Procedure: LAPAROSCOPIC CHOLECYSTECTOMY;  Surgeon: Ralene Ok, MD;  Location: [facility];  Service: General;  Laterality: N/A;   CORONARY ANGIOPLASTY WITH STENT PLACEMENT  12/14/11   "2"   CORONARY STENT INTERVENTION N/A 07/01/2020   Procedure: CORONARY STENT INTERVENTION;  Surgeon:  Belva Crome, MD;  Location: [place] [facility];  Service: Cardiovascular;  Laterality: N/A;   CORONARY STENT INTERVENTION N/A 07/05/2020   Procedure: CORONARY STENT INTERVENTION;  Surgeon: Belva Crome, MD;  Location: [place] [facility];  Service: Cardiovascular;  Laterality: N/A;   CYSTOSCOPY/RETROGRADE/URETEROSCOPY Bilateral 09/05/2019   Procedure: CYSTOSCOPY/ BILATERAL RETROGRADE/ RIGHT  URETEROSCOPY with biopsy with laser ablation of tumor;  Surgeon: Alexis Frock, MD;  Location: [facility];  Service: Urology;  Laterality: Bilateral;   ETHMOIDECTOMY Right 09/01/2019   Procedure: RIGHT REVISION ENDOSCOPIC ETHMOIDECTOMY;  Surgeon: Izora Gala, MD;  Location: [place];   Service: ENT;  Laterality: Right;   FRONTAL SINUSOTOMY Bilateral 07-23-2003   dr Constance Holster   re-do   HOLMIUM LASER APPLICATION Right 56/81/2751   Procedure: HOLMIUM LASER APPLICATION;  Surgeon: Alexis Frock, MD;  Location: [facility];  Service: Urology;  Laterality: Right;   KNEE ARTHROSCOPY Left ` 2010   LEFT HEART CATH AND CORONARY ANGIOGRAPHY N/A 07/01/2020   Procedure: LEFT HEART CATH AND CORONARY ANGIOGRAPHY;  Surgeon: Belva Crome, MD;  Location: [place] [facility];  Service: Cardiovascular;  Laterality: N/A;   LEFT HEART CATHETERIZATION WITH CORONARY ANGIOGRAM N/A 12/14/2011   Procedure: LEFT HEART CATHETERIZATION WITH CORONARY ANGIOGRAM;  Surgeon: Candee Furbish, MD;  Location: [facility];  Service: Cardiovascular;  Laterality: N/A;   PERCUTANEOUS CORONARY STENT INTERVENTION (PCI-S)  12/14/2011   Procedure: PERCUTANEOUS CORONARY STENT INTERVENTION (PCI-S);  Surgeon: Candee Furbish, MD;  Location: [facility];  Service: Cardiovascular;;   ROBOT ASSITED LAPAROSCOPIC NEPHROURETERECTOMY Right 11/28/2019   Procedure: XI ROBOT ASSITED LAPAROSCOPIC NEPHROURETERECTOMY;  Surgeon: Alexis Frock, MD;  Location: [facility];  Service: Urology;  Laterality: Right;  3 HRS   SEPTOPLASTY WITH ETHMOIDECTOMY, AND MAXILLARY ANTROSTOMY  09-22-2002   dr Constance Holster   and sinusotomy   TOTAL KNEE ARTHROPLASTY Left 05/09/2017   Procedure: LEFT TOTAL KNEE ARTHROPLASTY;  Surgeon: Latanya Maudlin, MD;  Location: [facility];  Service: Orthopedics;  Laterality: Left;    Current Medications: Current Meds  Medication Sig   Alirocumab (PRALUENT) 75 MG/ML SOAJ INJECT 1 PEN INTO THE SKIN EVERY 14 (FOURTEEN) DAYS.   Ascorbic Acid (VITAMIN C PO) Take 1 tablet by mouth daily.    Azelastine HCl 137 MCG/SPRAY SOLN Place 2 sprays into both nostrils 2 (two) times daily.   clopidogrel (PLAVIX) 75 MG tablet TAKE 1 TABLET BY MOUTH EVERY DAY   diphenhydrAMINE (BENADRYL) 25 MG tablet Take 25 mg by mouth every 6 (six) hours as needed  for allergies.    EPIPEN 2-PAK 0.3 MG/0.3ML SOAJ injection Inject 0.3 mg into the muscle once as needed (for an anaphylactic reaction).    ipratropium (ATROVENT) 0.06 % nasal spray USE 1-2 SPRAYS IN EACH NOSTRIL EVERY 6 HOURS IF NEEDED TO DRY UP NOSE.   isosorbide mononitrate (IMDUR) 30 MG 24 hr tablet Take 1 tablet (30 mg total) by mouth daily.   loratadine (CLARITIN) 10 MG tablet Take 10 mg by mouth daily.   metoprolol succinate (TOPROL-XL) 25 MG 24 hr tablet Take 1 tablet (25 mg total) by mouth daily. Pt needs to Keep Nov appt for future refills.   Multiple Vitamin (MULTIVITAMIN PO) Take 1 tablet by mouth daily.    nitroGLYCERIN (NITROSTAT) 0.4 MG SL tablet PLACE 1 TABLET UNDER TONGUE EVERY 5 MINUTES AS NEEDED FOR CHEST PAIN MAX 3 TABS/15 MIN   pantoprazole (PROTONIX) 40 MG tablet TAKE 1 TABLET (40 MG TOTAL) BY MOUTH  DAILY AFTER BREAKFAST.   sodium fluoride (FLUORISHIELD) 1.1 % GEL dental gel See admin instructions.   tamsulosin (FLOMAX) 0.4 MG CAPS capsule Take 1 capsule (0.4 mg total) by mouth daily.   traMADol (ULTRAM) 50 MG tablet Take 50 mg by mouth every 6 (six) hours as needed (pain).    valsartan (DIOVAN) 160 MG tablet Take 160 mg by mouth daily.   VITAMIN D PO Take 1 tablet by mouth daily.      Allergies:   Beef-derived products, Pork-derived products, Codeine, Crestor [rosuvastatin], Dilaudid [hydromorphone hcl], Hydrocodone, Lipitor [atorvastatin], Percocet [oxycodone-acetaminophen], and Pitavastatin   Social History   Socioeconomic History   Marital status: Married    Spouse name: Not on file   Number of children: Not on file   Years of education: Not on file   Highest education level: Not on file  Occupational History   Not on file  Tobacco Use   Smoking status: Former    Packs/day: 2.00    Years: 12.00    Total pack years: 24.00    Types: Cigarettes    Quit date: 09/03/1974    Years since quitting: 48.0   Smokeless tobacco: Never  Vaping Use   Vaping Use: Never  used  Substance and Sexual Activity   Alcohol use: Not Currently   Drug use: Never   Sexual activity: Not on file  Other Topics Concern   Not on file  Social History Narrative   Not on file   Social Determinants of Health   Financial Resource Strain: Not on file  Food Insecurity: Not on file  Transportation Needs: Not on file  Physical Activity: Not on file  Stress: Not on file  Social Connections: Not on file     Family History: The patient's family history includes Heart attack in his mother; Hypertension in his mother.  ROS:   Please see the history of present illness.    (+) Occasional chest pain (+) Exertional shortness of breath  All other systems reviewed and are negative.  EKGs/Labs/Other Studies Reviewed:    The following studies were reviewed today:  Left Heart Cath 06/2020:  A stent was successfully placed.   First obtuse marginal 85% mid stenosis reduced to 0% with TIMI grade III flow using a 2.25 x 12 Onyx deployed at 12 atm.   RECOMMENDATIONS: Aspirin and Plavix x12 months.  Then consider decreasing to Plavix monotherapy thereafter. Plan discharge later today.   Intervention        EKG: EKG is personally reviewed. 09/27/22: Sinus rhythm. Rate 60 bpm. 09/06/21: SR 69 with PAC  Recent Labs: No results found for requested labs within last 365 days.  Recent Lipid Panel    Component Value Date/Time   CHOL 121 09/20/2020 0904   TRIG 89 09/20/2020 0904   HDL 42 09/20/2020 0904   CHOLHDL 2.9 09/20/2020 0904   CHOLHDL 3.6 07/01/2020 0301   VLDL 10 07/01/2020 0301   LDLCALC 62 09/20/2020 0904     Risk Assessment/Calculations:         Physical Exam:    VS:  BP 116/78   Pulse 95   Ht _0  (1.854 m)   Wt 187 lb 9.6 oz (85.1 kg)   SpO2 95%   BMI 24.75 kg/m     Wt Readings from Last 3 Encounters:  09/27/22 187 lb 9.6 oz (85.1 kg)  09/06/21 197 lb (89.4 kg)  01/03/21 198 lb (89.8 kg)     GEN:  Well nourished, well  developed in no  acute distress HEENT: Normal NECK: No JVD; No carotid bruits LYMPHATICS: No lymphadenopathy CARDIAC: RRR, no murmurs, rubs, gallops RESPIRATORY:  Clear to auscultation without rales, wheezing or rhonchi  ABDOMEN: Soft, non-tender, non-distended MUSCULOSKELETAL:  No edema; No deformity  SKIN: Warm and dry NEUROLOGIC:  Alert and oriented x 3 PSYCHIATRIC:  Normal affect   ASSESSMENT:    1. Coronary artery disease involving native coronary artery of native heart without angina pectoris   2. Hyperlipidemia, unspecified hyperlipidemia type   3. Atherosclerosis of native coronary artery of native heart without angina pectoris   4. Stage 3a chronic kidney disease (HCC)     PLAN:    In order of problems listed above:  Coronary atherosclerosis of native coronary artery -Circumflex stent 2013 -RCA and OM stent 07/05/2020 -LAD stent as well. - Still having some chest discomfort with activity, typical anginal type symptoms some shortness of breath as well.  - Currently on Plavix monotherapy. -We will go ahead and start back isosorbide 30 mg once a day.  Several years ago we had attempted this in the past.  Discussed potential headache side effect. - Continue with Toprol-XL 25 mg once a day for antianginal.  He will let us know if there is any worsening symptoms.   CKD (chronic kidney disease), stage III Creatinine ranges from 1.8-1.5.  Prior nephrectomy in January 2021.  Avoiding NSAIDs.  Most recent creatinine 1.4.   Hyperlipidemia On Praluent.  Doing well.  Insurance did not cover [place] previously.  He is at goal LDL of less than 70, currently 50.  Excellent.  Appreciate Dr. Lysbeth Penner help.  No side effects.  Doing very well.  Protective.     Follow up: 6 months.   Medication Adjustments/Labs and Tests Ordered: Current medicines are reviewed at length with the patient today.  Concerns regarding medicines are outlined above.  Orders Placed This Encounter  Procedures   EKG 12-Lead    Meds ordered this encounter  Medications   isosorbide mononitrate (IMDUR) 30 MG 24 hr tablet    Sig: Take 1 tablet (30 mg total) by mouth daily.    Dispense:  90 tablet    Refill:  3    Patient Instructions  Medication Instructions:  Your physician has recommended you make the following change in your medication:   Start Imdur 54m daily   *If you need a refill on your cardiac medications before your next appointment, please call your pharmacy*   Follow-Up: [facility] and your health needs are our priority.  As part of our continuing mission to provide you with exceptional heart care, we have created designated Provider Care Teams.  These Care Teams include your primary Cardiologist (physician) and Advanced Practice Providers (APPs -  Physician Assistants and Nurse Practitioners) who all work together to provide you with the care you need, when you need it.  We recommend signing up for the patient portal called "MyChart".  Sign up information is provided on this After Visit Summary.  MyChart is used to connect with patients for Virtual Visits (Telemedicine).  Patients are able to view lab/test results, encounter notes, upcoming appointments, etc.  Non-urgent messages can be sent to your provider as well.   To learn more about what you can do with MyChart, go to [email or web address]    Your next appointment:   6 month(s)  The format for your next appointment:   In Person  Provider:   MCandee Furbish MD  Important Information About Sugar          I,Breanna Adamick,acting as a scribe for Candee Furbish, MD.,have documented all relevant documentation on the behalf of Candee Furbish, MD,as directed by  Candee Furbish, MD while in the presence of Candee Furbish, MD.  I, Candee Furbish, MD, have reviewed all documentation for this visit. The documentation on 09/27/22 for the exam, diagnosis, procedures, and orders are all accurate and complete.   Signed, Candee Furbish,  MD  09/27/2022 9:49 AM     [facility]

## 2022-10-10 ENCOUNTER — Other Ambulatory Visit: Payer: Self-pay | Admitting: Cardiology

## 2022-11-14 ENCOUNTER — Other Ambulatory Visit: Payer: Self-pay | Admitting: Cardiology

## 2022-12-18 DIAGNOSIS — C651 Malignant neoplasm of right renal pelvis: Secondary | ICD-10-CM | POA: Diagnosis not present

## 2022-12-20 ENCOUNTER — Ambulatory Visit (HOSPITAL_COMMUNITY)
Admission: RE | Admit: 2022-12-20 | Discharge: 2022-12-20 | Disposition: A | Discharge status: Home / Self Care | Payer: Medicare Other | Source: Ambulatory Visit | Attending: Urology | Admitting: Urology

## 2022-12-20 ENCOUNTER — Other Ambulatory Visit (HOSPITAL_COMMUNITY): Payer: Self-pay | Admitting: Urology

## 2022-12-20 DIAGNOSIS — C641 Malignant neoplasm of right kidney, except renal pelvis: Secondary | ICD-10-CM | POA: Diagnosis not present

## 2022-12-20 DIAGNOSIS — C651 Malignant neoplasm of right renal pelvis: Secondary | ICD-10-CM | POA: Insufficient documentation

## 2022-12-20 DIAGNOSIS — R051 Acute cough: Secondary | ICD-10-CM | POA: Diagnosis not present

## 2022-12-20 DIAGNOSIS — J324 Chronic pansinusitis: Secondary | ICD-10-CM | POA: Diagnosis not present

## 2022-12-20 DIAGNOSIS — R519 Headache, unspecified: Secondary | ICD-10-CM | POA: Diagnosis not present

## 2022-12-20 DIAGNOSIS — R0981 Nasal congestion: Secondary | ICD-10-CM | POA: Diagnosis not present

## 2022-12-21 ENCOUNTER — Telehealth: Payer: Self-pay | Admitting: Pharmacist

## 2022-12-21 MED ORDER — REPATHA SURECLICK 140 MG/ML ~~LOC~~ SOAJ: 1.0000 | SUBCUTANEOUS | 11 refills | Status: DC

## 2022-12-21 NOTE — Telephone Encounter (Signed)
Repatha prior auth submitted and approved through 12/21/23. Key: [id number]. Pt aware of med change to Repatha, rx sent to pharmacy.

## 2022-12-21 NOTE — Telephone Encounter (Signed)
-----  Message from Shellia Cleverly, RN sent at 12/21/2022 10:01 AM EST ----- Regarding: [employer] - pt using Praluent [employer] advised Repatha is their preferred list now.  Thank you

## 2022-12-22 DIAGNOSIS — C651 Malignant neoplasm of right renal pelvis: Secondary | ICD-10-CM | POA: Diagnosis not present

## 2022-12-22 DIAGNOSIS — K5730 Diverticulosis of large intestine without perforation or abscess without bleeding: Secondary | ICD-10-CM | POA: Diagnosis not present

## 2022-12-22 DIAGNOSIS — N281 Cyst of kidney, acquired: Secondary | ICD-10-CM | POA: Diagnosis not present

## 2023-01-02 DIAGNOSIS — R339 Retention of urine, unspecified: Secondary | ICD-10-CM | POA: Diagnosis not present

## 2023-01-02 DIAGNOSIS — C651 Malignant neoplasm of right renal pelvis: Secondary | ICD-10-CM | POA: Diagnosis not present

## 2023-01-23 DIAGNOSIS — I2510 Atherosclerotic heart disease of native coronary artery without angina pectoris: Secondary | ICD-10-CM | POA: Diagnosis not present

## 2023-01-23 DIAGNOSIS — I10 Essential (primary) hypertension: Secondary | ICD-10-CM | POA: Diagnosis not present

## 2023-01-23 DIAGNOSIS — H8113 Benign paroxysmal vertigo, bilateral: Secondary | ICD-10-CM | POA: Diagnosis not present

## 2023-01-23 DIAGNOSIS — Z79899 Other long term (current) drug therapy: Secondary | ICD-10-CM | POA: Diagnosis not present

## 2023-01-23 DIAGNOSIS — R7302 Impaired glucose tolerance (oral): Secondary | ICD-10-CM | POA: Diagnosis not present

## 2023-01-23 DIAGNOSIS — N1832 Chronic kidney disease, stage 3b: Secondary | ICD-10-CM | POA: Diagnosis not present

## 2023-01-23 DIAGNOSIS — Z0000 Encounter for general adult medical examination without abnormal findings: Secondary | ICD-10-CM | POA: Diagnosis not present

## 2023-01-24 LAB — LAB REPORT - SCANNED
A1c: 5.9
Albumin, Urine POC: <3
Creatinine, POC: 56.1 mg/dL
EGFR: 38
Microalb Creat Ratio: <5

## 2023-02-04 ENCOUNTER — Emergency Department (HOSPITAL_BASED_OUTPATIENT_CLINIC_OR_DEPARTMENT_OTHER): Payer: Medicare Other

## 2023-02-04 ENCOUNTER — Encounter (HOSPITAL_BASED_OUTPATIENT_CLINIC_OR_DEPARTMENT_OTHER): Payer: Self-pay | Admitting: Emergency Medicine

## 2023-02-04 ENCOUNTER — Other Ambulatory Visit: Payer: Self-pay

## 2023-02-04 ENCOUNTER — Emergency Department (HOSPITAL_BASED_OUTPATIENT_CLINIC_OR_DEPARTMENT_OTHER)
Admission: EM | Admit: 2023-02-04 | Discharge: 2023-02-04 | Disposition: A | Discharge status: Home / Self Care | Payer: Medicare Other | Attending: Emergency Medicine | Admitting: Emergency Medicine

## 2023-02-04 DIAGNOSIS — I252 Old myocardial infarction: Secondary | ICD-10-CM | POA: Insufficient documentation

## 2023-02-04 DIAGNOSIS — R5383 Other fatigue: Secondary | ICD-10-CM | POA: Insufficient documentation

## 2023-02-04 DIAGNOSIS — Z1152 Encounter for screening for COVID-19: Secondary | ICD-10-CM | POA: Insufficient documentation

## 2023-02-04 DIAGNOSIS — R0602 Shortness of breath: Secondary | ICD-10-CM | POA: Diagnosis not present

## 2023-02-04 DIAGNOSIS — R0789 Other chest pain: Secondary | ICD-10-CM | POA: Diagnosis not present

## 2023-02-04 DIAGNOSIS — I700 Atherosclerosis of aorta: Secondary | ICD-10-CM | POA: Insufficient documentation

## 2023-02-04 DIAGNOSIS — I2510 Atherosclerotic heart disease of native coronary artery without angina pectoris: Secondary | ICD-10-CM | POA: Insufficient documentation

## 2023-02-04 DIAGNOSIS — R079 Chest pain, unspecified: Secondary | ICD-10-CM

## 2023-02-04 DIAGNOSIS — N189 Chronic kidney disease, unspecified: Secondary | ICD-10-CM | POA: Insufficient documentation

## 2023-02-04 DIAGNOSIS — R221 Localized swelling, mass and lump, neck: Secondary | ICD-10-CM | POA: Diagnosis not present

## 2023-02-04 HISTORY — DX: Essential (primary) hypertension: I10

## 2023-02-04 LAB — BASIC METABOLIC PANEL
Anion gap: 5 (ref 5–15)
BUN: 29 mg/dL — ABNORMAL HIGH (ref 8–23)
CO2: 22 mmol/L (ref 22–32)
Calcium: 8.8 mg/dL — ABNORMAL LOW (ref 8.9–10.3)
Chloride: 109 mmol/L (ref 98–111)
Creatinine, Ser: 1.93 mg/dL — ABNORMAL HIGH (ref 0.61–1.24)
GFR, Estimated: 35 mL/min — ABNORMAL LOW (ref >60)
Glucose, Bld: 114 mg/dL — ABNORMAL HIGH (ref 70–99)
Potassium: 4.3 mmol/L (ref 3.5–5.1)
Sodium: 136 mmol/L (ref 135–145)

## 2023-02-04 LAB — HEPATIC FUNCTION PANEL
ALT: 11 U/L (ref 0–44)
AST: 24 U/L (ref 15–41)
Albumin: 3.3 g/dL — ABNORMAL LOW (ref 3.5–5.0)
Alkaline Phosphatase: 45 U/L (ref 38–126)
Bilirubin, Direct: 0.2 mg/dL (ref 0.0–0.2)
Indirect Bilirubin: 0.6 mg/dL (ref 0.3–0.9)
Total Bilirubin: 0.8 mg/dL (ref 0.3–1.2)
Total Protein: 6.1 g/dL — ABNORMAL LOW (ref 6.5–8.1)

## 2023-02-04 LAB — RESP PANEL BY RT-PCR (RSV, FLU A&B, COVID)  RVPGX2
Influenza A by PCR: NEGATIVE
Influenza B by PCR: NEGATIVE
Resp Syncytial Virus by PCR: NEGATIVE
SARS Coronavirus 2 by RT PCR: NEGATIVE

## 2023-02-04 LAB — LIPASE, BLOOD: Lipase: 31 U/L (ref 11–51)

## 2023-02-04 LAB — TSH: TSH: 1.863 u[IU]/mL (ref 0.350–4.500)

## 2023-02-04 LAB — CBC
HCT: 38.4 % — ABNORMAL LOW (ref 39.0–52.0)
Hemoglobin: 12.9 g/dL — ABNORMAL LOW (ref 13.0–17.0)
MCH: 30.4 pg (ref 26.0–34.0)
MCHC: 33.6 g/dL (ref 30.0–36.0)
MCV: 90.4 fL (ref 80.0–100.0)
Platelets: 163 10*3/uL (ref 150–400)
RBC: 4.25 MIL/uL (ref 4.22–5.81)
RDW: 13.6 % (ref 11.5–15.5)
WBC: 6 10*3/uL (ref 4.0–10.5)
nRBC: 0 % (ref 0.0–0.2)

## 2023-02-04 LAB — BRAIN NATRIURETIC PEPTIDE: B Natriuretic Peptide: 149.6 pg/mL — ABNORMAL HIGH (ref 0.0–100.0)

## 2023-02-04 LAB — TROPONIN I (HIGH SENSITIVITY)
Troponin I (High Sensitivity): 9 ng/L (ref <18)
Troponin I (High Sensitivity): 9 ng/L (ref <18)

## 2023-02-04 MED ORDER — IOHEXOL 350 MG/ML SOLN
70.0000 mL | Freq: Once | INTRAVENOUS | Status: AC | PRN
  Administered 2023-02-04: 70 mL via INTRAVENOUS

## 2023-02-04 NOTE — ED Provider Notes (Signed)
[place] [facility] Provider Note   CSN: [id number] Arrival date & time: 02/04/23  1611     History  Chief Complaint  Patient presents with   Sean Hammond is a 82 y.o. male.  The history is provided by the patient and medical records. No language interpreter was used.  Sean of Breath Severity:  Severe Onset quality:  Gradual Duration:  2 weeks Timing:  Intermittent Progression:  Waxing and waning Chronicity:  Recurrent Context: not URI   Relieved by:  Rest Worsened by:  Exertion Ineffective treatments:  None tried Associated symptoms: chest pain   Associated symptoms: no abdominal pain, no cough, no diaphoresis, no fever, no headaches, no neck pain, no rash, no sputum production, no vomiting and no wheezing   Risk factors: no hx of PE/DVT        Home Medications Prior to Admission medications   Medication Sig Start Date End Date Taking? Authorizing Provider  Ascorbic Acid (VITAMIN C PO) Take 1 tablet by mouth daily.     [provider]  Azelastine HCl 137 MCG/SPRAY SOLN Place 2 sprays into both nostrils 2 (two) times daily. 07/18/21   [provider]  clopidogrel (PLAVIX) 75 MG tablet Take 1 tablet (75 mg total) by mouth daily. 10/10/22   Jerline Pain, MD  diphenhydrAMINE (BENADRYL) 25 MG tablet Take 25 mg by mouth every 6 (six) hours as needed for allergies.     [provider]  EPIPEN 2-PAK 0.3 MG/0.3ML SOAJ injection Inject 0.3 mg into the muscle once as needed (for an anaphylactic reaction).  03/17/14   [provider]  Evolocumab (REPATHA SURECLICK) [id number] MG/ML SOAJ Inject 140 mg into the skin every 14 (fourteen) days. 12/21/22   Jerline Pain, MD  ipratropium (ATROVENT) 0.06 % nasal spray USE 1-2 SPRAYS IN EACH NOSTRIL EVERY 6 HOURS IF NEEDED TO DRY UP NOSE. 01/24/21   Kozlow, Donnamarie Poag, MD  isosorbide mononitrate (IMDUR) 30 MG 24 hr tablet Take 1 tablet (30 mg total) by  mouth daily. 09/27/22   Jerline Pain, MD  loratadine (CLARITIN) 10 MG tablet Take 10 mg by mouth daily. 04/27/21   [provider]  metoprolol succinate (TOPROL-XL) 25 MG 24 hr tablet Take 1 tablet (25 mg total) by mouth daily. 11/14/22   Jerline Pain, MD  Multiple Vitamin (MULTIVITAMIN PO) Take 1 tablet by mouth daily.     [provider]  nitroGLYCERIN (NITROSTAT) 0.4 MG SL tablet PLACE 1 TABLET UNDER TONGUE EVERY 5 MINUTES AS NEEDED FOR CHEST PAIN MAX 3 TABS/15 MIN 10/18/21   Jerline Pain, MD  pantoprazole (PROTONIX) 40 MG tablet TAKE 1 TABLET (40 MG TOTAL) BY MOUTH DAILY AFTER BREAKFAST. 08/28/22   Jerline Pain, MD  sodium fluoride (FLUORISHIELD) 1.1 % GEL dental gel See admin instructions. 07/18/21   [provider]  tamsulosin (FLOMAX) 0.4 MG CAPS capsule Take 1 capsule (0.4 mg total) by mouth daily. 05/09/17   Porterfield, Amber, PA-C  traMADol (ULTRAM) 50 MG tablet Take 50 mg by mouth every 6 (six) hours as needed (pain).  06/09/20   [provider]  valsartan (DIOVAN) 160 MG tablet Take 160 mg by mouth daily. 12/13/20   [provider]  VITAMIN D PO Take 1 tablet by mouth daily.     [provider]      Allergies    Beef-derived products, Pork-derived products, Codeine, Crestor [rosuvastatin],  Dilaudid [hydromorphone hcl], Hydrocodone, Lipitor [atorvastatin], Percocet [oxycodone-acetaminophen], and Pitavastatin    Review of Systems   Review of Systems  Constitutional:  Positive for fatigue. Negative for chills, diaphoresis and fever.  HENT:  Negative for congestion.   Respiratory:  Positive for Sean of breath. Negative for cough, sputum production, chest tightness, wheezing and stridor.   Cardiovascular:  Positive for chest pain. Negative for palpitations and leg swelling.  Gastrointestinal:  Negative for abdominal pain, constipation, diarrhea, nausea and vomiting.  Genitourinary:  Negative for dysuria and flank pain.   Musculoskeletal:  Negative for back pain, neck pain and neck stiffness.  Skin:  Negative for rash and wound.  Neurological:  Negative for weakness, light-headedness, numbness and headaches.  Psychiatric/Behavioral:  Negative for agitation and confusion.   All other systems reviewed and are negative.   Physical Exam Updated Vital Signs BP 112/71   Pulse 86   Temp 97.6 F (36.4 C) (Oral)   Resp 18   Ht 6\' 1"  (1.854 m)   Wt 81.6 kg   SpO2 96%   BMI 23.75 kg/m  Physical Exam Vitals and nursing note reviewed.  Constitutional:      General: He is not in acute distress.    Appearance: He is well-developed. He is not ill-appearing, toxic-appearing or diaphoretic.  HENT:     Head: Normocephalic and atraumatic.  Eyes:     Conjunctiva/sclera: Conjunctivae normal.     Pupils: Pupils are equal, round, and reactive to light.  Cardiovascular:     Rate and Rhythm: Normal rate and regular rhythm.     Heart sounds: No murmur heard. Pulmonary:     Effort: Pulmonary effort is normal. No tachypnea or respiratory distress.     Breath sounds: Normal breath sounds. No decreased breath sounds, wheezing, rhonchi or rales.  Chest:     Chest wall: No tenderness.  Abdominal:     Palpations: Abdomen is soft.     Tenderness: There is no abdominal tenderness.  Musculoskeletal:        General: No swelling.     Cervical back: Neck supple.     Right lower leg: No tenderness. No edema.     Left lower leg: No tenderness. No edema.  Skin:    General: Skin is warm and dry.     Capillary Refill: Capillary refill takes less than 2 seconds.     Findings: No erythema.  Neurological:     General: No focal deficit present.     Mental Status: He is alert.  Psychiatric:        Mood and Affect: Mood normal.     ED Results / Procedures / Treatments   Labs (all labs ordered are listed, but only abnormal results are displayed) Labs Reviewed  BASIC METABOLIC PANEL - Abnormal; Notable for the following  components:      Result Value   Glucose, Bld 114 (*)    BUN 29 (*)    Creatinine, Ser 1.93 (*)    Calcium 8.8 (*)    GFR, Estimated 35 (*)    All other components within normal limits  CBC - Abnormal; Notable for the following components:   Hemoglobin 12.9 (*)    HCT 38.4 (*)    All other components within normal limits  HEPATIC FUNCTION PANEL - Abnormal; Notable for the following components:   Total Protein 6.1 (*)    Albumin 3.3 (*)    All other components within normal limits  BRAIN NATRIURETIC PEPTIDE - Abnormal;  Notable for the following components:   B Natriuretic Peptide 149.6 (*)    All other components within normal limits  RESP PANEL BY RT-PCR (RSV, FLU A&B, COVID)  [id number]  LIPASE, BLOOD  TSH  TROPONIN I (HIGH SENSITIVITY)  TROPONIN I (HIGH SENSITIVITY)    EKG EKG Interpretation  Date/Time:  Sunday February 04 2023 18:26:40 EDT Ventricular Rate:  60 PR Interval:  164 QRS Duration: 97 QT Interval:  416 QTC Calculation: 416 R Axis:   69 Text Interpretation: Sinus rhythm Abnormal R-wave progression, early transition when compared to prior, similar appearance. No STEMI Confirmed by Antony Blackbird [phone]) on 02/04/2023 6:44:29 PM  Radiology CT Soft Tissue Neck W Contrast  Result Date: 02/04/2023 CLINICAL DATA:  Right neck mass EXAM: CT NECK WITH CONTRAST TECHNIQUE: Multidetector CT imaging of the neck was performed using the standard protocol following the bolus administration of intravenous contrast. RADIATION DOSE REDUCTION: This exam was performed according to the departmental dose-optimization program which includes automated exposure control, adjustment of the mA and/or kV according to patient size and/or use of iterative reconstruction technique. CONTRAST:  90mL OMNIPAQUE IOHEXOL 350 MG/ML SOLN COMPARISON:  None Available. FINDINGS: Pharynx and larynx: Normal. No mass or swelling. Salivary glands: No inflammation, mass, or stone. Thyroid: Normal. Lymph nodes: None  enlarged or abnormal density. Vascular: Negative. Limited intracranial: Negative. Visualized orbits: Negative. Mastoids and visualized paranasal sinuses: Clear. Skeleton: No acute or aggressive process. Upper chest: Negative. Other: None. IMPRESSION: No mass or lymphadenopathy of the neck. Electronically Signed   By: Ulyses Jarred M.D.   On: 02/04/2023 20:07   CT Angio Chest PE W and/or Wo Contrast  Result Date: 02/04/2023 CLINICAL DATA:  Sean of breath, chest pain EXAM: CT ANGIOGRAPHY CHEST WITH CONTRAST TECHNIQUE: Multidetector CT imaging of the chest was performed using the standard protocol during bolus administration of intravenous contrast. Multiplanar CT image reconstructions and MIPs were obtained to evaluate the vascular anatomy. RADIATION DOSE REDUCTION: This exam was performed according to the departmental dose-optimization program which includes automated exposure control, adjustment of the mA and/or kV according to patient size and/or use of iterative reconstruction technique. CONTRAST:  53mL OMNIPAQUE IOHEXOL 350 MG/ML SOLN COMPARISON:  05/25/2017 FINDINGS: Cardiovascular: No filling defects in the pulmonary arteries to suggest pulmonary emboli. Heart is normal size. Aorta is normal caliber. Diffuse coronary artery and moderate aortic calcifications. Mediastinum/Nodes: Scattered small and borderline sized mediastinal lymph nodes, stable since prior study, likely reactive. No mediastinal, hilar, or axillary adenopathy. Trachea and esophagus are unremarkable. Thyroid unremarkable. Lungs/Pleura: Biapical scarring. No confluent opacities or effusions. Upper Abdomen: Prior right nephrectomy.  No acute findings. Musculoskeletal: Chest wall soft tissues are unremarkable. No acute bony abnormality. Review of the MIP images confirms the above findings. IMPRESSION: No evidence of pulmonary embolus. Diffuse coronary artery disease. No acute cardiopulmonary disease. Aortic Atherosclerosis (ICD10-I70.0).  Electronically Signed   By: Rolm Baptise M.D.   On: 02/04/2023 19:26   DG Chest 2 View  Result Date: 02/04/2023 CLINICAL DATA:  Sean of breath EXAM: CHEST - 2 VIEW COMPARISON:  12/20/2022 FINDINGS: The heart size and mediastinal contours are within normal limits. Both lungs are clear. Multilevel degenerative change of the spine. IMPRESSION: No active cardiopulmonary disease. Electronically Signed   By: Donavan Foil M.D.   On: 02/04/2023 17:45    Procedures Procedures    Medications Ordered in ED Medications  iohexol (OMNIPAQUE) 350 MG/ML injection 70 mL (70 mLs Intravenous Contrast Given 02/04/23 1837)    ED Course/  Medical Decision Making/ A&P                             Medical Decision Making Amount and/or Complexity of Data Reviewed Labs: ordered. Radiology: ordered. ECG/medicine tests: ordered.  Risk Prescription drug management.    Sean Hammond is a 82 y.o. male with a past medical history significant for CAD with previous PCI, CKD, previous melanoma, kidney stones, and GERD who presents with fatigue, exertional Sean of breath, chest discomfort, and neck swelling.  According to patient, for the last week or 2 he has had worsening fatigue and some mild exertional Sean of breath.  He reports has been worsening for last few days and feels worse than when he had his previous MI.  He reports for the last day or 2 he has had intermittent discomfort in his left chest going down his left arm that comes and goes.  It does not seem to be pleuritic or exertional.  He noticed last night that he had a swelling and hard area on his right neck that felt like a marble was in one of the vessels.  He reports it happened twice where it was there and then felt like it went downward and went away.  He has no history of DVT or PE, and has no history of SVT syndrome.  No history of neck injury.  Is not having pain there.  Denies difficulty with breathing or swallowing initially.  Denies  fevers, chills, congestion, or cough.  Denies any diaphoresis but does have some intermittent nausea.  No vomiting.  Denies any GI or GU symptoms otherwise.  On exam, lungs were clear.  Chest was nontender.  Abdomen nontender.  Denies any leg swelling or leg pain and he did not have significant edema in his legs.  Good pulses in extremities.  Neck was nontender and I cannot appreciate the mass he was describing.  He reports it did not feel like a lymph node.  Oropharyngeal exam otherwise unremarkable.  Patient well-appearing with reassuring vital signs.  EKG does not show STEMI.  Given the patient's history of multiple MI with PCI, I am somewhat concerned about his exertional Sean of breath as well as the intermittent chest discomfort going down his left arm with lightheadedness and Sean of breath.  I do feel we need to get cardiac workup including a BNP with this worsening exertional Sean of breath and fatigue.  This discomfort and swelling in his neck that felt like "a marble and a pipe in my neck that went downward" is concerning for a neck DVT or SVC syndrome.  Will get CT PE study of the chest but will also get a CT soft tissue of the neck.  When workup is completed, anticipate discussion with cardiology about a plan given this concerning chest discomfort and his history.  10:35 PM Workup returned overall reassuring although BNP is slightly elevated at 149.  Viral testing negative.  CBC and metabolic panel did show elevated creatinine but he has had this in the past.  No evidence of LFT elevation or lipase elevation.  CT of the chest and neck did not show concerning abnormalities and I discussed these findings with patient.  Due to his history and discomfort and short of breath, I did call cardiology.  I spoke to Dr. Chancy Milroy who reviewed the case.  They feel the patient does not need to be admitted given over 6 hours of  stability without symptoms here and his reassuring workup.  They do  feel he is to call his cardiology team tomorrow to discuss outpatient stress and evaluation.  Family agrees with this plan and will be discharged with understanding of return precautions and follow-up instructions.        Final Clinical Impression(s) / ED Diagnoses Final diagnoses:  Exertional Sean of breath  Chest discomfort  Chest pain, unspecified type    Rx / DC Orders ED Discharge Orders          Ordered    Ambulatory referral to Cardiology        02/04/23 2237            Clinical Impression: 1. Exertional Sean of breath   2. Chest discomfort   3. Chest pain, unspecified type     Disposition: Discharge  Condition: Good  I have discussed the results, Dx and Tx plan with the pt(& family if present). He/she/they expressed understanding and agree(s) with the plan. Discharge instructions discussed at great length. Strict return precautions discussed and pt &/or family have verbalized understanding of the instructions. No further questions at time of discharge.    New Prescriptions   No medications on file    Follow Up: your cardiologist         Amariya Liskey, Gwenyth Allegra, MD 02/04/23 2238

## 2023-02-04 NOTE — ED Notes (Signed)
Discharge paperwork reviewed entirely with patient, including Rx's and follow up care. Pain was under control. Pt verbalized understanding as well as all parties involved. No questions or concerns voiced at the time of discharge. No acute distress noted.   Pt ambulated out to PVA without incident or assistance.  

## 2023-02-04 NOTE — ED Notes (Signed)
Patient transported to CT 

## 2023-02-04 NOTE — ED Notes (Signed)
Patient states last night and the night before,  he felt like there was a "marble" flowing through his carotid artery. Was slightly SOB and light headed.

## 2023-02-04 NOTE — ED Triage Notes (Signed)
Pt arrives pov, steady gait with c/o shob x 1 week, dizziness and fatigue  x 2 weeks. Pt also reports CP radiating to LT arm with nausea intermittently, worse last night  Pt speaking in complete sentences

## 2023-02-04 NOTE — Discharge Instructions (Signed)
Your history, exam, workup today showed negative heart enzymes and otherwise reassuring workup.  Your BNP was slightly elevated however the rest of your workup appeared similar to prior.  I spoke to cardiology given your story and symptoms and history and they do feel you are safe for discharge tonight but do want you to call cardiology tomorrow to get seen a soon as possible for outpatient follow-up and workup.  Please rest and stay hydrated.  If any symptoms change acutely, please return to the nearest emergency department, ideally going to [facility] emergency department where they have cardiology at all times.

## 2023-02-05 NOTE — Progress Notes (Deleted)
Cardiology Clinic Note   Patient Name: Sean Hammond Date of Encounter: 02/05/2023  Primary Care Provider:  Serita Grammes, MD Primary Cardiologist:  Candee Furbish, MD  Patient Profile    ***  Past Medical History    Past Medical History:  Diagnosis Date   Allergic rhinitis    BPH (benign prostatic hyperplasia)    urologist-- dr t. [name]   CAD (coronary artery disease) cardiologsit--- dr [name]   a. 12-14-2011 s/p PCI with DES x2 to midLCFx. b. NSTEMI 06/2020 s/p DES to RCA and staged DES to OM1.   Chronic headaches    due to sinues   ED (erectile dysfunction)    GERD (gastroesophageal reflux disease)    Hematuria    History of acute pyelonephritis    08-11-2019   History of echocardiogram    last one 03-22-2015   mild LVH,   ef 55-60%   History of kidney stones    History of melanoma excision    non-malignant excised from ear   History of MI (myocardial infarction)    per pt remote hx yrs ago prior to 2013   History of nonmelanoma skin cancer    per pt SCC and BCC from back, head, face  excision   History of prostatitis    Hydronephrosis, right    Hyperlipidemia    Hypertension    Moderate obstructive sleep apnea    not used cpap since 2014 bought bed that head would elevate    Osteoarthritis of hand    PONV (postoperative nausea and vomiting)    Pt may have been confused after surgery in the past   Statin intolerance    Wears glasses    Past Surgical History:  Procedure Laterality Date   BONE CYST EXCISION  ~ 2004   left knee   CARDIAC CATHETERIZATION N/A 05/09/2016   Procedure: Left Heart Cath and Coronary Angiography;  Surgeon: Jerline Pain, MD;  Location: [facility];  Service: Cardiovascular;  Laterality: N/A;   CHOLECYSTECTOMY N/A 03/23/2015   Procedure: LAPAROSCOPIC CHOLECYSTECTOMY;  Surgeon: Ralene Ok, MD;  Location: [facility];  Service: General;  Laterality: N/A;   CORONARY ANGIOPLASTY WITH STENT PLACEMENT  12/14/11   "2"   CORONARY  STENT INTERVENTION N/A 07/01/2020   Procedure: CORONARY STENT INTERVENTION;  Surgeon: Belva Crome, MD;  Location: [facility];  Service: Cardiovascular;  Laterality: N/A;   CORONARY STENT INTERVENTION N/A 07/05/2020   Procedure: CORONARY STENT INTERVENTION;  Surgeon: Belva Crome, MD;  Location: [facility];  Service: Cardiovascular;  Laterality: N/A;   CYSTOSCOPY/RETROGRADE/URETEROSCOPY Bilateral 09/05/2019   Procedure: CYSTOSCOPY/ BILATERAL RETROGRADE/ RIGHT  URETEROSCOPY with biopsy with laser ablation of tumor;  Surgeon: Alexis Frock, MD;  Location: [facility];  Service: Urology;  Laterality: Bilateral;   ETHMOIDECTOMY Right 09/01/2019   Procedure: RIGHT REVISION ENDOSCOPIC ETHMOIDECTOMY;  Surgeon: Izora Gala, MD;  Location: [facility];  Service: ENT;  Laterality: Right;   FRONTAL SINUSOTOMY Bilateral 07-23-2003   dr Constance Holster   re-do   HOLMIUM LASER APPLICATION Right [id number]   Procedure: HOLMIUM LASER APPLICATION;  Surgeon: Alexis Frock, MD;  Location: [facility];  Service: Urology;  Laterality: Right;   KNEE ARTHROSCOPY Left ` 2010   LEFT HEART CATH AND CORONARY ANGIOGRAPHY N/A 07/01/2020   Procedure: LEFT HEART CATH AND CORONARY ANGIOGRAPHY;  Surgeon: Belva Crome, MD;  Location: [facility];  Service: Cardiovascular;  Laterality: N/A;  LEFT HEART CATHETERIZATION WITH CORONARY ANGIOGRAM N/A 12/14/2011   Procedure: LEFT HEART CATHETERIZATION WITH CORONARY ANGIOGRAM;  Surgeon: Candee Furbish, MD;  Location: [facility];  Service: Cardiovascular;  Laterality: N/A;   PERCUTANEOUS CORONARY STENT INTERVENTION (PCI-S)  12/14/2011   Procedure: PERCUTANEOUS CORONARY STENT INTERVENTION (PCI-S);  Surgeon: Candee Furbish, MD;  Location: [facility] CATH LAB;  Service: Cardiovascular;;   ROBOT ASSITED LAPAROSCOPIC NEPHROURETERECTOMY Right 11/28/2019   Procedure: XI ROBOT ASSITED LAPAROSCOPIC NEPHROURETERECTOMY;  Surgeon: Alexis Frock, MD;   Location: [facility];  Service: Urology;  Laterality: Right;  3 HRS   SEPTOPLASTY WITH ETHMOIDECTOMY, AND MAXILLARY ANTROSTOMY  09-22-2002   dr Constance Holster   and sinusotomy   TOTAL KNEE ARTHROPLASTY Left 05/09/2017   Procedure: LEFT TOTAL KNEE ARTHROPLASTY;  Surgeon: Latanya Maudlin, MD;  Location: [facility];  Service: Orthopedics;  Laterality: Left;    Allergies  Allergies  Allergen Reactions   Beef-Derived Products Anaphylaxis, Hives, Shortness Of Breath and Rash    Because of a tick bite, the patient now has "Alpha-gal allergy"   Pork-Derived Products Anaphylaxis, Hives, Shortness Of Breath and Rash    Because of a tick bite, the patient now has "Alpha-gal allergy"   Codeine Other (See Comments)    Surgeon advised against this   Crestor [Rosuvastatin] Other (See Comments)    Muscle aches   Dilaudid [Hydromorphone Hcl] Hives    All-over hives   Hydrocodone Itching   Lipitor [Atorvastatin] Other (See Comments)    Muscle aches    Percocet [Oxycodone-Acetaminophen] Itching   Pitavastatin Other (See Comments)    Leg pain    History of Present Illness    ***  Home Medications    Prior to Admission medications   Medication Sig Start Date End Date Taking? Authorizing Provider  Ascorbic Acid (VITAMIN C PO) Take 1 tablet by mouth daily.     [provider]  Azelastine HCl 137 MCG/SPRAY SOLN Place 2 sprays into both nostrils 2 (two) times daily. 07/18/21   [provider]  clopidogrel (PLAVIX) 75 MG tablet Take 1 tablet (75 mg total) by mouth daily. 10/10/22   Jerline Pain, MD  diphenhydrAMINE (BENADRYL) 25 MG tablet Take 25 mg by mouth every 6 (six) hours as needed for allergies.     [provider]  EPIPEN 2-PAK 0.3 MG/0.3ML SOAJ injection Inject 0.3 mg into the muscle once as needed (for an anaphylactic reaction).  03/17/14   [provider]  Evolocumab (REPATHA SURECLICK) [id number] MG/ML SOAJ Inject 140 mg into the skin every 14 (fourteen) days. 12/21/22   Jerline Pain, MD  ipratropium (ATROVENT) 0.06 % nasal spray USE 1-2 SPRAYS IN EACH NOSTRIL EVERY 6 HOURS IF NEEDED TO DRY UP NOSE. 01/24/21   Kozlow, Donnamarie Poag, MD  isosorbide mononitrate (IMDUR) 30 MG 24 hr tablet Take 1 tablet (30 mg total) by mouth daily. 09/27/22   Jerline Pain, MD  loratadine (CLARITIN) 10 MG tablet Take 10 mg by mouth daily. 04/27/21   [provider]  metoprolol succinate (TOPROL-XL) 25 MG 24 hr tablet Take 1 tablet (25 mg total) by mouth daily. 11/14/22   Jerline Pain, MD  Multiple Vitamin (MULTIVITAMIN PO) Take 1 tablet by mouth daily.     [provider]  nitroGLYCERIN (NITROSTAT) 0.4 MG SL tablet PLACE 1 TABLET UNDER TONGUE EVERY 5 MINUTES AS NEEDED FOR CHEST PAIN MAX 3 TABS/15 MIN 10/18/21   Jerline Pain, MD  pantoprazole (PROTONIX) 40 MG tablet TAKE 1  TABLET (40 MG TOTAL) BY MOUTH DAILY AFTER BREAKFAST. 08/28/22   Jerline Pain, MD  sodium fluoride (FLUORISHIELD) 1.1 % GEL dental gel See admin instructions. 07/18/21   [provider]  tamsulosin (FLOMAX) 0.4 MG CAPS capsule Take 1 capsule (0.4 mg total) by mouth daily. 05/09/17   Porterfield, Amber, PA-C  traMADol (ULTRAM) 50 MG tablet Take 50 mg by mouth every 6 (six) hours as needed (pain).  06/09/20   [provider]  valsartan (DIOVAN) 160 MG tablet Take 160 mg by mouth daily. 12/13/20   [provider]  VITAMIN D PO Take 1 tablet by mouth daily.     [provider]    Family History    Family History  Problem Relation Age of Onset   Heart attack Mother    Hypertension Mother    He indicated that his mother is deceased. He indicated that his father is deceased. He indicated that his maternal grandmother is deceased. He indicated that his maternal grandfather is deceased. He indicated that his paternal grandmother is deceased. He indicated that his paternal grandfather is deceased.  Social History    Social History   Socioeconomic History   Marital status: Married     Spouse name: Not on file   Number of children: Not on file   Years of education: Not on file   Highest education level: Not on file  Occupational History   Not on file  Tobacco Use   Smoking status: Former    Packs/day: 2.00    Years: 12.00    Additional pack years: 0.00    Total pack years: 24.00    Types: Cigarettes    Quit date: 09/03/1974    Years since quitting: 48.4   Smokeless tobacco: Never  Vaping Use   Vaping Use: Never used  Substance and Sexual Activity   Alcohol use: Not Currently   Drug use: Never   Sexual activity: Not on file  Other Topics Concern   Not on file  Social History Narrative   Not on file   Social Determinants of Health   Financial Resource Strain: Not on file  Food Insecurity: Not on file  Transportation Needs: Not on file  Physical Activity: Not on file  Stress: Not on file  Social Connections: Not on file  Intimate Partner Violence: Not on file     Review of Systems    General:  No chills, fever, night sweats or weight changes.  Cardiovascular:  No chest pain, dyspnea on exertion, edema, orthopnea, palpitations, paroxysmal nocturnal dyspnea. Dermatological: No rash, lesions/masses Respiratory: No cough, dyspnea Urologic: No hematuria, dysuria Abdominal:   No nausea, vomiting, diarrhea, bright red blood per rectum, melena, or hematemesis Neurologic:  No visual changes, wkns, changes in mental status. All other systems reviewed and are otherwise negative except as noted above.  Physical Exam    VS:  There were no vitals taken for this visit. , BMI There is no height or weight on file to calculate BMI. GEN: Well nourished, well developed, in no acute distress. HEENT: normal. Neck: Supple, no JVD, carotid bruits, or masses. Cardiac: RRR, no murmurs, rubs, or gallops. No clubbing, cyanosis, edema.  Radials/DP/PT 2+ and equal bilaterally.  Respiratory:  Respirations regular and unlabored, clear to auscultation bilaterally. GI:  Soft, nontender, nondistended, BS + x 4. MS: no deformity or atrophy. Skin: warm and dry, no rash. Neuro:  Strength and sensation are intact. Psych: Normal affect.  Accessory Clinical Findings  Recent Labs: 02/04/2023: ALT 11; B Natriuretic Peptide 149.6; BUN 29; Creatinine, Ser 1.93; Hemoglobin 12.9; Platelets 163; Potassium 4.3; Sodium 136; TSH 1.863   Recent Lipid Panel    Component Value Date/Time   CHOL 121 09/20/2020 0904   TRIG 89 09/20/2020 0904   HDL 42 09/20/2020 0904   CHOLHDL 2.9 09/20/2020 0904   CHOLHDL 3.6 07/01/2020 0301   VLDL 10 07/01/2020 0301   LDLCALC 62 09/20/2020 0904         ECG personally reviewed by me today- *** - No acute changes  Assessment & Plan   1.  ***   Jossie Ng. Kristjan Derner NP-C     02/05/2023, 4:15 PM [facility] [address] Office [phone] Fax [phone]    I spent***minutes examining this patient, reviewing medications, and using patient centered shared decision making involving her cardiac care.  Prior to her visit I spent greater than 20 minutes reviewing her past medical history,  medications, and prior cardiac tests.

## 2023-02-06 ENCOUNTER — Ambulatory Visit: Payer: Medicare Other | Admitting: General Practice

## 2023-02-06 NOTE — Progress Notes (Unsigned)
Cardiology Office Note:    Date:  02/08/2023   ID:  Sean Hammond, DOB Sep 22, 1941, MRN [id number]  PCP:  Serita Grammes, MD   [facility] Providers Cardiologist:  Candee Furbish, MD     Referring MD: Serita Grammes, MD   CC: DOD CP  History of Present Illness:    Sean Hammond is a 82 y.o. male with a hx of CAD s/p PCI LCX in 2013, then RCA and OM1 with R to L collaterals and residual marginal disease complicated with statin intolerance who presents as a DOD visit for chest pain.  Patient notes that he is doing poorly.   Since last visit notes that he has had new chest pain.  His anginal equivalent is DOE and SOB.    No chest pain or pressure was bad enough he went to the ED on Sunday.  No evidence of NSTEMI. Since this winter he has new SOB: he cannot hunt rabbits.  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 No weight gain or leg swelling.  No palpitations or syncope. He has had new dizziness. His kidney function is always a bit high because he has one kidney. During ED visit Creatinine was 1.9. His BP is normal normal or high. He cannot tolerate the IMDUR at current dose due to HA but he is taking it to stave off resting SOB. He does not take the nitro because of similar symptoms.    Past Medical History:  Diagnosis Date   Allergic rhinitis    BPH (benign prostatic hyperplasia)    urologist-- dr t. [name]   CAD (coronary artery disease) cardiologsit--- dr [name]   a. 12-14-2011 s/p PCI with DES x2 to midLCFx. b. NSTEMI 06/2020 s/p DES to RCA and staged DES to OM1.   Chronic headaches    due to sinues   ED (erectile dysfunction)    GERD (gastroesophageal reflux disease)    Hematuria    History of acute pyelonephritis    08-11-2019   History of echocardiogram    last one 03-22-2015   mild LVH,   ef 55-60%   History of kidney stones    History of melanoma excision    non-malignant excised from ear   History of MI (myocardial infarction)    per pt remote hx yrs ago  prior to 2013   History of nonmelanoma skin cancer    per pt SCC and BCC from back, head, face  excision   History of prostatitis    Hydronephrosis, right    Hyperlipidemia    Hypertension    Moderate obstructive sleep apnea    not used cpap since 2014 bought bed that head would elevate    Osteoarthritis of hand    PONV (postoperative nausea and vomiting)    Pt may have been confused after surgery in the past   Statin intolerance    Wears glasses     Past Surgical History:  Procedure Laterality Date   BONE CYST EXCISION  ~ 2004   left knee   CARDIAC CATHETERIZATION N/A 05/09/2016   Procedure: Left Heart Cath and Coronary Angiography;  Surgeon: Jerline Pain, MD;  Location: [facility];  Service: Cardiovascular;  Laterality: N/A;   CHOLECYSTECTOMY N/A 03/23/2015   Procedure: LAPAROSCOPIC CHOLECYSTECTOMY;  Surgeon: Ralene Ok, MD;  Location: [facility];  Service: General;  Laterality: N/A;   CORONARY ANGIOPLASTY WITH STENT PLACEMENT  12/14/11   "2"   CORONARY STENT INTERVENTION N/A 07/01/2020  Procedure: CORONARY STENT INTERVENTION;  Surgeon: Belva Crome, MD;  Location: [facility];  Service: Cardiovascular;  Laterality: N/A;   CORONARY STENT INTERVENTION N/A 07/05/2020   Procedure: CORONARY STENT INTERVENTION;  Surgeon: Belva Crome, MD;  Location: [facility];  Service: Cardiovascular;  Laterality: N/A;   CYSTOSCOPY/RETROGRADE/URETEROSCOPY Bilateral 09/05/2019   Procedure: CYSTOSCOPY/ BILATERAL RETROGRADE/ RIGHT  URETEROSCOPY with biopsy with laser ablation of tumor;  Surgeon: Alexis Frock, MD;  Location: [facility];  Service: Urology;  Laterality: Bilateral;   ETHMOIDECTOMY Right 09/01/2019   Procedure: RIGHT REVISION ENDOSCOPIC ETHMOIDECTOMY;  Surgeon: Izora Gala, MD;  Location: [place] [facility];  Service: ENT;  Laterality: Right;   FRONTAL SINUSOTOMY Bilateral 07-23-2003   dr Constance Holster   re-do   HOLMIUM LASER APPLICATION Right  [id number]   Procedure: HOLMIUM LASER APPLICATION;  Surgeon: Alexis Frock, MD;  Location: [facility];  Service: Urology;  Laterality: Right;   KNEE ARTHROSCOPY Left ` 2010   LEFT HEART CATH AND CORONARY ANGIOGRAPHY N/A 07/01/2020   Procedure: LEFT HEART CATH AND CORONARY ANGIOGRAPHY;  Surgeon: Belva Crome, MD;  Location: [facility];  Service: Cardiovascular;  Laterality: N/A;   LEFT HEART CATHETERIZATION WITH CORONARY ANGIOGRAM N/A 12/14/2011   Procedure: LEFT HEART CATHETERIZATION WITH CORONARY ANGIOGRAM;  Surgeon: Candee Furbish, MD;  Location: [facility];  Service: Cardiovascular;  Laterality: N/A;   PERCUTANEOUS CORONARY STENT INTERVENTION (PCI-S)  12/14/2011   Procedure: PERCUTANEOUS CORONARY STENT INTERVENTION (PCI-S);  Surgeon: Candee Furbish, MD;  Location: [facility];  Service: Cardiovascular;;   ROBOT ASSITED LAPAROSCOPIC NEPHROURETERECTOMY Right 11/28/2019   Procedure: XI ROBOT ASSITED LAPAROSCOPIC NEPHROURETERECTOMY;  Surgeon: Alexis Frock, MD;  Location: [facility];  Service: Urology;  Laterality: Right;  3 HRS   SEPTOPLASTY WITH ETHMOIDECTOMY, AND MAXILLARY ANTROSTOMY  09-22-2002   dr Constance Holster   and sinusotomy   TOTAL KNEE ARTHROPLASTY Left 05/09/2017   Procedure: LEFT TOTAL KNEE ARTHROPLASTY;  Surgeon: Latanya Maudlin, MD;  Location: [facility];  Service: Orthopedics;  Laterality: Left;    Current Medications: Current Meds  Medication Sig   albuterol (VENTOLIN HFA) 108 (90 Base) MCG/ACT inhaler Inhale 2 puffs into the lungs as needed for shortness of breath or wheezing.   allopurinol (ZYLOPRIM) 100 MG tablet Take 1 tablet by mouth as needed (gout).   Ascorbic Acid (VITAMIN C PO) Take 1 tablet by mouth daily.    Azelastine HCl 137 MCG/SPRAY SOLN Place 2 sprays into both nostrils 2 (two) times daily.   clopidogrel (PLAVIX) 75 MG tablet Take 1 tablet (75 mg total) by mouth daily.   dicyclomine (BENTYL) 10 MG capsule Take 10 mg by mouth every 6 (six) hours as needed  for spasms.   diphenhydrAMINE (BENADRYL) 25 MG tablet Take 25 mg by mouth every 6 (six) hours as needed for allergies.    EPIPEN 2-PAK 0.3 MG/0.3ML SOAJ injection Inject 0.3 mg into the muscle once as needed (for an anaphylactic reaction).    Evolocumab (REPATHA SURECLICK) [id number] MG/ML SOAJ Inject 140 mg into the skin every 14 (fourteen) days.   ipratropium (ATROVENT) 0.06 % nasal spray USE 1-2 SPRAYS IN EACH NOSTRIL EVERY 6 HOURS IF NEEDED TO DRY UP NOSE.   isosorbide mononitrate (IMDUR) 30 MG 24 hr tablet Take 1 tablet (30 mg total) by mouth daily.   loratadine (CLARITIN) 10 MG tablet Take 10 mg by mouth daily.   meclizine (ANTIVERT) 25 MG tablet Take 25 mg by mouth 2 (  two) times daily as needed for dizziness.   metoprolol succinate (TOPROL-XL) 25 MG 24 hr tablet Take 1 tablet (25 mg total) by mouth daily.   Multiple Vitamin (MULTIVITAMIN PO) Take 1 tablet by mouth daily.    nitroGLYCERIN (NITROSTAT) 0.4 MG SL tablet PLACE 1 TABLET UNDER TONGUE EVERY 5 MINUTES AS NEEDED FOR CHEST PAIN MAX 3 TABS/15 MIN   pantoprazole (PROTONIX) 40 MG tablet TAKE 1 TABLET (40 MG TOTAL) BY MOUTH DAILY AFTER BREAKFAST.   sodium fluoride (FLUORISHIELD) 1.1 % GEL dental gel See admin instructions.   tamsulosin (FLOMAX) 0.4 MG CAPS capsule Take 1 capsule (0.4 mg total) by mouth daily.   traMADol (ULTRAM) 50 MG tablet Take 50 mg by mouth every 6 (six) hours as needed (pain).    VITAMIN D PO Take 1 tablet by mouth daily.    [DISCONTINUED] valsartan (DIOVAN) 160 MG tablet Take 160 mg by mouth daily.     Allergies:   Beef-derived products, Pork-derived products, Codeine, Crestor [rosuvastatin], Dilaudid [hydromorphone hcl], Hydrocodone, Lipitor [atorvastatin], Percocet [oxycodone-acetaminophen], and Pitavastatin   Social History   Socioeconomic History   Marital status: Married    Spouse name: Not on file   Number of children: Not on file   Years of education: Not on file   Highest education level: Not on file   Occupational History   Not on file  Tobacco Use   Smoking status: Former    Packs/day: 2.00    Years: 12.00    Additional pack years: 0.00    Total pack years: 24.00    Types: Cigarettes    Quit date: 09/03/1974    Years since quitting: 48.4   Smokeless tobacco: Never  Vaping Use   Vaping Use: Never used  Substance and Sexual Activity   Alcohol use: Not Currently   Drug use: Never   Sexual activity: Not on file  Other Topics Concern   Not on file  Social History Narrative   Not on file   Social Determinants of Health   Financial Resource Strain: Not on file  Food Insecurity: Not on file  Transportation Needs: Not on file  Physical Activity: Not on file  Stress: Not on file  Social Connections: Not on file     Family History: The patient's family history includes Heart attack in his mother; Hypertension in his mother.  ROS:   Please see the history of present illness.     EKGs/Labs/Other Studies Reviewed:    The following studies were reviewed today:   EKG:   02/04/2023: SR rate 60  Cardiac Studies & Procedures   CARDIAC CATHETERIZATION  CARDIAC CATHETERIZATION 07/06/2020  Narrative  A stent was successfully placed.   First obtuse marginal 85% mid stenosis reduced to 0% with TIMI grade III flow using a 2.25 x 12 Onyx deployed at 12 atm.  RECOMMENDATIONS:  Aspirin and Plavix x12 months.  Then consider decreasing to Plavix monotherapy thereafter.  Plan discharge later today.  Findings Coronary Findings Diagnostic  Dominance: Right  Left Anterior Descending Prox LAD to Mid LAD lesion is 30% stenosed. Mid LAD-1 lesion is 60% stenosed. Mid LAD-2 lesion is 70% stenosed.  First Diagonal Branch Vessel is small in size.  Second Diagonal Branch 2nd Diag lesion is 30% stenosed.  Left Circumflex Ost Cx to Prox Cx lesion is 70% stenosed. Prox Cx to Dist Cx lesion is 20% stenosed. The lesion was previously treated.  First Obtuse Marginal  Branch 1st Mrg-1 lesion is 40% stenosed. 1st Mrg-2  lesion is 85% stenosed.  Third Obtuse Marginal Branch Vessel is small in size.  Intervention  1st Mrg-2 lesion Stent A stent was successfully placed. Post-Intervention Lesion Assessment The intervention was successful. There is a 0% residual stenosis post intervention.   CARDIAC CATHETERIZATION 07/01/2020  Narrative  Subtotal occlusion with greater than 95% stenosis from proximal to mid RCA.  Week left-to-right collaterals are noted.  Successful stenting of the right coronary with reduction in stenosis to 0% and TIMI grade III flow using a 30 x 2.75 mm Onyx postdilated to 3.0 mm in diameter.  Left main is widely patent  LAD contains moderate to moderately severe mid atherosclerosis.  No high-grade obstruction is noted.  Circumflex contains mid vessel stents.  The stents started just below the first obtuse marginal.  The first marginal contains a proximal 85% stenosis.  The circumflex proximal to the origin of the first obtuse marginal contains eccentric 70 to 80% stenosis.  The obtuse marginal and circumflex will need percutaneous intervention.  Should be staged he electively performed after 72 hours post contrast exposure on this procedure.  Inferior wall hypokinesis.  EF is 50%.  RECOMMENDATIONS:  Aspirin and Plavix for least 12 months and eventually consider Plavix monotherapy.  If no recurrent angina with ambulation tomorrow, will be okay to discharge the patient and have him return next week for PCI on circumflex and obtuse marginal.  If he has angina with physical activity prior to discharge, he should be kept in house over the weekend to undergo staged PCI on Monday.  If he has elective PCI next week it will be performed by me later in the week.  If he stays over the weekend, he will need to be put on the schedule of one of the interventional cardiology team on Monday.  Aggressive risk factor  modification.  Findings Coronary Findings Diagnostic  Dominance: Right  Left Anterior Descending Prox LAD to Mid LAD lesion is 30% stenosed. Mid LAD-1 lesion is 60% stenosed. Mid LAD-2 lesion is 70% stenosed.  First Diagonal Branch Vessel is small in size.  Second Diagonal Branch 2nd Diag lesion is 30% stenosed.  Left Circumflex Prox Cx lesion is 75% stenosed. Prox Cx to Dist Cx lesion is 20% stenosed. The lesion was previously treated.  First Obtuse Marginal Branch 1st Mrg lesion is 90% stenosed.  Third Obtuse Marginal Branch Vessel is small in size.  Right Coronary Artery Prox RCA-1 lesion is 99% stenosed. Prox RCA-2 lesion is 75% stenosed.  Right Posterior Descending Artery Collaterals RPDA filled by collaterals from 2nd Sept.  Intervention  Prox RCA-1 lesion Stent (Also treats lesions: Prox RCA-2) A stent was successfully placed. Post-Intervention Lesion Assessment The intervention was successful. There is a 0% residual stenosis post intervention.  Prox RCA-2 lesion Stent (Also treats lesions: Prox RCA-1) See details in Prox RCA-1 lesion. Post-Intervention Lesion Assessment The intervention was successful. There is a 0% residual stenosis post intervention.     ECHOCARDIOGRAM  ECHOCARDIOGRAM COMPLETE 03/22/2015  Narrative *[address] [employer]. [place], [place] [address] [phone]  ------------------------------------------------------------------- Transthoracic Echocardiography  Patient:    Guner, Avalo MR #:       [id number] Study Date: 03/22/2015 Gender:     M Age:        76 Height:     185.4 cm Weight:     85.3 kg BSA:        2.1 m^2 Pt. Status: Room:       1435  SONOGRAPHER  Tresa Res, RDCS ADMITTING    Verlee Monte [id number] Gladis Riffle, [place] Frederico Hamman, [place] PERFORMING   [facility],  Inpatient  cc:  ------------------------------------------------------------------- LV EF: 55% -   60%  ------------------------------------------------------------------- Indications:      Syncope 780.2.  ------------------------------------------------------------------- History:   PMH:   Coronary artery disease.  PMH:   Myocardial infarction.  ------------------------------------------------------------------- Study Conclusions  - Left ventricle: The cavity size was normal. Wall thickness was increased in a pattern of mild LVH. Systolic function was normal. The estimated ejection fraction was in the range of 55% to 60%. Wall motion was normal; there were no regional wall motion abnormalities. Left ventricular diastolic function parameters were normal. - Pulmonary arteries: PA peak pressure: 33 mm Hg (S).  Transthoracic echocardiography.  M-mode, complete 2D, spectral Doppler, and color Doppler.  Birthdate:  Patient birthdate: 1941-05-29.  Age:  Patient is 82 yr old.  Sex:  Gender: male. BMI: 24.8 kg/m^2.  Blood pressure:     128/74  Patient status: Inpatient.  Study date:  Study date: 03/22/2015. Study time: 10:25 AM.  Location:  Bedside.  -------------------------------------------------------------------  ------------------------------------------------------------------- Left ventricle:  The cavity size was normal. Wall thickness was increased in a pattern of mild LVH. Systolic function was normal. The estimated ejection fraction was in the range of 55% to 60%. Wall motion was normal; there were no regional wall motion abnormalities. The transmitral flow pattern was normal. The deceleration time of the early transmitral flow velocity was normal. The pulmonary vein flow pattern was normal. The tissue Doppler parameters were normal. Left ventricular diastolic function parameters were normal.  ------------------------------------------------------------------- Aortic  valve:   Structurally normal valve.   Cusp separation was normal.  Doppler:  Transvalvular velocity was within the normal range. There was no stenosis. There was no regurgitation.  ------------------------------------------------------------------- Aorta:  The aorta was normal, not dilated, and non-diseased.  ------------------------------------------------------------------- Mitral valve:   Structurally normal valve.   Leaflet separation was normal.  Doppler:  Transvalvular velocity was within the normal range. There was no evidence for stenosis. There was no regurgitation.  ------------------------------------------------------------------- Left atrium:  The atrium was normal in size.  ------------------------------------------------------------------- Right ventricle:  The cavity size was normal. Wall thickness was normal. Systolic function was normal.  ------------------------------------------------------------------- Pulmonic valve:    The valve appears to be grossly normal. Doppler:  There was no significant regurgitation.  ------------------------------------------------------------------- Tricuspid valve:   Structurally normal valve.   Leaflet separation was normal.  Doppler:  Transvalvular velocity was within the normal range. There was mild regurgitation.  ------------------------------------------------------------------- Right atrium:  The atrium was normal in size.  ------------------------------------------------------------------- Pericardium:  There was no pericardial effusion.  ------------------------------------------------------------------- Post procedure conclusions Ascending Aorta:  - The aorta was normal, not dilated, and non-diseased.  ------------------------------------------------------------------- Measurements  Left ventricle                         Value        Reference LV ID, ED, PLAX chordal                48.2  mm     43 - 52 LV ID,  ES, PLAX chordal                35.6  mm     23 - 38 LV fx shortening, PLAX chordal (L)     26    %      >=  29 LV PW thickness, ED                    9.37  mm     --------- IVS/LV PW ratio, ED                    1.24         <=1.3 LV e&', lateral                         7.07  cm/s   --------- LV E/e&', lateral                       8.09         --------- LV e&', medial                          6.85  cm/s   --------- LV E/e&', medial                        8.35         --------- LV e&', average                         6.96  cm/s   --------- LV E/e&', average                       8.22         ---------  Ventricular septum                     Value        Reference IVS thickness, ED                      11.6  mm     ---------  LVOT                                   Value        Reference LVOT ID, S                             21    mm     --------- LVOT area                              3.46  cm^2   ---------  Aorta                                  Value        Reference Aortic root ID, ED                     33    mm     ---------  Left atrium                            Value        Reference LA ID, A-P, ES  29    mm     --------- LA ID/bsa, A-P                         1.38  cm/m^2 <=2.2 LA volume, S                           45.4  ml     --------- LA volume/bsa, S                       21.6  ml/m^2 --------- LA volume, ES, 1-p A4C                 32.8  ml     --------- LA volume/bsa, ES, 1-p A4C             15.6  ml/m^2 --------- LA volume, ES, 1-p A2C                 56.3  ml     --------- LA volume/bsa, ES, 1-p A2C             26.8  ml/m^2 ---------  Mitral valve                           Value        Reference Mitral E-wave peak velocity            57.2  cm/s   --------- Mitral A-wave peak velocity            59.7  cm/s   --------- Mitral E/A ratio, peak                 1            ---------  Pulmonary arteries                     Value         Reference PA pressure, S, DP             (H)     33    mm Hg  <=30  Tricuspid valve                        Value        Reference Tricuspid regurg peak velocity         250   cm/s   --------- Tricuspid peak RV-RA gradient          25    mm Hg  ---------  Systemic veins                         Value        Reference Estimated CVP                          8     mm Hg  ---------  Right ventricle                        Value        Reference RV pressure, S, DP             (H)     33    mm Hg  <=30 RV s&', lateral, S  13.2  cm/s   ---------  Legend: (L)  and  (H)  mark values outside specified reference range.  ------------------------------------------------------------------- Prepared and Electronically Authenticated by  Darlin Coco 2016-05-02T18:16:27              Recent Labs: 02/04/2023: ALT 11; B Natriuretic Peptide 149.6; BUN 29; Creatinine, Ser 1.93; Hemoglobin 12.9; Platelets 163; Potassium 4.3; Sodium 136; TSH 1.863  Recent Lipid Panel    Component Value Date/Time   CHOL 121 09/20/2020 0904   TRIG 89 09/20/2020 0904   HDL 42 09/20/2020 0904   CHOLHDL 2.9 09/20/2020 0904   CHOLHDL 3.6 07/01/2020 0301   VLDL 10 07/01/2020 0301   LDLCALC 62 09/20/2020 0904     Physical Exam:    VS:  BP 104/68   Pulse 70   Ht 6\' 1"  (1.854 m)   Wt 181 lb (82.1 kg)   SpO2 97%   BMI 23.88 kg/m     Wt Readings from Last 3 Encounters:  02/08/23 181 lb (82.1 kg)  02/04/23 180 lb (81.6 kg)  09/27/22 187 lb 9.6 oz (85.1 kg)      HEENT: Normal NECK: No JVD CARDIAC: RRR, no murmurs, rubs, gallops RESPIRATORY:  Clear to auscultation without rales, wheezing or rhonchi  ABDOMEN: Soft, non-tender, non-distended MUSCULOSKELETAL:  No edema; No deformity  SKIN: Warm and dry NEUROLOGIC:  Alert and oriented x 3 PSYCHIATRIC:  Normal affect   ASSESSMENT:    1. Coronary artery disease involving native coronary artery of native heart with unstable angina pectoris  (HCC)   2. Chest pain, unspecified type   3. Moderate obstructive sleep apnea    PLAN:    Coronary Artery Disease; Obstructive With HTN and HLD and CKD  - symptomatic and not presently tolerating his medical therapy - anatomy: potentially with worsening LAD disease - continue plavix 75 mg - continue BB at 25 mm - continue IMDUR for now - holding his valsartan and checking labs today  Risks and benefits of cardiac catheterization have been discussed with the patient.  These include bleeding, infection, kidney damage, stroke, heart attack, death.  The patient understands these risks and is willing to proceed.  Discussed the alternatives (ranolazine and PET MPI) using SDM tool documented below.    Access recommendations: R radial Procedural considerations: Creatinine is pending today; he will need hydration and may need I-stat BMP  Post cath f/u with Dr. [name] team   Discussed Shared Decision Making Using the ISCHEMIA SDM and [place] Angina Questionnaire-7 PART I: Demographic and clinical information  Age 51 Sex Male Male Geographic region- [place]  Diabetes No Left ventricular ejection fraction (55%) 55 Serum creatinine (1.9) Number of anti-anginal medications: 2 Degree of ischemia:  Unknown Multivessel disease  PART II: [place] Angina Questionnaire-7  1. The following is a list of activities that people often do during the week. Although for some people with several medical problems it is difficult to determine what it is that limits them, please go over the activities listed below and indicate how much limitation you have had due to chest pain, chest tightness or angina over the past 4 weeks.  a. Walking indoors on level ground Moderately limited   b. Gardening, vacuuming or carrying groceries Quite a bit limited   c. Lifting or moving heavy objects (e.g. furniture, children) Quite a bit limited  2. Over the past 4 weeks, on average, how many times have  you had chest pain, chest tightness or angina? 4 or more  times per day  3. Over the past 4 weeks, on average, how many times have you had to take nitroglycerin (nitroglycerin tablets or spray) for your chest pain, chest tightness or angina? I have taken nitroglycerin... 4 or more times per day  4. Over the past 4 weeks, how much has your chest pain, chest tightness or angina limited your enjoyment of life? It has extremely limited my enjoyment of life  5. If you had to spend the rest of your life with your chest pain, chest tightness or angina the way it is right now, how would you feel about this? Mostly dissatisfied      Medication Adjustments/Labs and Tests Ordered: Current medicines are reviewed at length with the patient today.  Concerns regarding medicines are outlined above.  Orders Placed This Encounter  Procedures   CBC   Basic metabolic panel   No orders of the defined types were placed in this encounter.   Patient Instructions  Medication Instructions:  Your physician has recommended you make the following change in your medication:  STOP: Valsartan  *If you need a refill on your cardiac medications before your next appointment, please call your pharmacy*   Lab Work: TODAY: CBC, BMP  If you have labs (blood work) drawn today and your tests are completely normal, you will receive your results only by: [place] (if you have MyChart) OR A paper copy in the mail If you have any lab test that is abnormal or we need to change your treatment, we will call you to review the results.   Testing/Procedures: Your physician has requested that you have a cardiac catheterization. Cardiac catheterization is used to diagnose and/or treat various heart conditions. Doctors may recommend this procedure for a number of different reasons. The most common reason is to evaluate chest pain. Chest pain can be a symptom of coronary artery disease (CAD), and cardiac catheterization  can show whether plaque is narrowing or blocking your heart's arteries. This procedure is also used to evaluate the valves, as well as measure the blood flow and oxygen levels in different parts of your heart. For further information please visit [email or web address]. Please follow instruction sheet, as given.    Follow-Up: At [facility], you and your health needs are our priority.  As part of our continuing mission to provide you with exceptional heart care, we have created designated Provider Care Teams.  These Care Teams include your primary Cardiologist (physician) and Advanced Practice Providers (APPs -  Physician Assistants and Nurse Practitioners) who all work together to provide you with the care you need, when you need it.   Your next appointment:   1-2 month(s)  Provider:   Nicholes Rough, PA-C, Ambrose Pancoast, NP, Ermalinda Barrios, PA-C, or Christen Bame, NP       Other Instructions  [facility] DEPT OF [place] [place], [place] [id number] [place] [address] Dept: [phone] Loc: [place]  02/08/2023  You are scheduled for a Cardiac Catheterization on TO BE DETERMINED with Dr. TO BE DETERMINED.  1. Please arrive at the [facility] (Main Entrance A) at [facility]: [address] [place], [place] [address] at [place] DETERMINED(This time is two hours before your procedure to ensure your preparation). Free valet parking service is available.   Special note: Every effort is made to have your procedure done on time. Please understand that  emergencies sometimes delay scheduled procedures.  2. Diet: Do not eat solid foods after midnight.  The patient may have clear liquids until 5am upon the day of the procedure.  3. Labs: You will need to have blood drawn on TODAY.  4. Medication instructions in preparation for your procedure:   Contrast Allergy:  No    On the morning of your procedure, take your Plavix/Clopidogrel and any morning medicines NOT listed above.  You may use sips of water.  5. Plan for one night stay--bring personal belongings. 6. Bring a current list of your medications and current insurance cards. 7. You MUST have a responsible person to drive you home. 8. Someone MUST be with you the first 24 hours after you arrive home or your discharge will be delayed. 9. Please wear clothes that are easy to get on and off and wear slip-on shoes.  Thank you for allowing [place] to care for you!   -- [facility] Invasive Cardiovascular services     Signed, Werner Lean, MD  02/08/2023 5:20 PM    [place]

## 2023-02-06 NOTE — H&P (View-Only) (Signed)
Cardiology Office Note:    Date:  02/08/2023   ID:  DOCK SHAMBURG, DOB February 04, 1941, MRN [id number]  PCP:  Serita Grammes, MD   [facility] Providers Cardiologist:  Candee Furbish, MD     Referring MD: Serita Grammes, MD   CC: DOD CP  History of Present Illness:    Sean Hammond is a 82 y.o. male with a hx of CAD s/p PCI LCX in 2013, then RCA and OM1 with R to L collaterals and residual marginal disease complicated with statin intolerance who presents as a DOD visit for chest pain.  Patient notes that he is doing poorly.   Since last visit notes that he has had new chest pain.  His anginal equivalent is DOE and SOB.    No chest pain or pressure was bad enough he went to the ED on Sunday.  No evidence of NSTEMI. Since this winter he has new SOB: he cannot hunt rabbits.  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 No weight gain or leg swelling.  No palpitations or syncope. He has had new dizziness. His kidney function is always a bit high because he has one kidney. During ED visit Creatinine was 1.9. His BP is normal normal or high. He cannot tolerate the IMDUR at current dose due to HA but he is taking it to stave off resting SOB. He does not take the nitro because of similar symptoms.    Past Medical History:  Diagnosis Date   Allergic rhinitis    BPH (benign prostatic hyperplasia)    urologist-- dr t. [name]   CAD (coronary artery disease) cardiologsit--- dr [name]   a. 12-14-2011 s/p PCI with DES x2 to midLCFx. b. NSTEMI 06/2020 s/p DES to RCA and staged DES to OM1.   Chronic headaches    due to sinues   ED (erectile dysfunction)    GERD (gastroesophageal reflux disease)    Hematuria    History of acute pyelonephritis    08-11-2019   History of echocardiogram    last one 03-22-2015   mild LVH,   ef 55-60%   History of kidney stones    History of melanoma excision    non-malignant excised from ear   History of MI (myocardial infarction)    per pt remote hx yrs ago  prior to 2013   History of nonmelanoma skin cancer    per pt SCC and BCC from back, head, face  excision   History of prostatitis    Hydronephrosis, right    Hyperlipidemia    Hypertension    Moderate obstructive sleep apnea    not used cpap since 2014 bought bed that head would elevate    Osteoarthritis of hand    PONV (postoperative nausea and vomiting)    Pt may have been confused after surgery in the past   Statin intolerance    Wears glasses     Past Surgical History:  Procedure Laterality Date   BONE CYST EXCISION  ~ 2004   left knee   CARDIAC CATHETERIZATION N/A 05/09/2016   Procedure: Left Heart Cath and Coronary Angiography;  Surgeon: Jerline Pain, MD;  Location: [facility];  Service: Cardiovascular;  Laterality: N/A;   CHOLECYSTECTOMY N/A 03/23/2015   Procedure: LAPAROSCOPIC CHOLECYSTECTOMY;  Surgeon: Ralene Ok, MD;  Location: [facility];  Service: General;  Laterality: N/A;   CORONARY ANGIOPLASTY WITH STENT PLACEMENT  12/14/11   "2"   CORONARY STENT INTERVENTION N/A 07/01/2020  Procedure: CORONARY STENT INTERVENTION;  Surgeon: Belva Crome, MD;  Location: [facility];  Service: Cardiovascular;  Laterality: N/A;   CORONARY STENT INTERVENTION N/A 07/05/2020   Procedure: CORONARY STENT INTERVENTION;  Surgeon: Belva Crome, MD;  Location: [facility];  Service: Cardiovascular;  Laterality: N/A;   CYSTOSCOPY/RETROGRADE/URETEROSCOPY Bilateral 09/05/2019   Procedure: CYSTOSCOPY/ BILATERAL RETROGRADE/ RIGHT  URETEROSCOPY with biopsy with laser ablation of tumor;  Surgeon: Alexis Frock, MD;  Location: [facility];  Service: Urology;  Laterality: Bilateral;   ETHMOIDECTOMY Right 09/01/2019   Procedure: RIGHT REVISION ENDOSCOPIC ETHMOIDECTOMY;  Surgeon: Izora Gala, MD;  Location: [facility];  Service: ENT;  Laterality: Right;   FRONTAL SINUSOTOMY Bilateral 07-23-2003   dr Constance Holster   re-do   HOLMIUM LASER APPLICATION Right  [id number]   Procedure: HOLMIUM LASER APPLICATION;  Surgeon: Alexis Frock, MD;  Location: [facility];  Service: Urology;  Laterality: Right;   KNEE ARTHROSCOPY Left ` 2010   LEFT HEART CATH AND CORONARY ANGIOGRAPHY N/A 07/01/2020   Procedure: LEFT HEART CATH AND CORONARY ANGIOGRAPHY;  Surgeon: Belva Crome, MD;  Location: [facility];  Service: Cardiovascular;  Laterality: N/A;   LEFT HEART CATHETERIZATION WITH CORONARY ANGIOGRAM N/A 12/14/2011   Procedure: LEFT HEART CATHETERIZATION WITH CORONARY ANGIOGRAM;  Surgeon: Candee Furbish, MD;  Location: [facility];  Service: Cardiovascular;  Laterality: N/A;   PERCUTANEOUS CORONARY STENT INTERVENTION (PCI-S)  12/14/2011   Procedure: PERCUTANEOUS CORONARY STENT INTERVENTION (PCI-S);  Surgeon: Candee Furbish, MD;  Location: [facility] C[facility] LAB;  Service: Cardiovascular;;   ROBOT ASSITED LAPAROSCOPIC NEPHROURETERECTOMY Right 11/28/2019   Procedure: XI ROBOT ASSITED LAPAROSCOPIC NEPHROURETERECTOMY;  Surgeon: Alexis Frock, MD;  Location: [facility];  Service: Urology;  Laterality: Right;  3 HRS   SEPTOPLASTY WITH ETHMOIDECTOMY, AND MAXILLARY ANTROSTOMY  09-22-2002   dr Constance Holster   and sinusotomy   TOTAL KNEE ARTHROPLASTY Left 05/09/2017   Procedure: LEFT TOTAL KNEE ARTHROPLASTY;  Surgeon: Latanya Maudlin, MD;  Location: [facility];  Service: Orthopedics;  Laterality: Left;    Current Medications: Current Meds  Medication Sig   albuterol (VENTOLIN HFA) 108 (90 Base) MCG/ACT inhaler Inhale 2 puffs into the lungs as needed for shortness of breath or wheezing.   allopurinol (ZYLOPRIM) 100 MG tablet Take 1 tablet by mouth as needed (gout).   Ascorbic Acid (VITAMIN C PO) Take 1 tablet by mouth daily.    Azelastine HCl 137 MCG/SPRAY SOLN Place 2 sprays into both nostrils 2 (two) times daily.   clopidogrel (PLAVIX) 75 MG tablet Take 1 tablet (75 mg total) by mouth daily.   dicyclomine (BENTYL) 10 MG capsule Take 10 mg by mouth every 6 (six) hours as needed  for spasms.   diphenhydrAMINE (BENADRYL) 25 MG tablet Take 25 mg by mouth every 6 (six) hours as needed for allergies.    EPIPEN 2-PAK 0.3 MG/0.3ML SOAJ injection Inject 0.3 mg into the muscle once as needed (for an anaphylactic reaction).    Evolocumab (REPATHA SURECLICK) [id number] MG/ML SOAJ Inject 140 mg into the skin every 14 (fourteen) days.   ipratropium (ATROVENT) 0.06 % nasal spray USE 1-2 SPRAYS IN EACH NOSTRIL EVERY 6 HOURS IF NEEDED TO DRY UP NOSE.   isosorbide mononitrate (IMDUR) 30 MG 24 hr tablet Take 1 tablet (30 mg total) by mouth daily.   loratadine (CLARITIN) 10 MG tablet Take 10 mg by mouth daily.   meclizine (ANTIVERT) 25 MG tablet Take 25 mg by mouth 2 (  two) times daily as needed for dizziness.   metoprolol succinate (TOPROL-XL) 25 MG 24 hr tablet Take 1 tablet (25 mg total) by mouth daily.   Multiple Vitamin (MULTIVITAMIN PO) Take 1 tablet by mouth daily.    nitroGLYCERIN (NITROSTAT) 0.4 MG SL tablet PLACE 1 TABLET UNDER TONGUE EVERY 5 MINUTES AS NEEDED FOR CHEST PAIN MAX 3 TABS/15 MIN   pantoprazole (PROTONIX) 40 MG tablet TAKE 1 TABLET (40 MG TOTAL) BY MOUTH DAILY AFTER BREAKFAST.   sodium fluoride (FLUORISHIELD) 1.1 % GEL dental gel See admin instructions.   tamsulosin (FLOMAX) 0.4 MG CAPS capsule Take 1 capsule (0.4 mg total) by mouth daily.   traMADol (ULTRAM) 50 MG tablet Take 50 mg by mouth every 6 (six) hours as needed (pain).    VITAMIN D PO Take 1 tablet by mouth daily.    [DISCONTINUED] valsartan (DIOVAN) 160 MG tablet Take 160 mg by mouth daily.     Allergies:   Beef-derived products, Pork-derived products, Codeine, Crestor [rosuvastatin], Dilaudid [hydromorphone hcl], Hydrocodone, Lipitor [atorvastatin], Percocet [oxycodone-acetaminophen], and Pitavastatin   Social History   Socioeconomic History   Marital status: Married    Spouse name: Not on file   Number of children: Not on file   Years of education: Not on file   Highest education level: Not on file   Occupational History   Not on file  Tobacco Use   Smoking status: Former    Packs/day: 2.00    Years: 12.00    Additional pack years: 0.00    Total pack years: 24.00    Types: Cigarettes    Quit date: 09/03/1974    Years since quitting: 48.4   Smokeless tobacco: Never  Vaping Use   Vaping Use: Never used  Substance and Sexual Activity   Alcohol use: Not Currently   Drug use: Never   Sexual activity: Not on file  Other Topics Concern   Not on file  Social History Narrative   Not on file   Social Determinants of Health   Financial Resource Strain: Not on file  Food Insecurity: Not on file  Transportation Needs: Not on file  Physical Activity: Not on file  Stress: Not on file  Social Connections: Not on file     Family History: The patient's family history includes Heart attack in his mother; Hypertension in his mother.  ROS:   Please see the history of present illness.     EKGs/Labs/Other Studies Reviewed:    The following studies were reviewed today:   EKG:   02/04/2023: SR rate 60  Cardiac Studies & Procedures   CARDIAC CATHETERIZATION  CARDIAC CATHETERIZATION 07/06/2020  Narrative  A stent was successfully placed.   First obtuse marginal 85% mid stenosis reduced to 0% with TIMI grade III flow using a 2.25 x 12 Onyx deployed at 12 atm.  RECOMMENDATIONS:  Aspirin and Plavix x12 months.  Then consider decreasing to Plavix monotherapy thereafter.  Plan discharge later today.  Findings Coronary Findings Diagnostic  Dominance: Right  Left Anterior Descending Prox LAD to Mid LAD lesion is 30% stenosed. Mid LAD-1 lesion is 60% stenosed. Mid LAD-2 lesion is 70% stenosed.  First Diagonal Branch Vessel is small in size.  Second Diagonal Branch 2nd Diag lesion is 30% stenosed.  Left Circumflex Ost Cx to Prox Cx lesion is 70% stenosed. Prox Cx to Dist Cx lesion is 20% stenosed. The lesion was previously treated.  First Obtuse Marginal  Branch 1st Mrg-1 lesion is 40% stenosed. 1st Mrg-2  lesion is 85% stenosed.  Third Obtuse Marginal Branch Vessel is small in size.  Intervention  1st Mrg-2 lesion Stent A stent was successfully placed. Post-Intervention Lesion Assessment The intervention was successful. There is a 0% residual stenosis post intervention.   CARDIAC CATHETERIZATION 07/01/2020  Narrative  Subtotal occlusion with greater than 95% stenosis from proximal to mid RCA.  Week left-to-right collaterals are noted.  Successful stenting of the right coronary with reduction in stenosis to 0% and TIMI grade III flow using a 30 x 2.75 mm Onyx postdilated to 3.0 mm in diameter.  Left main is widely patent  LAD contains moderate to moderately severe mid atherosclerosis.  No high-grade obstruction is noted.  Circumflex contains mid vessel stents.  The stents started just below the first obtuse marginal.  The first marginal contains a proximal 85% stenosis.  The circumflex proximal to the origin of the first obtuse marginal contains eccentric 70 to 80% stenosis.  The obtuse marginal and circumflex will need percutaneous intervention.  Should be staged he electively performed after 72 hours post contrast exposure on this procedure.  Inferior wall hypokinesis.  EF is 50%.  RECOMMENDATIONS:  Aspirin and Plavix for least 12 months and eventually consider Plavix monotherapy.  If no recurrent angina with ambulation tomorrow, will be okay to discharge the patient and have him return next week for PCI on circumflex and obtuse marginal.  If he has angina with physical activity prior to discharge, he should be kept in house over the weekend to undergo staged PCI on Monday.  If he has elective PCI next week it will be performed by me later in the week.  If he stays over the weekend, he will need to be put on the schedule of one of the interventional cardiology team on Monday.  Aggressive risk factor  modification.  Findings Coronary Findings Diagnostic  Dominance: Right  Left Anterior Descending Prox LAD to Mid LAD lesion is 30% stenosed. Mid LAD-1 lesion is 60% stenosed. Mid LAD-2 lesion is 70% stenosed.  First Diagonal Branch Vessel is small in size.  Second Diagonal Branch 2nd Diag lesion is 30% stenosed.  Left Circumflex Prox Cx lesion is 75% stenosed. Prox Cx to Dist Cx lesion is 20% stenosed. The lesion was previously treated.  First Obtuse Marginal Branch 1st Mrg lesion is 90% stenosed.  Third Obtuse Marginal Branch Vessel is small in size.  Right Coronary Artery Prox RCA-1 lesion is 99% stenosed. Prox RCA-2 lesion is 75% stenosed.  Right Posterior Descending Artery Collaterals RPDA filled by collaterals from 2nd Sept.  Intervention  Prox RCA-1 lesion Stent (Also treats lesions: Prox RCA-2) A stent was successfully placed. Post-Intervention Lesion Assessment The intervention was successful. There is a 0% residual stenosis post intervention.  Prox RCA-2 lesion Stent (Also treats lesions: Prox RCA-1) See details in Prox RCA-1 lesion. Post-Intervention Lesion Assessment The intervention was successful. There is a 0% residual stenosis post intervention.     ECHOCARDIOGRAM  ECHOCARDIOGRAM COMPLETE 03/22/2015  Narrative *[address] [employer]. [place], [place] [address] [phone]  ------------------------------------------------------------------- Transthoracic Echocardiography  Patient:    Guner, Avalo MR #:       [id number] Study Date: 03/22/2015 Gender:     M Age:        76 Height:     185.4 cm Weight:     85.3 kg BSA:        2.1 m^2 Pt. Status: Room:       1435  SONOGRAPHER  Tresa Res, RDCS ADMITTING    Verlee Monte [id number] Gladis Riffle, [place] Frederico Hamman, [place] PERFORMING   [facility],  Inpatient  cc:  ------------------------------------------------------------------- LV EF: 55% -   60%  ------------------------------------------------------------------- Indications:      Syncope 780.2.  ------------------------------------------------------------------- History:   PMH:   Coronary artery disease.  PMH:   Myocardial infarction.  ------------------------------------------------------------------- Study Conclusions  - Left ventricle: The cavity size was normal. Wall thickness was increased in a pattern of mild LVH. Systolic function was normal. The estimated ejection fraction was in the range of 55% to 60%. Wall motion was normal; there were no regional wall motion abnormalities. Left ventricular diastolic function parameters were normal. - Pulmonary arteries: PA peak pressure: 33 mm Hg (S).  Transthoracic echocardiography.  M-mode, complete 2D, spectral Doppler, and color Doppler.  Birthdate:  Patient birthdate: [birth year].  Age:  Patient is 82 yr old.  Sex:  Gender: male. BMI: 24.8 kg/m^2.  Blood pressure:     128/74  Patient status: Inpatient.  Study date:  Study date: 03/22/2015. Study time: 10:25 AM.  Location:  Bedside.  -------------------------------------------------------------------  ------------------------------------------------------------------- Left ventricle:  The cavity size was normal. Wall thickness was increased in a pattern of mild LVH. Systolic function was normal. The estimated ejection fraction was in the range of 55% to 60%. Wall motion was normal; there were no regional wall motion abnormalities. The transmitral flow pattern was normal. The deceleration time of the early transmitral flow velocity was normal. The pulmonary vein flow pattern was normal. The tissue Doppler parameters were normal. Left ventricular diastolic function parameters were normal.  ------------------------------------------------------------------- Aortic  valve:   Structurally normal valve.   Cusp separation was normal.  Doppler:  Transvalvular velocity was within the normal range. There was no stenosis. There was no regurgitation.  ------------------------------------------------------------------- Aorta:  The aorta was normal, not dilated, and non-diseased.  ------------------------------------------------------------------- Mitral valve:   Structurally normal valve.   Leaflet separation was normal.  Doppler:  Transvalvular velocity was within the normal range. There was no evidence for stenosis. There was no regurgitation.  ------------------------------------------------------------------- Left atrium:  The atrium was normal in size.  ------------------------------------------------------------------- Right ventricle:  The cavity size was normal. Wall thickness was normal. Systolic function was normal.  ------------------------------------------------------------------- Pulmonic valve:    The valve appears to be grossly normal. Doppler:  There was no significant regurgitation.  ------------------------------------------------------------------- Tricuspid valve:   Structurally normal valve.   Leaflet separation was normal.  Doppler:  Transvalvular velocity was within the normal range. There was mild regurgitation.  ------------------------------------------------------------------- Right atrium:  The atrium was normal in size.  ------------------------------------------------------------------- Pericardium:  There was no pericardial effusion.  ------------------------------------------------------------------- Post procedure conclusions Ascending Aorta:  - The aorta was normal, not dilated, and non-diseased.  ------------------------------------------------------------------- Measurements  Left ventricle                         Value        Reference LV ID, ED, PLAX chordal                48.2  mm     43 - 52 LV ID,  ES, PLAX chordal                35.6  mm     23 - 38 LV fx shortening, PLAX chordal (L)     26    %      >=  29 LV PW thickness, ED                    9.37  mm     --------- IVS/LV PW ratio, ED                    1.24         <=1.3 LV e&', lateral                         7.07  cm/s   --------- LV E/e&', lateral                       8.09         --------- LV e&', medial                          6.85  cm/s   --------- LV E/e&', medial                        8.35         --------- LV e&', average                         6.96  cm/s   --------- LV E/e&', average                       8.22         ---------  Ventricular septum                     Value        Reference IVS thickness, ED                      11.6  mm     ---------  LVOT                                   Value        Reference LVOT ID, S                             21    mm     --------- LVOT area                              3.46  cm^2   ---------  Aorta                                  Value        Reference Aortic root ID, ED                     33    mm     ---------  Left atrium                            Value        Reference LA ID, A-P, ES  29    mm     --------- LA ID/bsa, A-P                         1.38  cm/m^2 <=2.2 LA volume, S                           45.4  ml     --------- LA volume/bsa, S                       21.6  ml/m^2 --------- LA volume, ES, 1-p A4C                 32.8  ml     --------- LA volume/bsa, ES, 1-p A4C             15.6  ml/m^2 --------- LA volume, ES, 1-p A2C                 56.3  ml     --------- LA volume/bsa, ES, 1-p A2C             26.8  ml/m^2 ---------  Mitral valve                           Value        Reference Mitral E-wave peak velocity            57.2  cm/s   --------- Mitral A-wave peak velocity            59.7  cm/s   --------- Mitral E/A ratio, peak                 1            ---------  Pulmonary arteries                     Value         Reference PA pressure, S, DP             (H)     33    mm Hg  <=30  Tricuspid valve                        Value        Reference Tricuspid regurg peak velocity         250   cm/s   --------- Tricuspid peak RV-RA gradient          25    mm Hg  ---------  Systemic veins                         Value        Reference Estimated CVP                          8     mm Hg  ---------  Right ventricle                        Value        Reference RV pressure, S, DP             (H)     33    mm Hg  <=30 RV s&', lateral, S  13.2  cm/s   ---------  Legend: (L)  and  (H)  mark values outside specified reference range.  ------------------------------------------------------------------- Prepared and Electronically Authenticated by  Darlin Coco 2016-05-02T18:16:27              Recent Labs: 02/04/2023: ALT 11; B Natriuretic Peptide 149.6; BUN 29; Creatinine, Ser 1.93; Hemoglobin 12.9; Platelets 163; Potassium 4.3; Sodium 136; TSH 1.863  Recent Lipid Panel    Component Value Date/Time   CHOL 121 09/20/2020 0904   TRIG 89 09/20/2020 0904   HDL 42 09/20/2020 0904   CHOLHDL 2.9 09/20/2020 0904   CHOLHDL 3.6 07/01/2020 0301   VLDL 10 07/01/2020 0301   LDLCALC 62 09/20/2020 0904     Physical Exam:    VS:  BP 104/68   Pulse 70   Ht 6\' 1"  (1.854 m)   Wt 181 lb (82.1 kg)   SpO2 97%   BMI 23.88 kg/m     Wt Readings from Last 3 Encounters:  02/08/23 181 lb (82.1 kg)  02/04/23 180 lb (81.6 kg)  09/27/22 187 lb 9.6 oz (85.1 kg)      HEENT: Normal NECK: No JVD CARDIAC: RRR, no murmurs, rubs, gallops RESPIRATORY:  Clear to auscultation without rales, wheezing or rhonchi  ABDOMEN: Soft, non-tender, non-distended MUSCULOSKELETAL:  No edema; No deformity  SKIN: Warm and dry NEUROLOGIC:  Alert and oriented x 3 PSYCHIATRIC:  Normal affect   ASSESSMENT:    1. Coronary artery disease involving native coronary artery of native heart with unstable angina pectoris  (HCC)   2. Chest pain, unspecified type   3. Moderate obstructive sleep apnea    PLAN:    Coronary Artery Disease; Obstructive With HTN and HLD and CKD  - symptomatic and not presently tolerating his medical therapy - anatomy: potentially with worsening LAD disease - continue plavix 75 mg - continue BB at 25 mm - continue IMDUR for now - holding his valsartan and checking labs today  Risks and benefits of cardiac catheterization have been discussed with the patient.  These include bleeding, infection, kidney damage, stroke, heart attack, death.  The patient understands these risks and is willing to proceed.  Discussed the alternatives (ranolazine and PET MPI) using SDM tool documented below.    Access recommendations: R radial Procedural considerations: Creatinine is pending today; he will need hydration and may need I-stat BMP  Post cath f/u with Dr. [name] team   Discussed Shared Decision Making Using the ISCHEMIA SDM and [place] Angina Questionnaire-7 PART I: Demographic and clinical information  Age 76 Sex Male Male Geographic region- [place]  Diabetes No Left ventricular ejection fraction (55%) 55 Serum creatinine (1.9) Number of anti-anginal medications: 2 Degree of ischemia:  Unknown Multivessel disease  PART II: [place] Angina Questionnaire-7  1. The following is a list of activities that people often do during the week. Although for some people with several medical problems it is difficult to determine what it is that limits them, please go over the activities listed below and indicate how much limitation you have had due to chest pain, chest tightness or angina over the past 4 weeks.  a. Walking indoors on level ground Moderately limited   b. Gardening, vacuuming or carrying groceries Quite a bit limited   c. Lifting or moving heavy objects (e.g. furniture, children) Quite a bit limited  2. Over the past 4 weeks, on average, how many times have  you had chest pain, chest tightness or angina? 4 or more  times per day  3. Over the past 4 weeks, on average, how many times have you had to take nitroglycerin (nitroglycerin tablets or spray) for your chest pain, chest tightness or angina? I have taken nitroglycerin... 4 or more times per day  4. Over the past 4 weeks, how much has your chest pain, chest tightness or angina limited your enjoyment of life? It has extremely limited my enjoyment of life  5. If you had to spend the rest of your life with your chest pain, chest tightness or angina the way it is right now, how would you feel about this? Mostly dissatisfied      Medication Adjustments/Labs and Tests Ordered: Current medicines are reviewed at length with the patient today.  Concerns regarding medicines are outlined above.  Orders Placed This Encounter  Procedures   CBC   Basic metabolic panel   No orders of the defined types were placed in this encounter.   Patient Instructions  Medication Instructions:  Your physician has recommended you make the following change in your medication:  STOP: Valsartan  *If you need a refill on your cardiac medications before your next appointment, please call your pharmacy*   Lab Work: TODAY: CBC, BMP  If you have labs (blood work) drawn today and your tests are completely normal, you will receive your results only by: [place] (if you have MyChart) OR A paper copy in the mail If you have any lab test that is abnormal or we need to change your treatment, we will call you to review the results.   Testing/Procedures: Your physician has requested that you have a cardiac catheterization. Cardiac catheterization is used to diagnose and/or treat various heart conditions. Doctors may recommend this procedure for a number of different reasons. The most common reason is to evaluate chest pain. Chest pain can be a symptom of coronary artery disease (CAD), and cardiac catheterization  can show whether plaque is narrowing or blocking your heart's arteries. This procedure is also used to evaluate the valves, as well as measure the blood flow and oxygen levels in different parts of your heart. For further information please visit HugeFiesta.tn. Please follow instruction sheet, as given.    Follow-Up: At [facility] you and your health needs are our priority.  As part of our continuing mission to provide you with exceptional heart care, we have created designated Provider Care Teams.  These Care Teams include your primary Cardiologist (physician) and Advanced Practice Providers (APPs -  Physician Assistants and Nurse Practitioners) who all work together to provide you with the care you need, when you need it.   Your next appointment:   1-2 month(s)  Provider:   Nicholes Rough, PA-C, Ambrose Pancoast, NP, Ermalinda Barrios, PA-C, or Christen Bame, NP       Other Instructions  [place] A DEPT OF [place], [place] [id number] [place] [address] Dept: [phone] Loc: [place]  02/08/2023  You are scheduled for a Cardiac Catheterization on TO BE DETERMINED with Dr. TO BE DETERMINED.  1. Please arrive at the [facility] (Main Entrance A) at [facility]: [address] [place], [place] [address] at [place] DETERMINED(This time is two hours before your procedure to ensure your preparation). Free valet parking service is available.   Special note: Every effort is made to have your procedure done on time. Please understand that  emergencies sometimes delay scheduled procedures.  2. Diet: Do not eat solid foods after midnight.  The patient may have clear liquids until 5am upon the day of the procedure.  3. Labs: You will need to have blood drawn on TODAY.  4. Medication instructions in preparation for your procedure:   Contrast Allergy:  No    On the morning of your procedure, take your Plavix/Clopidogrel and any morning medicines NOT listed above.  You may use sips of water.  5. Plan for one night stay--bring personal belongings. 6. Bring a current list of your medications and current insurance cards. 7. You MUST have a responsible person to drive you home. 8. Someone MUST be with you the first 24 hours after you arrive home or your discharge will be delayed. 9. Please wear clothes that are easy to get on and off and wear slip-on shoes.  Thank you for allowing [place] to care for you!   -- [facility] Invasive Cardiovascular services     Signed, Werner Lean, MD  02/08/2023 5:20 PM    [place]

## 2023-02-08 ENCOUNTER — Ambulatory Visit: Payer: Medicare Other | Attending: Internal Medicine | Admitting: Internal Medicine

## 2023-02-08 ENCOUNTER — Encounter: Payer: Self-pay | Admitting: Internal Medicine

## 2023-02-08 VITALS — BP 104/68 | HR 70 | Ht 73.0 in | Wt 181.0 lb

## 2023-02-08 DIAGNOSIS — R079 Chest pain, unspecified: Secondary | ICD-10-CM | POA: Insufficient documentation

## 2023-02-08 DIAGNOSIS — I25110 Atherosclerotic heart disease of native coronary artery with unstable angina pectoris: Secondary | ICD-10-CM | POA: Diagnosis not present

## 2023-02-08 DIAGNOSIS — G4733 Obstructive sleep apnea (adult) (pediatric): Secondary | ICD-10-CM | POA: Diagnosis not present

## 2023-02-08 NOTE — Patient Instructions (Signed)
Medication Instructions:  Your physician has recommended you make the following change in your medication:  STOP: Valsartan  *If you need a refill on your cardiac medications before your next appointment, please call your pharmacy*   Lab Work: TODAY: CBC, BMP  If you have labs (blood work) drawn today and your tests are completely normal, you will receive your results only by: [place] (if you have MyChart) OR A paper copy in the mail If you have any lab test that is abnormal or we need to change your treatment, we will call you to review the results.   Testing/Procedures: Your physician has requested that you have a cardiac catheterization. Cardiac catheterization is used to diagnose and/or treat various heart conditions. Doctors may recommend this procedure for a number of different reasons. The most common reason is to evaluate chest pain. Chest pain can be a symptom of coronary artery disease (CAD), and cardiac catheterization can show whether plaque is narrowing or blocking your heart's arteries. This procedure is also used to evaluate the valves, as well as measure the blood flow and oxygen levels in different parts of your heart. For further information please visit [facility]uge[facility]iesta[facility]tn. Please follow instruction sheet, as given.    Follow-Up: At [facility], you and your health needs are our priority.  As part of our continuing mission to provide you with exceptional heart care, we have created designated Provider Care Teams.  These Care Teams include your primary Cardiologist (physician) and Advanced Practice Providers (APPs -  Physician Assistants and Nurse Practitioners) who all work together to provide you with the care you need, when you need it.   Your next appointment:   1-2 month(s)  Provider:   Nicholes Rough, PA-C, Ambrose Pancoast, NP, Ermalinda Barrios, PA-C, or Christen Bame, NP       Other Instructions  [place] A DEPT OF [place], [place] [id number] [place] [address] Dept: [phone] Loc: [place]  02/08/2023  You are scheduled for a Cardiac Catheterization on TO BE DETERMINED with Dr. TO BE DETERMINED.  1. Please arrive at the [facility] (Main Entrance A) at [facility]: [address] [place], [place] [address] at [place] DETERMINED(This time is two hours before your procedure to ensure your preparation). Free valet parking service is available.   Special note: Every effort is made to have your procedure done on time. Please understand that emergencies sometimes delay scheduled procedures.  2. Diet: Do not eat solid foods after midnight.  The patient may have clear liquids until 5am upon the day of the procedure.  3. Labs: You will need to have blood drawn on TODAY.  4. Medication instructions in preparation for your procedure:   Contrast Allergy: No    On the morning of your procedure, take your Plavix/Clopidogrel and any morning medicines NOT listed above.  You may use sips of water.  5. Plan for one night stay--bring personal belongings. 6. Bring a current list of your medications and current insurance cards. 7. You MUST have a responsible person to drive you home. 8. Someone MUST be with you the first 24 hours after you arrive home or your discharge will be delayed. 9. Please wear clothes that are easy to get on and off and wear slip-on shoes.  Thank you for allowing [place] to care for you!   -- [place] Invasive Cardiovascular  services

## 2023-02-09 ENCOUNTER — Telehealth: Payer: Self-pay

## 2023-02-09 LAB — CBC
Hematocrit: 38.4 % (ref 37.5–51.0)
Hemoglobin: 12.5 g/dL — ABNORMAL LOW (ref 13.0–17.7)
MCH: 30.8 pg (ref 26.6–33.0)
MCHC: 32.6 g/dL (ref 31.5–35.7)
MCV: 95 fL (ref 79–97)
Platelets: 169 10*3/uL (ref 150–450)
RBC: 4.06 x10E6/uL — ABNORMAL LOW (ref 4.14–5.80)
RDW: 13.2 % (ref 11.6–15.4)
WBC: 5.5 10*3/uL (ref 3.4–10.8)

## 2023-02-09 LAB — BASIC METABOLIC PANEL
BUN/Creatinine Ratio: 15 (ref 10–24)
BUN: 27 mg/dL (ref 8–27)
CO2: 19 mmol/L — ABNORMAL LOW (ref 20–29)
Calcium: 9.5 mg/dL (ref 8.6–10.2)
Chloride: 105 mmol/L (ref 96–106)
Creatinine, Ser: 1.83 mg/dL — ABNORMAL HIGH (ref 0.76–1.27)
Glucose: 94 mg/dL (ref 70–99)
Potassium: 5.2 mmol/L (ref 3.5–5.2)
Sodium: 140 mmol/L (ref 134–144)
eGFR: 37 mL/min/{1.73_m2} — ABNORMAL LOW (ref >59)

## 2023-02-09 NOTE — Telephone Encounter (Signed)
Called pt notified of Date and time of LHC.  Wednesday March 27 arrival time of 8 am.  Pt will need 4 hours of hydration prior d/t kidney function.  Also scheduled LHC F/U OV with for 04/10/23 at 9:45 am.  Answered all questions.

## 2023-02-11 ENCOUNTER — Other Ambulatory Visit: Payer: Self-pay | Admitting: Internal Medicine

## 2023-02-11 DIAGNOSIS — I25110 Atherosclerotic heart disease of native coronary artery with unstable angina pectoris: Secondary | ICD-10-CM

## 2023-02-11 MED ORDER — SODIUM CHLORIDE 0.9% FLUSH: 3.0000 mL | Freq: Two times a day (BID) | INTRAVENOUS | Status: AC

## 2023-02-13 ENCOUNTER — Telehealth: Payer: Self-pay | Admitting: *Deleted

## 2023-02-13 NOTE — Telephone Encounter (Signed)
Cardiac Catheterization scheduled at [facility] for: Wednesday February 14, 2023 1 PM Arrival time [place] Entrance A at: 8 AM-pre-procedure hydration-GFR 37  Nothing to eat after midnight prior to procedure, clear liquids until 5 AM day of procedure.  Medication instructions: -Usual morning medications can be taken with sips of water including aspirin 81 mg and Plavix 75 mg.  Confirmed patient has responsible adult to drive home post procedure and be with patient first 24 hours after arriving home.  Plan to go home the same day, you will only stay overnight if medically necessary.  Reviewed procedure instructions, pre-procedure hydration with patient.

## 2023-02-14 ENCOUNTER — Other Ambulatory Visit: Payer: Self-pay

## 2023-02-14 ENCOUNTER — Ambulatory Visit (HOSPITAL_COMMUNITY)
Admission: RE | Admit: 2023-02-14 | Discharge: 2023-02-14 | Disposition: A | Discharge status: Home / Self Care | Payer: Medicare Other | Attending: Cardiovascular Disease | Admitting: Cardiovascular Disease

## 2023-02-14 ENCOUNTER — Encounter (HOSPITAL_COMMUNITY)
Admission: RE | Disposition: A | Discharge status: Home / Self Care | Payer: Self-pay | Source: Home / Self Care | Attending: Cardiovascular Disease

## 2023-02-14 DIAGNOSIS — Z7902 Long term (current) use of antithrombotics/antiplatelets: Secondary | ICD-10-CM | POA: Diagnosis not present

## 2023-02-14 DIAGNOSIS — I2510 Atherosclerotic heart disease of native coronary artery without angina pectoris: Secondary | ICD-10-CM

## 2023-02-14 DIAGNOSIS — G4733 Obstructive sleep apnea (adult) (pediatric): Secondary | ICD-10-CM | POA: Insufficient documentation

## 2023-02-14 DIAGNOSIS — Z955 Presence of coronary angioplasty implant and graft: Secondary | ICD-10-CM | POA: Insufficient documentation

## 2023-02-14 DIAGNOSIS — I25110 Atherosclerotic heart disease of native coronary artery with unstable angina pectoris: Secondary | ICD-10-CM

## 2023-02-14 DIAGNOSIS — R0602 Shortness of breath: Secondary | ICD-10-CM | POA: Diagnosis present

## 2023-02-14 DIAGNOSIS — Z8249 Family history of ischemic heart disease and other diseases of the circulatory system: Secondary | ICD-10-CM | POA: Diagnosis not present

## 2023-02-14 DIAGNOSIS — E785 Hyperlipidemia, unspecified: Secondary | ICD-10-CM | POA: Insufficient documentation

## 2023-02-14 DIAGNOSIS — Z87891 Personal history of nicotine dependence: Secondary | ICD-10-CM | POA: Diagnosis not present

## 2023-02-14 DIAGNOSIS — Z79899 Other long term (current) drug therapy: Secondary | ICD-10-CM | POA: Diagnosis not present

## 2023-02-14 DIAGNOSIS — I25118 Atherosclerotic heart disease of native coronary artery with other forms of angina pectoris: Secondary | ICD-10-CM | POA: Insufficient documentation

## 2023-02-14 DIAGNOSIS — I129 Hypertensive chronic kidney disease with stage 1 through stage 4 chronic kidney disease, or unspecified chronic kidney disease: Secondary | ICD-10-CM | POA: Insufficient documentation

## 2023-02-14 DIAGNOSIS — N189 Chronic kidney disease, unspecified: Secondary | ICD-10-CM | POA: Insufficient documentation

## 2023-02-14 HISTORY — PX: LEFT HEART CATH AND CORONARY ANGIOGRAPHY: CATH118249

## 2023-02-14 HISTORY — PX: CORONARY PRESSURE/FFR STUDY: CATH118243

## 2023-02-14 LAB — POCT ACTIVATED CLOTTING TIME: Activated Clotting Time: 233 seconds

## 2023-02-14 SURGERY — LEFT HEART CATH AND CORONARY ANGIOGRAPHY
Anesthesia: LOCAL

## 2023-02-14 MED ORDER — HYDRALAZINE HCL 20 MG/ML IJ SOLN: 10.0000 mg | INTRAMUSCULAR | Status: DC | PRN

## 2023-02-14 MED ORDER — LIDOCAINE HCL (PF) 1 % IJ SOLN
INTRAMUSCULAR | Status: DC | PRN
  Administered 2023-02-14: 2 mL via INTRADERMAL

## 2023-02-14 MED ORDER — VERAPAMIL HCL 2.5 MG/ML IV SOLN
INTRAVENOUS | Status: DC | PRN
  Administered 2023-02-14: 10 mL via INTRA_ARTERIAL

## 2023-02-14 MED ORDER — HEPARIN SODIUM (PORCINE) 1000 UNIT/ML IJ SOLN
INTRAMUSCULAR | Status: AC
  Filled 2023-02-14: qty 10

## 2023-02-14 MED ORDER — LABETALOL HCL 5 MG/ML IV SOLN: 10.0000 mg | INTRAVENOUS | Status: DC | PRN

## 2023-02-14 MED ORDER — SODIUM CHLORIDE 0.9% FLUSH: 3.0000 mL | INTRAVENOUS | Status: DC | PRN

## 2023-02-14 MED ORDER — SODIUM CHLORIDE 0.9 % IV SOLN: INTRAVENOUS | Status: AC

## 2023-02-14 MED ORDER — HEPARIN (PORCINE) IN NACL 1000-0.9 UT/500ML-% IV SOLN
INTRAVENOUS | Status: DC | PRN
  Administered 2023-02-14 (×2): 500 mL

## 2023-02-14 MED ORDER — HEPARIN SODIUM (PORCINE) 1000 UNIT/ML IJ SOLN
INTRAMUSCULAR | Status: DC | PRN
  Administered 2023-02-14 (×3): 4000 [IU] via INTRAVENOUS

## 2023-02-14 MED ORDER — SODIUM CHLORIDE 0.9% FLUSH: 3.0000 mL | Freq: Two times a day (BID) | INTRAVENOUS | Status: DC

## 2023-02-14 MED ORDER — MIDAZOLAM HCL 2 MG/2ML IJ SOLN
INTRAMUSCULAR | Status: DC | PRN
  Administered 2023-02-14: 1 mg via INTRAVENOUS

## 2023-02-14 MED ORDER — VERAPAMIL HCL 2.5 MG/ML IV SOLN
INTRAVENOUS | Status: AC
  Filled 2023-02-14: qty 2

## 2023-02-14 MED ORDER — SODIUM CHLORIDE 0.9 % IV SOLN: 250.0000 mL | INTRAVENOUS | Status: DC | PRN

## 2023-02-14 MED ORDER — FENTANYL CITRATE (PF) 100 MCG/2ML IJ SOLN
INTRAMUSCULAR | Status: AC
  Filled 2023-02-14: qty 2

## 2023-02-14 MED ORDER — ONDANSETRON HCL 4 MG/2ML IJ SOLN: 4.0000 mg | Freq: Four times a day (QID) | INTRAMUSCULAR | Status: DC | PRN

## 2023-02-14 MED ORDER — FENTANYL CITRATE (PF) 100 MCG/2ML IJ SOLN
INTRAMUSCULAR | Status: DC | PRN
  Administered 2023-02-14: 25 ug via INTRAVENOUS

## 2023-02-14 MED ORDER — MIDAZOLAM HCL 2 MG/2ML IJ SOLN
INTRAMUSCULAR | Status: AC
  Filled 2023-02-14: qty 2

## 2023-02-14 MED ORDER — SODIUM CHLORIDE 0.9 % WEIGHT BASED INFUSION: 1.0000 mL/kg/h | INTRAVENOUS | Status: DC

## 2023-02-14 MED ORDER — SODIUM CHLORIDE 0.9 % WEIGHT BASED INFUSION
3.0000 mL/kg/h | INTRAVENOUS | Status: AC
  Administered 2023-02-14: 3 mL/kg/h via INTRAVENOUS

## 2023-02-14 MED ORDER — IOHEXOL 350 MG/ML SOLN
INTRAVENOUS | Status: DC | PRN
  Administered 2023-02-14: 50 mL

## 2023-02-14 MED ORDER — LIDOCAINE HCL (PF) 1 % IJ SOLN
INTRAMUSCULAR | Status: AC
  Filled 2023-02-14: qty 30

## 2023-02-14 MED ORDER — ASPIRIN 81 MG PO CHEW: 81.0000 mg | CHEWABLE_TABLET | ORAL | Status: DC

## 2023-02-14 SURGICAL SUPPLY — 13 items
CATH 5FR JL3.5 JR4 ANG PIG MP (CATHETERS) IMPLANT
CATH VISTA GUIDE 6FR XB3 (CATHETERS) IMPLANT
DEVICE RAD COMP TR BAND LRG (VASCULAR PRODUCTS) IMPLANT
GLIDESHEATH SLEND SS 6F .021 (SHEATH) IMPLANT
GUIDEWIRE INQWIRE 1.5J.035X260 (WIRE) IMPLANT
GUIDEWIRE PRESSURE X 175 (WIRE) IMPLANT
INQWIRE 1.5J .035X260CM (WIRE) ×1
KIT ESSENTIALS PG (KITS) IMPLANT
KIT HEART LEFT (KITS) ×2 IMPLANT
PACK CARDIAC CATHETERIZATION (CUSTOM PROCEDURE TRAY) ×2 IMPLANT
TRANSDUCER W/STOPCOCK (MISCELLANEOUS) ×2 IMPLANT
TUBING CIL FLEX 10 FLL-RA (TUBING) ×2 IMPLANT
WIRE HI TORQ VERSACORE-J 145CM (WIRE) IMPLANT

## 2023-02-14 NOTE — Progress Notes (Signed)
Ignacia Bayley, PA in to check right radial site and ok to d/c home

## 2023-02-14 NOTE — Interval H&P Note (Signed)
History and Physical Interval Note:  02/14/2023 8:08 AM  Sean Hammond  has presented today for surgery, with the diagnosis of chest pain.  The various methods of treatment have been discussed with the patient and family. After consideration of risks, benefits and other options for treatment, the patient has consented to  Procedure(s): LEFT HEART CATH AND CORONARY ANGIOGRAPHY (N/A) as a surgical intervention.  The patient's history has been reviewed, patient examined, no change in status, stable for surgery.  I have reviewed the patient's chart and labs.  Questions were answered to the patient's satisfaction.    Cath Lab Visit (complete for each Cath Lab visit)  Clinical Evaluation Leading to the Procedure:   ACS: No.  Non-ACS:    Anginal Classification: CCS III  Anti-ischemic medical therapy: Maximal Therapy (2 or more classes of medications)  Non-Invasive Test Results: No non-invasive testing performed  Prior CABG: No previous CABG        Lauree Chandler

## 2023-02-14 NOTE — Progress Notes (Addendum)
When radial band removed, small amt oozing right radial and pressure held x 10min and no further oozing noted, site bruised, no hematoma; right radial pulse 3+; Dr Angelena Form notified and no new orders

## 2023-02-15 ENCOUNTER — Encounter (HOSPITAL_COMMUNITY): Payer: Self-pay | Admitting: Cardiovascular Disease

## 2023-02-15 ENCOUNTER — Encounter: Payer: Self-pay | Admitting: Cardiology

## 2023-02-16 ENCOUNTER — Telehealth: Payer: Self-pay

## 2023-02-16 NOTE — Telephone Encounter (Signed)
Called patient to discuss his concerns about his isosorbide mononitrate that he wrote in about on MyChart. Patient states he had a cath 02/14/23 and no stents were placed. He states he was started on isosorbide mononitrate in November 2024 and he has had headaches, dizziness since then. Patient clarifying that his daughter helped him write his Mychart note and he has chronic SOB on exertion that improves with rest. (He states daughter mistakenly wrote that he has severe SOB.) Patient saw Dr. Gasper Sells 02/08/23 where it was documented that patient was concerned about isosorbide mononitrate intolerance but agreed to continue taking it because otherwise he had SOB at rest.  Patient states he was previously taking his metoprolol and isosorbide mononitrate together in the morning with breakfast. He states he wants to try taking his metoprolol at night to see if this alleviates his symptoms. I asked is he has been tracking his BP or HR at home, he says he has not. I advised him that I would forward his questions to Dr. [name], asked him to track HR and BP over the next few days, patient verbalized understanding and agreed to plan.

## 2023-02-20 MED ORDER — METOPROLOL SUCCINATE ER 25 MG PO TB24: 25.0000 mg | ORAL_TABLET | Freq: Every evening | ORAL | 3 refills | Status: DC

## 2023-02-20 MED ORDER — ISOSORBIDE MONONITRATE ER 30 MG PO TB24: 30.0000 mg | ORAL_TABLET | Freq: Every morning | ORAL | 3 refills | Status: DC

## 2023-02-20 NOTE — Telephone Encounter (Signed)
Called patient to review Dr. [name] recommendations:   OK to stop isosorbide OK to take metoprolol at night   No answer, left message per [facility] asking patient to call our office to discuss.

## 2023-02-20 NOTE — Addendum Note (Signed)
Addended by: Joni Reining on: 02/20/2023 01:45 PM   Modules accepted: Orders

## 2023-02-20 NOTE — Telephone Encounter (Signed)
Patient calling back, reviewed Dr. [name] recommendations to stop isosorbide mononitrate and ok to take metoprolol at night.  Patient states for the past few days he has been taking metoprolol at night and isosorbide in the morning. He states his headaches are improved and he feels his SOB on exertion is well-managed. He states "I can't believe how much better I feel" and states he would like to continue on this regimen. Updated medication instructions and forwarded patient's response to Dr. [name].

## 2023-03-29 ENCOUNTER — Ambulatory Visit: Payer: Medicare Other | Admitting: Nurse Practitioner

## 2023-04-02 NOTE — Progress Notes (Signed)
Cardiology Office Note:    Date:  04/10/2023   ID:  Sean Hammond, DOB [birth year], MRN [id number]  PCP:  Buckner Malta, MD  [facility] Providers Cardiologist:  Donato Schultz, MD     Referring MD: Buckner Malta, MD   Chief Complaint:  Follow-up     History of Present Illness:   Sean Hammond is a 82 y.o. male with history of CAD overlapping DES Cfx EF 44% 2013. Repeat cath 04/2016 patent circumflex stent other coronaries normal, normal LVEF. Hyperlipidemia intolerant to statins and declined PCSK9I, history of OSA not on CPAP, CKD stage 3-robotic lap right nephrectomy with ureterectomy 11/28/19.   06/30/2020 NSTEMI treated with DES to the RCA 07/01/2020 with staged DES to OM1 07/05/2020, residual moderate to moderately severe mid LAD no high-grade lesion noted, inferior wall hypokinesis EF 50%.   Patient saw Dr. Izora Ribas with chest pain and DOE. Repeat cath 01/2023 non-obstructive CAD.    Patient comes in for f/u. He's doing much better since changing metoprolol to evening. Denies chest pain, dyspnea, edema, palpitations.       Past Medical History:  Diagnosis Date   Allergic rhinitis    BPH (benign prostatic hyperplasia)    urologist-- dr t. Berneice Heinrich   CAD (coronary artery disease) cardiologsit--- dr Anne Fu   a. 12-14-2011 s/p PCI with DES x2 to midLCFx. b. NSTEMI 06/2020 s/p DES to RCA and staged DES to OM1.   Chronic headaches    due to sinues   ED (erectile dysfunction)    GERD (gastroesophageal reflux disease)    Hematuria    History of acute pyelonephritis    08-11-2019   History of echocardiogram    last one 03-22-2015   mild LVH,   ef 55-60%   History of kidney stones    History of melanoma excision    non-malignant excised from ear   History of MI (myocardial infarction)    per pt remote hx yrs ago prior to 2013   History of nonmelanoma skin cancer    per pt SCC and BCC from back, head, face  excision   History of prostatitis    Hydronephrosis, right     Hyperlipidemia    Hypertension    Moderate obstructive sleep apnea    not used cpap since 2014 bought bed that head would elevate    Osteoarthritis of hand    PONV (postoperative nausea and vomiting)    Pt may have been confused after surgery in the past   Statin intolerance    Wears glasses    Current Medications: Current Meds  Medication Sig   albuterol (VENTOLIN HFA) 108 (90 Base) MCG/ACT inhaler Inhale 2 puffs into the lungs as needed for shortness of breath or wheezing.   allopurinol (ZYLOPRIM) 100 MG tablet Take 1 tablet by mouth daily as needed (gout).   Azelastine HCl 137 MCG/SPRAY SOLN Place 2 sprays into both nostrils 2 (two) times daily as needed (allergies).   clopidogrel (PLAVIX) 75 MG tablet Take 1 tablet (75 mg total) by mouth daily.   dicyclomine (BENTYL) 10 MG capsule Take 10 mg by mouth every 6 (six) hours as needed for spasms.   diphenhydrAMINE (BENADRYL) 25 MG tablet Take 25 mg by mouth every 6 (six) hours as needed for allergies.    EPIPEN 2-PAK 0.3 MG/0.3ML SOAJ injection Inject 0.3 mg into the muscle once as needed (for an anaphylactic reaction).    Evolocumab (REPATHA SURECLICK) 140 MG/ML SOAJ Inject 140 mg into the  skin every 14 (fourteen) days.   isosorbide mononitrate (IMDUR) 30 MG 24 hr tablet Take 1 tablet (30 mg total) by mouth in the morning.   meclizine (ANTIVERT) 25 MG tablet Take 25 mg by mouth 2 (two) times daily as needed for dizziness.   metoprolol succinate (TOPROL-XL) 25 MG 24 hr tablet Take 1 tablet (25 mg total) by mouth at bedtime.   pantoprazole (PROTONIX) 40 MG tablet TAKE 1 TABLET (40 MG TOTAL) BY MOUTH DAILY AFTER BREAKFAST.   tamsulosin (FLOMAX) 0.4 MG CAPS capsule Take 1 capsule (0.4 mg total) by mouth daily.   traMADol (ULTRAM) 50 MG tablet Take 50 mg by mouth every 6 (six) hours as needed (pain).    VITAMIN D PO Take 2,000 Units by mouth daily.   [DISCONTINUED] nitroGLYCERIN (NITROSTAT) 0.4 MG SL tablet PLACE 1 TABLET UNDER TONGUE EVERY  5 MINUTES AS NEEDED FOR CHEST PAIN MAX 3 TABS/15 MIN   Current Facility-Administered Medications for the 04/10/23 encounter (Office Visit) with Dyann Kief, PA-C  Medication   sodium chloride flush (NS) 0.9 % injection 3 mL    Allergies:   Codeine, Crestor [rosuvastatin], Dilaudid [hydromorphone hcl], Hydrocodone, Lipitor [atorvastatin], Percocet [oxycodone-acetaminophen], and Pitavastatin   Social History   Tobacco Use   Smoking status: Former    Packs/day: 2.00    Years: 12.00    Additional pack years: 0.00    Total pack years: 24.00    Types: Cigarettes    Quit date: 09/03/1974    Years since quitting: 48.6   Smokeless tobacco: Never  Vaping Use   Vaping Use: Never used  Substance Use Topics   Alcohol use: Not Currently   Drug use: Never    Family Hx: The patient's family history includes Heart attack in his mother; Hypertension in his mother.  ROS     Physical Exam:    VS:  BP 138/86   Pulse 60   Ht 6\' 1"  (1.854 m)   Wt 179 lb 9.6 oz (81.5 kg)   SpO2 96%   BMI 23.70 kg/m     Wt Readings from Last 3 Encounters:  04/10/23 179 lb 9.6 oz (81.5 kg)  02/14/23 181 lb (82.1 kg)  02/08/23 181 lb (82.1 kg)    Physical Exam  GEN: Well nourished, well developed, in no acute distress  Neck: no JVD, carotid bruits, or masses Cardiac:RRR; no murmurs, rubs, or gallops  Respiratory:  clear to auscultation bilaterally, normal work of breathing GI: soft, nontender, nondistended, + BS Ext: without cyanosis, clubbing, or edema, Good distal pulses bilaterally Neuro:  Alert and Oriented x 3,  Psych: euthymic mood, full affect        EKGs/Labs/Other Test Reviewed:    EKG:  EKG is not ordered today.    Recent Labs: 02/04/2023: ALT 11; B Natriuretic Peptide 149.6; TSH 1.863 02/08/2023: BUN 27; Creatinine, Ser 1.83; Hemoglobin 12.5; Platelets 169; Potassium 5.2; Sodium 140   Recent Lipid Panel No results for input(s): "CHOL", "TRIG", "HDL", "VLDL", "LDLCALC",  "LDLDIRECT" in the last 8760 hours.   Prior CV Studies:   Cath 02/14/23  Dist RCA lesion is 40% stenosed.   Prox Cx to Mid Cx lesion is 50% stenosed.   Prox LAD to Mid LAD lesion is 40% stenosed.   Mid LAD lesion is 40% stenosed.   Dist LAD lesion is 40% stenosed.   Previously placed Prox RCA to Mid RCA stent of unknown type is  widely patent.   Previously placed Mid Cx to  Dist Cx stent of unknown type is  widely patent.   Previously placed 2nd Mrg stent of unknown type is  widely patent.   No left main disease.    The LAD is a large caliber vessel that courses to the apex. Mild to moderate non-obstructive disease in the mid and distal LAD   The mid Circumflex is a large caliber vessel with moderate (50%) mid vessel stenosis before the takeoff of the first obtuse marginal branch. RFR assessment of this lesion showed normal flow (RFR=0.97). Patent mid Circumflex stent without restenosis. Patent obtuse marginal stent without restenosis.    Large dominant RCA with patent mid RCA stent without restenosis. Mild distal RCA stenosis.    Recommendations: Continue medical management of CAD. No focal obstructive lesions. See above regarding the moderate mid Circumflex stenosis (RFR 0.97). LVEDP is 8-10 mmHg. Unclear etiology of his dyspnea.    LHC 07/01/20 Subtotal occlusion with greater than 95% stenosis from proximal to mid RCA.  Week left-to-right collaterals are noted. Successful stenting of the right coronary with reduction in stenosis to 0% and TIMI grade III flow using a 30 x 2.75 mm Onyx postdilated to 3.0 mm in diameter. Left main is widely patent LAD contains moderate to moderately severe mid atherosclerosis.  No high-grade obstruction is noted. Circumflex contains mid vessel stents.  The stents started just below the first obtuse marginal.  The first marginal contains a proximal 85% stenosis.  The circumflex proximal to the origin of the first obtuse marginal contains eccentric 70 to 80%  stenosis.  The obtuse marginal and circumflex will need percutaneous intervention.  Should be staged he electively performed after 72 hours post contrast exposure on this procedure. Inferior wall hypokinesis.  EF is 50%.   RECOMMENDATIONS: Aspirin and Plavix for least 12 months and eventually consider Plavix monotherapy. If no recurrent angina with ambulation tomorrow, will be okay to discharge the patient and have him return next week for PCI on circumflex and obtuse marginal.  If he has angina with physical activity prior to discharge, he should be kept in house over the weekend to undergo staged PCI on Monday.  If he has elective PCI next week it will be performed by me later in the week.  If he stays over the weekend, he will need to be put on the schedule of one of the interventional cardiology team on Monday. Aggressive risk factor modification.   LHC 07/05/20 A stent was successfully placed.   First obtuse marginal 85% mid stenosis reduced to 0% with TIMI grade III flow using a 2.25 x 12 Onyx deployed at 12 atm.   RECOMMENDATIONS: Aspirin and Plavix x12 months.  Then consider decreasing to Plavix monotherapy thereafter. Plan discharge later today.     Risk Assessment/Calculations/Metrics:              ASSESSMENT & PLAN:   No problem-specific Assessment & Plan notes found for this encounter.   CAD DES Cfx 2013, NSTEMI 2021 DES RCA staged DES OM1, repeat cath for DOE  04/02/23 non-obstructive CAD continue medical management. No angina.  -continue Plavix, Imdur, toprol -150 min exercise a week.   HTN-controlled  HLD-checked by PCP a month ago and he says his LDL was under 50.on repatha  CKD Crt 1.83            Dispo:  No follow-ups on file.   Medication Adjustments/Labs and Tests Ordered: Current medicines are reviewed at length with the patient today.  Concerns regarding medicines  are outlined above.  Tests Ordered: No orders of the defined types were placed in this  encounter.  Medication Changes: Meds ordered this encounter  Medications   nitroGLYCERIN (NITROSTAT) 0.4 MG SL tablet    Sig: Place 3 tablets (1.2 mg total) under the tongue every 5 (five) minutes as needed for chest pain.    Dispense:  25 tablet    Refill:  7   Signed, Jacolyn Reedy, PA-C  04/10/2023 10:35 AM    [facility] [address] [place], [place], [place]  [address] Phone: [phone]; Fax: [phone]

## 2023-04-10 ENCOUNTER — Encounter: Payer: Self-pay | Admitting: Physician Assistant

## 2023-04-10 ENCOUNTER — Ambulatory Visit: Payer: Medicare Other | Attending: Physician Assistant | Admitting: Physician Assistant

## 2023-04-10 VITALS — BP 138/86 | HR 60 | Ht 73.0 in | Wt 179.6 lb

## 2023-04-10 DIAGNOSIS — I10 Essential (primary) hypertension: Secondary | ICD-10-CM | POA: Insufficient documentation

## 2023-04-10 DIAGNOSIS — E785 Hyperlipidemia, unspecified: Secondary | ICD-10-CM

## 2023-04-10 DIAGNOSIS — N1831 Chronic kidney disease, stage 3a: Secondary | ICD-10-CM

## 2023-04-10 DIAGNOSIS — I25110 Atherosclerotic heart disease of native coronary artery with unstable angina pectoris: Secondary | ICD-10-CM | POA: Diagnosis not present

## 2023-04-10 MED ORDER — NITROGLYCERIN 0.4 MG SL SUBL: 1.0000 mg | SUBLINGUAL_TABLET | SUBLINGUAL | 7 refills | Status: AC | PRN

## 2023-04-10 NOTE — Patient Instructions (Addendum)
Medication Instructions:  Your physician recommends that you continue on your current medications as directed. Please refer to the Current Medication list given to you today.  *If you need a refill on your cardiac medications before your next appointment, please call your pharmacy*   Lab Work: Have primary fax labs to our office at [phone] If you have labs (blood work) drawn today and your tests are completely normal, you will receive your results only by: MyChart Message (if you have MyChart) OR A paper copy in the mail If you have any lab test that is abnormal or we need to change your treatment, we will call you to review the results.   Follow-Up: At [facility], you and your health needs are our priority.  As part of our continuing mission to provide you with exceptional heart care, we have created designated Provider Care Teams.  These Care Teams include your primary Cardiologist (physician) and Advanced Practice Providers (APPs -  Physician Assistants and Nurse Practitioners) who all work together to provide you with the care you need, when you need it.   Your next appointment:   6 month(s)  Provider:   Donato Schultz, MD    Other Instructions Your provider recommends that you maintain 150 minutes per week of moderate aerobic activity.

## 2023-06-30 DIAGNOSIS — J309 Allergic rhinitis, unspecified: Secondary | ICD-10-CM | POA: Diagnosis not present

## 2023-06-30 DIAGNOSIS — J0100 Acute maxillary sinusitis, unspecified: Secondary | ICD-10-CM | POA: Diagnosis not present

## 2023-07-05 DIAGNOSIS — J0100 Acute maxillary sinusitis, unspecified: Secondary | ICD-10-CM | POA: Diagnosis not present

## 2023-07-26 DIAGNOSIS — Z79899 Other long term (current) drug therapy: Secondary | ICD-10-CM | POA: Diagnosis not present

## 2023-07-26 DIAGNOSIS — E782 Mixed hyperlipidemia: Secondary | ICD-10-CM | POA: Diagnosis not present

## 2023-07-26 DIAGNOSIS — I10 Essential (primary) hypertension: Secondary | ICD-10-CM | POA: Diagnosis not present

## 2023-07-26 DIAGNOSIS — I2510 Atherosclerotic heart disease of native coronary artery without angina pectoris: Secondary | ICD-10-CM | POA: Diagnosis not present

## 2023-07-26 DIAGNOSIS — R7303 Prediabetes: Secondary | ICD-10-CM | POA: Diagnosis not present

## 2023-07-26 DIAGNOSIS — N1832 Chronic kidney disease, stage 3b: Secondary | ICD-10-CM | POA: Diagnosis not present

## 2023-07-30 DIAGNOSIS — L578 Other skin changes due to chronic exposure to nonionizing radiation: Secondary | ICD-10-CM | POA: Diagnosis not present

## 2023-07-30 DIAGNOSIS — L820 Inflamed seborrheic keratosis: Secondary | ICD-10-CM | POA: Diagnosis not present

## 2023-07-30 DIAGNOSIS — C44329 Squamous cell carcinoma of skin of other parts of face: Secondary | ICD-10-CM | POA: Diagnosis not present

## 2023-07-30 DIAGNOSIS — L821 Other seborrheic keratosis: Secondary | ICD-10-CM | POA: Diagnosis not present

## 2023-07-30 DIAGNOSIS — L570 Actinic keratosis: Secondary | ICD-10-CM | POA: Diagnosis not present

## 2023-08-19 ENCOUNTER — Other Ambulatory Visit: Payer: Self-pay | Admitting: Cardiology

## 2023-08-24 DIAGNOSIS — Z6823 Body mass index (BMI) 23.0-23.9, adult: Secondary | ICD-10-CM | POA: Diagnosis not present

## 2023-08-24 DIAGNOSIS — Z1339 Encounter for screening examination for other mental health and behavioral disorders: Secondary | ICD-10-CM | POA: Diagnosis not present

## 2023-08-24 DIAGNOSIS — H65191 Other acute nonsuppurative otitis media, right ear: Secondary | ICD-10-CM | POA: Diagnosis not present

## 2023-08-24 DIAGNOSIS — R590 Localized enlarged lymph nodes: Secondary | ICD-10-CM | POA: Diagnosis not present

## 2023-08-31 DIAGNOSIS — K118 Other diseases of salivary glands: Secondary | ICD-10-CM | POA: Diagnosis not present

## 2023-08-31 DIAGNOSIS — R590 Localized enlarged lymph nodes: Secondary | ICD-10-CM | POA: Diagnosis not present

## 2023-08-31 DIAGNOSIS — R221 Localized swelling, mass and lump, neck: Secondary | ICD-10-CM | POA: Diagnosis not present

## 2023-08-31 DIAGNOSIS — I6521 Occlusion and stenosis of right carotid artery: Secondary | ICD-10-CM | POA: Diagnosis not present

## 2023-09-14 ENCOUNTER — Other Ambulatory Visit: Payer: Self-pay | Admitting: Cardiology

## 2023-10-11 DIAGNOSIS — R221 Localized swelling, mass and lump, neck: Secondary | ICD-10-CM | POA: Diagnosis not present

## 2023-10-12 ENCOUNTER — Ambulatory Visit: Payer: Medicare Other | Attending: Cardiology | Admitting: Cardiology

## 2023-10-12 ENCOUNTER — Encounter: Payer: Self-pay | Admitting: Cardiology

## 2023-10-12 VITALS — BP 120/60 | HR 80 | Ht 73.0 in | Wt 179.0 lb

## 2023-10-12 DIAGNOSIS — I209 Angina pectoris, unspecified: Secondary | ICD-10-CM | POA: Insufficient documentation

## 2023-10-12 DIAGNOSIS — I10 Essential (primary) hypertension: Secondary | ICD-10-CM | POA: Diagnosis not present

## 2023-10-12 DIAGNOSIS — E785 Hyperlipidemia, unspecified: Secondary | ICD-10-CM | POA: Diagnosis not present

## 2023-10-12 DIAGNOSIS — N1831 Chronic kidney disease, stage 3a: Secondary | ICD-10-CM | POA: Insufficient documentation

## 2023-10-12 DIAGNOSIS — I25110 Atherosclerotic heart disease of native coronary artery with unstable angina pectoris: Secondary | ICD-10-CM | POA: Insufficient documentation

## 2023-10-12 NOTE — Progress Notes (Signed)
Cardiology Office Note:  .   Date:  10/12/2023  ID:  Sean Hammond, DOB 1941-05-30, MRN [id number] PCP: Buckner Malta, MD  [facility] Providers Cardiologist:  Donato Schultz, MD     History of Present Illness: .   Sean Hammond is a 82 y.o. male Discussed with the use of AI scribe  History of Present Illness   The patient is an 82 year old male with a history of coronary artery disease, right nephrectomy, ureterectomy in 2021, obstructive sleep apnea, and intolerance to statins. The patient's most recent catheterization showed patent RCA stents, patent mid circumflex and distal circumflex stents, and patent second obtuse marginal stent with mild to moderate disease elsewhere. The patient is on Plavix 75mg  monotherapy, Isosorb 30mg  daily, and Toprol 25mg  daily. The patient's hypertension has been well-controlled with Toprol and diet modification, with a current blood pressure of 120/60. The patient also has chronic kidney disease, stage 3B, with a stable creatinine of 1.8, and hyperlipidemia, with the last LDL under 50 on Repatha, a PCSK9 inhibitor.  The patient reports feeling generally well, with no new or worsening symptoms. The patient has been adhering to the prescribed medication regimen and has not reported any adverse effects. The patient has been maintaining an active lifestyle, including regular exercise such as rabbit hunting. The patient has not reported any new health concerns or changes in his condition.          ROS: No CP, no SOB  Studies Reviewed: .        Results LABS Creatinine: 1.8 LDL: <50  RADIOLOGY CT scan: Small growth in sweat gland behind left ear  DIAGNOSTIC Cardiac catheterization: Patent RCA stents, patent mid circumflex and distal circumflex stents, patent second obtuse marginal stent with mild to moderate disease elsewhere (02/14/2023)  Risk Assessment/Calculations:            Physical Exam:   VS:  BP 120/60 (BP Location: Left Arm,  Patient Position: Sitting, Cuff Size: Normal)   Pulse 80   Ht 6\' 1"  (1.854 m)   Wt 179 lb (81.2 kg)   SpO2 98%   BMI 23.62 kg/m    Wt Readings from Last 3 Encounters:  10/12/23 179 lb (81.2 kg)  04/10/23 179 lb 9.6 oz (81.5 kg)  02/14/23 181 lb (82.1 kg)    GEN: Well nourished, well developed in no acute distress NECK: No JVD; No carotid bruits CARDIAC: RRR, no murmurs, no rubs, no gallops RESPIRATORY:  Clear to auscultation without rales, wheezing or rhonchi  ABDOMEN: Soft, non-tender, non-distended EXTREMITIES:  No edema; No deformity   ASSESSMENT AND PLAN: .    Assessment and Plan    Coronary Artery Disease (CAD) [age]-year-old with CAD. Recent catheterization on 02/14/2023 showed patent RCA, mid circumflex, distal circumflex, and second obtuse marginal stents with mild to moderate disease elsewhere. Continues on Plavix 75 mg and Isosorbide 30 mg daily. Blood pressure is well controlled at 120/60 on Toprol 25 mg daily. Intolerant to statins and declined PCSK9 inhibitors. Prefers current medication regimen and active lifestyle including jogging and rabbit hunting. - Continue Plavix 75 mg daily - Continue Isosorbide 30 mg daily - Continue Toprol 25 mg daily at night - Annual follow-up  Hypertension Well-controlled with a current reading of 120/60 on Toprol and dietary modifications. - Continue Toprol 25 mg daily - Continue dietary modifications  Hyperlipidemia Last LDL under 50 on Repatha (PCSK9 inhibitor). Prefers to continue Repatha due to excellent cholesterol control. - Continue Repatha as prescribed -  Continue monitoring by primary care physician  Chronic Kidney Disease (CKD) Stage 3B CKD stage 3B with creatinine of 1.8. Monitored by primary care physician. - Continue monitoring by primary care physician  Obstructive Sleep Apnea (OSA) Not using CPAP therapy. Discussed potential benefits including improved sleep quality and reduced cardiovascular risk. - Discuss  CPAP therapy at next visit  General Health Maintenance Maintaining an active lifestyle with regular exercise, including jogging and rabbit hunting. - Encourage continued physical activity - Annual follow-up  Follow-up - Schedule one-year follow-up visit.              Signed, Donato Schultz, MD

## 2023-10-12 NOTE — Patient Instructions (Signed)

## 2023-11-02 DIAGNOSIS — R0981 Nasal congestion: Secondary | ICD-10-CM | POA: Diagnosis not present

## 2023-11-02 DIAGNOSIS — R519 Headache, unspecified: Secondary | ICD-10-CM | POA: Diagnosis not present

## 2023-11-02 DIAGNOSIS — R059 Cough, unspecified: Secondary | ICD-10-CM | POA: Diagnosis not present

## 2023-11-18 ENCOUNTER — Other Ambulatory Visit: Payer: Self-pay | Admitting: Cardiology

## 2023-12-21 ENCOUNTER — Other Ambulatory Visit: Payer: Self-pay | Admitting: Cardiology

## 2023-12-22 ENCOUNTER — Other Ambulatory Visit: Payer: Self-pay | Admitting: Cardiology

## 2023-12-25 ENCOUNTER — Other Ambulatory Visit (HOSPITAL_COMMUNITY): Payer: Self-pay

## 2023-12-25 ENCOUNTER — Encounter: Payer: Self-pay | Admitting: Cardiology

## 2023-12-25 ENCOUNTER — Telehealth: Payer: Self-pay | Admitting: Pharmacy Technician

## 2023-12-25 NOTE — Telephone Encounter (Signed)
Pharmacy Patient Advocate Encounter   Received notification from Patient Advice Request messages that prior authorization for repatha is required/requested.   Insurance verification completed.   The patient is insured through [employer] .   Per test claim: PA required; PA submitted to above mentioned insurance via CoverMyMeds Key/confirmation #/EOC faxed Status is pending

## 2023-12-26 ENCOUNTER — Other Ambulatory Visit (HOSPITAL_COMMUNITY): Payer: Self-pay

## 2023-12-26 NOTE — Telephone Encounter (Signed)
 Pharmacy Patient Advocate Encounter  Received notification from [employer] that Prior Authorization for repatha  has been APPROVED from 11/21/23 to 12/24/24   PA #/Case ID/Reference #: [id number]

## 2023-12-27 ENCOUNTER — Encounter: Payer: Self-pay | Admitting: Cardiology

## 2023-12-27 NOTE — Telephone Encounter (Signed)
 Daughter informed about approval.

## 2023-12-31 ENCOUNTER — Telehealth: Payer: Self-pay

## 2023-12-31 ENCOUNTER — Other Ambulatory Visit (HOSPITAL_COMMUNITY): Payer: Self-pay

## 2023-12-31 NOTE — Telephone Encounter (Signed)
 Patient Advocate Encounter   The patient was approved for a [employer] grant that will help cover the cost of REPATHA  Total amount awarded, $2,500.  Effective: 12/01/23 - 11/29/24   [id number] WUJ:[id number] [id number] [id number]   Pharmacy provided with approval and processing information. Patient informed via Tilman Fonder, CPhT  Pharmacy Patient Advocate Specialist  Direct Number: [phone] Fax: [phone]

## 2023-12-31 NOTE — Telephone Encounter (Signed)
 Pharmacy Patient Advocate Encounter   Received notification from Physician's Office that prior authorization for REPATHA  is required/requested.   Insurance verification completed.   The patient is insured through [employer] .   Per test claim: The current 28 day co-pay is, $520.33 (DEDUCTIBLE).  No PA needed at this time. This test claim was processed through [facility]- copay amounts may vary at other pharmacies due to pharmacy/plan contracts, or as the patient moves through the different stages of their insurance plan.

## 2024-01-01 ENCOUNTER — Other Ambulatory Visit: Payer: Self-pay | Admitting: Cardiology

## 2024-01-04 ENCOUNTER — Other Ambulatory Visit (HOSPITAL_COMMUNITY): Payer: Self-pay | Admitting: Urology

## 2024-01-04 ENCOUNTER — Ambulatory Visit (HOSPITAL_COMMUNITY)
Admission: RE | Admit: 2024-01-04 | Discharge: 2024-01-04 | Disposition: A | Discharge status: Home / Self Care | Payer: Medicare Other | Source: Ambulatory Visit | Attending: Urology | Admitting: Urology

## 2024-01-04 DIAGNOSIS — R339 Retention of urine, unspecified: Secondary | ICD-10-CM | POA: Insufficient documentation

## 2024-01-04 DIAGNOSIS — Z905 Acquired absence of kidney: Secondary | ICD-10-CM | POA: Insufficient documentation

## 2024-01-04 DIAGNOSIS — K439 Ventral hernia without obstruction or gangrene: Secondary | ICD-10-CM | POA: Diagnosis not present

## 2024-01-04 DIAGNOSIS — C651 Malignant neoplasm of right renal pelvis: Secondary | ICD-10-CM | POA: Insufficient documentation

## 2024-01-04 DIAGNOSIS — C649 Malignant neoplasm of unspecified kidney, except renal pelvis: Secondary | ICD-10-CM | POA: Diagnosis not present

## 2024-01-08 DIAGNOSIS — N281 Cyst of kidney, acquired: Secondary | ICD-10-CM | POA: Diagnosis not present

## 2024-01-08 DIAGNOSIS — K5730 Diverticulosis of large intestine without perforation or abscess without bleeding: Secondary | ICD-10-CM | POA: Diagnosis not present

## 2024-01-08 DIAGNOSIS — C651 Malignant neoplasm of right renal pelvis: Secondary | ICD-10-CM | POA: Diagnosis not present

## 2024-01-08 DIAGNOSIS — N400 Enlarged prostate without lower urinary tract symptoms: Secondary | ICD-10-CM | POA: Diagnosis not present

## 2024-01-08 DIAGNOSIS — Z905 Acquired absence of kidney: Secondary | ICD-10-CM | POA: Diagnosis not present

## 2024-01-21 DIAGNOSIS — C651 Malignant neoplasm of right renal pelvis: Secondary | ICD-10-CM | POA: Diagnosis not present

## 2024-01-21 DIAGNOSIS — Z905 Acquired absence of kidney: Secondary | ICD-10-CM | POA: Diagnosis not present

## 2024-01-21 DIAGNOSIS — R339 Retention of urine, unspecified: Secondary | ICD-10-CM | POA: Diagnosis not present

## 2024-02-27 DIAGNOSIS — Z0000 Encounter for general adult medical examination without abnormal findings: Secondary | ICD-10-CM | POA: Diagnosis not present

## 2024-02-27 DIAGNOSIS — R7302 Impaired glucose tolerance (oral): Secondary | ICD-10-CM | POA: Diagnosis not present

## 2024-02-27 DIAGNOSIS — E538 Deficiency of other specified B group vitamins: Secondary | ICD-10-CM | POA: Diagnosis not present

## 2024-02-27 DIAGNOSIS — Z79899 Other long term (current) drug therapy: Secondary | ICD-10-CM | POA: Diagnosis not present

## 2024-02-27 DIAGNOSIS — N1832 Chronic kidney disease, stage 3b: Secondary | ICD-10-CM | POA: Diagnosis not present

## 2024-02-27 DIAGNOSIS — I10 Essential (primary) hypertension: Secondary | ICD-10-CM | POA: Diagnosis not present

## 2024-02-27 DIAGNOSIS — C651 Malignant neoplasm of right renal pelvis: Secondary | ICD-10-CM | POA: Diagnosis not present

## 2024-02-27 DIAGNOSIS — E782 Mixed hyperlipidemia: Secondary | ICD-10-CM | POA: Diagnosis not present

## 2024-02-27 DIAGNOSIS — I2510 Atherosclerotic heart disease of native coronary artery without angina pectoris: Secondary | ICD-10-CM | POA: Diagnosis not present

## 2024-03-11 DIAGNOSIS — M25572 Pain in left ankle and joints of left foot: Secondary | ICD-10-CM | POA: Diagnosis not present

## 2024-03-11 DIAGNOSIS — S93402A Sprain of unspecified ligament of left ankle, initial encounter: Secondary | ICD-10-CM | POA: Diagnosis not present

## 2024-03-17 ENCOUNTER — Other Ambulatory Visit: Payer: Self-pay | Admitting: Physician Assistant

## 2024-05-28 DIAGNOSIS — R0981 Nasal congestion: Secondary | ICD-10-CM | POA: Diagnosis not present

## 2024-05-28 DIAGNOSIS — H9203 Otalgia, bilateral: Secondary | ICD-10-CM | POA: Diagnosis not present

## 2024-05-30 DIAGNOSIS — D485 Neoplasm of uncertain behavior of skin: Secondary | ICD-10-CM | POA: Diagnosis not present

## 2024-05-30 DIAGNOSIS — D2239 Melanocytic nevi of other parts of face: Secondary | ICD-10-CM | POA: Diagnosis not present

## 2024-05-30 DIAGNOSIS — L570 Actinic keratosis: Secondary | ICD-10-CM | POA: Diagnosis not present

## 2024-06-10 DIAGNOSIS — C4442 Squamous cell carcinoma of skin of scalp and neck: Secondary | ICD-10-CM | POA: Diagnosis not present

## 2024-07-01 DIAGNOSIS — L02811 Cutaneous abscess of head [any part, except face]: Secondary | ICD-10-CM | POA: Diagnosis not present

## 2024-07-26 ENCOUNTER — Other Ambulatory Visit: Payer: Self-pay | Admitting: Cardiology

## 2024-09-05 DIAGNOSIS — D485 Neoplasm of uncertain behavior of skin: Secondary | ICD-10-CM | POA: Diagnosis not present

## 2024-09-05 DIAGNOSIS — L570 Actinic keratosis: Secondary | ICD-10-CM | POA: Diagnosis not present

## 2024-09-05 DIAGNOSIS — C4442 Squamous cell carcinoma of skin of scalp and neck: Secondary | ICD-10-CM | POA: Diagnosis not present

## 2024-10-07 DIAGNOSIS — R0981 Nasal congestion: Secondary | ICD-10-CM | POA: Diagnosis not present

## 2024-10-07 DIAGNOSIS — R059 Cough, unspecified: Secondary | ICD-10-CM | POA: Diagnosis not present

## 2024-10-08 ENCOUNTER — Other Ambulatory Visit: Payer: Self-pay | Admitting: Cardiology

## 2024-10-31 ENCOUNTER — Other Ambulatory Visit: Payer: Self-pay | Admitting: Cardiology

## 2024-11-07 ENCOUNTER — Other Ambulatory Visit: Payer: Self-pay | Admitting: Cardiology

## 2024-11-10 MED ORDER — REPATHA SURECLICK 140 MG/ML ~~LOC~~ SOAJ: 1.0000 | SUBCUTANEOUS | 3 refills | Status: AC

## 2024-11-10 NOTE — Addendum Note (Signed)
 Addended by: Delaynie Stetzer D on: 11/10/2024 11:12 AM   Modules accepted: Orders

## 2024-12-07 ENCOUNTER — Other Ambulatory Visit: Payer: Self-pay | Admitting: Cardiology

## 2024-12-09 ENCOUNTER — Encounter: Payer: Self-pay | Admitting: Cardiology

## 2024-12-10 NOTE — Telephone Encounter (Signed)
 Pt has an appt on 12/16/24.

## 2024-12-12 ENCOUNTER — Telehealth: Payer: Self-pay | Admitting: Pharmacy Technician

## 2024-12-12 ENCOUNTER — Other Ambulatory Visit (HOSPITAL_COMMUNITY): Payer: Self-pay

## 2024-12-12 NOTE — Telephone Encounter (Signed)
 Patient Advocate Encounter   The patient was approved for a [employer] grant that will help cover the cost of Repatha  Total amount awarded, 2500.00.  Effective: 11/30/24 - 11/29/25   APW:[id number] ERW:[id number] Hmnle:[id number] PI:[id number]  [employer] ID: [id number]   Pharmacy provided with approval and processing information. Patient informed via mychart

## 2024-12-15 ENCOUNTER — Encounter: Payer: Self-pay | Admitting: Cardiology

## 2024-12-15 NOTE — Progress Notes (Unsigned)
 " Cardiology Office Note:    Date:  12/15/2024   ID:  Sean Hammond, DOB [birth year], MRN [id number]  PCP:  Clemmie Nest, MD   [facility] Providers Cardiologist:  Oneil Parchment, MD { Click to update primary MD,subspecialty MD or APP then REFRESH:1}    Referring MD: Clemmie Nest, MD   Chief complaint: Annual follow-up of CAD     History of Present Illness:   Canceled d/t weather    ROS:   Please see the history of present illness.    *** All other systems reviewed and are negative.     Past Medical History:  Diagnosis Date   Allergic rhinitis    BPH (benign prostatic hyperplasia)    urologist-- dr t. alvaro   CAD (coronary artery disease) cardiologsit--- dr parchment   a. 12-14-2011 s/p PCI with DES x2 to midLCFx. b. NSTEMI 06/2020 s/p DES to RCA and staged DES to OM1.   Chronic headaches    due to sinues   ED (erectile dysfunction)    GERD (gastroesophageal reflux disease)    Hematuria    History of acute pyelonephritis    08-11-2019   History of echocardiogram    last one 03-22-2015   mild LVH,   ef 55-60%   History of kidney stones    History of melanoma excision    non-malignant excised from ear   History of MI (myocardial infarction)    per pt remote hx yrs ago prior to 2013   History of nonmelanoma skin cancer    per pt SCC and BCC from back, head, face  excision   History of prostatitis    Hydronephrosis, right    Hyperlipidemia    Hypertension    Moderate obstructive sleep apnea    not used cpap since 2014 bought bed that head would elevate    Osteoarthritis of hand    PONV (postoperative nausea and vomiting)    Pt may have been confused after surgery in the past   Statin intolerance    Wears glasses     Past Surgical History:  Procedure Laterality Date   BONE CYST EXCISION  ~ 2004   left knee   CARDIAC CATHETERIZATION N/A 05/09/2016   Procedure: Left Heart Cath and Coronary Angiography;  Surgeon: Oneil JAYSON Parchment, MD;  Location: [facility];  Service: Cardiovascular;  Laterality: N/A;   CHOLECYSTECTOMY N/A 03/23/2015   Procedure: LAPAROSCOPIC CHOLECYSTECTOMY;  Surgeon: Lynda Leos, MD;  Location: [facility];  Service: General;  Laterality: N/A;   CORONARY ANGIOPLASTY WITH STENT PLACEMENT  12/14/11   2   CORONARY PRESSURE/FFR STUDY N/A 02/14/2023   Procedure: INTRAVASCULAR PRESSURE WIRE/FFR STUDY;  Surgeon: Verlin Lonni BIRCH, MD;  Location: [facility];  Service: Cardiovascular;  Laterality: N/A;   CORONARY STENT INTERVENTION N/A 07/01/2020   Procedure: CORONARY STENT INTERVENTION;  Surgeon: Claudene Victory ORN, MD;  Location: [facility];  Service: Cardiovascular;  Laterality: N/A;   CORONARY STENT INTERVENTION N/A 07/05/2020   Procedure: CORONARY STENT INTERVENTION;  Surgeon: Claudene Victory ORN, MD;  Location: [facility];  Service: Cardiovascular;  Laterality: N/A;   CYSTOSCOPY/RETROGRADE/URETEROSCOPY Bilateral 09/05/2019   Procedure: CYSTOSCOPY/ BILATERAL RETROGRADE/ RIGHT  URETEROSCOPY with biopsy with laser ablation of tumor;  Surgeon: Alvaro Hummer, MD;  Location: [facility];  Service: Urology;  Laterality: Bilateral;   ETHMOIDECTOMY Right 09/01/2019   Procedure: RIGHT REVISION ENDOSCOPIC ETHMOIDECTOMY;  Surgeon: Jesus Oliphant, MD;  Location: [facility];  Service: ENT;  Laterality: Right;   FRONTAL SINUSOTOMY Bilateral 07-23-2003   dr jesus   re-do   HOLMIUM LASER APPLICATION Right 09/05/2019   Procedure: HOLMIUM LASER APPLICATION;  Surgeon: Alvaro Hummer, MD;  Location: [facility];  Service: Urology;  Laterality: Right;   KNEE ARTHROSCOPY Left ` 2010   LEFT HEART CATH AND CORONARY ANGIOGRAPHY N/A 07/01/2020   Procedure: LEFT HEART CATH AND CORONARY ANGIOGRAPHY;  Surgeon: Claudene Victory ORN, MD;  Location: [facility];  Service: Cardiovascular;  Laterality: N/A;   LEFT HEART CATH AND CORONARY ANGIOGRAPHY N/A 02/14/2023   Procedure: LEFT HEART  CATH AND CORONARY ANGIOGRAPHY;  Surgeon: Verlin Lonni BIRCH, MD;  Location: [facility];  Service: Cardiovascular;  Laterality: N/A;   LEFT HEART CATHETERIZATION WITH CORONARY ANGIOGRAM N/A 12/14/2011   Procedure: LEFT HEART CATHETERIZATION WITH CORONARY ANGIOGRAM;  Surgeon: Oneil Parchment, MD;  Location: [facility] CATH [facility];  Service: Cardiovascular;  Laterality: N/A;   PERCUTANEOUS CORONARY STENT INTERVENTION (PCI-S)  12/14/2011   Procedure: PERCUTANEOUS CORONARY STENT INTERVENTION (PCI-S);  Surgeon: Oneil Parchment, MD;  Location: [facility] CATH LAB;  Service: Cardiovascular;;   ROBOT ASSITED LAPAROSCOPIC NEPHROURETERECTOMY Right 11/28/2019   Procedure: XI ROBOT ASSITED LAPAROSCOPIC NEPHROURETERECTOMY;  Surgeon: Alvaro Hummer, MD;  Location: [facility];  Service: Urology;  Laterality: Right;  3 HRS   SEPTOPLASTY WITH ETHMOIDECTOMY, AND MAXILLARY ANTROSTOMY  09-22-2002   dr jesus   and sinusotomy   TOTAL KNEE ARTHROPLASTY Left 05/09/2017   Procedure: LEFT TOTAL KNEE ARTHROPLASTY;  Surgeon: Heide Ingle, MD;  Location: [facility];  Service: Orthopedics;  Laterality: Left;    Current Medications: Active Medications[1]   Allergies:   Codeine, Crestor  [rosuvastatin ], Dilaudid  [hydromorphone  hcl], Hydrocodone , Lipitor [atorvastatin ], Percocet [oxycodone-acetaminophen ], and Pitavastatin    Social History   Socioeconomic History   Marital status: Married    Spouse name: Not on file   Number of children: Not on file   Years of education: Not on file   Highest education level: Not on file  Occupational History   Not on file  Tobacco Use   Smoking status: Former    Current packs/day: 0.00    Average packs/day: 2.0 packs/day for 12.0 years (24.0 ttl pk-yrs)    Types: Cigarettes    Start date: 09/03/1962    Quit date: 09/03/1974    Years since quitting: 50.3   Smokeless tobacco: Never  Vaping Use   Vaping status: Never Used  Substance and Sexual Activity   Alcohol use: Not Currently   Drug use: Never    Sexual activity: Not on file  Other Topics Concern   Not on file  Social History Narrative   Not on file   Social Drivers of Health   Tobacco Use: Medium Risk (10/12/2023)   Patient History    Smoking Tobacco Use: Former    Smokeless Tobacco Use: Never    Passive Exposure: Not on Actuary Strain: Not on file  Food Insecurity: Not on file  Transportation Needs: Not on file  Physical Activity: Not on file  Stress: Not on file  Social Connections: Not on file  Depression (EYV7-0): Not on file  Alcohol Screen: Not on file  Housing: Not on file  Utilities: Not on file  Health Literacy: Not on file     Family History: The patient's ***family history includes Heart attack in his mother; Hypertension in his mother.  EKGs/Labs/Other Studies Reviewed:    The following studies were reviewed today: ***  Recent Labs: No results found for requested labs within last 365 days.  Recent Lipid Panel    Component Value Date/Time   CHOL 121 09/20/2020 0904   TRIG 89 09/20/2020 0904   HDL 42 09/20/2020 0904   CHOLHDL 2.9 09/20/2020 0904   CHOLHDL 3.6 07/01/2020 0301   VLDL 10 07/01/2020 0301   LDLCALC 62 09/20/2020 0904     Risk Assessment/Calculations:   {Does this patient have ATRIAL FIBRILLATION?:[phone]}  No BP recorded.  {Refresh Note OR Click here to enter BP  :1}***         Physical Exam:    VS:  There were no vitals taken for this visit.       Wt Readings from Last 3 Encounters:  10/12/23 179 lb (81.2 kg)  04/10/23 179 lb 9.6 oz (81.5 kg)  02/14/23 181 lb (82.1 kg)     GEN: *** Well nourished, well developed in no acute distress HEENT: Normal NECK: No JVD; No carotid bruits CARDIAC: *** S1-S2 normal, RRR, no murmurs, rubs, gallops RESPIRATORY:  Clear to auscultation without rales, wheezing or rhonchi  MUSCULOSKELETAL:  No edema; No deformity  SKIN: Warm and dry NEUROLOGIC:  Alert and oriented x 3 PSYCHIATRIC:  Normal affect        Assessment & Plan   Disposition: *** Route to primary cardiologist      {Are you ordering a CV Procedure (e.g. stress test, cath, DCCV, TEE, etc)?   Press F2        :[id number]}    Medication Adjustments/Labs and Tests Ordered: Current medicines are reviewed at length with the patient today.  Concerns regarding medicines are outlined above.  No orders of the defined types were placed in this encounter.  No orders of the defined types were placed in this encounter.   There are no Patient Instructions on file for this visit.   Signed, Shimeka Bacot E Kingston Guiles, NP  12/15/2024 4:30 PM    [facility]     [1]  No outpatient medications have been marked as taking for the 12/16/24 encounter (Appointment) with Maxmillian Carsey E, NP.   Current Facility-Administered Medications for the 12/16/24 encounter (Appointment) with Clotilda Hafer E, NP  Medication   sodium chloride  flush (NS) 0.9 % injection 3 mL   "

## 2024-12-16 ENCOUNTER — Ambulatory Visit: Admitting: Emergency Medicine

## 2025-01-13 ENCOUNTER — Ambulatory Visit: Admitting: Cardiology
